# Patient Record
Sex: Female | Born: 1935 | Race: White | Hispanic: No | Smoking: Former smoker
Health system: Southern US, Community
[De-identification: ages and names within clinical notes are randomized; demographics above are authoritative.]

## PROBLEM LIST (undated history)

## (undated) DIAGNOSIS — K219 Gastro-esophageal reflux disease without esophagitis: Secondary | ICD-10-CM

## (undated) DIAGNOSIS — T8859XA Other complications of anesthesia, initial encounter: Secondary | ICD-10-CM

## (undated) DIAGNOSIS — E119 Type 2 diabetes mellitus without complications: Secondary | ICD-10-CM

## (undated) DIAGNOSIS — R911 Solitary pulmonary nodule: Secondary | ICD-10-CM

## (undated) DIAGNOSIS — I519 Heart disease, unspecified: Secondary | ICD-10-CM

## (undated) DIAGNOSIS — Z89611 Acquired absence of right leg above knee: Secondary | ICD-10-CM

## (undated) DIAGNOSIS — R413 Other amnesia: Secondary | ICD-10-CM

## (undated) DIAGNOSIS — I70229 Atherosclerosis of native arteries of extremities with rest pain, unspecified extremity: Secondary | ICD-10-CM

## (undated) DIAGNOSIS — IMO0001 Reserved for inherently not codable concepts without codable children: Secondary | ICD-10-CM

## (undated) DIAGNOSIS — S32609A Unspecified fracture of unspecified ischium, initial encounter for closed fracture: Secondary | ICD-10-CM

## (undated) DIAGNOSIS — I480 Paroxysmal atrial fibrillation: Secondary | ICD-10-CM

## (undated) DIAGNOSIS — I701 Atherosclerosis of renal artery: Secondary | ICD-10-CM

## (undated) DIAGNOSIS — J90 Pleural effusion, not elsewhere classified: Secondary | ICD-10-CM

## (undated) DIAGNOSIS — J9610 Chronic respiratory failure, unspecified whether with hypoxia or hypercapnia: Secondary | ICD-10-CM

## (undated) DIAGNOSIS — I739 Peripheral vascular disease, unspecified: Secondary | ICD-10-CM

## (undated) DIAGNOSIS — D649 Anemia, unspecified: Secondary | ICD-10-CM

## (undated) DIAGNOSIS — I714 Abdominal aortic aneurysm, without rupture, unspecified: Secondary | ICD-10-CM

## (undated) DIAGNOSIS — E782 Mixed hyperlipidemia: Secondary | ICD-10-CM

## (undated) DIAGNOSIS — T4145XA Adverse effect of unspecified anesthetic, initial encounter: Secondary | ICD-10-CM

## (undated) DIAGNOSIS — T148XXA Other injury of unspecified body region, initial encounter: Secondary | ICD-10-CM

## (undated) DIAGNOSIS — I998 Other disorder of circulatory system: Secondary | ICD-10-CM

## (undated) DIAGNOSIS — N390 Urinary tract infection, site not specified: Secondary | ICD-10-CM

## (undated) DIAGNOSIS — Z9981 Dependence on supplemental oxygen: Secondary | ICD-10-CM

## (undated) DIAGNOSIS — S22000A Wedge compression fracture of unspecified thoracic vertebra, initial encounter for closed fracture: Secondary | ICD-10-CM

## (undated) DIAGNOSIS — Z7901 Long term (current) use of anticoagulants: Secondary | ICD-10-CM

## (undated) DIAGNOSIS — I10 Essential (primary) hypertension: Secondary | ICD-10-CM

## (undated) DIAGNOSIS — M1990 Unspecified osteoarthritis, unspecified site: Secondary | ICD-10-CM

## (undated) DIAGNOSIS — J449 Chronic obstructive pulmonary disease, unspecified: Secondary | ICD-10-CM

## (undated) DIAGNOSIS — H3320 Serous retinal detachment, unspecified eye: Secondary | ICD-10-CM

## (undated) DIAGNOSIS — I5032 Chronic diastolic (congestive) heart failure: Secondary | ICD-10-CM

## (undated) DIAGNOSIS — J189 Pneumonia, unspecified organism: Secondary | ICD-10-CM

## (undated) DIAGNOSIS — I2510 Atherosclerotic heart disease of native coronary artery without angina pectoris: Secondary | ICD-10-CM

## (undated) HISTORY — PX: AORTO-FEMORAL BYPASS GRAFT: SHX885

## (undated) HISTORY — DX: Other injury of unspecified body region, initial encounter: T14.8XXA

## (undated) HISTORY — DX: Serous retinal detachment, unspecified eye: H33.20

## (undated) HISTORY — DX: Anemia, unspecified: D64.9

## (undated) HISTORY — DX: Unspecified fracture of unspecified ischium, initial encounter for closed fracture: S32.609A

## (undated) HISTORY — PX: VASCULAR SURGERY: SHX849

## (undated) HISTORY — PX: CHOLECYSTECTOMY: SHX55

## (undated) HISTORY — DX: Type 2 diabetes mellitus without complications: E11.9

## (undated) HISTORY — DX: Acquired absence of right leg above knee: Z89.611

## (undated) HISTORY — DX: Solitary pulmonary nodule: R91.1

## (undated) HISTORY — PX: FRACTURE SURGERY: SHX138

## (undated) HISTORY — PX: PATELLA FRACTURE SURGERY: SHX735

## (undated) HISTORY — DX: Heart disease, unspecified: I51.9

## (undated) HISTORY — PX: TOTAL ABDOMINAL HYSTERECTOMY: SHX209

## (undated) HISTORY — DX: Wedge compression fracture of unspecified thoracic vertebra, initial encounter for closed fracture: S22.000A

## (undated) HISTORY — DX: Abdominal aortic aneurysm, without rupture, unspecified: I71.40

## (undated) HISTORY — DX: Atherosclerotic heart disease of native coronary artery without angina pectoris: I25.10

## (undated) HISTORY — DX: Other disorder of circulatory system: I99.8

## (undated) HISTORY — PX: FEMORAL-PERONEAL BYPASS GRAFT: SHX5163

## (undated) HISTORY — DX: Atherosclerosis of native arteries of extremities with rest pain, unspecified extremity: I70.229

## (undated) HISTORY — PX: RETINAL DETACHMENT SURGERY: SHX105

## (undated) HISTORY — DX: Peripheral vascular disease, unspecified: I73.9

## (undated) HISTORY — PX: CARDIAC CATHETERIZATION: SHX172

## (undated) HISTORY — DX: Atherosclerosis of renal artery: I70.1

## (undated) HISTORY — DX: Other amnesia: R41.3

## (undated) HISTORY — DX: Abdominal aortic aneurysm, without rupture: I71.4

## (undated) HISTORY — DX: Mixed hyperlipidemia: E78.2

## (undated) HISTORY — PX: EYE SURGERY: SHX253

---

## 1996-02-18 HISTORY — PX: ABDOMINAL AORTIC ANEURYSM REPAIR: SUR1152

## 1997-06-07 ENCOUNTER — Inpatient Hospital Stay (HOSPITAL_COMMUNITY): Admission: RE | Admit: 1997-06-07 | Discharge: 1997-06-13 | Payer: Self-pay | Admitting: *Deleted

## 1997-06-17 ENCOUNTER — Inpatient Hospital Stay (HOSPITAL_COMMUNITY): Admission: EM | Admit: 1997-06-17 | Discharge: 1997-06-24 | Payer: Self-pay | Admitting: Emergency Medicine

## 1997-06-29 ENCOUNTER — Inpatient Hospital Stay (HOSPITAL_COMMUNITY): Admission: EM | Admit: 1997-06-29 | Discharge: 1997-07-05 | Payer: Self-pay | Admitting: Emergency Medicine

## 1998-02-02 ENCOUNTER — Ambulatory Visit: Admission: RE | Admit: 1998-02-02 | Discharge: 1998-02-02 | Payer: Self-pay | Admitting: Cardiovascular Disease

## 1998-04-24 ENCOUNTER — Encounter: Admission: RE | Admit: 1998-04-24 | Discharge: 1998-07-23 | Payer: Self-pay | Admitting: Emergency Medicine

## 1999-06-20 ENCOUNTER — Encounter: Admission: RE | Admit: 1999-06-20 | Discharge: 1999-06-20 | Payer: Self-pay | Admitting: Emergency Medicine

## 1999-06-20 ENCOUNTER — Encounter: Payer: Self-pay | Admitting: Emergency Medicine

## 2000-06-22 ENCOUNTER — Encounter: Payer: Self-pay | Admitting: Emergency Medicine

## 2000-06-22 ENCOUNTER — Encounter: Admission: RE | Admit: 2000-06-22 | Discharge: 2000-06-22 | Payer: Self-pay | Admitting: Emergency Medicine

## 2000-07-27 ENCOUNTER — Encounter: Payer: Self-pay | Admitting: Emergency Medicine

## 2000-07-27 ENCOUNTER — Encounter: Admission: RE | Admit: 2000-07-27 | Discharge: 2000-07-27 | Payer: Self-pay | Admitting: Emergency Medicine

## 2000-10-27 ENCOUNTER — Ambulatory Visit (HOSPITAL_COMMUNITY): Admission: RE | Admit: 2000-10-27 | Discharge: 2000-10-27 | Payer: Self-pay | Admitting: Gastroenterology

## 2001-06-23 ENCOUNTER — Encounter: Admission: RE | Admit: 2001-06-23 | Discharge: 2001-06-23 | Payer: Self-pay | Admitting: Emergency Medicine

## 2001-06-23 ENCOUNTER — Encounter: Payer: Self-pay | Admitting: Emergency Medicine

## 2002-06-28 ENCOUNTER — Encounter: Payer: Self-pay | Admitting: Emergency Medicine

## 2002-06-28 ENCOUNTER — Encounter: Admission: RE | Admit: 2002-06-28 | Discharge: 2002-06-28 | Payer: Self-pay | Admitting: Emergency Medicine

## 2002-12-16 ENCOUNTER — Ambulatory Visit (HOSPITAL_COMMUNITY): Admission: RE | Admit: 2002-12-16 | Discharge: 2002-12-16 | Payer: Self-pay | Admitting: Emergency Medicine

## 2003-07-05 ENCOUNTER — Encounter: Admission: RE | Admit: 2003-07-05 | Discharge: 2003-07-05 | Payer: Self-pay | Admitting: Emergency Medicine

## 2004-07-10 ENCOUNTER — Encounter: Admission: RE | Admit: 2004-07-10 | Discharge: 2004-07-10 | Payer: Self-pay | Admitting: Emergency Medicine

## 2004-07-18 ENCOUNTER — Encounter: Admission: RE | Admit: 2004-07-18 | Discharge: 2004-07-18 | Payer: Self-pay | Admitting: Emergency Medicine

## 2004-08-30 ENCOUNTER — Encounter (HOSPITAL_BASED_OUTPATIENT_CLINIC_OR_DEPARTMENT_OTHER): Admission: RE | Admit: 2004-08-30 | Discharge: 2004-11-28 | Payer: Self-pay | Admitting: Surgery

## 2004-12-16 ENCOUNTER — Encounter (HOSPITAL_BASED_OUTPATIENT_CLINIC_OR_DEPARTMENT_OTHER): Admission: RE | Admit: 2004-12-16 | Discharge: 2004-12-26 | Payer: Self-pay | Admitting: Surgery

## 2005-01-15 ENCOUNTER — Encounter (HOSPITAL_BASED_OUTPATIENT_CLINIC_OR_DEPARTMENT_OTHER): Admission: RE | Admit: 2005-01-15 | Discharge: 2005-01-22 | Payer: Self-pay | Admitting: Surgery

## 2005-02-17 DIAGNOSIS — H3320 Serous retinal detachment, unspecified eye: Secondary | ICD-10-CM

## 2005-02-17 HISTORY — DX: Serous retinal detachment, unspecified eye: H33.20

## 2005-06-12 ENCOUNTER — Encounter (HOSPITAL_BASED_OUTPATIENT_CLINIC_OR_DEPARTMENT_OTHER): Admission: RE | Admit: 2005-06-12 | Discharge: 2005-06-13 | Payer: Self-pay | Admitting: Internal Medicine

## 2005-07-21 ENCOUNTER — Encounter: Admission: RE | Admit: 2005-07-21 | Discharge: 2005-07-21 | Payer: Self-pay | Admitting: Emergency Medicine

## 2006-06-18 ENCOUNTER — Emergency Department (HOSPITAL_COMMUNITY): Admission: EM | Admit: 2006-06-18 | Discharge: 2006-06-19 | Payer: Self-pay | Admitting: Emergency Medicine

## 2006-06-18 HISTORY — PX: CATARACT EXTRACTION: SUR2

## 2006-06-25 ENCOUNTER — Ambulatory Visit: Payer: Self-pay | Admitting: *Deleted

## 2006-07-23 ENCOUNTER — Encounter: Admission: RE | Admit: 2006-07-23 | Discharge: 2006-07-23 | Payer: Self-pay | Admitting: Emergency Medicine

## 2006-08-05 ENCOUNTER — Ambulatory Visit: Payer: Self-pay | Admitting: Vascular Surgery

## 2006-10-16 ENCOUNTER — Ambulatory Visit (HOSPITAL_COMMUNITY): Admission: RE | Admit: 2006-10-16 | Discharge: 2006-10-16 | Payer: Self-pay | Admitting: Cardiovascular Disease

## 2006-10-30 ENCOUNTER — Ambulatory Visit (HOSPITAL_COMMUNITY): Admission: RE | Admit: 2006-10-30 | Discharge: 2006-10-30 | Payer: Self-pay | Admitting: Cardiovascular Disease

## 2006-12-31 ENCOUNTER — Ambulatory Visit: Payer: Self-pay | Admitting: *Deleted

## 2007-02-18 DIAGNOSIS — T148XXA Other injury of unspecified body region, initial encounter: Secondary | ICD-10-CM

## 2007-02-18 HISTORY — DX: Other injury of unspecified body region, initial encounter: T14.8XXA

## 2007-04-20 ENCOUNTER — Encounter: Admission: RE | Admit: 2007-04-20 | Discharge: 2007-04-20 | Payer: Self-pay | Admitting: Cardiovascular Disease

## 2007-05-07 ENCOUNTER — Encounter: Admission: RE | Admit: 2007-05-07 | Discharge: 2007-05-07 | Payer: Self-pay | Admitting: Emergency Medicine

## 2007-07-15 ENCOUNTER — Ambulatory Visit: Payer: Self-pay | Admitting: *Deleted

## 2007-07-26 ENCOUNTER — Encounter: Admission: RE | Admit: 2007-07-26 | Discharge: 2007-07-26 | Payer: Self-pay | Admitting: Emergency Medicine

## 2007-08-29 ENCOUNTER — Emergency Department (HOSPITAL_COMMUNITY): Admission: EM | Admit: 2007-08-29 | Discharge: 2007-08-29 | Payer: Self-pay | Admitting: Emergency Medicine

## 2007-12-23 ENCOUNTER — Ambulatory Visit: Payer: Self-pay | Admitting: *Deleted

## 2008-06-29 ENCOUNTER — Ambulatory Visit: Payer: Self-pay | Admitting: *Deleted

## 2008-07-28 ENCOUNTER — Encounter: Admission: RE | Admit: 2008-07-28 | Discharge: 2008-07-28 | Payer: Self-pay | Admitting: Emergency Medicine

## 2008-09-17 DIAGNOSIS — I5032 Chronic diastolic (congestive) heart failure: Secondary | ICD-10-CM

## 2008-09-17 HISTORY — DX: Chronic diastolic (congestive) heart failure: I50.32

## 2008-10-03 ENCOUNTER — Inpatient Hospital Stay (HOSPITAL_COMMUNITY): Admission: AD | Admit: 2008-10-03 | Discharge: 2008-10-13 | Payer: Self-pay | Admitting: Family Medicine

## 2008-10-03 ENCOUNTER — Ambulatory Visit: Payer: Self-pay | Admitting: Family Medicine

## 2008-10-05 ENCOUNTER — Encounter: Payer: Self-pay | Admitting: Family Medicine

## 2008-10-12 DIAGNOSIS — I2510 Atherosclerotic heart disease of native coronary artery without angina pectoris: Secondary | ICD-10-CM

## 2008-10-12 HISTORY — DX: Atherosclerotic heart disease of native coronary artery without angina pectoris: I25.10

## 2008-12-22 ENCOUNTER — Ambulatory Visit: Payer: Self-pay | Admitting: Vascular Surgery

## 2009-03-15 ENCOUNTER — Encounter: Admission: RE | Admit: 2009-03-15 | Discharge: 2009-03-15 | Payer: Self-pay | Admitting: Emergency Medicine

## 2009-06-29 ENCOUNTER — Encounter: Admission: RE | Admit: 2009-06-29 | Discharge: 2009-06-29 | Payer: Self-pay | Admitting: Gastroenterology

## 2009-07-05 ENCOUNTER — Ambulatory Visit: Payer: Self-pay | Admitting: Family Medicine

## 2009-07-05 ENCOUNTER — Inpatient Hospital Stay (HOSPITAL_COMMUNITY): Admission: EM | Admit: 2009-07-05 | Discharge: 2009-07-06 | Payer: Self-pay | Admitting: Emergency Medicine

## 2009-07-30 ENCOUNTER — Encounter: Admission: RE | Admit: 2009-07-30 | Discharge: 2009-07-30 | Payer: Self-pay | Admitting: Emergency Medicine

## 2009-07-30 LAB — HM DEXA SCAN

## 2009-08-31 ENCOUNTER — Ambulatory Visit: Payer: Self-pay | Admitting: Vascular Surgery

## 2009-12-27 ENCOUNTER — Encounter: Admission: RE | Admit: 2009-12-27 | Discharge: 2009-12-27 | Payer: Self-pay | Admitting: Emergency Medicine

## 2010-02-26 ENCOUNTER — Ambulatory Visit: Admit: 2010-02-26 | Payer: Self-pay | Admitting: Vascular Surgery

## 2010-03-10 ENCOUNTER — Encounter: Payer: Self-pay | Admitting: Emergency Medicine

## 2010-05-06 LAB — CBC
HCT: 33.5 % — ABNORMAL LOW (ref 36.0–46.0)
Hemoglobin: 11.5 g/dL — ABNORMAL LOW (ref 12.0–15.0)
Hemoglobin: 13 g/dL (ref 12.0–15.0)
MCHC: 34 g/dL (ref 30.0–36.0)
MCHC: 34.1 g/dL (ref 30.0–36.0)
MCV: 90.4 fL (ref 78.0–100.0)
Platelets: 153 10*3/uL (ref 150–400)
Platelets: 176 10*3/uL (ref 150–400)
RBC: 3.72 MIL/uL — ABNORMAL LOW (ref 3.87–5.11)
RDW: 13.9 % (ref 11.5–15.5)
RDW: 14.2 % (ref 11.5–15.5)
RDW: 14.6 % (ref 11.5–15.5)
WBC: 5.8 10*3/uL (ref 4.0–10.5)

## 2010-05-06 LAB — URINALYSIS, ROUTINE W REFLEX MICROSCOPIC
Hgb urine dipstick: NEGATIVE
Nitrite: NEGATIVE
Specific Gravity, Urine: 1.012 (ref 1.005–1.030)
pH: 6 (ref 5.0–8.0)

## 2010-05-06 LAB — GLUCOSE, CAPILLARY
Glucose-Capillary: 101 mg/dL — ABNORMAL HIGH (ref 70–99)
Glucose-Capillary: 167 mg/dL — ABNORMAL HIGH (ref 70–99)
Glucose-Capillary: 65 mg/dL — ABNORMAL LOW (ref 70–99)

## 2010-05-06 LAB — COMPREHENSIVE METABOLIC PANEL
ALT: 31 U/L (ref 0–35)
ALT: 31 U/L (ref 0–35)
Albumin: 3.2 g/dL — ABNORMAL LOW (ref 3.5–5.2)
Alkaline Phosphatase: 45 U/L (ref 39–117)
BUN: 37 mg/dL — ABNORMAL HIGH (ref 6–23)
BUN: 50 mg/dL — ABNORMAL HIGH (ref 6–23)
Calcium: 8 mg/dL — ABNORMAL LOW (ref 8.4–10.5)
Chloride: 116 mEq/L — ABNORMAL HIGH (ref 96–112)
Glucose, Bld: 82 mg/dL (ref 70–99)
Glucose, Bld: 94 mg/dL (ref 70–99)
Potassium: 4.1 mEq/L (ref 3.5–5.1)
Sodium: 136 mEq/L (ref 135–145)
Sodium: 141 mEq/L (ref 135–145)
Total Bilirubin: 0.4 mg/dL (ref 0.3–1.2)
Total Protein: 5.5 g/dL — ABNORMAL LOW (ref 6.0–8.3)
Total Protein: 6.4 g/dL (ref 6.0–8.3)

## 2010-05-06 LAB — CK TOTAL AND CKMB (NOT AT ARMC)
CK, MB: 16.4 ng/mL (ref 0.3–4.0)
CK, MB: 20.9 ng/mL (ref 0.3–4.0)
Relative Index: 8.2 — ABNORMAL HIGH (ref 0.0–2.5)
Relative Index: 8.4 — ABNORMAL HIGH (ref 0.0–2.5)
Total CK: 250 U/L — ABNORMAL HIGH (ref 7–177)

## 2010-05-06 LAB — PROTIME-INR
Prothrombin Time: 23.5 seconds — ABNORMAL HIGH (ref 11.6–15.2)
Prothrombin Time: 23.7 seconds — ABNORMAL HIGH (ref 11.6–15.2)

## 2010-05-06 LAB — DIFFERENTIAL
Basophils Absolute: 0 10*3/uL (ref 0.0–0.1)
Basophils Relative: 0 % (ref 0–1)
Eosinophils Absolute: 0 10*3/uL (ref 0.0–0.7)
Eosinophils Relative: 1 % (ref 0–5)
Lymphs Abs: 1.4 10*3/uL (ref 0.7–4.0)
Monocytes Absolute: 0.6 10*3/uL (ref 0.1–1.0)
Monocytes Relative: 9 % (ref 3–12)
Neutro Abs: 4.4 10*3/uL (ref 1.7–7.7)
Neutrophils Relative %: 68 % (ref 43–77)

## 2010-05-06 LAB — BASIC METABOLIC PANEL
BUN: 17 mg/dL (ref 6–23)
CO2: 22 mEq/L (ref 19–32)
Calcium: 8.2 mg/dL — ABNORMAL LOW (ref 8.4–10.5)
Chloride: 112 mEq/L (ref 96–112)
Creatinine, Ser: 0.92 mg/dL (ref 0.4–1.2)
Glucose, Bld: 129 mg/dL — ABNORMAL HIGH (ref 70–99)

## 2010-05-06 LAB — CARDIAC PANEL(CRET KIN+CKTOT+MB+TROPI)
CK, MB: 28.5 ng/mL (ref 0.3–4.0)
Relative Index: 7.6 — ABNORMAL HIGH (ref 0.0–2.5)
Relative Index: 8.2 — ABNORMAL HIGH (ref 0.0–2.5)
Total CK: 377 U/L — ABNORMAL HIGH (ref 7–177)

## 2010-05-06 LAB — TSH: TSH: 2.554 u[IU]/mL (ref 0.350–4.500)

## 2010-05-06 LAB — TROPONIN I: Troponin I: 0.04 ng/mL (ref 0.00–0.06)

## 2010-05-06 LAB — LIPASE, BLOOD: Lipase: 41 U/L (ref 11–59)

## 2010-05-06 LAB — CREATININE, URINE, RANDOM: Creatinine, Urine: 36.3 mg/dL

## 2010-05-06 LAB — POCT CARDIAC MARKERS: CKMB, poc: 9.5 ng/mL (ref 1.0–8.0)

## 2010-05-25 LAB — GLUCOSE, CAPILLARY
Glucose-Capillary: 100 mg/dL — ABNORMAL HIGH (ref 70–99)
Glucose-Capillary: 101 mg/dL — ABNORMAL HIGH (ref 70–99)
Glucose-Capillary: 105 mg/dL — ABNORMAL HIGH (ref 70–99)
Glucose-Capillary: 109 mg/dL — ABNORMAL HIGH (ref 70–99)
Glucose-Capillary: 113 mg/dL — ABNORMAL HIGH (ref 70–99)
Glucose-Capillary: 118 mg/dL — ABNORMAL HIGH (ref 70–99)
Glucose-Capillary: 128 mg/dL — ABNORMAL HIGH (ref 70–99)
Glucose-Capillary: 131 mg/dL — ABNORMAL HIGH (ref 70–99)
Glucose-Capillary: 135 mg/dL — ABNORMAL HIGH (ref 70–99)
Glucose-Capillary: 136 mg/dL — ABNORMAL HIGH (ref 70–99)
Glucose-Capillary: 137 mg/dL — ABNORMAL HIGH (ref 70–99)
Glucose-Capillary: 138 mg/dL — ABNORMAL HIGH (ref 70–99)
Glucose-Capillary: 148 mg/dL — ABNORMAL HIGH (ref 70–99)
Glucose-Capillary: 162 mg/dL — ABNORMAL HIGH (ref 70–99)
Glucose-Capillary: 170 mg/dL — ABNORMAL HIGH (ref 70–99)
Glucose-Capillary: 172 mg/dL — ABNORMAL HIGH (ref 70–99)
Glucose-Capillary: 175 mg/dL — ABNORMAL HIGH (ref 70–99)
Glucose-Capillary: 181 mg/dL — ABNORMAL HIGH (ref 70–99)
Glucose-Capillary: 185 mg/dL — ABNORMAL HIGH (ref 70–99)
Glucose-Capillary: 190 mg/dL — ABNORMAL HIGH (ref 70–99)
Glucose-Capillary: 207 mg/dL — ABNORMAL HIGH (ref 70–99)
Glucose-Capillary: 97 mg/dL (ref 70–99)

## 2010-05-25 LAB — BASIC METABOLIC PANEL
BUN: 25 mg/dL — ABNORMAL HIGH (ref 6–23)
BUN: 27 mg/dL — ABNORMAL HIGH (ref 6–23)
BUN: 42 mg/dL — ABNORMAL HIGH (ref 6–23)
BUN: 51 mg/dL — ABNORMAL HIGH (ref 6–23)
BUN: 75 mg/dL — ABNORMAL HIGH (ref 6–23)
CO2: 31 mEq/L (ref 19–32)
CO2: 31 mEq/L (ref 19–32)
CO2: 32 mEq/L (ref 19–32)
CO2: 32 mEq/L (ref 19–32)
CO2: 33 mEq/L — ABNORMAL HIGH (ref 19–32)
CO2: 33 mEq/L — ABNORMAL HIGH (ref 19–32)
Calcium: 9.4 mg/dL (ref 8.4–10.5)
Calcium: 9.4 mg/dL (ref 8.4–10.5)
Chloride: 100 mEq/L (ref 96–112)
Chloride: 103 mEq/L (ref 96–112)
Chloride: 106 mEq/L (ref 96–112)
Chloride: 95 mEq/L — ABNORMAL LOW (ref 96–112)
Chloride: 95 mEq/L — ABNORMAL LOW (ref 96–112)
Chloride: 97 mEq/L (ref 96–112)
Chloride: 98 mEq/L (ref 96–112)
Creatinine, Ser: 1.07 mg/dL (ref 0.4–1.2)
Creatinine, Ser: 1.14 mg/dL (ref 0.4–1.2)
Creatinine, Ser: 1.42 mg/dL — ABNORMAL HIGH (ref 0.4–1.2)
Creatinine, Ser: 2.05 mg/dL — ABNORMAL HIGH (ref 0.4–1.2)
GFR calc Af Amer: 37 mL/min — ABNORMAL LOW (ref >60)
GFR calc Af Amer: 50 mL/min — ABNORMAL LOW (ref >60)
GFR calc Af Amer: 59 mL/min — ABNORMAL LOW (ref >60)
GFR calc Af Amer: >60 mL/min (ref >60)
GFR calc non Af Amer: 42 mL/min — ABNORMAL LOW (ref >60)
GFR calc non Af Amer: 44 mL/min — ABNORMAL LOW (ref >60)
GFR calc non Af Amer: 45 mL/min — ABNORMAL LOW (ref >60)
GFR calc non Af Amer: 51 mL/min — ABNORMAL LOW (ref >60)
Glucose, Bld: 108 mg/dL — ABNORMAL HIGH (ref 70–99)
Glucose, Bld: 140 mg/dL — ABNORMAL HIGH (ref 70–99)
Glucose, Bld: 140 mg/dL — ABNORMAL HIGH (ref 70–99)
Glucose, Bld: 141 mg/dL — ABNORMAL HIGH (ref 70–99)
Potassium: 3.5 mEq/L (ref 3.5–5.1)
Potassium: 3.7 mEq/L (ref 3.5–5.1)
Potassium: 3.7 mEq/L (ref 3.5–5.1)
Potassium: 3.7 mEq/L (ref 3.5–5.1)
Potassium: 3.9 mEq/L (ref 3.5–5.1)
Potassium: 4.1 mEq/L (ref 3.5–5.1)
Potassium: 4.4 mEq/L (ref 3.5–5.1)
Sodium: 137 mEq/L (ref 135–145)
Sodium: 138 mEq/L (ref 135–145)
Sodium: 139 mEq/L (ref 135–145)
Sodium: 140 mEq/L (ref 135–145)
Sodium: 140 mEq/L (ref 135–145)
Sodium: 141 mEq/L (ref 135–145)
Sodium: 143 mEq/L (ref 135–145)

## 2010-05-25 LAB — COMPREHENSIVE METABOLIC PANEL
AST: 30 U/L (ref 0–37)
Albumin: 3 g/dL — ABNORMAL LOW (ref 3.5–5.2)
Chloride: 109 mEq/L (ref 96–112)
Creatinine, Ser: 1.34 mg/dL — ABNORMAL HIGH (ref 0.4–1.2)
GFR calc Af Amer: 46 mL/min — ABNORMAL LOW (ref >60)
Sodium: 143 mEq/L (ref 135–145)
Total Bilirubin: 0.5 mg/dL (ref 0.3–1.2)

## 2010-05-25 LAB — CBC
HCT: 32.1 % — ABNORMAL LOW (ref 36.0–46.0)
HCT: 32.3 % — ABNORMAL LOW (ref 36.0–46.0)
HCT: 36.4 % (ref 36.0–46.0)
HCT: 37.7 % (ref 36.0–46.0)
HCT: 38.9 % (ref 36.0–46.0)
Hemoglobin: 11.1 g/dL — ABNORMAL LOW (ref 12.0–15.0)
Hemoglobin: 12.2 g/dL (ref 12.0–15.0)
Hemoglobin: 12.7 g/dL (ref 12.0–15.0)
Hemoglobin: 12.8 g/dL (ref 12.0–15.0)
MCHC: 33 g/dL (ref 30.0–36.0)
MCHC: 33.3 g/dL (ref 30.0–36.0)
MCHC: 33.6 g/dL (ref 30.0–36.0)
MCHC: 33.6 g/dL (ref 30.0–36.0)
MCHC: 33.7 g/dL (ref 30.0–36.0)
MCHC: 33.8 g/dL (ref 30.0–36.0)
MCV: 94 fL (ref 78.0–100.0)
MCV: 94 fL (ref 78.0–100.0)
MCV: 94.4 fL (ref 78.0–100.0)
MCV: 94.4 fL (ref 78.0–100.0)
MCV: 94.6 fL (ref 78.0–100.0)
MCV: 95 fL (ref 78.0–100.0)
Platelets: 196 10*3/uL (ref 150–400)
Platelets: 200 10*3/uL (ref 150–400)
Platelets: 223 10*3/uL (ref 150–400)
Platelets: 230 10*3/uL (ref 150–400)
RBC: 3.4 MIL/uL — ABNORMAL LOW (ref 3.87–5.11)
RBC: 3.4 MIL/uL — ABNORMAL LOW (ref 3.87–5.11)
RDW: 13.2 % (ref 11.5–15.5)
RDW: 13.3 % (ref 11.5–15.5)
RDW: 13.7 % (ref 11.5–15.5)
WBC: 5.4 10*3/uL (ref 4.0–10.5)
WBC: 6 10*3/uL (ref 4.0–10.5)
WBC: 7.4 10*3/uL (ref 4.0–10.5)
WBC: 7.4 10*3/uL (ref 4.0–10.5)
WBC: 7.9 10*3/uL (ref 4.0–10.5)

## 2010-05-25 LAB — DIFFERENTIAL
Basophils Absolute: 0.1 10*3/uL (ref 0.0–0.1)
Eosinophils Relative: 3 % (ref 0–5)
Lymphocytes Relative: 32 % (ref 12–46)
Lymphs Abs: 1.7 10*3/uL (ref 0.7–4.0)
Monocytes Absolute: 0.5 10*3/uL (ref 0.1–1.0)

## 2010-05-25 LAB — UIFE/LIGHT CHAINS/TP QN, 24-HR UR
Alpha 1, Urine: DETECTED — AB
Free Kappa Lt Chains,Ur: 1.77 mg/dL — ABNORMAL HIGH (ref 0.04–1.51)
Free Lambda Excretion/Day: 2.04 mg/d
Free Lambda Lt Chains,Ur: 0.11 mg/dL (ref 0.08–1.01)
Gamma Globulin, Urine: DETECTED — AB
Time: 24 hours
Total Protein, Urine-Ur/day: 198 mg/d — ABNORMAL HIGH (ref 10–140)

## 2010-05-25 LAB — PROTIME-INR
INR: 1.1 (ref 0.00–1.49)
INR: 1.1 (ref 0.00–1.49)
INR: 1.1 (ref 0.00–1.49)
INR: 1.2 (ref 0.00–1.49)
Prothrombin Time: 14 seconds (ref 11.6–15.2)
Prothrombin Time: 14.5 seconds (ref 11.6–15.2)

## 2010-05-25 LAB — APTT: aPTT: 75 seconds — ABNORMAL HIGH (ref 24–37)

## 2010-05-25 LAB — BRAIN NATRIURETIC PEPTIDE
Pro B Natriuretic peptide (BNP): 419 pg/mL — ABNORMAL HIGH (ref 0.0–100.0)
Pro B Natriuretic peptide (BNP): 566 pg/mL — ABNORMAL HIGH (ref 0.0–100.0)
Pro B Natriuretic peptide (BNP): 781 pg/mL — ABNORMAL HIGH (ref 0.0–100.0)

## 2010-07-01 ENCOUNTER — Other Ambulatory Visit: Payer: Self-pay | Admitting: Emergency Medicine

## 2010-07-01 DIAGNOSIS — Z1231 Encounter for screening mammogram for malignant neoplasm of breast: Secondary | ICD-10-CM

## 2010-07-02 NOTE — Procedures (Signed)
BYPASS GRAFT EVALUATION   INDICATION:  Follow-up right femoral-to-ATA bypass graft.   HISTORY:  Diabetes:  Yes.  Cardiac:  No.  Hypertension:  Yes.  Smoking:  Quit.  Previous Surgery:  Please see above.   SINGLE LEVEL ARTERIAL EXAM                               RIGHT              LEFT  Brachial:                    157                159  Anterior tibial:             94                 114  Posterior tibial:            108                101  Peroneal:  Ankle/brachial index:        0.68               0.72   PREVIOUS ABI:  Date: 07/15/2007  RIGHT:  0.67  LEFT:  0.52   LOWER EXTREMITY BYPASS GRAFT DUPLEX EXAM:   DUPLEX:  Patent left femoral-to-ATA bypass graft with no evidence of  focal stenosis.   IMPRESSION:  1. Patent left femoral-to-anterior tibial artery  bypass with no      evidence of focal stenosis.  2. Moderately abnormal ABI with monophasic Doppler waveform noted in      bilateral legs.  3. Status post left femoral-to-anterior tibial artery bypass graft.   ___________________________________________  P. Liliane Bade, M.D.   MG/MEDQ  D:  12/23/2007  T:  12/23/2007  Job:  [id number]

## 2010-07-02 NOTE — Procedures (Signed)
BYPASS GRAFT EVALUATION   INDICATION:  Followup evaluation of right fem anterior tibial artery  bypass graft.  Patient complains of bilateral lower extremity weakness  which has occurred for years   HISTORY:  Diabetes:  Yes  Cardiac:  No  Hypertension:  Yes  Smoking:  Quit 10 years ago  Previous Surgery:  Right Fem-ATA BPG with Gore-Tex on 06/29/1997 by Dr.  Madilyn Fireman.  Many revisions have been done since.  Left iliac artery stent by  Dr. Allyson Sabal      SINGLE LEVEL ARTERIAL EXAM                               RIGHT              LEFT  Brachial:                    132                138  Anterior tibial:             80                 86  Posterior tibial:            64                 66  Peroneal:  Ankle/brachial index:        0.58               0.63   PREVIOUS ABI:  Date: 06/25/06  RIGHT:  0.59  LEFT:  0.64   LOWER EXTREMITY BYPASS GRAFT DUPLEX EXAM:   DUPLEX:  1. Right CFA patent without evidence of stenosis  2. Patent right lower extremity bypass graft without evidence of      stenosis with velocities ranging from 25 cm /s to 69 cm/s .  3. A 50% -79% stenosis in Native ATA approximately 2.31 cm past distal      anastomosis   IMPRESSION:  1. ABIs stable bilaterally.  2. Patent right lower extremity bypass graft without evidence of      stenosis.  3. A 50% -79% stenosis in Native ATA approximately 2.31 cm past distal      anastomosis.   ___________________________________________  P. Liliane Bade, M.D.   PB/MEDQ  D:  12/31/2006  T:  01/01/2007  Job:  [id number]

## 2010-07-02 NOTE — Discharge Summary (Signed)
Suzanne [name], QUESENBERRY NO.:  [id number]   MEDICAL RECORD NO.:  [id number]          PATIENT TYPE:  INP   LOCATION:  4734                         FACILITY:  [facility]   PHYSICIAN:  Nanetta Batty, M.D.   DATE OF BIRTH:  1921-08-07   DATE OF ADMISSION:  10/03/2008  DATE OF DISCHARGE:  10/13/2008                               DISCHARGE SUMMARY   DISCHARGE DIAGNOSES:  1. Congestive heart failure, acute on chronic systolic failure,      improved at discharge, discharge weight is 73.6 kg.  2. Cardiomyopathy, ejection fraction this admission 20-25% which was      down from 40-45% in February 2010.  3. Moderate coronary artery disease by catheterization with 30% left      anterior descending, 50% right coronary artery, 40% circumflex.  4. Chronic obstructive pulmonary disease.  5. Renal insufficiency, creatinine 1.14 at discharge, her creatinine      peaked this admission at 2.0 with diuresis.  6. Treated hypertension.  7. Type 2 non-insulin-dependent diabetes.  8. Known peripheral vascular disease with previous bifemoral bypass      grafting in renal artery narrowing, the patient is on chronic      Coumadin for vascular disease.   HOSPITAL COURSE:  Suzanne [name] is an 77 year old female presented with  increasing dyspnea for the last couple of weeks.  She has baseline COPD.  Last assessment of her EF was by echo in February 2010 and her EF was 40-  45%.  She had been seen by Dr. Allyson Sabal in the past.  Renal Dopplers as an  outpatient showed 60-99% bilateral renal artery narrowing.  She was  admitted to telemetry and started on IV diuretics.  Her creatinine on  admission was 1.18.  Her BNP was 679.  She was actually admitted by Dr.  Leveda Anna in Teaching Service.  She was on Coumadin, her INR was 2.1 on  admission.  Her Coumadin was held.  Echocardiogram revealed new LV  dysfunction with an EF of 20-25%.  Unfortunately, her creatinine had  risen.  Her ACE inhibitor and diuretics were  held.  Dr. Allyson Sabal felt she  needed a diagnostic catheterization for her new LV dysfunction.  This  was done on October 12, 2008.  EF was 35-40% at cath.  There was no  severe coronary blockages.  Plan is for continued medical therapy.  Dr.  Allyson Sabal will see her in the office in followup.  We resumed her ARB at  discharge, but held off on her diuretic for now.  She has been  instructed on a low-sodium diet.  Coumadin will be resumed without  heparin or Lovenox crossover.   DISCHARGE MEDICATIONS:  Please see Med Rec sheet, we did hold off on her  diuretic.   DISCHARGE LABORATORY DATA:  Sodium is 141, potassium 4.4, BUN 27, and  creatinine 1.07.  White count 7.4, hemoglobin 10.9, hematocrit 32.3, and  platelets 197.  Chest x-ray on the 19th shows a right pleural effusion.  CT of the chest without contrast on the 17th showed ascending aortic  ectasia, moderate right layering  pleural effusion.  She does have 9 x 5  mm right middle lobe pulmonary nodule that will need followup and a T12  compression fracture.   DISPOSITION:  The patient is discharged in stable condition.   Her discharge medications were as follows:  1. Tylenol 2 q.4 p.r.n.  2. Aspirin 81 mg a day.  3. Toprol-XL 25 mg 1/2 tablet a day.  4. Actos 45 mg a day.  5. Boniva 2.5 mg monthly.  6. Combivent inhaler 1 puff q.i.d. p.r.n.  7. Crestor 20 mg a day.  8. Iron 65 mg a day.  9. Losartan 50 mg a day.  10.Nexium 40 mg a day.  11.Os-Cal twice daily.  12.Fish oil b.i.d.  13.Coumadin 2.5 mg a day.  14.For now, she will hold her Lasix.   DISPOSITION:  The patient is discharged in stable condition.  She will  need followup CT without contrast in 3-4 months.  She will need a  followup BMP in the office.  She will see Dr. Allyson Sabal in about a week or  two.      Abelino Derrick, P.A.      Nanetta Batty, M.D.  Electronically Signed    LKK/MEDQ  D:  10/13/2008  T:  10/14/2008  Job:  [id number]   cc:   [facility]

## 2010-07-02 NOTE — Procedures (Signed)
BYPASS GRAFT EVALUATION   INDICATION:  Followup evaluation of right lower extremity bypass graft.  The patient complains of bilateral claudication at 100 feet, left >  right.  The patient states her left leg has worsened.   HISTORY:  Diabetes:  Yes.  Cardiac:  No.  Hypertension:  Yes.  Smoking:  Quit.  Previous Surgery:  Right fem-ATA BPG with Gore-Tex on 06/29/1997 by Dr.  Madilyn Fireman.  Many revisions done since.  Left iliac stent by Dr. Allyson Sabal.   SINGLE LEVEL ARTERIAL EXAM                               RIGHT              LEFT  Brachial:                    180                178  Anterior tibial:             100                88  Posterior tibial:            120                94  Peroneal:  Ankle/brachial index:        0.67               0.52   PREVIOUS ABI:  Date:  12/31/2006  RIGHT:  0.58  LEFT:  0.63   LOWER EXTREMITY BYPASS GRAFT DUPLEX EXAM:   DUPLEX:     1. Patent right CFA without evidence of stenosis.     2. Patent right fem-ATA BPG with velocities ranging from 23 cm per        second - 56 cm per second without evidence of stenosis.     3. Patent distal native ATA, however, unable to obtain the increased        PSV of 216 cm/sec, approximately 2.3 cm past the distal        anastomosis as noted on previous study.   IMPRESSION:  1. Stable right ABI.  2. Mild drop in left ABI.  3. Patent right lower extremity bypass graft without evidence of      stenosis.  4. Patent distal native ATA, however, unable to obtain a 50-79%      stenosis approximately 2.31 cm past the distal anastomosis as noted      on previous study due to technical difficulty.     ___________________________________________  P. Liliane Bade, M.D.   PB/MEDQ  D:  07/15/2007  T:  07/15/2007  Job:  [id number]

## 2010-07-02 NOTE — Assessment & Plan Note (Signed)
OFFICE VISIT   AIREN, DALES  DOB:  Aug 26, 1921                                       08/31/2009  FAOZH#:[id number]   CHIEF COMPLAINT:  Follow-up right leg fem-pop bypass.   HISTORY OF PRESENT ILLNESS:  Patient is an 77 year old woman with a  right femoral anterior tibial bypass on 06/29/1997 by Dr. Madilyn Fireman.  She  follows up today with ABIs.  She has had no issues with either right or  left lower extremity except for that side that she has had a little bit  of heaviness in the left leg.  She has no night or rest pain, and she is  ambulating, doing all normal activities.  ABIs showed were 0.61 on the  right and 0.73 on the left, which were stable.  However, on her previous  study in November 2010, the graft was patent without evidence of  stenosis.   On physical exam, this is a well-developed, well-nourished woman in no  acute distress.  Her heart rate is 69.  Her blood pressure is 165/81.  Her sats are 97%.  She had right DP peroneal Doppler signal and a left  AT and peroneal Doppler signal.  She and no DP or PT on the left.  Both  feet were warm and pink.  The right looked slightly more well-perfused  than the left.   ASSESSMENT:  Stable ankle brachial indices, right femoropopliteal  bypass.   PLAN:  Have her follow up per protocol with ABIs.  We reviewed signs and  symptoms of ischemia.   Della Goo, PA-C   [place]. Edilia Bo, M.D.  Electronically Signed   RR/MEDQ  D:  08/31/2009  T:  08/31/2009  Job:  [id number]

## 2010-07-02 NOTE — Cardiovascular Report (Signed)
NAME:  Suzanne [name], Suzanne [name] NO.:  [id number]   MEDICAL RECORD NO.:  [id number]          PATIENT TYPE:  INP   LOCATION:  4734                         FACILITY:  [facility]   PHYSICIAN:  Nanetta Batty, M.D.   DATE OF BIRTH:  1921/06/05   DATE OF PROCEDURE:  DATE OF DISCHARGE:                            CARDIAC CATHETERIZATION   HISTORY OF PRESENT ILLNESS:  [name] is an 77 year old Caucasian  female who I have been taking care of for almost 20 years.  She has had  a history of aortobifemoral.  Her other problems include COPD.  She was  admitted with dyspnea and pleural effusion.  She has been on Coumadin  anticoagulation.  She was diuresed and treated aggressively for  bronchitis.  Two-D echo showed an EF in the 25% range, which was  significantly reduced from her prior transthoracic echo we have done  earlier this year.  Because of this, she presents now for diagnostic  coronary arteriography while her INR is subtherapeutic to define anatomy  to rule out ischemic etiology.   DESCRIPTION OF PROCEDURE:  The patient was brought to the Second Floor  [facility] Cardiac Cath Lab in a postabsorptive state.  Her left groin  was prepped and shaved in the usual sterile fashion.  Xylocaine 1% was  used for local anesthesia.  A 5-French sheath was inserted into the left  femoral artery using standard Seldinger technique.  The 5-French right  and left Judkins diagnostic catheter, as well as 5-French pigtail  catheter were used for selective coronary angiography and left  ventriculography respectively.  Visipaque dye was used for the entirety  of the case.  Retrograde aortic, left ventricular and pullback blood  pressures were recorded.   HEMODYNAMICS:  1. Aortic systolic pressure 171, diastolic pressure 70.  2. Left ventricular systolic pressure 173, and diastolic pressure 27.   SELECTIVE CORONARY ANGIOGRAPHY:  1. Left main normal.  2. LAD; LAD had 30% segmental proximal  stenosis.  3. Circumflex; the circumflex had 30% and 40% tandem mid stenosis in      the AV groove.  4. Right coronary artery; dominant with 50% mid and 60% stenosis of      the genu of the vessel.  5. Left ventriculography; RAO left ventriculogram was performed using      20 mL of Visipaque dye 10 mL per second.  The overall LVEF appeared      to be approximately 40% with moderate global hypokinesia.   IMPRESSION:  [name] has noncritical CAD with moderate LV dysfunction.  Continued medical therapy will be recommended.  She will be gently  hydrated.  She was treated with Mucomyst.  Renal function and BMP will  be closely followed.  A total of 55 mL of contrast was used during the  case.  The patient left the lab in stable condition.      Nanetta Batty, M.D.  Electronically Signed     JB/MEDQ  D:  10/12/2008  T:  10/12/2008  Job:  [id number]   cc:   Second Floor [facility]  [facility]  Brett Canales A. Cleta Alberts, M.D.

## 2010-07-02 NOTE — Procedures (Signed)
BYPASS GRAFT EVALUATION   INDICATION:  Followup right lower extremity bypass graft.   HISTORY:  Diabetes:  Yes.  Cardiac:  No.  Hypertension:  Yes.  Smoking:  Previous.  Previous Surgery:  Right femoral to anterior tibial artery bypass graft  on 06/30/2007 by Dr. Madilyn Fireman with revascularizations per the patient.   SINGLE LEVEL ARTERIAL EXAM                               RIGHT              LEFT  Brachial:                    126                138  Anterior tibial:             80                 101  Posterior tibial:                               90  Peroneal:                    84  Ankle/brachial index:        0.61               0.73   PREVIOUS ABI:  Date:  12/22/2008  RIGHT:  0.62  LEFT:  0.74   LOWER EXTREMITY BYPASS GRAFT DUPLEX EXAM:   DUPLEX:  Monophasic Doppler waveforms noted throughout the right lower  extremity bypass graft with no increase in velocities.   IMPRESSION:  1. Patent right femoral to anterior tibial artery bypass graft with no      evidence of stenosis.  2. Stable bilateral ankle brachial indices.         ___________________________________________  Larina Earthly, M.D.   CH/MEDQ  D:  08/31/2009  T:  08/31/2009  Job:  [id number]

## 2010-07-02 NOTE — Procedures (Signed)
BYPASS GRAFT EVALUATION   INDICATION:  Followup evaluation of right lower extremity femoral to  anterior tibial bypass graft.   HISTORY:  Diabetes:  Yes.  Cardiac:  No.  Hypertension:  Yes.  Smoking:  Quit.  Previous Surgery:  Right femoral to anterior tibial bypass graft with  Gore-Tex on 06/29/1997 by Dr. Madilyn Fireman.  Nine revisions done since then.   SINGLE LEVEL ARTERIAL EXAM                               RIGHT              LEFT  Brachial:                    166                153  Anterior tibial:             103                122  Posterior tibial:            41                 123  Peroneal:  Ankle/brachial index:        0.62               0.74   PREVIOUS ABI:  Date:  06/29/2008  RIGHT:  0.59  LEFT:  0.56   LOWER EXTREMITY BYPASS GRAFT DUPLEX EXAM:   DUPLEX:  The right femoral to anterior tibial bypass graft appears  patent, however, there are increased velocities at distal anastomosis of  142 cm/s.   IMPRESSION:  1. Patent right femoral to anterior tibial bypass graft with increased      velocities of 142 cm/s at distal anastomosis.  2. Stable ankle brachial indices bilaterally as compared to previous      study.        ___________________________________________  Larina Earthly, M.D.   CB/MEDQ  D:  12/22/2008  T:  12/22/2008  Job:  [id number]

## 2010-07-02 NOTE — H&P (Signed)
Suzanne [name], Suzanne [name] NO.:  [id number]   MEDICAL RECORD NO.:  [id number]          PATIENT TYPE:  INP   LOCATION:  4733                         FACILITY:  [facility]   PHYSICIAN:  Santiago Bumpers. Hensel, M.D.DATE OF BIRTH:  1921-07-09   DATE OF ADMISSION:  10/03/2008  DATE OF DISCHARGE:                              HISTORY & PHYSICAL   PRIMARY CARE PHYSICIAN:  Brett Canales A. Cleta Alberts, MD at [facility].   CHIEF COMPLAINT:  Shortness of breath.   HISTORY OF PRESENT ILLNESS:  This is an 77 year old female who presents  with progressive shortness of breath x6 weeks.  The patient has a  history of COPD with a baseline level of shortness of breath.  Two  months ago, she changed inhalers to Combivent which originally improved  her dyspnea.  Starting approximately 6 weeks ago, her shortness of  breath began to worsen until the point that currently she is unable to  walk across a room without stopping to catch her breath.  She denies  fevers, chest pain, headaches, or changes in her vision.  She endorses a  cough productive of thick yellow sputum, which has increased over the  past 6 weeks.  Chest x-ray at her primary care physician's office showed  a right-sided pleural effusion.  The patient endorses___152__ no known  sick contacts.  An echo from 2005 showed an ejection fraction of 52%.  The patient denies any new swelling in any extremities.   REVIEW OF SYSTEMS:  A 10-point review of systems was negative except as  noted in the HPI.   PAST MEDICAL HISTORY:  1. COPD.  2. Hypertension.  3. Diabetes.  4. Hyperlipidemia.  5. Chronic renal insufficiency.  6. Peripheral vascular disease.  7. AAA.   PAST SURGICAL HISTORY:  1. Aortobifem bypass.  2. Abdominal aortic aneurysm repair with graft.  3. Cataract surgery.   MEDICATIONS:  1. Actos.  2. Cozaar.  3. Crestor.  4. Coumadin.  5. Boniva.  6. Combivent.  7. Nexium.  8. Fish oil.  9. Iron.  10.Calcium with vitamin D.  11.Vitamin B6.   SOCIAL HISTORY:  The patient is an 77 year old retired widowed female  who lives alone.  She has close family support who frequently check on  her and with whom speaks to on a daily basis.  The patient is a prior  tobacco user.  Smoking approximately 1 pack a day for greater than 30  years.  The patient endorses occasional alcohol use and denies any  recreational drug use.   FAMILY HISTORY:  The patient has a family history positive for diabetes,  hypertension, and prostate cancer in her father.  Additionally, her  siblings have history of peripheral vascular disease.   PHYSICAL EXAMINATION:  GENERAL:  No apparent distress.  CV:  Regular rate and rhythm.  No bruits.  No murmurs, rubs, or gallops.  PULMONARY:  Decreased breath sounds over the right middle and lower  lobes, dullness to percussion over the right base, decreased tactile  fremitus on the right side.  There is some use of accessory muscles.  The patient  was able to speak in complete sentences.  ABDOMEN:  Soft, nontender, and nondistended with positive bowel sounds.  EXTREMITIES:  Bilateral radial pulses 1+.  Absent pulses bilaterally in  the dorsalis pedis and posterior tibialis pulses.  1+ edema bilaterally  in the lower extremities.   ASSESSMENT AND PLAN:  This is an 77 year old female who presents with  shortness of breath with new right pleural effusion.  Plan for  thoracentesis with fluid studies to guide workup.  1. Shortness of breath, pleural effusion, and chronic obstructive      pulmonary disease.  Chest x-ray showed a new right pleural      effusion.  The patient will need a thoracentesis, however, she is      currently on Coumadin.  At this point, we will get a chest CT, hold      the Coumadin, and check an INR.  If the patient's INR is elevated,      we will consider giving vitamin K and checking INR in the morning.      We will check a BNP.  We will get an echo, and we will send urine       for Bence Jones proteins.  A.m. labs include CBC, CMET, and a      PT/INR.  2. Diabetes.  The patient's diabetes is well controlled on Actos at      home.  Last hemoglobin A1c was less than 6.  During this admission,      the patient will be on sliding scale insulin, sensitive intensity      with a.c. and h.s. coverage.  3. Peripheral vascular disease.  The patient is on Coumadin at home      for mixed arterial and venous insufficiency.  We will hold Coumadin      for the thoracentesis.  We will bridge with heparin 5000 units      t.i.d.  4. Chronic renal insufficiency.  Last creatinine was 1.6 in June 2010.      We will hydrate the patient prior to her CT and we will hold the CT      for creatinine greater than 1.5.  The patient has an appointment as      an outpatient with nephrologist.  We will consider consult during      this admission.  5. Hyperlipidemia.  The patient's last fasting lipid panel was within      goal ranges.  We will continue treatment with Crestor.  6. Hypertension.  The patient's blood pressure is stable.  We will      continue Cozaar.  7. FEN/GI:  The patient may have a carbohydrate modified diet.  She      will receive half normal saline at 75 mL/h.  8. Prophylaxis.  Heparin 5000 units subcu t.i.d. and Protonix 40 mg.      Continue all other home medications.  9. Disposition, discharged to home.  Pending workup and treatment of      pleural effusion.      William A. Leveda Anna, M.D.  Electronically Signed    WAH/MEDQ  D:  10/03/2008  T:  10/04/2008  Job:  [id number]

## 2010-07-02 NOTE — Procedures (Signed)
BYPASS GRAFT EVALUATION   INDICATION:  Follow up evaluation of lower extremity bypass graft.   HISTORY:  Diabetes:  Yes.  Cardiac:  No.  Hypertension:  Yes.  Smoking:  Quit.  Previous Surgery:  Right fem to anterior tibial bypass graft with Bethanie Dicker on 06/29/1997 by Dr. Madilyn Fireman.  Many revisions done since.   SINGLE LEVEL ARTERIAL EXAM                               RIGHT              LEFT  Brachial:                    137                154  Anterior tibial:             91                 82  Posterior tibial:            51                 86  Peroneal:  Ankle/brachial index:        0.59               0.56   PREVIOUS ABI:  Date: 12/23/2007  RIGHT:  0.68  LEFT:  0.72   LOWER EXTREMITY BYPASS GRAFT DUPLEX EXAM:   DUPLEX:  1. Biphasic duplex waveform noted within graft and native artery.  2. Increased velocity of 199 cm/s noted at the distal native artery.   IMPRESSION:  1. Patent right femoral to anterior tibial bypass graft with no      evidence of focal stenosis.  2. Bilateral lower extremity ABIs suggest moderate to severe arterial      disease.   ___________________________________________  P. Liliane Bade, M.D.   AC/MEDQ  D:  06/29/2008  T:  06/29/2008  Job:  [id number]

## 2010-07-05 NOTE — Consult Note (Signed)
NAME:  Suzanne [name], Suzanne [name] NO.:  [id number]   MEDICAL RECORD NO.:  [id number]           PATIENT TYPE:   LOCATION:                                 FACILITY:   PHYSICIAN:  Theresia Majors. Tanda Rockers, M.D.     DATE OF BIRTH:   DATE OF CONSULTATION:  09/02/2004  DATE OF DISCHARGE:                                   CONSULTATION   REASON FOR CONSULTATION:  Suzanne [name] is an 77 year old lady referred for the  evaluation of a nonhealing ulcer on her left ankle.   IMPRESSION:  Combined arterial and stasis ulcer post-traumatic.   RECOMMENDATIONS:  Proceed with debridement and mild multilayer compression  with topical debriding agent. Follow the patient at weekly intervals to  assess her response to therapy. The patient may ultimately require a  comprehensive vascular evaluation which may include arteriography and  revascularization.   SUBJECTIVE:  Suzanne [name] is an 77 year old lady who noted a bump and a  breakdown of skin approximately 4 weeks ago. She has been treated with  various over-the-counter medications and there has not been significant  improvement. On the contrary, there has been some subtle deterioration with  increased drainage, redness, and pain. She was referred [facility].   PAST MEDICAL HISTORY:  Is remarkable for diabetes. She has had a previous  femoral to tibial bypass on the right and has been followed comprehensively  by Dr. Liliane Bade of [facility]. Her current medication list include allergies to  CODEINE and to __________. She is a hypertensive and takes Avapro 300 mg a  day, Avandia 8 mg a day, Crestor 20 mg at bedtime, Coumadin 2.5 mg a day,  Prevacid 30 mg twice a day, Lasix 20 mg a day, Atrovent inhalers two puffs a  day, iron 28 mg a day.   FAMILY HISTORY:  Is positive for hypertension and vascular disease.   SOCIALLY:  She is a retired widow, lives in [place] in close association  with her children.   REVIEW OF SYSTEMS:  Discloses that she is relatively active her diabetes.  Her diabetes is of the adult onset variant and has not been problematic in  the past. She specifically denies angina pectoris. She has had an abdominal  aortic aneurysm repaired electively without sequelae. She denies GI or GU  complaints.The remainder of the review of systems is negative.   PHYSICAL EXAMINATION:  GENERAL:  She is alert, oriented, in no acute  distress.  HEENT:  Exam was clear.  NECK:  Supple. Trachea is midline. Thyroid is nonpalpable.  LUNGS:  Clear.  ABDOMEN:  Soft with a well-healed midline incision.  EXTREMITIES:  The femoral pulses are faintly palpable bilaterally. There is  a bounding graft in the subcutaneous area on the anterolateral aspect of the  right lower extremity. There are no nutritional changes apparent in the  right lower extremity. On the left there is a frank ulceration over the  lateral malleolus with a thickened eschar and a moderate drainage. This  wound was debrided. Photographs were taken. There  are no pedal pulses in the  left lower extremity. There are marked changes of venous telangiectasias and  spider angiomata. Neurologically the patient's protective sensation is  preserved.  SKIN:  There no dominant skin lesions. There is no regional adenopathy.   DISCUSSION:  This patient's wound was debrided in [facility] and mild  compressive dressing applied to decrease the negative effects of the  moderate edema. We will see her in 1 week to assess her response to this  therapy. She may in fact require the aforementioned evaluation for arterial  disease. The patient does have an appointment be seen by Dr. Madilyn Fireman within  the next month, at which time we would anticipate that he would proceed with  reevaluation of the left lower extremity vasculature.           ______________________________  Theresia Majors Tanda Rockers, M.D.     Cephus Slater  D:  09/02/2004  T:  09/02/2004   Job:  [id number]   cc:   Balinda Quails, M.D.  [address]  [place]  [place] [address]   Stan Head. Cleta Alberts, M.D.  [address]  [place]  [place] [address]  Fax: [phone]

## 2010-07-05 NOTE — Procedures (Signed)
[place]  Patient:    Suzanne [name], Suzanne [name] Visit Number: [id number] MRN: [id number]          Service Type: END Location: ENDO Attending Physician:  Orland Mustard Dictated by:   Llana Aliment. Randa Evens, M.D. Proc. Date: 10/27/00 Admit Date:  10/27/2000   CC:         Collene Gobble, MD   Procedure Report  PROCEDURE PERFORMED:  Colonoscopy.  ENDOSCOPIST:  Llana Aliment. Randa Evens, M.D.  MEDICATIONS USED:  Fentanyl 50 mcg, Versed 5 mg IV.  INSTRUMENT:  Pediatric video colonoscope.  INDICATIONS:  Colon cancer screening.  DESCRIPTION OF PROCEDURE:  The procedure had been explained to the patient and consent obtained.  With the patient in the left lateral decubitus position, the Olympus pediatric video colonoscope was inserted and advanced under direct visualization.  The prep was excellent.  Using abdominal pressure and position change, we were able to reach the cecum.  The ileocecal valve and appendiceal orifice were seen.  The scope was withdrawn.  The cecum, ascending colon, hepatic flexure, transverse colon, splenic flexure, descending and sigmoid colon were seen well upon withdrawal.  Moderate diverticulosis seen in the sigmoid colon.  No polyps.  Scope withdrawn, patient tolerated the procedure well.  ASSESSMENT: 1. Mild to moderate diverticulosis. 2. No polyp.  PLAN:  Routine follow-up. Dictated by:   Llana Aliment. Randa Evens, M.D. Attending Physician:  Orland Mustard DD:  10/27/00 TD:  10/27/00 Job: [id number]

## 2010-08-01 ENCOUNTER — Ambulatory Visit
Admission: RE | Admit: 2010-08-01 | Discharge: 2010-08-01 | Disposition: A | Discharge status: Home / Self Care | Payer: Medicare Other | Source: Ambulatory Visit | Attending: Emergency Medicine | Admitting: Emergency Medicine

## 2010-08-01 DIAGNOSIS — Z1231 Encounter for screening mammogram for malignant neoplasm of breast: Secondary | ICD-10-CM

## 2010-08-01 LAB — HM MAMMOGRAPHY

## 2010-11-14 LAB — POCT I-STAT, CHEM 8
BUN: 45 — ABNORMAL HIGH
Calcium, Ion: 1.19
Chloride: 103
Creatinine, Ser: 1.5 — ABNORMAL HIGH
Glucose, Bld: 199 — ABNORMAL HIGH
HCT: 37
Potassium: 4.2

## 2010-11-14 LAB — POCT CARDIAC MARKERS
CKMB, poc: 1.7
CKMB, poc: 2.6
Operator id: 234501
Troponin i, poc: <0.05
Troponin i, poc: <0.05

## 2010-11-14 LAB — CBC
HCT: 35.2 — ABNORMAL LOW
Hemoglobin: 11.8 — ABNORMAL LOW
MCV: 92.4
RBC: 3.81 — ABNORMAL LOW
WBC: 9.8

## 2010-11-14 LAB — URINALYSIS, ROUTINE W REFLEX MICROSCOPIC
Bilirubin Urine: NEGATIVE
Nitrite: NEGATIVE
Specific Gravity, Urine: 1.022
Urobilinogen, UA: 0.2

## 2010-11-14 LAB — DIFFERENTIAL
Eosinophils Absolute: 0.1
Eosinophils Relative: 1
Lymphocytes Relative: 12
Lymphs Abs: 1.1
Monocytes Absolute: 0.5
Monocytes Relative: 6

## 2010-11-14 LAB — URINE MICROSCOPIC-ADD ON

## 2010-11-14 LAB — PROTIME-INR: Prothrombin Time: 21 — ABNORMAL HIGH

## 2010-12-17 ENCOUNTER — Encounter: Payer: Self-pay | Admitting: Emergency Medicine

## 2010-12-17 DIAGNOSIS — R413 Other amnesia: Secondary | ICD-10-CM | POA: Insufficient documentation

## 2010-12-17 DIAGNOSIS — R911 Solitary pulmonary nodule: Secondary | ICD-10-CM | POA: Insufficient documentation

## 2010-12-17 DIAGNOSIS — D649 Anemia, unspecified: Secondary | ICD-10-CM | POA: Insufficient documentation

## 2010-12-17 DIAGNOSIS — I2510 Atherosclerotic heart disease of native coronary artery without angina pectoris: Secondary | ICD-10-CM | POA: Insufficient documentation

## 2010-12-17 DIAGNOSIS — I701 Atherosclerosis of renal artery: Secondary | ICD-10-CM | POA: Insufficient documentation

## 2010-12-17 DIAGNOSIS — I509 Heart failure, unspecified: Secondary | ICD-10-CM | POA: Insufficient documentation

## 2010-12-17 DIAGNOSIS — M810 Age-related osteoporosis without current pathological fracture: Secondary | ICD-10-CM | POA: Insufficient documentation

## 2010-12-17 DIAGNOSIS — I739 Peripheral vascular disease, unspecified: Secondary | ICD-10-CM | POA: Insufficient documentation

## 2010-12-17 DIAGNOSIS — E782 Mixed hyperlipidemia: Secondary | ICD-10-CM | POA: Insufficient documentation

## 2011-02-16 ENCOUNTER — Inpatient Hospital Stay (HOSPITAL_COMMUNITY)
Admission: AD | Admit: 2011-02-16 | Discharge: 2011-02-19 | DRG: 194 | Disposition: A | Discharge status: Home / Self Care | Payer: Medicare Other | Source: Ambulatory Visit | Attending: Family Medicine | Admitting: Family Medicine

## 2011-02-16 ENCOUNTER — Inpatient Hospital Stay (HOSPITAL_COMMUNITY): Payer: Medicare Other

## 2011-02-16 ENCOUNTER — Ambulatory Visit (INDEPENDENT_AMBULATORY_CARE_PROVIDER_SITE_OTHER): Payer: Medicare Other

## 2011-02-16 DIAGNOSIS — I509 Heart failure, unspecified: Secondary | ICD-10-CM | POA: Diagnosis present

## 2011-02-16 DIAGNOSIS — Z888 Allergy status to other drugs, medicaments and biological substances status: Secondary | ICD-10-CM

## 2011-02-16 DIAGNOSIS — R413 Other amnesia: Secondary | ICD-10-CM | POA: Diagnosis present

## 2011-02-16 DIAGNOSIS — E782 Mixed hyperlipidemia: Secondary | ICD-10-CM

## 2011-02-16 DIAGNOSIS — I2510 Atherosclerotic heart disease of native coronary artery without angina pectoris: Secondary | ICD-10-CM

## 2011-02-16 DIAGNOSIS — Z7901 Long term (current) use of anticoagulants: Secondary | ICD-10-CM

## 2011-02-16 DIAGNOSIS — Z794 Long term (current) use of insulin: Secondary | ICD-10-CM

## 2011-02-16 DIAGNOSIS — D649 Anemia, unspecified: Secondary | ICD-10-CM

## 2011-02-16 DIAGNOSIS — I5032 Chronic diastolic (congestive) heart failure: Secondary | ICD-10-CM | POA: Diagnosis present

## 2011-02-16 DIAGNOSIS — IMO0002 Reserved for concepts with insufficient information to code with codable children: Secondary | ICD-10-CM

## 2011-02-16 DIAGNOSIS — R911 Solitary pulmonary nodule: Secondary | ICD-10-CM | POA: Diagnosis present

## 2011-02-16 DIAGNOSIS — Z87891 Personal history of nicotine dependence: Secondary | ICD-10-CM

## 2011-02-16 DIAGNOSIS — I739 Peripheral vascular disease, unspecified: Secondary | ICD-10-CM | POA: Diagnosis present

## 2011-02-16 DIAGNOSIS — E785 Hyperlipidemia, unspecified: Secondary | ICD-10-CM | POA: Diagnosis present

## 2011-02-16 DIAGNOSIS — R0602 Shortness of breath: Secondary | ICD-10-CM

## 2011-02-16 DIAGNOSIS — J441 Chronic obstructive pulmonary disease with (acute) exacerbation: Secondary | ICD-10-CM | POA: Diagnosis present

## 2011-02-16 DIAGNOSIS — J189 Pneumonia, unspecified organism: Principal | ICD-10-CM | POA: Diagnosis present

## 2011-02-16 DIAGNOSIS — I10 Essential (primary) hypertension: Secondary | ICD-10-CM | POA: Diagnosis present

## 2011-02-16 DIAGNOSIS — M810 Age-related osteoporosis without current pathological fracture: Secondary | ICD-10-CM | POA: Diagnosis present

## 2011-02-16 DIAGNOSIS — I701 Atherosclerosis of renal artery: Secondary | ICD-10-CM | POA: Diagnosis present

## 2011-02-16 DIAGNOSIS — E119 Type 2 diabetes mellitus without complications: Secondary | ICD-10-CM | POA: Diagnosis present

## 2011-02-16 DIAGNOSIS — Z79899 Other long term (current) drug therapy: Secondary | ICD-10-CM

## 2011-02-16 DIAGNOSIS — R0902 Hypoxemia: Secondary | ICD-10-CM

## 2011-02-16 DIAGNOSIS — Z86718 Personal history of other venous thrombosis and embolism: Secondary | ICD-10-CM

## 2011-02-16 HISTORY — DX: Pneumonia, unspecified organism: J18.9

## 2011-02-16 HISTORY — DX: Chronic obstructive pulmonary disease, unspecified: J44.9

## 2011-02-16 LAB — COMPREHENSIVE METABOLIC PANEL
Albumin: 3.2 g/dL — ABNORMAL LOW (ref 3.5–5.2)
Alkaline Phosphatase: 54 U/L (ref 39–117)
BUN: 37 mg/dL — ABNORMAL HIGH (ref 6–23)
CO2: 25 mEq/L (ref 19–32)
Chloride: 102 mEq/L (ref 96–112)
Glucose, Bld: 383 mg/dL — ABNORMAL HIGH (ref 70–99)
Potassium: 4.7 mEq/L (ref 3.5–5.1)
Total Bilirubin: 0.2 mg/dL — ABNORMAL LOW (ref 0.3–1.2)

## 2011-02-16 LAB — CBC
HCT: 37.4 % (ref 36.0–46.0)
Hemoglobin: 12.2 g/dL (ref 12.0–15.0)
RBC: 3.95 MIL/uL (ref 3.87–5.11)
WBC: 7.4 10*3/uL (ref 4.0–10.5)

## 2011-02-16 LAB — PROTIME-INR: Prothrombin Time: 16.6 seconds — ABNORMAL HIGH (ref 11.6–15.2)

## 2011-02-16 MED ORDER — WARFARIN SODIUM 2.5 MG PO TABS
2.5000 mg | ORAL_TABLET | ORAL | Status: DC
  Administered 2011-02-16: 2.5 mg via ORAL
  Filled 2011-02-16: qty 1

## 2011-02-16 MED ORDER — ALBUTEROL SULFATE (5 MG/ML) 0.5% IN NEBU: 2.5000 mg | INHALATION_SOLUTION | RESPIRATORY_TRACT | Status: DC | PRN

## 2011-02-16 MED ORDER — SODIUM CHLORIDE 0.9 % IJ SOLN
3.0000 mL | Freq: Two times a day (BID) | INTRAMUSCULAR | Status: DC
  Administered 2011-02-16 – 2011-02-19 (×6): 3 mL via INTRAVENOUS

## 2011-02-16 MED ORDER — ROSUVASTATIN CALCIUM 20 MG PO TABS
20.0000 mg | ORAL_TABLET | Freq: Every day | ORAL | Status: DC
  Administered 2011-02-16 – 2011-02-18 (×3): 20 mg via ORAL
  Filled 2011-02-16 (×4): qty 1

## 2011-02-16 MED ORDER — PANTOPRAZOLE SODIUM 40 MG PO TBEC
40.0000 mg | DELAYED_RELEASE_TABLET | Freq: Every day | ORAL | Status: DC
  Administered 2011-02-16 – 2011-02-19 (×4): 40 mg via ORAL
  Filled 2011-02-16 (×4): qty 1

## 2011-02-16 MED ORDER — FUROSEMIDE 20 MG PO TABS
20.0000 mg | ORAL_TABLET | ORAL | Status: DC
  Administered 2011-02-17: 20 mg via ORAL
  Filled 2011-02-16: qty 1

## 2011-02-16 MED ORDER — INSULIN ASPART 100 UNIT/ML ~~LOC~~ SOLN
0.0000 [IU] | Freq: Three times a day (TID) | SUBCUTANEOUS | Status: DC
  Administered 2011-02-17 (×3): 5 [IU] via SUBCUTANEOUS
  Administered 2011-02-18: 2 [IU] via SUBCUTANEOUS
  Administered 2011-02-18: 3 [IU] via SUBCUTANEOUS
  Administered 2011-02-18 – 2011-02-19 (×3): 5 [IU] via SUBCUTANEOUS
  Filled 2011-02-16: qty 3

## 2011-02-16 MED ORDER — SODIUM CHLORIDE 0.9 % IJ SOLN: 3.0000 mL | INTRAMUSCULAR | Status: DC | PRN

## 2011-02-16 MED ORDER — LOSARTAN POTASSIUM 50 MG PO TABS
100.0000 mg | ORAL_TABLET | Freq: Every day | ORAL | Status: DC
  Administered 2011-02-17 – 2011-02-19 (×3): 100 mg via ORAL
  Filled 2011-02-16 (×3): qty 2

## 2011-02-16 MED ORDER — DOXYCYCLINE HYCLATE 100 MG PO TABS
100.0000 mg | ORAL_TABLET | Freq: Two times a day (BID) | ORAL | Status: DC
  Administered 2011-02-16: 100 mg via ORAL
  Filled 2011-02-16: qty 1

## 2011-02-16 MED ORDER — VITAMIN B-6 100 MG PO TABS
100.0000 mg | ORAL_TABLET | Freq: Every day | ORAL | Status: DC
  Administered 2011-02-17 – 2011-02-19 (×3): 100 mg via ORAL
  Filled 2011-02-16 (×3): qty 1

## 2011-02-16 MED ORDER — SODIUM CHLORIDE 0.9 % IV SOLN: 250.0000 mL | INTRAVENOUS | Status: DC | PRN

## 2011-02-16 MED ORDER — METOPROLOL SUCCINATE ER 25 MG PO TB24
25.0000 mg | ORAL_TABLET | Freq: Every day | ORAL | Status: DC
  Administered 2011-02-16 – 2011-02-18 (×3): 25 mg via ORAL
  Filled 2011-02-16 (×3): qty 1

## 2011-02-16 NOTE — H&P (Signed)
Family Medicine Teaching [facility] Admission History and Physical  Patient name: Suzanne [name] Medical record number: [id number] Date of birth: 04-May-1921 Age: 77 y.o. Gender: female  Primary Care Provider:  [facility]- Dr. Cleta Alberts  Chief Complaint: SOB  History of Present Illness: Suzanne [name] is a 77 y.o. year old female presenting with viral symptoms of cough, increased mucous production, and body aches x 1 week.  + sick contacts over christmas holiday with similar symptoms.  Has h/o copd.  SOB, cough, and mucous production seemed to increase today so pt when to urgent care for evaluation.  Pt found to have bilateral pneumonia.  Pulse ox in low 90's.  Due to increased wob and h/o lung disease, pt was direct admitted due for further inpatient workup and treatment.    Patient Active Problem List  Diagnoses  . Type II or unspecified type diabetes mellitus without mention of complication, not stated as uncontrolled  . PVD (peripheral vascular disease)  . Mixed hyperlipidemia  . Renal artery stenosis  . Memory loss, short term  . Pulmonary nodule  . CAD (coronary artery disease)  . CHF (congestive heart failure)  . Anemia  . Osteoporosis   Past Medical History: Past Medical History  Diagnosis Date  . Type II or unspecified type diabetes mellitus without mention of complication, not stated as uncontrolled   . PVD (peripheral vascular disease)   . Mixed hyperlipidemia   . Renal artery stenosis   . Compression fx, thoracic spine     T12  . Ischium fracture   . Memory loss, short term   . Pulmonary nodule   . CAD (coronary artery disease)   . CHF (congestive heart failure)   . Anemia     Past Surgical History: Past Surgical History  Procedure Date  . Aorta surgery     aortic anyuersm surgery.   . Vessel surgery     in right leg- unsure of what type of surgery    Social History: History   Social History  . Marital Status: Widowed    Spouse Name: N/A   Number of Children: N/A  . Years of Education: N/A   Social History Main Topics  . Smoking status: Former Smoker    Quit date: 02/16/1997  . Smokeless tobacco: Not on file  . Alcohol Use: No  . Drug Use: No  . Sexually Active: Not on file   Social History Narrative   Lives by self, cares for self, drives.      Family History: Family History  Problem Relation Age of Onset  . Tuberculosis Mother   . Cancer Father     Allergies: Allergies  Allergen Reactions  . Codeine Other (See Comments)    hallucinations  . Lyrica Other (See Comments)    unknown   No current facility-administered medications on file prior to encounter.   Current Outpatient Prescriptions on File Prior to Encounter  Medication Sig Dispense Refill  . albuterol-ipratropium (COMBIVENT) 18-103 MCG/ACT inhaler Inhale 2 puffs into the lungs every 4 (four) hours as needed. For wheezing      . budesonide-formoterol (SYMBICORT) 160-4.5 MCG/ACT inhaler Inhale 2 puffs into the lungs 2 (two) times daily.        . calcium-vitamin D (OSCAL WITH D) 500-200 MG-UNIT per tablet Take 1 tablet by mouth 2 (two) times daily.        [name] esomeprazole (NEXIUM) 40 MG capsule Take 40 mg by mouth daily before breakfast.        .  furosemide (LASIX) 20 MG tablet Take 20 mg by mouth every other day.       . ibandronate (BONIVA) 150 MG tablet Take 150 mg by mouth every 30 (thirty) days. Take in the morning with a full glass of water, on an empty stomach, and do not take anything else by mouth or lie down for the next 30 min.       . insulin glargine (LANTUS SOLOSTAR) 100 UNIT/ML injection Inject 16 Units into the skin at bedtime.        [name] losartan (COZAAR) 100 MG tablet Take 100 mg by mouth daily.        . metoprolol succinate (TOPROL-XL) 25 MG 24 hr tablet Take 25 mg by mouth daily.        . Omega-3 Fatty Acids (FISH OIL PO) Take 1,200 mg by mouth 2 (two) times daily.        [name] pyridOXINE (VITAMIN B-6) 100 MG tablet Take 100 mg by mouth  daily.        . rosuvastatin (CRESTOR) 20 MG tablet Take 20 mg by mouth daily.        [name] warfarin (COUMADIN) 2.5 MG tablet Take 2.5 mg by mouth See admin instructions. On Monday and Friday, take 1.25mg  (one half tablet). On Tuesday, Wednesday, Thursday, Saturday, and Sunday, take 2.5mg  (one tablet)       Review Of Systems: Per HPI with the following additions: negative except for ROS positive in HPI Otherwise 12 point review of systems was performed and was unremarkable.  Physical Exam: Pulse: 81  Blood Pressure: 137/66 RR: 20   O2: 96 on 2 L [place] Temp: 97.9  General: alert and cooperative HEENT: [place], AT Heart: S1, S2 normal, no murmur, rub or gallop, regular rate and rhythm- distant Lungs: clear in upper lobes, but wheezing with exhalation in bases bilateral.  +Coarse lung sounds. + air movement.  Abdomen: abdomen is soft without significant tenderness, masses, organomegaly or guarding Extremities: extremities normal, atraumatic, no cyanosis or edema Skin:no rashes Neurology: mental status, speech normal, alert and oriented x3 and reflexes normal and symmetric  Labs and Imaging: Lab Results  Component Value Date/Time   NA 137 02/16/2011 10:27 PM   K 4.7 02/16/2011 10:27 PM   CL 102 02/16/2011 10:27 PM   CO2 25 02/16/2011 10:27 PM   BUN 37* 02/16/2011 10:27 PM   CREATININE 1.30* 02/16/2011 10:27 PM   GLUCOSE 383* 02/16/2011 10:27 PM   Lab Results  Component Value Date   WBC 7.4 02/16/2011   HGB 12.2 02/16/2011   HCT 37.4 02/16/2011   MCV 94.7 02/16/2011   PLT 157 02/16/2011   CXR- pending   Assessment and Plan: Suzanne [name] is a 77 y.o. year old female presenting with cough, increased mucous production, body aches and SOB 1. Pneumonia- Per urgent care report-from Dr. Georgiana Shore- pt has bilateral pneumonia.-- repeat CXR pending - could not get disc to work in order to see films that were sent over from urgent care. Will treat with avelox po daily. Will give scheduled albuterol and  atovent.  O2 to keep sats greater than 88%.  May also have component of COPD exacerbation.  Will not give steroids at this time- did get solumedrol as outpatient today.  Follow pt clinical exam.  Consider start of prednisone tomorrow am.  Pt requesting no IV.  Will try to manage with po medications only.  If no improvement by tomorrow may need to place IV access.  2. CHF- has h/o chf.  Pro BNP pending.  No lower ext edema.  No crackles on lung exam.  Will f/up cxr and bnp.  Will add lasix if indicated.    3. DM-  Will cover with SSI since pt po intake has been decreased the past couple of days.  Will monitor CBG's closely and modify insulin regimen as needed  4. HTN- continue home regiemn- lasix 20mg  po daily, cozaar 100mg  po daily, toprol XL 25mg  po daily,   5. Hyperlipidemia- continue crestor- per home regimen  6. H/o dvt- continue coumadin per home regimen, pharmacy consult, INR decreased- pt missed 2 doses of home meds during past week.   7. FEN/GI: regular diet, po's only, no IV fluids.  No IV access- per pt's request.   8. Prophylaxis: coumadin  9. Disposition: pending clinical improvement.

## 2011-02-17 ENCOUNTER — Encounter (HOSPITAL_COMMUNITY): Payer: Self-pay | Admitting: Family Medicine

## 2011-02-17 DIAGNOSIS — I5023 Acute on chronic systolic (congestive) heart failure: Secondary | ICD-10-CM

## 2011-02-17 DIAGNOSIS — J441 Chronic obstructive pulmonary disease with (acute) exacerbation: Secondary | ICD-10-CM

## 2011-02-17 LAB — GLUCOSE, CAPILLARY
Glucose-Capillary: 281 mg/dL — ABNORMAL HIGH (ref 70–99)
Glucose-Capillary: 252 mg/dL — ABNORMAL HIGH (ref 70–99)

## 2011-02-17 LAB — HEMOGLOBIN A1C: Mean Plasma Glucose: 148 mg/dL — ABNORMAL HIGH (ref <117)

## 2011-02-17 LAB — PROTIME-INR
INR: 1.36 (ref 0.00–1.49)
Prothrombin Time: 17 seconds — ABNORMAL HIGH (ref 11.6–15.2)

## 2011-02-17 MED ORDER — IPRATROPIUM BROMIDE 0.02 % IN SOLN
0.5000 mg | Freq: Four times a day (QID) | RESPIRATORY_TRACT | Status: DC
  Administered 2011-02-17: 0.5 mg via RESPIRATORY_TRACT
  Filled 2011-02-17: qty 2.5

## 2011-02-17 MED ORDER — IPRATROPIUM BROMIDE 0.02 % IN SOLN: 0.5000 mg | Freq: Four times a day (QID) | RESPIRATORY_TRACT | Status: DC | PRN

## 2011-02-17 MED ORDER — ALBUTEROL SULFATE (5 MG/ML) 0.5% IN NEBU
2.5000 mg | INHALATION_SOLUTION | RESPIRATORY_TRACT | Status: DC
  Administered 2011-02-17 – 2011-02-18 (×4): 2.5 mg via RESPIRATORY_TRACT
  Filled 2011-02-17 (×4): qty 0.5

## 2011-02-17 MED ORDER — INSULIN GLARGINE 100 UNIT/ML ~~LOC~~ SOLN
5.0000 [IU] | Freq: Every day | SUBCUTANEOUS | Status: DC
  Administered 2011-02-17: 5 [IU] via SUBCUTANEOUS
  Filled 2011-02-17: qty 3

## 2011-02-17 MED ORDER — WARFARIN SODIUM 2.5 MG PO TABS
2.5000 mg | ORAL_TABLET | Freq: Once | ORAL | Status: AC
  Administered 2011-02-17: 2.5 mg via ORAL
  Filled 2011-02-17: qty 1

## 2011-02-17 MED ORDER — PREDNISONE 50 MG PO TABS
60.0000 mg | ORAL_TABLET | Freq: Every day | ORAL | Status: DC
  Administered 2011-02-18 – 2011-02-19 (×2): 60 mg via ORAL
  Filled 2011-02-17 (×3): qty 1

## 2011-02-17 MED ORDER — FUROSEMIDE 40 MG PO TABS
40.0000 mg | ORAL_TABLET | Freq: Every day | ORAL | Status: DC
  Administered 2011-02-18: 40 mg via ORAL
  Filled 2011-02-17: qty 1

## 2011-02-17 MED ORDER — MOXIFLOXACIN HCL 400 MG PO TABS
400.0000 mg | ORAL_TABLET | Freq: Every day | ORAL | Status: DC
  Administered 2011-02-17 – 2011-02-19 (×3): 400 mg via ORAL
  Filled 2011-02-17 (×3): qty 1

## 2011-02-17 MED ORDER — ALBUTEROL SULFATE (5 MG/ML) 0.5% IN NEBU: 2.5000 mg | INHALATION_SOLUTION | RESPIRATORY_TRACT | Status: DC | PRN

## 2011-02-17 MED ORDER — IPRATROPIUM BROMIDE 0.02 % IN SOLN
0.5000 mg | Freq: Three times a day (TID) | RESPIRATORY_TRACT | Status: DC
  Administered 2011-02-17 – 2011-02-19 (×7): 0.5 mg via RESPIRATORY_TRACT
  Filled 2011-02-17 (×7): qty 2.5

## 2011-02-17 MED ORDER — ALBUTEROL SULFATE (5 MG/ML) 0.5% IN NEBU
2.5000 mg | INHALATION_SOLUTION | Freq: Three times a day (TID) | RESPIRATORY_TRACT | Status: DC
  Administered 2011-02-17: 2.5 mg via RESPIRATORY_TRACT
  Filled 2011-02-17: qty 0.5

## 2011-02-17 MED ORDER — ALBUTEROL SULFATE (5 MG/ML) 0.5% IN NEBU
2.5000 mg | INHALATION_SOLUTION | RESPIRATORY_TRACT | Status: DC
  Administered 2011-02-17: 2.5 mg via RESPIRATORY_TRACT
  Filled 2011-02-17: qty 0.5

## 2011-02-17 MED ORDER — INSULIN GLARGINE 100 UNIT/ML ~~LOC~~ SOLN
10.0000 [IU] | Freq: Every day | SUBCUTANEOUS | Status: DC
  Administered 2011-02-18: 10 [IU] via SUBCUTANEOUS

## 2011-02-17 NOTE — Progress Notes (Signed)
UR Completed.  Suzanne [name] 02/17/2011 [phone]  

## 2011-02-17 NOTE — Progress Notes (Signed)
PGY-1 Daily Progress Note Family Medicine Teaching Service D. [place] Rolene Arbour, MD Service Pager: [phone]  Patient name: Suzanne [name]  Medical record [id number] Date of birth:[birth year] Age: 77 y.o. Gender: female  LOS: 1 day   Subjective: Feeling better. moderated PO intake.  Objective:  Vitals: Temp:  [97.6 F (36.4 C)-97.9 F (36.6 C)] 97.6 F (36.4 C) (12/31 0635) Pulse Rate:  [76-90] 90  (12/31 0940) Resp:  [19-22] 19  (12/31 0635) BP: (137-164)/(66-76) 164/76 mmHg (12/31 0940) SpO2:  [95 %-97 %] 97 % (12/31 1131) Weight:  [156 lb 15.5 oz (71.2 kg)] 156 lb 15.5 oz (71.2 kg) (12/30 2005)  Intake/Output Summary (Last 24 hours) at 02/17/11 1401 Last data filed at 02/17/11 1336  Gross per 24 hour  Intake    480 ml  Output    500 ml  Net    -20 ml   Physical Exam: General: alert and cooperative  HEENT: normal and moist mucosa. Heart: S1, S2 normal, no murmur, rub or gallop, regular rate and rhythm- distant  Lungs: clear in upper lobes, but wheezing with exhalation in bases bilateral. +Coarse lung sounds. + air movement.  Abdomen: abdomen is soft without significant tenderness, masses, organomegaly or guarding  Extremities: extremities normal, atraumatic, no cyanosis or edema  Skin:no rashes  Neurology: mental status, speech normal, alert and oriented x3 and reflexes normal and symmetric   Labs and imaging:  CBC  Lab 02/16/11 2227  WBC 7.4  HGB 12.2  HCT 37.4  PLT 157   BMET  Lab 02/16/11 2227  NA 137  K 4.7  CL 102  CO2 25  BUN 37*  CREATININE 1.30*  LABGLOM --  GLUCOSE 383*  CALCIUM 9.6   PROTIME-INR     Status: Abnormal   Collection Time   02/16/11 10:27 PM      Component Value Range   Prothrombin Time 16.6 (*) 11.6 - 15.2 (seconds)   INR 1.32  0.00 - 1.49   PRO B NATRIURETIC PEPTIDE     Status: Abnormal   Collection Time   02/16/11 11:36 PM      Component Value Range   Pro B Natriuretic peptide (BNP) 725.5 (*) 0 - 450 (pg/mL)    PROTIME-INR     Status: Abnormal   Collection Time   02/17/11  5:00 AM      Component Value Range   Prothrombin Time 17.0 (*) 11.6 - 15.2 (seconds)   INR 1.36  0.00 - 1.49    X-ray Chest Pa And Lateral  02/17/2011  .  IMPRESSION: Emphysematous changes and scattered fibrosis in the lungs.  No evidence of active consolidation.  No significant interval change.  Original Report Authenticated By: Marlon Pel, M.D.   Medications: Medication Dose Route Frequency  . albuterol (PROVENTIL) (5 MG/ML) 0.5% nebulizer solution 2.5 mg  2.5 mg Nebulization Q4H  . furosemide (LASIX) tablet 20 mg  20 mg Oral QODAY  . insulin aspart (novoLOG) injection 0-9 Units  0-9 Units Subcutaneous TID WC  . insulin glargine (LANTUS) injection 5 Units  5 Units Subcutaneous Daily  . ipratropium (ATROVENT) nebulizer solution 0.5 mg  0.5 mg Nebulization TID  . losartan (COZAAR) tablet 100 mg  100 mg Oral Daily  . metoprolol succinate (TOPROL-XL) 24 hr tablet 25 mg  25 mg Oral Daily  . moxifloxacin (AVELOX) tablet 400 mg  400 mg Oral q1800  . pantoprazole (PROTONIX) EC tablet 40 mg  40 mg Oral Daily  . predniSONE (  DELTASONE) tablet 60 mg  60 mg Oral Q breakfast  . pyridOXINE (VITAMIN B-6) tablet 100 mg  100 mg Oral Daily  . rosuvastatin (CRESTOR) tablet 20 mg  20 mg Oral QHS  . warfarin (COUMADIN) tablet 2.5 mg  2.5 mg Oral Once   Assessment and Plan:  Suzanne [name] is a 77 y.o. year old female presenting with cough, increased mucous production, body aches and SOB  1. Respiratory: possible COPD exacerbation after a viral illness.   Pt requesting no IV. Will try to manage with po medications only. If no improvement by tomorrow may need to place IV access.  -Continue albuterol nebs an Avelox; started Prednisone. 2. CHF- has h/o chf. Pro BNP WNL (less than 900). No lower ext edema. No crackles on lung exam. Pending  ECHO. -Will add lasix if indicated.  3. DM- Will cover with SSI since pt po intake has been  decreased the past couple of days. Glucose today 200's. At home with lantus 16 units. We will  Start slowly with 5 units and increase pending her CBG's.   4. HTN- continue home regiemn- lasix 20mg  po daily, cozaar 100mg  po daily, toprol XL 25mg  po daily,  5. Hyperlipidemia- continue crestor- per home regimen  6. H/o dvt- continue coumadin per home regimen, pharmacy consult, INR decreased- pt missed 2 doses of home meds during past week.  7. FEN/GI: regular diet, po's only, no IV fluids. No IV access- per pt's request.  8. Prophylaxis: coumadin  9. Disposition: pending clinical improvement.   D. [place] Rolene Arbour, MD PGY1, [facility] Medicine Teaching Service Pager [phone] 02/17/2011

## 2011-02-17 NOTE — Progress Notes (Signed)
ANTICOAGULATION CONSULT NOTE - Follow Up Consult  Pharmacy Consult for Coumadin Indication: hx DVT  Allergies  Allergen Reactions  . Codeine Other (See Comments)    hallucinations  . Lyrica Other (See Comments)    unknown    Patient Measurements: Weight: 156 lb 15.5 oz (71.2 kg)  Vital Signs: Temp: 97.6 F (36.4 C) (12/31 0635) Temp src: Oral (12/31 0635) BP: 164/76 mmHg (12/31 0940) Pulse Rate: 90  (12/31 0940)  Labs:  Basename 02/17/11 0500 02/16/11 2227  HGB -- 12.2  HCT -- 37.4  PLT -- 157  APTT -- --  LABPROT 17.0* 16.6*  INR 1.36 1.32  HEPARINUNFRC -- --  CREATININE -- 1.30*  CKTOTAL -- --  CKMB -- --  TROPONINI -- --   CrCl is unknown because there is no height on file for the current visit.   Medications:  Scheduled:    . albuterol  2.5 mg Nebulization Q4H  . furosemide  20 mg Oral QODAY  . insulin aspart  0-9 Units Subcutaneous TID WC  . insulin glargine  5 Units Subcutaneous Daily  . ipratropium  0.5 mg Nebulization TID  . losartan  100 mg Oral Daily  . metoprolol succinate  25 mg Oral Daily  . moxifloxacin  400 mg Oral q1800  . pantoprazole  40 mg Oral Daily  . predniSONE  60 mg Oral Q breakfast  . pyridOXINE  100 mg Oral Daily  . rosuvastatin  20 mg Oral QHS  . sodium chloride  3 mL Intravenous Q12H  . warfarin  2.5 mg Oral Once  . warfarin  2.5 mg Oral ONCE-1800  . DISCONTD: albuterol  2.5 mg Nebulization Q4H  . DISCONTD: albuterol  2.5 mg Nebulization TID  . DISCONTD: doxycycline  100 mg Oral Q12H  . DISCONTD: ipratropium  0.5 mg Nebulization Q6H  . DISCONTD: warfarin  2.5 mg Oral See admin instructions    Assessment: 77 yo F on Coumadin fo hx DVT. INR below goal on admit due to several missed doses. Patient also on Avelox which may increase INR.  Patient was given Coumadin 2.5 mg 12/30 at 23:58 and 2.5 mg this am at 01:15.  Goal of Therapy:  INR 2-3   Plan:  1. Coumadin 2.5 mg po x1 tonight 2. INR daily  Loura Back  Danielle 02/17/2011,2:22 PM

## 2011-02-17 NOTE — Progress Notes (Signed)
*  PRELIMINARY RESULTS* Echocardiogram 2D Echocardiogram has been performed.  Clide Deutscher RDCS 02/17/2011, 9:43 AM

## 2011-02-17 NOTE — H&P (Signed)
I interviewed and examined this patient and discussed the care plan with Dr. Aviva Signs and the [facility] team and agree with assessment and plan as documented in the admission note for today.    Ingra Rother A. Sheffield Slider, MD Family Medicine Teaching Service Attending  02/17/2011 11:32 AM

## 2011-02-17 NOTE — Progress Notes (Signed)
Pt lungs with crackles.  O2 sat 97% on 2L.  Dr. Sondra Come made aware.  Instructed that she will f/u.  Will  Cont. To monitor.

## 2011-02-17 NOTE — Progress Notes (Signed)
ANTICOAGULATION CONSULT NOTE - Initial Consult  Pharmacy Consult for warfarin Indication: history of DVT  Allergies  Allergen Reactions  . Codeine Other (See Comments)    hallucinations  . Lyrica Other (See Comments)    unknown    Patient Measurements: Weight: 156 lb 15.5 oz (71.2 kg) Adjusted Body Weight:   Vital Signs: Temp: 97.9 F (36.6 C) (12/30 2238) Temp src: Oral (12/30 2238) BP: 137/66 mmHg (12/30 2238) Pulse Rate: 81  (12/30 2238)  Labs:  Basename 02/16/11 2227  HGB 12.2  HCT 37.4  PLT 157  APTT --  LABPROT 16.6*  INR 1.32  HEPARINUNFRC --  CREATININE 1.30*  CKTOTAL --  CKMB --  TROPONINI --   CrCl is unknown because there is no height on file for the current visit.  Medical History: Past Medical History  Diagnosis Date  . Type II or unspecified type diabetes mellitus without mention of complication, not stated as uncontrolled   . PVD (peripheral vascular disease)   . Mixed hyperlipidemia   . Renal artery stenosis   . Compression fx, thoracic spine     T12  . Ischium fracture   . Memory loss, short term   . Pulmonary nodule   . CAD (coronary artery disease)   . CHF (congestive heart failure)   . Anemia   . COPD (chronic obstructive pulmonary disease)   . Pneumonia     Medications:  Prescriptions prior to admission  Medication Sig Dispense Refill  . albuterol-ipratropium (COMBIVENT) 18-103 MCG/ACT inhaler Inhale 2 puffs into the lungs every 4 (four) hours as needed. For wheezing      . budesonide-formoterol (SYMBICORT) 160-4.5 MCG/ACT inhaler Inhale 2 puffs into the lungs 2 (two) times daily.        . calcium-vitamin D (OSCAL WITH D) 500-200 MG-UNIT per tablet Take 1 tablet by mouth 2 (two) times daily.        [name] esomeprazole (NEXIUM) 40 MG capsule Take 40 mg by mouth daily before breakfast.        . ferrous sulfate 325 (65 FE) MG tablet Take 325 mg by mouth daily.        . furosemide (LASIX) 20 MG tablet Take 20 mg by mouth every other day.        . ibandronate (BONIVA) 150 MG tablet Take 150 mg by mouth every 30 (thirty) days. Take in the morning with a full glass of water, on an empty stomach, and do not take anything else by mouth or lie down for the next 30 min.       . insulin glargine (LANTUS SOLOSTAR) 100 UNIT/ML injection Inject 16 Units into the skin at bedtime.        [name] losartan (COZAAR) 100 MG tablet Take 100 mg by mouth daily.        . metoprolol succinate (TOPROL-XL) 25 MG 24 hr tablet Take 25 mg by mouth daily.        . Omega-3 Fatty Acids (FISH OIL PO) Take 1,200 mg by mouth 2 (two) times daily.        [name] pyridOXINE (VITAMIN B-6) 100 MG tablet Take 100 mg by mouth daily.        . rosuvastatin (CRESTOR) 20 MG tablet Take 20 mg by mouth daily.        [name] warfarin (COUMADIN) 2.5 MG tablet Take 2.5 mg by mouth See admin instructions. On Monday and Friday, take 1.25mg  (one half tablet). On Tuesday, Wednesday, Thursday, Saturday, and Sunday, take  2.5mg  (one tablet)        Assessment: 89 yof with history of DVT on coumadin. INR subtherapeutic but patient has missed at least 2 days of medication due to her illness. Will resume coumadin tonight and check INR daily Goal of Therapy:  INR 2-3   Plan:  Warfarin 2.5mg  tonight x1 INR daily   Severiano Gilbert 02/17/2011,12:53 AM

## 2011-02-18 ENCOUNTER — Inpatient Hospital Stay (HOSPITAL_COMMUNITY): Payer: Medicare Other

## 2011-02-18 LAB — BASIC METABOLIC PANEL
CO2: 29 mEq/L (ref 19–32)
Calcium: 9.6 mg/dL (ref 8.4–10.5)
Creatinine, Ser: 1.34 mg/dL — ABNORMAL HIGH (ref 0.50–1.10)
Glucose, Bld: 179 mg/dL — ABNORMAL HIGH (ref 70–99)

## 2011-02-18 LAB — CBC
Hemoglobin: 12 g/dL (ref 12.0–15.0)
MCH: 30.1 pg (ref 26.0–34.0)
MCHC: 31.8 g/dL (ref 30.0–36.0)
MCV: 94.5 fL (ref 78.0–100.0)
RBC: 3.99 MIL/uL (ref 3.87–5.11)

## 2011-02-18 LAB — GLUCOSE, CAPILLARY
Glucose-Capillary: 233 mg/dL — ABNORMAL HIGH (ref 70–99)
Glucose-Capillary: 263 mg/dL — ABNORMAL HIGH (ref 70–99)
Glucose-Capillary: 196 mg/dL — ABNORMAL HIGH (ref 70–99)

## 2011-02-18 MED ORDER — ALBUTEROL SULFATE (5 MG/ML) 0.5% IN NEBU
2.5000 mg | INHALATION_SOLUTION | Freq: Three times a day (TID) | RESPIRATORY_TRACT | Status: DC
  Administered 2011-02-18 – 2011-02-19 (×4): 2.5 mg via RESPIRATORY_TRACT
  Filled 2011-02-18 (×4): qty 0.5

## 2011-02-18 MED ORDER — METHYLPREDNISOLONE SODIUM SUCC 40 MG IJ SOLR
40.0000 mg | Freq: Once | INTRAMUSCULAR | Status: AC
  Administered 2011-02-18: 40 mg via INTRAVENOUS
  Filled 2011-02-18: qty 1

## 2011-02-18 MED ORDER — INSULIN GLARGINE 100 UNIT/ML ~~LOC~~ SOLN
14.0000 [IU] | Freq: Every day | SUBCUTANEOUS | Status: DC
  Administered 2011-02-19: 14 [IU] via SUBCUTANEOUS
  Filled 2011-02-18: qty 3

## 2011-02-18 MED ORDER — WARFARIN SODIUM 2.5 MG PO TABS
2.5000 mg | ORAL_TABLET | Freq: Once | ORAL | Status: AC
  Administered 2011-02-18: 2.5 mg via ORAL
  Filled 2011-02-18: qty 1

## 2011-02-18 MED ORDER — METOPROLOL TARTRATE 25 MG PO TABS
25.0000 mg | ORAL_TABLET | Freq: Once | ORAL | Status: AC
  Administered 2011-02-18: 25 mg via ORAL
  Filled 2011-02-18: qty 1

## 2011-02-18 MED ORDER — GUAIFENESIN ER 600 MG PO TB12
600.0000 mg | ORAL_TABLET | Freq: Two times a day (BID) | ORAL | Status: DC
  Administered 2011-02-18 – 2011-02-19 (×2): 600 mg via ORAL
  Filled 2011-02-18 (×3): qty 1

## 2011-02-18 MED ORDER — FUROSEMIDE 20 MG PO TABS
20.0000 mg | ORAL_TABLET | Freq: Every day | ORAL | Status: DC
  Administered 2011-02-19: 20 mg via ORAL
  Filled 2011-02-18: qty 1

## 2011-02-18 MED ORDER — METOPROLOL SUCCINATE ER 50 MG PO TB24
50.0000 mg | ORAL_TABLET | Freq: Every day | ORAL | Status: DC
Start: 2011-02-19 — End: 2011-02-19
  Administered 2011-02-19: 50 mg via ORAL
  Filled 2011-02-18: qty 1

## 2011-02-18 NOTE — Progress Notes (Addendum)
Subjective: Patient states she feels better today.  Sitting comfortably, no respiratory distress.  Denies any cough, chest pain, or peripheral edema.  Still on 2L [place], but O2 sats 96%.  Objective: Vital signs in last 24 hours: Temp:  [98.1 F (36.7 C)-98.7 F (37.1 C)] 98.1 F (36.7 C) (01/01 0700) Pulse Rate:  [78-93] 93  (01/01 0700) Resp:  [18-20] 20  (01/01 0700) BP: (140-159)/(68-79) 159/79 mmHg (01/01 0700) SpO2:  [92 %-98 %] 96 % (01/01 0844) Weight:  [154 lb 15.7 oz (70.3 kg)] 154 lb 15.7 oz (70.3 kg) (01/01 0700) Weight change: -1 lb 15.8 oz (-0.9 kg) Last BM Date: 02/16/11  Intake/Output from previous day: 12/31 0701 - 01/01 0700 In: 1080 [P.O.:1080] Out: 1100 [Urine:1100] Intake/Output this shift: Total I/O In: -  Out: 350 [Urine:350]  General appearance: alert, cooperative and no distress Resp: clear BS with inspiration, diffuse rhonchi with exhalation, no wheezes Cardio: distant HS, difficult to appreciate any murmur or irregular rhythm GI: soft, non-tender; bowel sounds normal; no masses,  no organomegaly Extremities: extremities normal, atraumatic, no cyanosis or edema Skin: Skin color, texture, turgor normal. No rashes or lesions  Lab Results:  [facility] 02/18/11 0550 02/16/11 2227  WBC 9.2 7.4  HGB 12.0 12.2  HCT 37.7 37.4  PLT 169 157   BMET  Basename 02/18/11 0550 02/16/11 2227  NA 142 137  K 4.1 4.7  CL 104 102  CO2 29 25  GLUCOSE 179* 383*  BUN 42* 37*  CREATININE 1.34* 1.30*  CALCIUM 9.6 9.6    Studies/Results: X-ray Chest Pa And Lateral   02/17/2011 CHEST - 2 VIEW   IMPRESSION: Emphysematous changes and scattered fibrosis in the lungs.  No evidence of active consolidation.  No significant interval change.  Original Report Authenticated By: Marlon Pel, M.D.   02/18/11 Repeat CXR pending  Medications: I have reviewed the patient's current medications.  Assessment/Plan: DEJANAE HELSER is a 78 y.o. year old female presenting with  cough, increased mucous production, body aches and SOB   1. Respiratory: possible COPD exacerbation after a viral illness vs. CHF exacerbation.  - For COPD, continue albuterol nebs and Prednisone 60  - Continue Avelox Day 2/7 - Follow up CXR for evidence of CHF - if negative, decrease Lasix to home dose 20 mg - Wean O2 as tolerated, maintain sats > 90% - Ambulate today off O2 and record sats - Will order PT consult  2. CHF:  history of EF 20-25%, Pro BNP WNL (less than 900). No lower ext edema.  New Echo pending. - Follow up Echo results and CXR - Adjust Lasix as needed - Will call Dr. Hazle Coca office to let him know patient is admitted  3. DM: CBG elevated (170-200) Home Lantus 16 units. - Continue SSI and increase Lantus to 14 units  4. HTN: BP slightly elevated - Continue lasix 20 daily, cozaar 10 daily, toprol XL 25 daily - Continue to monitor  5. Hyperlipidemia:  Continue Crestor- per home regimen   6. H/o DVT: continue coumadin per pharmacy - Monitor INR  7. FEN/GI: regular diet.  No IV access- per pt's request  8. Prophylaxis: Coumadin per pharm  9. Disposition: likely DC home tomorrow.  Follow up PT recommendations prior to DC.   LOS: 2 days   DE LA CRUZ,IVY 02/18/2011, 10:56 AM    I have seen and examined this patient. I have discussed with de la Cruz.  I agree with their findings and plans as  documented in their progress note for today.  Kinzlee Selvy D

## 2011-02-18 NOTE — Progress Notes (Signed)
Patient with increased course rhonchi audible to ears.  Physician on-call informed and will be in to assess. No shortness of breath. Skin warm and dry.  Resp 24/min.   Margaretmary Bayley, RN

## 2011-02-18 NOTE — Progress Notes (Signed)
ANTICOAGULATION CONSULT NOTE - Follow Up Consult  Pharmacy Consult for Coumadin Indication: hx DVT  Assessment: 78 yo female on Coumadin PTA for hx DVT. INR below goal on admit due to several missed doses. Patient also on Avelox which may increase INR.  INR remains subtherapeutic today at 1.81, but increasing. No bleeding issues reported. Home dose is 2.5mg  qTuWThSS & 1.25mg  qMF.  Pharmacist System-Based Medication Review: Admit Complaint: viral symptoms of cough/inc. Mucus production and body aches x 1 week-evaled at urgent care and dx with bilateral pneumonia.   Infectious Disease: D#2 Avelox for b/l PNA. Afebrile. WBC=9.2 (slight increase likely d/t steroids) Cardiovascular: Hypertensive. Hx CHF, PVD, HLD, CAD. On Lasix po, losartan, toprol, Crestor. Echo results pending. Endocrinology: Hx DM. CBGs controlled on SSI, Lantus 10units Nephrology: SCr 1.3 - unsure of baseline. Renal artery stenosis (caution with ACE/ARB) est CrCl~47mL/min Pulmonary: b/l PNA vs. COPD exacerbation; on alb/ipra nebs, prednisone. Symbicort PTA Hematology / Oncology: CBC ok PTA Medication Issues: Symbicort, ferrous sulfate, fish oil, vitamins not resumed Best Practices: Coumadin for VTE px, Protonix po  Goal of Therapy:  INR 2-3   Plan:  1. Coumadin 2.5 mg po x1 today. Check INR in AM.  Ival Bible 02/18/2011,9:48 AM   Allergies  Allergen Reactions  . Codeine Other (See Comments)    hallucinations  . Lyrica Other (See Comments)    unknown    Patient Measurements: Weight: 154 lb 15.7 oz (70.3 kg)  Vital Signs: Temp: 98.1 F (36.7 C) (01/01 0700) Temp src: Oral (01/01 0700) BP: 159/79 mmHg (01/01 0700) Pulse Rate: 93  (01/01 0700)  Labs:  Basename 02/18/11 0550 02/17/11 0500 02/16/11 2227  HGB 12.0 -- 12.2  HCT 37.7 -- 37.4  PLT 169 -- 157  APTT -- -- --  LABPROT 21.3* 17.0* 16.6*  INR 1.81* 1.36 1.32  HEPARINUNFRC -- -- --  CREATININE 1.34* -- 1.30*  CKTOTAL -- -- --  CKMB -- --  --  TROPONINI -- -- --   CrCl is unknown because there is no height on file for the current visit.   Medications:  Scheduled:     . albuterol  2.5 mg Nebulization TID  . furosemide  40 mg Oral Daily  . insulin aspart  0-9 Units Subcutaneous TID WC  . insulin glargine  10 Units Subcutaneous Daily  . ipratropium  0.5 mg Nebulization TID  . losartan  100 mg Oral Daily  . metoprolol succinate  25 mg Oral Daily  . moxifloxacin  400 mg Oral q1800  . pantoprazole  40 mg Oral Daily  . predniSONE  60 mg Oral Q breakfast  . pyridOXINE  100 mg Oral Daily  . rosuvastatin  20 mg Oral QHS  . sodium chloride  3 mL Intravenous Q12H  . warfarin  2.5 mg Oral ONCE-1800  . DISCONTD: albuterol  2.5 mg Nebulization TID  . DISCONTD: albuterol  2.5 mg Nebulization Q4H  . DISCONTD: furosemide  20 mg Oral QODAY  . DISCONTD: insulin glargine  5 Units Subcutaneous Daily

## 2011-02-19 LAB — BASIC METABOLIC PANEL
BUN: 51 mg/dL — ABNORMAL HIGH (ref 6–23)
Chloride: 99 mEq/L (ref 96–112)
GFR calc Af Amer: 40 mL/min — ABNORMAL LOW (ref >90)
GFR calc non Af Amer: 34 mL/min — ABNORMAL LOW (ref >90)
Potassium: 4.5 mEq/L (ref 3.5–5.1)
Sodium: 138 mEq/L (ref 135–145)

## 2011-02-19 LAB — GLUCOSE, CAPILLARY
Glucose-Capillary: 298 mg/dL — ABNORMAL HIGH (ref 70–99)
Glucose-Capillary: 288 mg/dL — ABNORMAL HIGH (ref 70–99)

## 2011-02-19 LAB — PROTIME-INR: INR: 1.97 — ABNORMAL HIGH (ref 0.00–1.49)

## 2011-02-19 MED ORDER — PREDNISONE 20 MG PO TABS: 40.0000 mg | ORAL_TABLET | Freq: Every day | ORAL | Status: DC

## 2011-02-19 MED ORDER — LEVOFLOXACIN 500 MG PO TABS: 500.0000 mg | ORAL_TABLET | Freq: Every day | ORAL | Status: DC

## 2011-02-19 MED ORDER — WARFARIN SODIUM 2.5 MG PO TABS
2.5000 mg | ORAL_TABLET | Freq: Once | ORAL | Status: AC
  Administered 2011-02-19: 2.5 mg via ORAL
  Filled 2011-02-19: qty 1

## 2011-02-19 MED ORDER — PREDNISONE 20 MG PO TABS: 40.0000 mg | ORAL_TABLET | Freq: Every day | ORAL | Status: AC

## 2011-02-19 MED ORDER — GUAIFENESIN ER 600 MG PO TB12: 600.0000 mg | ORAL_TABLET | Freq: Two times a day (BID) | ORAL | Status: AC

## 2011-02-19 MED ORDER — GUAIFENESIN ER 600 MG PO TB12: 600.0000 mg | ORAL_TABLET | Freq: Two times a day (BID) | ORAL | Status: DC

## 2011-02-19 MED ORDER — LEVOFLOXACIN 500 MG PO TABS: 500.0000 mg | ORAL_TABLET | Freq: Every day | ORAL | Status: AC

## 2011-02-19 NOTE — Progress Notes (Signed)
Physical Therapy Evaluation Patient Details Name: HANNAH STRADER MRN: [id number] DOB: 11-23-1921 Today's Date: 02/19/2011  Problem List:  Patient Active Problem List  Diagnoses  . Type II or unspecified type diabetes mellitus without mention of complication, not stated as uncontrolled  . PVD (peripheral vascular disease)  . Mixed hyperlipidemia  . Renal artery stenosis  . Memory loss, short term  . Pulmonary nodule  . CAD (coronary artery disease)  . CHF (congestive heart failure)  . Anemia  . Osteoporosis    Past Medical History:  Past Medical History  Diagnosis Date  . Type II or unspecified type diabetes mellitus without mention of complication, not stated as uncontrolled   . PVD (peripheral vascular disease)   . Mixed hyperlipidemia   . Renal artery stenosis   . Compression fx, thoracic spine     T12  . Ischium fracture   . Memory loss, short term   . Pulmonary nodule   . CAD (coronary artery disease)   . CHF (congestive heart failure)   . Anemia   . COPD (chronic obstructive pulmonary disease)   . Pneumonia    Past Surgical History:  Past Surgical History  Procedure Date  . Aorta surgery     aortic anyuersm surgery.   . Vessel surgery     in right leg- unsure of what type of surgery  . Cholecystectomy     PT Assessment/Plan/Recommendation PT Assessment Clinical Impression Statement: Pt is a 78 y/o female that was admitted for difficulty breathing.  Pt able to ambulate but limited due to decrease in O2 with ambulation and any mobility.  Pts O2 on EOB decrease to 86% on RA and decreased to 88% on 2L with ambulation.  Pt will benefit from acute PT services to improve overall mobility and endurance to prepare for safe d/c home with 24 hour assist. PT Recommendation/Assessment: Patient will need skilled PT in the acute care venue PT Problem List: Decreased range of motion;Decreased balance;Decreased mobility;Decreased knowledge of use of DME PT Therapy Diagnosis :  Difficulty walking;Generalized weakness PT Plan PT Frequency: Min 3X/week PT Treatment/Interventions: DME instruction;Gait training;Stair training;Functional mobility training;Therapeutic activities;Therapeutic exercise;Patient/family education PT Recommendation Follow Up Recommendations: Home health PT;24 hour supervision/assistance Equipment Recommended: Rolling walker with 5" wheels (pt reported having standard walker) PT Goals  Acute Rehab PT Goals PT Goal Formulation: With patient Time For Goal Achievement: 7 days Pt will go Supine/Side to Sit: Independently PT Goal: Supine/Side to Sit - Progress: Not met Pt will go Sit to Supine/Side: Independently PT Goal: Sit to Supine/Side - Progress: Not met Pt will go Sit to Stand: with modified independence PT Goal: Sit to Stand - Progress: Not met Pt will go Stand to Sit: with modified independence PT Goal: Stand to Sit - Progress: Not met Pt will Ambulate: >150 feet;with modified independence;with rolling walker PT Goal: Ambulate - Progress: Not met  PT Evaluation Precautions/Restrictions    Prior Functioning  Home Living Lives With: Alone Receives Help From: Family;Friend(s) Type of Home: Apartment Home Layout: One level Home Access: Stairs to enter Entrance Stairs-Rails: None Entrance Stairs-Number of Steps: 1 step Bathroom Shower/Tub: Engineer, manufacturing systems: Standard Bathroom Accessibility: No Home Adaptive Equipment: Walker - rolling;Straight cane Prior Function Level of Independence: Independent with gait;Independent with transfers Able to Take Stairs?: Yes Driving: Yes Vocation: Retired Financial risk analyst Arousal/Alertness: Awake/alert Overall Cognitive Status: Appears within functional limits for tasks assessed Sensation/Coordination   Extremity Assessment RUE Assessment RUE Assessment: Within Functional Limits LUE Assessment LUE  Assessment: Within Functional Limits RLE Assessment RLE Assessment:  Within Functional Limits LLE Assessment LLE Assessment: Within Functional Limits Mobility (including Balance) Bed Mobility Bed Mobility: Yes Supine to Sit: 5: Supervision Supine to Sit Details (indicate cue type and reason): supervision for safety Transfers Transfers: Yes Sit to Stand: 4: Min assist;From bed;From chair/3-in-1 (minguard for safety) Sit to Stand Details (indicate cue type and reason): Minguard for safety with cues for hand placement Stand to Sit: To chair/3-in-1;4: Min assist (minguard for safety) Stand to Sit Details: Minguard for safety with cues for hand placement and RW placement Ambulation/Gait Ambulation/Gait: Yes Ambulation/Gait Assistance: 4: Min assist (minguard for safety) Ambulation/Gait Assistance Details (indicate cue type and reason): Minguard for safety with cues for RW placement and manage RW around objects. Ambulation Distance (Feet): 100 Feet Assistive device: Rolling walker Gait Pattern: Shuffle Gait velocity: .96  Balance Balance Assessed: Yes Static Sitting Balance Static Sitting - Balance Support: Feet supported Static Sitting - Level of Assistance: 6: Modified independent (Device/Increase time) Exercise    End of Session PT - End of Session Equipment Utilized During Treatment: Gait belt;Other (comment) (2L O2) Activity Tolerance: Patient tolerated treatment well Patient left: in chair;with call bell in reach Nurse Communication: Mobility status for transfers;Mobility status for ambulation General Behavior During Session: [facility] for tasks performed Cognition: [facility] for tasks performed  Rual Vermeer 02/19/2011, 1:26 PM [phone]

## 2011-02-19 NOTE — Progress Notes (Signed)
Family Medicine Teaching Service Attending Note  I discussed patient Suzanne [name]  with Dr. Aviva Signs and reviewed their note for today.  I agree with their assessment and plan.

## 2011-02-19 NOTE — Progress Notes (Signed)
Order for home oxygen received from MD, this CM noted no documentation of O2 sats on room air and O2 flow rate required to bring oxygen sats to > 88%.  MD notified and will ask pt RN to obtain this information.  Johny Shock RN MPH Case Manager [phone]

## 2011-02-19 NOTE — Progress Notes (Signed)
ANTICOAGULATION CONSULT NOTE - Follow Up Consult  Pharmacy Consult for Coumadin Indication: hx DVT  Assessment: 78 yo female on Coumadin PTA for hx DVT. INR below goal on admit due to several missed doses. Patient also on Avelox which may increase INR.  INR remains subtherapeutic today at 1.97, but is increasing towards goal. No bleeding issues reported. Home dose is 2.5mg  qTuWThSS & 1.25mg  qMF.  Pharmacist System-Based Medication Review: Admit Complaint: viral symptoms of cough/increased mucus production and body aches x 1 week-evaluated at urgent care and dx with bilateral pneumonia.   Infectious Disease: Pneumonia: D#3 Avelox. Afebrile. No cultures. Cardiovascular: Hypertensive. Hx CHF, PVD, HLD, CAD. On Lasix po, losartan, toprol, Crestor. EF 55-60% Endocrinology: Hx DM. CBGs 196-298 on SSI, Lantus 14 units. Note pt on Prednisone 60mg  qday. Nephrology: SCr 1.35 today - appears baseline SCr 36yr ago was ~1. Renal artery stenosis (caution with ACE/ARB - on losartan PTA) est CrCl~72mL/min Pulmonary: b/l PNA vs. COPD exacerbation; on alb/ipra nebs, prednisone. Symbicort PTA Hematology / Oncology: CBC ok PTA Medication Issues: Symbicort, ferrous sulfate, fish oil, vitamins not resumed Best Practices: Coumadin for VTE px, Protonix po  Goal of Therapy:  INR 2-3   Plan:  1. Coumadin 2.5 mg po x1 today. Check INR in AM. 2. If discharged, recommend resuming her home Coumadin regimen and checking her INR on Friday 1/4 or Monday 1/7.  Estella Husk, Pharm.D., BCPS Clinical Pharmacist  Pager [phone] 02/19/2011, 10:41 AM    Allergies  Allergen Reactions  . Codeine Other (See Comments)    hallucinations  . Lyrica Other (See Comments)    unknown    Patient Measurements: Height: 5\' 2"  (157.5 cm) Weight: 154 lb (69.854 kg) (scale A) IBW/kg (Calculated) : 50.1   Vital Signs: Temp: 97.3 F (36.3 C) (01/02 0451) Temp src: Oral (01/02 0451) BP: 154/79 mmHg (01/02 0451) Pulse  Rate: 66  (01/02 0451)  Labs:  Basename 02/19/11 0503 02/18/11 0550 02/17/11 0500 02/16/11 2227  HGB -- 12.0 -- 12.2  HCT -- 37.7 -- 37.4  PLT -- 169 -- 157  APTT -- -- -- --  LABPROT 22.8* 21.3* 17.0* --  INR 1.97* 1.81* 1.36 --  HEPARINUNFRC -- -- -- --  CREATININE 1.35* 1.34* -- 1.30*  CKTOTAL -- -- -- --  CKMB -- -- -- --  TROPONINI -- -- -- --   Estimated Creatinine Clearance: 26.9 ml/min (by C-G formula based on Cr of 1.35).   Medications:  Scheduled:     . albuterol  2.5 mg Nebulization TID  . furosemide  20 mg Oral Daily  . guaiFENesin  600 mg Oral BID  . insulin aspart  0-9 Units Subcutaneous TID WC  . insulin glargine  14 Units Subcutaneous Daily  . ipratropium  0.5 mg Nebulization TID  . losartan  100 mg Oral Daily  . methylPREDNISolone (SOLU-MEDROL) injection  40 mg Intravenous Once  . metoprolol succinate  50 mg Oral Daily  . metoprolol tartrate  25 mg Oral Once  . moxifloxacin  400 mg Oral q1800  . pantoprazole  40 mg Oral Daily  . predniSONE  60 mg Oral Q breakfast  . pyridOXINE  100 mg Oral Daily  . rosuvastatin  20 mg Oral QHS  . sodium chloride  3 mL Intravenous Q12H  . warfarin  2.5 mg Oral ONCE-1800  . DISCONTD: furosemide  40 mg Oral Daily  . DISCONTD: insulin glargine  10 Units Subcutaneous Daily  . DISCONTD: metoprolol succinate  25 mg Oral  Daily

## 2011-02-19 NOTE — Progress Notes (Signed)
IV d/c'd. Tele d/c'd. Pt d/c'd to home. Home meds and d/c instructions discussed with pt and pt daughter. Both deny any questions or concerns at this time. Pt leaving unit via wheelchair and appears in no acute distress.  Nino Glow RN

## 2011-02-19 NOTE — Progress Notes (Signed)
   CARE MANAGEMENT NOTE HEART FAILURE  02/19/2011   Patient:  Suzanne [name], Suzanne [name]   Account Number:  [id number]    Date Initiated:  02/17/2011  Documentation initiated by:  Shannan Harper  Subjective/Objective Assessment:   Patient admitted with worsening SOB at urgent care found to have bilateral pna.  Hx of CHF.   Action/Plan:   Discharge back to home with self care.  Patient lived alone PTA.   Anticipated DC Date:  02/19/2011  Anticipated DC Plan:  HOME/SELF CARE  DC Planning Services:  CM consult    PAC Choice:  DURABLE MEDICAL EQUIPMENT   Choice offered to / List presented to:  C-1 Patient DME arranged:  OXYGEN     DME agency:  [facility]   [facility] arranged:  HH - 11 Patient Refused      Status of service:  Completed, signed off  Medicare Important Message Given:   (If response is "NO", the following Medicare IM given date fields will be blank) Date Medicare IM Given:   Date Additional Medicare IM Given:    Discharge Disposition:  HOME/SELF CARE  Per UR Regulation:  Reviewed for med. necessity/level of care/duration of stay  Comments:   Met with patient and she refuses at this time for [facility] She will be set up for [facility] oxygen and [facility] is her choice. Notified Darrian for referral.  Heart Failure Packet discussed.  Patient weighs herself daily, understands salt intake, zones.  Feels comfortable being followed by her primary care physician who has also been here during her hospitalization.  Will continue to assist as needed. 02/19/11 1453 Shannan Harper, RN, BSN  UR Initial Review Completed. 02/17/11 1458 Shannan Harper, RN, BSN   Initial CM contact:  02/19/2011 12:00 M  By:  Shannan Harper Initial CSW contact:     By:      Is this an INP Readmission < 30 days:  N (If "YES" please see readmission information at the bottom of note)  Patient living status prior to this admission:  ALONE  Patient setting prior to this admission:  HOME  Comorbid conditions being  treated that contributed to this admission:  HIGH--CAD, CHF, PVD, DM  CHF Readmission Risk:  high  Type of patient education provided  HF Patient Education Assessment / Teach Back  HF Zone Tool / Magnet  Limit salt intake  Weigh daily     Patient education provided by  Lottie Mussel    Was referral made to [facility]:  N  Is the patient's PCP the same as attending:  N PCP:    Readmission < 30 Days If pt has HH, did they contact the agency before going to the ED:   Name of [facility] agency:    Was the follow-up physician visit scheduled prior to discharge:    Did the patient follow-up with the physician prior to this readmission:    Was there HF Clinic visits prior to readmission:    Were there ED visits between admissions:    Readmit type:    If unscheduled and related indicate reason for readmit:

## 2011-02-19 NOTE — Progress Notes (Signed)
Inpatient Diabetes Program Recommendations  AACE/ADA: New Consensus Statement on Inpatient Glycemic Control (2009)  Target Ranges:  Prepandial:   less than 140 mg/dL      Peak postprandial:   less than 180 mg/dL (1-2 hours)      Critically ill patients:  140 - 180 mg/dL   CBGs 16/10: 960/ 454/ 263/ 196 mg/dl CBGs today: 098/ 119 mg/dl  Inpatient Diabetes Program Recommendations Insulin - Basal: Please increase Lantus to 16 units daily. Insulin - Meal Coverage: Please add Novolog 4 units tid with meals while pt on PO Prednisone.  Note: Pt's postprandial CBGs likely due to PO Prednisone.

## 2011-02-19 NOTE — Progress Notes (Signed)
Pt 02 checked on RA.  At rest pt 92 %. While repositioning self in chair in preparation to stand, pt 02 sats dropped as low as 80% on RA.  2L/02 via [place] applied and 02 sats returned to 92%.

## 2011-02-19 NOTE — Discharge Summary (Signed)
Physician Discharge Summary  Patient ID: Suzanne [name] MRN: [id number] DOB/AGE: May 30, 1921 78 y.o.  Admit date: 02/16/2011 Discharge date: 02/19/2011  Admission Diagnoses: Shortness of breath Hemoptysis COP systolic CHF (EF 14-78% on last ECHO), compensated  HTN DM  Discharge Diagnoses:  COPD exacerbation Diastolic CHF (EF 29-56%) HTN DM  Discharged Condition: fair  Hospital Course:  78 yo female with a known history of COPD and congestive heart failure presents with shortness of breath and small volume hemoptysis.  1. COPD exacerbation: Patient with increased work of breathing and wheezing on physical exam. Shortness of breath improved with albuterol and steroids. She was additionally treated with IV Avelox for COPD exacerbation. She had no repeat episodes of hemoptysis during her hospital stay. 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 The patient's O2 saturation fell to the mid 80s at rest on room air. It was determined that the patient would best benefit from home oxygen. She was sent home with 2 L of oxygen.  2.  Systolic CHF exacerbation: It was unclear whether a  CHF exacerbation was also contributing to the patient's decline in respiratory status. She intermittently had what sounded like rales on physical exam  but her chest x-ray was negative for pleural effusions and she had no peripheral edema. Her pro BNP was 725. The patient received one day of increased Lasix dose 40 mg by mouth and was then transitioned back to her home lasix dose of 20 mg by mouth daily. Repeat ECHO revealed normal systolic function and an ejection fraction of 55-60%. This is significantly improved from her last ECHO which revealed an ejection fraction of 20-25% . Ultimately, it appeared that her CHF was compensated and more consistent with grade 1 diastolic dysfunction.     Consults: none  Significant  Diagnostic Studies: labs: BNP 725, A1c 6.8, creatinine 1.35. Chest x-ray: Emphysematous changes and scattered fibrosis in the lungs. No evidence of active consolidation. No pleural effusions.   Echocardiogram: Moderate LVH. Normal systolic function.  Ejection fraction was 55-60%. Grade 1 diastolic dysfunction.   Treatments: antibiotics: Avelox, cardiac meds: furosemide and steroids: prednisone  Discharge Exam: Blood pressure 164/80, pulse 67, temperature 97.3 F (36.3 C), temperature source Oral, resp. rate 22, height 5\' 2"  (1.575 m), weight 154 lb (69.854 kg), SpO2 93.00%. General appearance: alert, cooperative and no distress Head: Normocephalic, without obvious abnormality, atraumatic Neck: no adenopathy, no carotid bruit, no JVD, supple, symmetrical, trachea midline and thyroid not enlarged, symmetric, no tenderness/mass/nodules Resp: mild increased WOB when changing positions. Scattered expiratory wheezing. No rales.  Chest wall: no tenderness Cardio: regular rate and rhythm, S1, S2 normal, no murmur, click, rub or gallop GI: soft, non-tender; bowel sounds normal; no masses,  no organomegaly Extremities: extremities normal, atraumatic, no cyanosis or edema Skin: Skin color, texture, turgor normal. No rashes or lesions Neurologic: Grossly normal  Disposition: to home/self care with home O2.   Discharge Orders    Future Appointments: Provider: Department: Dept Phone: Center:   03/18/2011 2:00 PM Lucilla Edin, MD [facility] [phone] [facility]     Medication List  As of 02/19/2011 12:26 PM   START taking these medications         guaiFENesin 600 MG 12 hr tablet   Commonly known as: MUCINEX   Take 1 tablet (600 mg total) by mouth 2 (two) times daily.      levofloxacin 500 MG tablet  Commonly known as: LEVAQUIN   Take 1 tablet (500 mg total) by mouth daily.      predniSONE 20 MG tablet   Commonly known as: DELTASONE   Take 2 tablets (40 mg total) by mouth daily with  breakfast.         CONTINUE taking these medications         albuterol-ipratropium 18-103 MCG/ACT inhaler   Commonly known as: COMBIVENT      budesonide-formoterol 160-4.5 MCG/ACT inhaler   Commonly known as: SYMBICORT      calcium-vitamin D 500-200 MG-UNIT per tablet   Commonly known as: OSCAL WITH D      esomeprazole 40 MG capsule   Commonly known as: NEXIUM      ferrous sulfate 325 (65 FE) MG tablet      FISH OIL PO      furosemide 20 MG tablet   Commonly known as: LASIX      ibandronate 150 MG tablet   Commonly known as: BONIVA      LANTUS SOLOSTAR 100 UNIT/ML injection   Generic drug: insulin glargine      losartan 100 MG tablet   Commonly known as: COZAAR      metoprolol succinate 25 MG 24 hr tablet   Commonly known as: TOPROL-XL      pyridOXINE 100 MG tablet   Commonly known as: VITAMIN B-6      rosuvastatin 20 MG tablet   Commonly known as: CRESTOR      warfarin 2.5 MG tablet   Commonly known as: COUMADIN          Where to get your medications    These are the prescriptions that you need to pick up. We sent them to a specific pharmacy, so you will need to go there to get them.   [facility] - [place], [place] - [address]    [address] [place] [address]    Phone: [phone]        guaiFENesin 600 MG 12 hr tablet   levofloxacin 500 MG tablet   predniSONE 20 MG tablet           Follow-up Information    Follow up with DAUB,STEVE A, MD. Call in 1 week.   Contact information:   [address] [place] [address] [phone]          Signed: Dessa Phi 03/05/2011, 9:59 AM

## 2011-02-25 ENCOUNTER — Encounter: Payer: Self-pay | Admitting: *Deleted

## 2011-02-25 ENCOUNTER — Ambulatory Visit (INDEPENDENT_AMBULATORY_CARE_PROVIDER_SITE_OTHER): Payer: Medicare Other | Admitting: Emergency Medicine

## 2011-02-25 DIAGNOSIS — J4489 Other specified chronic obstructive pulmonary disease: Secondary | ICD-10-CM

## 2011-02-25 DIAGNOSIS — J449 Chronic obstructive pulmonary disease, unspecified: Secondary | ICD-10-CM

## 2011-02-25 DIAGNOSIS — I509 Heart failure, unspecified: Secondary | ICD-10-CM

## 2011-02-27 ENCOUNTER — Other Ambulatory Visit (HOSPITAL_COMMUNITY): Payer: Self-pay | Admitting: Family Medicine

## 2011-02-27 NOTE — Telephone Encounter (Signed)
Refill request

## 2011-02-28 ENCOUNTER — Ambulatory Visit (INDEPENDENT_AMBULATORY_CARE_PROVIDER_SITE_OTHER): Payer: Medicare Other

## 2011-02-28 DIAGNOSIS — E119 Type 2 diabetes mellitus without complications: Secondary | ICD-10-CM

## 2011-02-28 DIAGNOSIS — J449 Chronic obstructive pulmonary disease, unspecified: Secondary | ICD-10-CM

## 2011-02-28 DIAGNOSIS — R29898 Other symptoms and signs involving the musculoskeletal system: Secondary | ICD-10-CM

## 2011-03-04 ENCOUNTER — Ambulatory Visit: Payer: Medicare Other | Admitting: Emergency Medicine

## 2011-03-06 NOTE — Discharge Summary (Signed)
I have reviewed this discharge summary and agree.    

## 2011-03-18 ENCOUNTER — Ambulatory Visit (INDEPENDENT_AMBULATORY_CARE_PROVIDER_SITE_OTHER): Payer: Medicare Other | Admitting: Emergency Medicine

## 2011-03-18 ENCOUNTER — Encounter: Payer: Self-pay | Admitting: Emergency Medicine

## 2011-03-18 DIAGNOSIS — E119 Type 2 diabetes mellitus without complications: Secondary | ICD-10-CM

## 2011-03-18 DIAGNOSIS — J449 Chronic obstructive pulmonary disease, unspecified: Secondary | ICD-10-CM | POA: Insufficient documentation

## 2011-03-18 DIAGNOSIS — N058 Unspecified nephritic syndrome with other morphologic changes: Secondary | ICD-10-CM

## 2011-03-18 DIAGNOSIS — E1329 Other specified diabetes mellitus with other diabetic kidney complication: Secondary | ICD-10-CM

## 2011-03-18 DIAGNOSIS — E0822 Diabetes mellitus due to underlying condition with diabetic chronic kidney disease: Secondary | ICD-10-CM

## 2011-03-18 DIAGNOSIS — I4891 Unspecified atrial fibrillation: Secondary | ICD-10-CM

## 2011-03-18 DIAGNOSIS — N289 Disorder of kidney and ureter, unspecified: Secondary | ICD-10-CM

## 2011-03-18 LAB — BASIC METABOLIC PANEL
BUN: 38 mg/dL — ABNORMAL HIGH (ref 6–23)
CO2: 26 mEq/L (ref 19–32)
Chloride: 100 mEq/L (ref 96–112)
Glucose, Bld: 129 mg/dL — ABNORMAL HIGH (ref 70–99)
Potassium: 4.4 mEq/L (ref 3.5–5.3)
Sodium: 138 mEq/L (ref 135–145)

## 2011-03-18 MED ORDER — INSULIN GLARGINE 100 UNIT/ML ~~LOC~~ SOLN: 20.0000 [IU] | Freq: Every day | SUBCUTANEOUS | Status: DC

## 2011-03-18 NOTE — Progress Notes (Signed)
  Subjective:    Patient ID: Suzanne [name], female    DOB: [birth year], 78 y.o.   MRN: [id number]  Diabetes She has type 2 diabetes mellitus. Her disease course has been fluctuating. There are no hypoglycemic associated symptoms. Associated symptoms include fatigue, foot paresthesias and weakness. Pertinent negatives for diabetes include no blurred vision, no chest pain, no foot ulcerations, no polydipsia, no polyphagia, no polyuria, no visual change and no weight loss. Symptoms are improving. Diabetic complications include heart disease, nephropathy, peripheral neuropathy and PVD. Current diabetic treatment includes insulin injections. She is compliant with treatment all of the time.      Review of Systems  Constitutional: Positive for fatigue. Negative for weight loss.  Eyes: Negative for blurred vision.  Cardiovascular: Negative for chest pain.  Genitourinary: Negative for polyuria.  Neurological: Positive for weakness.  Hematological: Negative for polydipsia and polyphagia.       Objective:   Physical Exam  Constitutional: She appears well-developed and well-nourished.  Eyes: Pupils are equal, round, and reactive to light.  Cardiovascular: Normal rate.   Pulmonary/Chest: She has wheezes. She has rales.  Abdominal: Soft.  Musculoskeletal: She exhibits edema.       both legs swollen.          Assessment & Plan:  Will try and elevate legs. Will continue to take extra lasix when weight goes up

## 2011-03-18 NOTE — Patient Instructions (Signed)
Continue lantus 20 units. Try and order hospital bed. Keep legs elevated. F/u here 3 weeks. Use neb twice a day with puffer in between.

## 2011-04-08 ENCOUNTER — Ambulatory Visit (INDEPENDENT_AMBULATORY_CARE_PROVIDER_SITE_OTHER): Payer: Medicare Other | Admitting: Emergency Medicine

## 2011-04-08 ENCOUNTER — Encounter: Payer: Self-pay | Admitting: Emergency Medicine

## 2011-04-08 DIAGNOSIS — I739 Peripheral vascular disease, unspecified: Secondary | ICD-10-CM

## 2011-04-08 DIAGNOSIS — J449 Chronic obstructive pulmonary disease, unspecified: Secondary | ICD-10-CM

## 2011-04-08 DIAGNOSIS — R0902 Hypoxemia: Secondary | ICD-10-CM

## 2011-04-08 DIAGNOSIS — E119 Type 2 diabetes mellitus without complications: Secondary | ICD-10-CM

## 2011-04-08 LAB — GLUCOSE, POCT (MANUAL RESULT ENTRY): POC Glucose: 123

## 2011-04-08 NOTE — Progress Notes (Signed)
  Subjective:    Patient ID: Suzanne [name], female    DOB: 03/02/1921, 78 y.o.   MRN: [id number]  HPI patient has been doing relatively well at home. Her sugars are much better controlled running about 110-140. She continues on oxygen 2 L continuously. She has been having some trouble with nosebleeds from the left side of her nose. She still gets very short of breath with minimal exertion.    Review of Systems swelling in her legs has improved. Her breathing has remained about the same. She is tolerating her hospital bed at home. Her daughter continues to stay with her.     Objective:   Physical Exam  Constitutional: She appears well-developed.  Eyes: Pupils are equal, round, and reactive to light.  Neck: No tracheal deviation present. No thyromegaly present.  Cardiovascular: Normal rate and regular rhythm.  Exam reveals no gallop and no friction rub.   Pulmonary/Chest:       Respiratory exam reveals decreased breath sounds in the bases. There is an increased AP diameter. There is a prolonged expiratory phase. No rales were heard  Skin:       The skin over both lower legs is atrophic. Pulses are absent in the lower extremities. There does not appear to be any necrosis of the toes.          Assessment & Plan:  Overall patient is doing pretty well. She wants to do more but is limited by her hypoxia. Caring her oxygen with her wherever she goes.  She is still upset her daughter spends the night.

## 2011-05-06 ENCOUNTER — Ambulatory Visit (INDEPENDENT_AMBULATORY_CARE_PROVIDER_SITE_OTHER): Payer: Medicare Other | Admitting: Emergency Medicine

## 2011-05-06 DIAGNOSIS — J4489 Other specified chronic obstructive pulmonary disease: Secondary | ICD-10-CM

## 2011-05-06 DIAGNOSIS — N289 Disorder of kidney and ureter, unspecified: Secondary | ICD-10-CM

## 2011-05-06 DIAGNOSIS — J449 Chronic obstructive pulmonary disease, unspecified: Secondary | ICD-10-CM

## 2011-05-06 DIAGNOSIS — N058 Unspecified nephritic syndrome with other morphologic changes: Secondary | ICD-10-CM

## 2011-05-06 DIAGNOSIS — R0902 Hypoxemia: Secondary | ICD-10-CM

## 2011-05-06 DIAGNOSIS — E119 Type 2 diabetes mellitus without complications: Secondary | ICD-10-CM

## 2011-05-06 NOTE — Progress Notes (Signed)
  Subjective:    Patient ID: Suzanne [name], female    DOB: 1921-12-26, 78 y.o.   MRN: [id number]  HPI patient in for recheck of her diabetes and COPD. She also has renal disease and is scheduled to see the renal specialist next week. Her breathing has been much better. Her diabetes has been under great control. Happy with her progress.    Review of Systems of systems essentially noncontributory. The swelling in her legs has markedly improved. She is using her oxygen at night and intermittently during the day. She's had some pain along the right costal margin which is associated with changes in position.     Objective:   Physical Exam  Constitutional: She appears well-nourished.  HENT:  Head: Normocephalic.  Eyes: Pupils are equal, round, and reactive to light.  Neck: No JVD present. No tracheal deviation present. No thyromegaly present.  Cardiovascular: Normal rate, regular rhythm and normal heart sounds.  Exam reveals no gallop and no friction rub.   No murmur heard. Pulmonary/Chest: She has wheezes. She exhibits tenderness.       Pulse ox was 96 off of O2. She had mild tenderness along the right costal margin. She is diminished breath sounds in the bases. No areas of dullness.  Musculoskeletal: She exhibits no edema.  Lymphadenopathy:    She has no cervical adenopathy.          Assessment & Plan:   Is stable from a cardiovascular standpoint. Her respiratory status is markedly improved. She brings in a sheet of her glucoses which have been between 100 and 120 which is excellent control for her .

## 2011-06-10 ENCOUNTER — Ambulatory Visit (INDEPENDENT_AMBULATORY_CARE_PROVIDER_SITE_OTHER): Payer: Medicare Other | Admitting: Emergency Medicine

## 2011-06-10 ENCOUNTER — Encounter: Payer: Self-pay | Admitting: Emergency Medicine

## 2011-06-10 DIAGNOSIS — E119 Type 2 diabetes mellitus without complications: Secondary | ICD-10-CM

## 2011-06-10 DIAGNOSIS — I739 Peripheral vascular disease, unspecified: Secondary | ICD-10-CM

## 2011-06-10 DIAGNOSIS — I7090 Unspecified atherosclerosis: Secondary | ICD-10-CM

## 2011-06-10 DIAGNOSIS — J449 Chronic obstructive pulmonary disease, unspecified: Secondary | ICD-10-CM

## 2011-06-10 NOTE — Progress Notes (Signed)
  Subjective:    Patient ID: Suzanne [name], female    DOB: February 01, 1922, 78 y.o.   MRN: [id number]  HPI patient here to followup on her diabetes and COPD she is doing remarkably better since her hospitalization earlier in the year she is only using her O2 at night to her sugars have been much better controlled recently on Lantus. She currently is at 20 units a day. Her sugars have been ranging from 80-120. She has been to the nephrologist who felt her renal disease is stable. She is due to have Doppler studies of her lower extremities to followup on her PAD. She has developed a small ulcer medial left leg but otherwise has been doing pretty well. She has been able to go out more but does have some trouble with the pollen with worsening of her COPD.    Review of Systems     Objective:   Physical Exam status looks well today the exam revealed diminished breath sounds in the bases without wheezes. She's currently in a sinus rhythm her abdomen was soft she has a 2 mm venous stasis ulcer developing medial left leg.        Assessment & Plan:  Overall Suzanne [name] is doing well. She is going to be increased her nebs to 3 times a day during the pollen season. Stay and more until palm has decreased. Recheck in 3 months diabetes has done exceptionally well on Lantus

## 2011-06-11 ENCOUNTER — Telehealth: Payer: Self-pay

## 2011-06-11 NOTE — Telephone Encounter (Signed)
I had received a call from [place] at [facility] last wk asking about a form to be signed for pts cert of med necessity for her O2, and she said she would fax over the form. Checked w/ Med Recs sev times and we did not receive fax. Called [facility] O2 department and told them we have not received fax, and that if they will fax it we will be glad to take care of it for pt. Received fax and will put in Dr Ellis Parents box to sign.

## 2011-06-16 NOTE — Telephone Encounter (Signed)
Dr Cleta Alberts wrote message on ppw for pts "medical nec" for her O2 that he thought it had already been done. Checked chart and form had been faxed into [facility] on 05/23/11. Called [facility] to see if they had received and if they still needed the current form to be sent in. Tacey Ruiz reported that she doesn't see the fax from 4/5 and asked [place] to refax current form. Completed and faxed signed form to [facility].

## 2011-06-17 ENCOUNTER — Telehealth: Payer: Self-pay

## 2011-06-17 NOTE — Telephone Encounter (Signed)
[facility] O2 department called and stated that the form that they had sent Dr Cleta Alberts to fill out and sign for Medicare did not have the same info that the attached form they had sent with it which is the numbers we were supposed to fill in on the new order. I explained to her that we had filled in the form answering the ?s according to our records from pt's visit here. She stated that Medicare will not approve it unless the records that were originally recorded from the hospital are listed on new form. The original documentation/evaluation was done by Dr Sheffield Slider a hospitalist at [facility]. She will fax [place] a new form and we can check records from hosp if needed. Dr Cleta Alberts, I am forwarding this to you in case the form goes to your box instead of RN desk.

## 2011-06-23 ENCOUNTER — Other Ambulatory Visit: Payer: Self-pay | Admitting: Emergency Medicine

## 2011-06-23 DIAGNOSIS — Z1231 Encounter for screening mammogram for malignant neoplasm of breast: Secondary | ICD-10-CM

## 2011-06-25 NOTE — Telephone Encounter (Signed)
Called [facility] back yesterday to tell them we have not received the form they were going to refax. Asked for refax of form so that we can take care of this for pt. Received form today and filled out. Dr Cleta Alberts signed and refaxed to [facility] w/confirmation.

## 2011-07-29 ENCOUNTER — Ambulatory Visit (INDEPENDENT_AMBULATORY_CARE_PROVIDER_SITE_OTHER): Payer: Medicare Other | Admitting: Emergency Medicine

## 2011-07-29 ENCOUNTER — Encounter: Payer: Self-pay | Admitting: Emergency Medicine

## 2011-07-29 VITALS — BP 121/52 | HR 78 | Temp 97.3°F | Resp 16 | Ht 63.0 in | Wt 155.4 lb

## 2011-07-29 DIAGNOSIS — J4489 Other specified chronic obstructive pulmonary disease: Secondary | ICD-10-CM

## 2011-07-29 DIAGNOSIS — E119 Type 2 diabetes mellitus without complications: Secondary | ICD-10-CM

## 2011-07-29 DIAGNOSIS — J449 Chronic obstructive pulmonary disease, unspecified: Secondary | ICD-10-CM

## 2011-07-29 NOTE — Progress Notes (Signed)
  Subjective:    Patient ID: Suzanne [name], female    DOB: 12-16-1921, 78 y.o.   MRN: [id number]  HPI patient in for followup of her COPD and diabetes she is having episodes at night where she wakes up about 2 AM short of breath. She can't remember whether she takes her Symbicort at that time or not. She has a long history of reflux and takes Nexium but was out of it for 2 or 3 days    Review of Systems     Objective:   Physical Exam Kateena looks really good today. She's alert cooperative. Her neck was supple. Chest exam revealed decreased breath sounds in the bases but no audible wheezing. Cardiac exam revealed a regular rate. Examination of the extremities reveals significant peripheral arterial disease but no skin break        Assessment & Plan:  Comparison 7 episodes at night with shortness of breath. I'm not sure if this is due to not using her Symbicort or whether she is having reflux triggered cough at night. She is going to get a calendar and Loraine Leriche whether she takes her Symbicort at bedtime she is going bad Zantac 150 mg take at bedtime. She is going to have her PT/INR drawn today so I wrote prescription for her glucose A1c and cmet to be done at the lab.

## 2011-08-01 LAB — PROTIME-INR: Protime: 29.3 seconds — AB (ref 10.0–13.8)

## 2011-08-01 LAB — HEMOGLOBIN A1C: Hgb A1c MFr Bld: 6.8 % — AB (ref 4.0–6.0)

## 2011-08-01 LAB — HEPATIC FUNCTION PANEL
ALT: 12 U/L (ref 7–35)
Alkaline Phosphatase: 51 U/L (ref 25–125)
Bilirubin, Total: 0.3 mg/dL

## 2011-08-01 LAB — BASIC METABOLIC PANEL
BUN: 37 mg/dL — AB (ref 4–21)
Creatinine: 1.5 mg/dL — AB (ref 0.5–1.1)
Glucose: 100 mg/dL

## 2011-08-04 ENCOUNTER — Ambulatory Visit
Admission: RE | Admit: 2011-08-04 | Discharge: 2011-08-04 | Disposition: A | Discharge status: Home / Self Care | Payer: Medicare Other | Source: Ambulatory Visit | Attending: Emergency Medicine | Admitting: Emergency Medicine

## 2011-08-04 DIAGNOSIS — Z1231 Encounter for screening mammogram for malignant neoplasm of breast: Secondary | ICD-10-CM

## 2011-08-07 ENCOUNTER — Telehealth: Payer: Self-pay | Admitting: Radiology

## 2011-08-07 ENCOUNTER — Other Ambulatory Visit: Payer: Self-pay | Admitting: Emergency Medicine

## 2011-08-07 DIAGNOSIS — R928 Other abnormal and inconclusive findings on diagnostic imaging of breast: Secondary | ICD-10-CM

## 2011-08-07 NOTE — Telephone Encounter (Signed)
Message copied by Luretha Murphy on Thu Aug 07, 2011  1:54 PM ------      Message from: Lesle Chris A      Created: Thu Aug 07, 2011  7:39 AM       Morrie Sheldon please call Octa and make sure she is scheduled for further images of her breast. Also call her daughter and make sure she is aware her mammogram is abnormal and needs repeat views of that

## 2011-08-07 NOTE — Telephone Encounter (Signed)
lmom to cb. 

## 2011-08-14 ENCOUNTER — Ambulatory Visit
Admission: RE | Admit: 2011-08-14 | Discharge: 2011-08-14 | Disposition: A | Discharge status: Home / Self Care | Payer: Medicare Other | Source: Ambulatory Visit | Attending: Emergency Medicine | Admitting: Emergency Medicine

## 2011-08-14 DIAGNOSIS — R928 Other abnormal and inconclusive findings on diagnostic imaging of breast: Secondary | ICD-10-CM

## 2011-09-12 ENCOUNTER — Encounter: Payer: Self-pay | Admitting: Emergency Medicine

## 2011-09-23 ENCOUNTER — Encounter: Payer: Self-pay | Admitting: Emergency Medicine

## 2011-09-23 ENCOUNTER — Ambulatory Visit (INDEPENDENT_AMBULATORY_CARE_PROVIDER_SITE_OTHER): Payer: Medicare Other | Admitting: Emergency Medicine

## 2011-09-23 VITALS — BP 154/66 | HR 71 | Temp 97.9°F | Resp 16 | Ht 62.5 in | Wt 157.2 lb

## 2011-09-23 DIAGNOSIS — M810 Age-related osteoporosis without current pathological fracture: Secondary | ICD-10-CM

## 2011-09-23 DIAGNOSIS — E119 Type 2 diabetes mellitus without complications: Secondary | ICD-10-CM

## 2011-09-23 DIAGNOSIS — D649 Anemia, unspecified: Secondary | ICD-10-CM

## 2011-09-23 DIAGNOSIS — IMO0001 Reserved for inherently not codable concepts without codable children: Secondary | ICD-10-CM

## 2011-09-23 DIAGNOSIS — O2230 Deep phlebothrombosis in pregnancy, unspecified trimester: Secondary | ICD-10-CM

## 2011-09-23 DIAGNOSIS — E785 Hyperlipidemia, unspecified: Secondary | ICD-10-CM

## 2011-09-23 DIAGNOSIS — I7090 Unspecified atherosclerosis: Secondary | ICD-10-CM

## 2011-09-23 DIAGNOSIS — G629 Polyneuropathy, unspecified: Secondary | ICD-10-CM

## 2011-09-23 DIAGNOSIS — J449 Chronic obstructive pulmonary disease, unspecified: Secondary | ICD-10-CM

## 2011-09-23 DIAGNOSIS — I10 Essential (primary) hypertension: Secondary | ICD-10-CM

## 2011-09-23 MED ORDER — RANITIDINE HCL 150 MG PO TABS: 150.0000 mg | ORAL_TABLET | Freq: Two times a day (BID) | ORAL | Status: DC

## 2011-09-23 MED ORDER — LOSARTAN POTASSIUM 100 MG PO TABS: 100.0000 mg | ORAL_TABLET | Freq: Every day | ORAL | Status: DC

## 2011-09-23 MED ORDER — ESOMEPRAZOLE MAGNESIUM 40 MG PO CPDR: 40.0000 mg | DELAYED_RELEASE_CAPSULE | Freq: Every day | ORAL | Status: DC

## 2011-09-23 MED ORDER — LIDOCAINE 5 % EX PTCH: 1.0000 | MEDICATED_PATCH | CUTANEOUS | Status: AC

## 2011-09-23 MED ORDER — METOPROLOL SUCCINATE ER 25 MG PO TB24: 25.0000 mg | ORAL_TABLET | Freq: Every day | ORAL | Status: DC

## 2011-09-23 MED ORDER — CALCIUM CARBONATE-VITAMIN D 500-200 MG-UNIT PO TABS: 1.0000 | ORAL_TABLET | Freq: Two times a day (BID) | ORAL | Status: DC

## 2011-09-23 MED ORDER — FERROUS SULFATE 325 (65 FE) MG PO TABS: 325.0000 mg | ORAL_TABLET | Freq: Every day | ORAL | Status: DC

## 2011-09-23 MED ORDER — WARFARIN SODIUM 2.5 MG PO TABS: 2.5000 mg | ORAL_TABLET | Freq: Every day | ORAL | Status: DC

## 2011-09-23 MED ORDER — IBANDRONATE SODIUM 150 MG PO TABS: 150.0000 mg | ORAL_TABLET | ORAL | Status: DC

## 2011-09-23 MED ORDER — WARFARIN SODIUM 2.5 MG PO TABS: 2.5000 mg | ORAL_TABLET | ORAL | Status: DC

## 2011-09-23 MED ORDER — VITAMIN B-6 100 MG PO TABS: 100.0000 mg | ORAL_TABLET | Freq: Every day | ORAL | Status: DC

## 2011-09-23 MED ORDER — ALBUTEROL SULFATE (2.5 MG/3ML) 0.083% IN NEBU: 2.5000 mg | INHALATION_SOLUTION | Freq: Four times a day (QID) | RESPIRATORY_TRACT | Status: DC | PRN

## 2011-09-23 MED ORDER — IPRATROPIUM-ALBUTEROL 20-100 MCG/ACT IN AERS: 1.0000 | INHALATION_SPRAY | Freq: Four times a day (QID) | RESPIRATORY_TRACT | Status: DC

## 2011-09-23 MED ORDER — INSULIN GLARGINE 100 UNIT/ML ~~LOC~~ SOLN: 20.0000 [IU] | Freq: Every day | SUBCUTANEOUS | Status: DC

## 2011-09-23 MED ORDER — FUROSEMIDE 20 MG PO TABS: 20.0000 mg | ORAL_TABLET | ORAL | Status: DC

## 2011-09-23 MED ORDER — BUDESONIDE-FORMOTEROL FUMARATE 160-4.5 MCG/ACT IN AERO: 2.0000 | INHALATION_SPRAY | Freq: Two times a day (BID) | RESPIRATORY_TRACT | Status: DC

## 2011-09-23 MED ORDER — ROSUVASTATIN CALCIUM 20 MG PO TABS: 20.0000 mg | ORAL_TABLET | Freq: Every day | ORAL | Status: DC

## 2011-09-23 NOTE — Progress Notes (Signed)
  Subjective:    Patient ID: Suzanne [name], female    DOB: [birth year], 78 y.o.   MRN: [id number]  HPI patient returns for followup with her daughter. Her sugars have been running 80-100 on Lantus 20 units. She still uses her oxygen most of the time but is able to get around some without oxygen. She has been unable to start any type of exercise program due to her oxygen requirement. She denies chest pain. She's had no recent increase in her ankle edema. She continues to see Dr. Gery Pray on a regular basis    Review of Systems     Objective:   Physical Exam Zaela looks great today. Her chest exam was clear. Cardiac exam was regular rate without murmurs. Extremity exam reveals evidence of peripheral vascular disease but no tissue breakdown.        Assessment & Plan:  Patient is stable on current medication regimen. Meds will be refilled.

## 2011-09-24 ENCOUNTER — Telehealth: Payer: Self-pay

## 2011-09-24 DIAGNOSIS — O2230 Deep phlebothrombosis in pregnancy, unspecified trimester: Secondary | ICD-10-CM

## 2011-09-24 MED ORDER — WARFARIN SODIUM 2.5 MG PO TABS: 2.5000 mg | ORAL_TABLET | Freq: Every day | ORAL | Status: DC

## 2011-09-24 MED ORDER — IBANDRONATE SODIUM 150 MG PO TABS: 150.0000 mg | ORAL_TABLET | ORAL | Status: DC

## 2011-09-24 MED ORDER — ROSUVASTATIN CALCIUM 20 MG PO TABS: 20.0000 mg | ORAL_TABLET | Freq: Every day | ORAL | Status: DC

## 2011-09-24 MED ORDER — IBANDRONATE SODIUM 150 MG PO TABS: ORAL_TABLET | ORAL | Status: DC

## 2011-09-24 NOTE — Telephone Encounter (Signed)
pts daughter came in and her Rx were reviewed by [facility] and they are all with her now to take to [facility], she is taken care of and her daughter advised if she needs anything further to let me know. FYI nothing needed with this.

## 2011-09-24 NOTE — Telephone Encounter (Signed)
I have her coumadin, she will pick it up. She does not know which ones she did not get printed out, I told her I can review what she has. She is confused about what she needs, I have had Ryan sign Rx for her Coumadin, she will pick this up and take it with the others she was given yesterday but she does not know what she has and what she does not. [facility] called [facility] and the only one they got sent in was the Coumadin, it looks like the rest of them have been given to her. She will take the coumadin to the pharmacy at [facility].   FYI., please see me if you have any questions.

## 2011-09-24 NOTE — Addendum Note (Signed)
Addended by: Fernande Bras on: 09/24/2011 04:45 PM   Modules accepted: Orders

## 2011-09-24 NOTE — Telephone Encounter (Signed)
Pt left a voicemail on referrals about a medication [facility]ill that [facility] and that she usually gets her meds thru [facility]   Please call [phone]  dfb

## 2011-09-25 ENCOUNTER — Other Ambulatory Visit: Payer: Self-pay | Admitting: Radiology

## 2011-09-25 DIAGNOSIS — E119 Type 2 diabetes mellitus without complications: Secondary | ICD-10-CM

## 2011-09-25 MED ORDER — INSULIN GLARGINE 100 UNIT/ML ~~LOC~~ SOLN: 20.0000 [IU] | Freq: Every day | SUBCUTANEOUS | Status: DC

## 2011-10-01 ENCOUNTER — Encounter: Payer: Self-pay | Admitting: Emergency Medicine

## 2011-10-09 ENCOUNTER — Telehealth: Payer: Self-pay

## 2011-10-09 NOTE — Telephone Encounter (Signed)
Patient of Dr. Cleta Alberts. Got all of her Rx's but missing one. Takes her RX's to [facility] and leaving Monday. Needs her ultra fine needles for her lantus. Usually Dr. Cleta Alberts writes it for 1 year and she picks it up every 3 months. Please call when ready.

## 2011-10-10 MED ORDER — "INSULIN SYRINGE-NEEDLE U-100 30G X 1/2"" 1 ML MISC": 20.0000 [IU] | Freq: Every day | Status: DC

## 2011-10-10 NOTE — Telephone Encounter (Signed)
Rx sent to pharmacy   

## 2011-10-11 NOTE — Telephone Encounter (Signed)
Spoke with pt and advised Rx sent in. 

## 2011-11-03 ENCOUNTER — Telehealth: Payer: Self-pay

## 2011-11-03 MED ORDER — "INSULIN SYRINGE-NEEDLE U-100 30G X 1/2"" 1 ML MISC": 20.0000 [IU] | Freq: Every day | Status: DC

## 2011-11-03 NOTE — Telephone Encounter (Signed)
Med sent in and patient notified.  

## 2011-11-03 NOTE — Telephone Encounter (Signed)
Pt is about out of needles  Says she called last week for this to be done.  Getting anxious.   Please call [phone]

## 2011-11-03 NOTE — Telephone Encounter (Signed)
Pt states that [facility] states that they still have not received a rx for pt's insulin needles, pt would like to have [facility] [address] Best# [phone] [facility] [facility]

## 2011-11-03 NOTE — Telephone Encounter (Signed)
Spoke to patient advised were sent to [facility], have re sent in case they did not get the order.

## 2011-11-05 ENCOUNTER — Telehealth: Payer: Self-pay

## 2011-11-05 ENCOUNTER — Other Ambulatory Visit: Payer: Self-pay

## 2011-11-05 NOTE — Telephone Encounter (Signed)
WE CALLED IN THE WRONG NEEDLES FOR THIS PT,  SHE NEEDS THE ONES FOR THE LANTIS SOLOSTAR.  SHE IS COMPLETELY OUT OF NEEDLES.  PLEASE CALL ASAP [phone]

## 2011-11-05 NOTE — Telephone Encounter (Signed)
There is a refill request for these and they have been authorized with 2 refills.

## 2011-11-06 ENCOUNTER — Other Ambulatory Visit: Payer: Self-pay | Admitting: Family Medicine

## 2011-11-06 MED ORDER — INSULIN PEN NEEDLE 31G X 5 MM MISC: 20.0000 [IU] | Freq: Every day | Status: DC

## 2011-11-12 ENCOUNTER — Encounter: Payer: Self-pay | Admitting: Family Medicine

## 2011-11-19 ENCOUNTER — Encounter: Payer: Self-pay | Admitting: *Deleted

## 2011-11-19 DIAGNOSIS — N189 Chronic kidney disease, unspecified: Secondary | ICD-10-CM

## 2011-12-16 ENCOUNTER — Ambulatory Visit (INDEPENDENT_AMBULATORY_CARE_PROVIDER_SITE_OTHER): Payer: Medicare Other | Admitting: Emergency Medicine

## 2011-12-16 ENCOUNTER — Encounter: Payer: Self-pay | Admitting: Emergency Medicine

## 2011-12-16 VITALS — BP 154/60 | HR 81 | Temp 97.7°F | Resp 18 | Ht 63.0 in | Wt 159.8 lb

## 2011-12-16 DIAGNOSIS — J449 Chronic obstructive pulmonary disease, unspecified: Secondary | ICD-10-CM

## 2011-12-16 DIAGNOSIS — I10 Essential (primary) hypertension: Secondary | ICD-10-CM

## 2011-12-16 DIAGNOSIS — E119 Type 2 diabetes mellitus without complications: Secondary | ICD-10-CM

## 2011-12-16 DIAGNOSIS — Z23 Encounter for immunization: Secondary | ICD-10-CM

## 2011-12-16 DIAGNOSIS — D649 Anemia, unspecified: Secondary | ICD-10-CM

## 2011-12-16 DIAGNOSIS — M810 Age-related osteoporosis without current pathological fracture: Secondary | ICD-10-CM

## 2011-12-16 DIAGNOSIS — E785 Hyperlipidemia, unspecified: Secondary | ICD-10-CM

## 2011-12-16 DIAGNOSIS — I739 Peripheral vascular disease, unspecified: Secondary | ICD-10-CM

## 2011-12-16 MED ORDER — FUROSEMIDE 20 MG PO TABS: 20.0000 mg | ORAL_TABLET | ORAL | Status: DC

## 2011-12-16 MED ORDER — INSULIN PEN NEEDLE 31G X 5 MM MISC: 20.0000 [IU] | Freq: Every day | Status: DC

## 2011-12-16 MED ORDER — FISH OIL 1000 MG PO CAPS: 1000.0000 mg | ORAL_CAPSULE | Freq: Two times a day (BID) | ORAL | Status: DC

## 2011-12-16 MED ORDER — FERROUS SULFATE 325 (65 FE) MG PO TABS: 325.0000 mg | ORAL_TABLET | Freq: Every day | ORAL | Status: DC

## 2011-12-16 MED ORDER — IBANDRONATE SODIUM 150 MG PO TABS: ORAL_TABLET | ORAL | Status: DC

## 2011-12-16 MED ORDER — "INSULIN SYRINGE-NEEDLE U-100 30G X 1/2"" 1 ML MISC": 20.0000 [IU] | Freq: Every day | Status: DC

## 2011-12-16 NOTE — Patient Instructions (Signed)
No change in medications at present. He was given a flu shot today. Meds were refilled as you requested.

## 2011-12-16 NOTE — Progress Notes (Signed)
  Subjective:    Patient ID: Suzanne [name], female    DOB: 1921-05-17, 78 y.o.   MRN: [id number]  HPI patient in to recheck her COPD , anemia, diabetes, peripheral arterial disease. She is limited by her COPD. Her sugars have been running fairly good in the 03/09/1926 range in the morning. She's currently on Lantus 20 units that night. She has a renal specialist who follows her renal disease and sees Dr. Gery Pray regarding her peripheral arterial disease    Review of Systems     Objective:   Physical Exam she has with some difficulty with her memory today. She is here with her next-door neighbor. Chest exam reveals diminished breath sounds in the bases with wheezes audible. Her pulse ox resting off oxygen is 93. Cardiac exam reveals what sounds to be a regular rhythm today there is no tissue breakdown of her feet. She is dry scaling skin involving the upper extremities  Results for orders placed in visit on 12/16/11  GLUCOSE, POCT (MANUAL RESULT ENTRY)      Component Value Range   POC Glucose 129 (*) 70 - 99 mg/dl        Assessment & Plan:  Is within range today at 129. She is to have no change in her medications at present. She is limited by her COPD in what she can do. She is to continue to use O2 at night and when necessary during the day. We discussed the use of her inhaler versus use of her nebulizer at home for her COPD symptoms.

## 2011-12-22 ENCOUNTER — Encounter: Payer: Self-pay | Admitting: Vascular Surgery

## 2012-02-24 ENCOUNTER — Ambulatory Visit (INDEPENDENT_AMBULATORY_CARE_PROVIDER_SITE_OTHER): Payer: Medicare Other | Admitting: Family Medicine

## 2012-02-24 ENCOUNTER — Ambulatory Visit: Payer: Medicare Other

## 2012-02-24 VITALS — BP 122/73 | HR 73 | Temp 97.9°F | Resp 16 | Ht 63.0 in | Wt 157.0 lb

## 2012-02-24 DIAGNOSIS — Z79899 Other long term (current) drug therapy: Secondary | ICD-10-CM

## 2012-02-24 DIAGNOSIS — I10 Essential (primary) hypertension: Secondary | ICD-10-CM

## 2012-02-24 DIAGNOSIS — I739 Peripheral vascular disease, unspecified: Secondary | ICD-10-CM

## 2012-02-24 DIAGNOSIS — I509 Heart failure, unspecified: Secondary | ICD-10-CM

## 2012-02-24 DIAGNOSIS — J441 Chronic obstructive pulmonary disease with (acute) exacerbation: Secondary | ICD-10-CM

## 2012-02-24 DIAGNOSIS — R05 Cough: Secondary | ICD-10-CM

## 2012-02-24 DIAGNOSIS — R0602 Shortness of breath: Secondary | ICD-10-CM

## 2012-02-24 LAB — COMPREHENSIVE METABOLIC PANEL
ALT: 16 U/L (ref 0–35)
AST: 21 U/L (ref 0–37)
Alkaline Phosphatase: 61 U/L (ref 39–117)
Calcium: 9.1 mg/dL (ref 8.4–10.5)
Chloride: 97 mEq/L (ref 96–112)
Creat: 2.22 mg/dL — ABNORMAL HIGH (ref 0.50–1.10)

## 2012-02-24 LAB — POCT CBC
Granulocyte percent: 64.5 %G (ref 37–80)
MCH, POC: 29.2 pg (ref 27–31.2)
MID (cbc): 0.9 (ref 0–0.9)
MPV: 7.7 fL (ref 0–99.8)
POC MID %: 8.6 %M (ref 0–12)
Platelet Count, POC: 277 10*3/uL (ref 142–424)
RBC: 3.63 M/uL — AB (ref 4.04–5.48)
RDW, POC: 13.2 %
WBC: 10.8 10*3/uL — AB (ref 4.6–10.2)

## 2012-02-24 LAB — POCT INFLUENZA A/B
Influenza A, POC: NEGATIVE
Influenza B, POC: NEGATIVE

## 2012-02-24 MED ORDER — IPRATROPIUM BROMIDE 0.02 % IN SOLN
0.5000 mg | Freq: Once | RESPIRATORY_TRACT | Status: AC
  Administered 2012-02-24: 0.5 mg via RESPIRATORY_TRACT

## 2012-02-24 MED ORDER — POTASSIUM CHLORIDE CRYS ER 20 MEQ PO TBCR: 20.0000 meq | EXTENDED_RELEASE_TABLET | Freq: Every day | ORAL | Status: DC

## 2012-02-24 MED ORDER — FUROSEMIDE 40 MG PO TABS: 40.0000 mg | ORAL_TABLET | Freq: Two times a day (BID) | ORAL | Status: DC

## 2012-02-24 MED ORDER — ALBUTEROL SULFATE (2.5 MG/3ML) 0.083% IN NEBU
2.5000 mg | INHALATION_SOLUTION | Freq: Once | RESPIRATORY_TRACT | Status: AC
  Administered 2012-02-24: 2.5 mg via RESPIRATORY_TRACT

## 2012-02-24 MED ORDER — PREDNISONE 20 MG PO TABS: 40.0000 mg | ORAL_TABLET | Freq: Every day | ORAL | Status: DC

## 2012-02-24 MED ORDER — LEVOFLOXACIN 500 MG PO TABS: 500.0000 mg | ORAL_TABLET | Freq: Every day | ORAL | Status: DC

## 2012-02-24 NOTE — Progress Notes (Signed)
Subjective:    Patient ID: Suzanne [name], female    DOB: 1921-06-06, 79 y.o.   MRN: [id number] Chief Complaint  Patient presents with  . Cough    coughing up blood  . Shortness of Breath    x 5 days    HPI  Suzanne [name] is a delightful 79 yo with a PMHx of COPD and CHF who presents c/o exacerbations of these with her daughter.  She and her daughter reports she has had prev episodes of CHF exacerbations yearly for the past sev yrs and this is similar. On Thursday she developed nasal congestion and cough prod of clear sputum. No f/c. Thought she had the flu. 2d ago her sputum turned light pink and dev nose bleeds.  Her legs started swelling 4-5d prev and SHoB is worse than baseline.  Using combivent and symbicort sched bid and using albulizer neb 2-3x/d at baseline - no increase in use even though she needs to as she has a hard time holding up the neb for 15 - 20 min so needs someone to help.  Usually sleeps in hosp bed but has had to sleep in the recliner.  Daughter reports EF is very low so she suspected CHF exac as cause of her sxs since she has increased lower ext edema from baseline and more trouble breathing when she lays flat.   Decreased urination and stools but drinking ok.  Lasix was prev increased from 20 to 40mg  qd by Dr. Lowell Guitar, her nephrologist. Is not on K replacement.  Did get flu shot this yr. On coumadin has an aneursym in heart that was encased and bi-fem pop on right.   Past Medical History  Diagnosis Date  . Type II or unspecified type diabetes mellitus without mention of complication, not stated as uncontrolled   . PVD (peripheral vascular disease)   . Mixed hyperlipidemia   . Renal artery stenosis   . Compression fx, thoracic spine     T12  . Ischium fracture   . Memory loss, short term   . Pulmonary nodule   . CAD (coronary artery disease)   . CHF (congestive heart failure) 09-2008  . Anemia   . COPD (chronic obstructive pulmonary disease)   . Pneumonia   .  Detached retina 02-2005    R EYE  . Fracture 02-2007    L5 AND PELVIS  . COPD (chronic obstructive pulmonary disease) 03/18/2011   Current Outpatient Prescriptions on File Prior to Visit  Medication Sig Dispense Refill  . albuterol (PROVENTIL) (2.5 MG/3ML) 0.083% nebulizer solution Take 3 mLs (2.5 mg total) by nebulization every 6 (six) hours as needed.  225 mL  3  . albuterol-ipratropium (COMBIVENT) 18-103 MCG/ACT inhaler Inhale 2 puffs into the lungs every 4 (four) hours as needed. For wheezing      . budesonide-formoterol (SYMBICORT) 160-4.5 MCG/ACT inhaler Inhale 2 puffs into the lungs 2 (two) times daily.  3 Inhaler  3  . CALCIUM PO Take by mouth 2 (two) times daily.      . calcium-vitamin D (OSCAL WITH D) 500-200 MG-UNIT per tablet Take 1 tablet by mouth 2 (two) times daily.  180 tablet  3  . esomeprazole (NEXIUM) 40 MG capsule Take 1 capsule (40 mg total) by mouth daily before breakfast.  90 capsule  3  . ferrous sulfate 325 (65 FE) MG tablet Take 1 tablet (325 mg total) by mouth daily.  90 tablet  3  . ibandronate (BONIVA) 150 MG tablet 1  PO QAM 8 oz of water, on an empty stomach, take nothing else by mouth or lie down for 30 min once a month  3 tablet  3  . insulin glargine (LANTUS SOLOSTAR) 100 UNIT/ML injection Inject 20 Units into the skin at bedtime.  90 mL  3  . Insulin Pen Needle 31G X 5 MM MISC 20 Units by Does not apply route daily.  30 each  2  . Insulin Syringe-Needle U-100 30G X 1/2" 1 ML MISC 20 Units by Does not apply route daily.  100 each  3  . Ipratropium-Albuterol (COMBIVENT RESPIMAT) 20-100 MCG/ACT AERS respimat Inhale 1 puff into the lungs every 6 (six) hours.  3 Inhaler  3  . losartan (COZAAR) 100 MG tablet Take 1 tablet (100 mg total) by mouth daily.  90 tablet  3  . metoprolol succinate (TOPROL-XL) 25 MG 24 hr tablet Take 1 tablet (25 mg total) by mouth daily.  90 tablet  3  . MULTIPLE VITAMINS PO Take by mouth daily.      . Omega-3 Fatty Acids (FISH OIL) 1000 MG  CAPS Take 1 capsule (1,000 mg total) by mouth 2 (two) times daily.  180 capsule  3  . pyridOXINE (VITAMIN B-6) 100 MG tablet Take 1 tablet (100 mg total) by mouth daily.  90 tablet  3  . ranitidine (ZANTAC) 150 MG tablet Take 1 tablet (150 mg total) by mouth 2 (two) times daily.  180 tablet  11  . rosuvastatin (CRESTOR) 20 MG tablet Take 1 tablet (20 mg total) by mouth daily.  90 tablet  1  . warfarin (COUMADIN) 2.5 MG tablet Take 1 tablet (2.5 mg total) by mouth daily.  100 tablet  3  . Alcohol Swabs (WEBCOL ALCOHOL PREP MEDIUM) 70 % PADS by Does not apply route.      . potassium chloride SA (K-DUR,KLOR-CON) 20 MEQ tablet Take 1 tablet (20 mEq total) by mouth daily.  30 tablet  0     Review of Systems  Constitutional: Positive for activity change, appetite change and fatigue. Negative for fever, chills and diaphoresis.  HENT: Positive for nosebleeds, congestion and rhinorrhea. Negative for ear pain, trouble swallowing, neck pain, neck stiffness, voice change and ear discharge.   Respiratory: Positive for cough, shortness of breath and wheezing.   Cardiovascular: Positive for leg swelling. Negative for chest pain.  Gastrointestinal: Negative for nausea, vomiting, abdominal pain, diarrhea and constipation.  Genitourinary: Positive for decreased urine volume. Negative for dysuria and frequency.  Musculoskeletal: Positive for myalgias, arthralgias and gait problem. Negative for joint swelling.  Neurological: Positive for weakness and light-headedness. Negative for dizziness and syncope.  Hematological: Negative for adenopathy.  Psychiatric/Behavioral: Positive for sleep disturbance.      BP 122/73  Pulse 73  Temp 97.9 F (36.6 C) (Oral)  Resp 16  Ht 5\' 3"  (1.6 m)  Wt 157 lb (71.215 kg)  BMI 27.81 kg/m2  SpO2 93% on RA Objective:   Physical Exam  Constitutional: Vital signs are normal. She appears well-developed. She is cooperative. She does not appear ill. No distress.       In  wheelchair  HENT:  Head: Normocephalic and atraumatic.  Right Ear: Tympanic membrane, external ear and ear canal normal.  Left Ear: Tympanic membrane, external ear and ear canal normal.  Nose: Rhinorrhea present. No mucosal edema.  Mouth/Throat: Uvula is midline, oropharynx is clear and moist and mucous membranes are normal. No posterior oropharyngeal edema or posterior oropharyngeal erythema.  Cardiovascular: Normal rate, regular rhythm and S1 normal.   No murmur heard.      1+ lower ext edema to mi-d-calf  Pulmonary/Chest: Effort normal. No respiratory distress. She has decreased breath sounds. She has no wheezes. She has rhonchi in the right upper field, the right lower field and the left lower field. She has rales in the right lower field and the left lower field.       insp bibasilar rales and exp bibasilar rhonchi with insp and exp rhonchi in RUL  Neurological: She is alert.  Skin: Skin is warm and dry.  Psychiatric: She has a normal mood and affect. Her behavior is normal.     [facility] reading (PRIMARY) by  Dr. Clelia Croft. Bibasilar pulmonary effusion with increased medial right lobe consolidation. Osteoporosis and kyphosis.  Results for orders placed in visit on 02/24/12  POCT CBC      Component Value Range   WBC 10.8 (*) 4.6 - 10.2 K/uL   Lymph, poc 2.9  0.6 - 3.4   POC LYMPH PERCENT 26.9  10 - 50 %L   MID (cbc) 0.9  0 - 0.9   POC MID % 8.6  0 - 12 %M   POC Granulocyte 7.0 (*) 2 - 6.9   Granulocyte percent 64.5  37 - 80 %G   RBC 3.63 (*) 4.04 - 5.48 M/uL   Hemoglobin 10.6 (*) 12.2 - 16.2 g/dL   HCT, POC 16.1 (*) 09.6 - 47.9 %   MCV 96.4  80 - 97 fL   MCH, POC 29.2  27 - 31.2 pg   MCHC 30.3 (*) 31.8 - 35.4 g/dL   RDW, POC 04.5     Platelet Count, POC 277  142 - 424 K/uL   MPV 7.7  0 - 99.8 fL  POCT INFLUENZA A/B      Component Value Range   Influenza A, POC Negative     Influenza B, POC Negative      Assessment & Plan:  1. CHF exacerbation - history, exam, and cxr  consistent w/ this. Rec increase lasix from 40 qd to bid and start potassium supp of qd.  Check bnp - prev bnps range 500-700. 2. Long term use of anticoag - Pt is normally on Coumadin 2.5mg  qd except 1/2 tab (=1.25mg ) qMF. Check INR - will anticipate INR increase while on levaquin so skip coumadin dose today.  Will need to follow INR closely over the next 2 wks. 3. COPD exacerbation - concerns for complicating pulm infection w/ elev wbc so start prednisone 40mg  qd x 5d and levaquin 500 qd x 5d.  combivent neb given in office today. Cont home O2 2L - continuous. 4. Mult Med - check cmp 5. H/o anemia w/ nosebleeds and hemoptysis on coumadin - hgb decreased from baseline - recheck at f/u. Recheck in 48 hrs.  Call or to ER if any worsening.

## 2012-02-26 ENCOUNTER — Telehealth: Payer: Self-pay

## 2012-02-26 ENCOUNTER — Ambulatory Visit (INDEPENDENT_AMBULATORY_CARE_PROVIDER_SITE_OTHER): Payer: Medicare Other | Admitting: Emergency Medicine

## 2012-02-26 VITALS — BP 145/78 | HR 86 | Temp 98.0°F | Resp 16 | Wt 152.0 lb

## 2012-02-26 DIAGNOSIS — J449 Chronic obstructive pulmonary disease, unspecified: Secondary | ICD-10-CM

## 2012-02-26 DIAGNOSIS — N200 Calculus of kidney: Secondary | ICD-10-CM

## 2012-02-26 DIAGNOSIS — R739 Hyperglycemia, unspecified: Secondary | ICD-10-CM

## 2012-02-26 DIAGNOSIS — R7309 Other abnormal glucose: Secondary | ICD-10-CM

## 2012-02-26 DIAGNOSIS — K7689 Other specified diseases of liver: Secondary | ICD-10-CM

## 2012-02-26 DIAGNOSIS — R05 Cough: Secondary | ICD-10-CM

## 2012-02-26 DIAGNOSIS — N281 Cyst of kidney, acquired: Secondary | ICD-10-CM | POA: Insufficient documentation

## 2012-02-26 MED ORDER — IPRATROPIUM-ALBUTEROL 0.5-2.5 (3) MG/3ML IN SOLN: 3.0000 mL | Freq: Three times a day (TID) | RESPIRATORY_TRACT | Status: DC

## 2012-02-26 NOTE — Telephone Encounter (Signed)
MANDY FROM [facility] WOULD LIKE TO SPEAK WITH SOMEONE REGARDING PT. PLEASE CALL [phone]

## 2012-02-26 NOTE — Progress Notes (Signed)
  Subjective:    Patient ID: Suzanne [name], female    DOB: 06/30/1921, 79 y.o.   MRN: [id number]  HPI is still having trouble with her respiratory status. She has to sit up most of the time. She has been afebrile. Breathing has been somewhat improved. Has had some nosebleeds but her PT/INR was in range but her Coumadin has been on hold. She's not had fevers not coughing up any colored phlegm.    Review of Systems     Objective:   Physical Exam patient is seated in a wheelchair in no distress. She has a woody skin exam on the right leg. There is no appreciable edema. Her neck is supple. Her chest exam revealed good breath sounds without wheezes her cardiac exam is regular rate without murmurs        Assessment & Plan:  I told her to just stay on the potassium for another 3 days. She is then to decrease her Lasix extra dose to about every third day. It would be good if we could weigh her every day. I approached the idea about having her in assisted living but she was very resistant to this. We discussed his ultrasound report which did show a cyst in the liver and a cyst in the kidney. We will repeat an ultrasound about this in 6 months. He does not want to followup with the neck specialist at the present time to

## 2012-02-26 NOTE — Patient Instructions (Signed)
Increase her Lantus to 30 units a day while you're on your prednisone and Levaquin. In 3 days stop your extra Lasix and potassium. Continued use her nebulizer as instructed. Use saline spray in her nose and keep lubricant to the inside of her nose.

## 2012-02-27 NOTE — Telephone Encounter (Signed)
Patients inhalers are expensive she is requesting a long acting beta agonist bid, Angelica Chessman states this will help with her night time saturation. Duo Neb and Combivent are costly for her. I advised you saw patient yesterday and are pleased with how she was doing and you gave her refills on duo neb please advise.

## 2012-02-27 NOTE — Telephone Encounter (Signed)
Call [place] at [facility]. I saw the patient recently and she was okay with how things are going. The daughter was also okay with how things were going. I do not want to make any changes at the present time unless both her daughter and her want to make a change.

## 2012-02-27 NOTE — Progress Notes (Signed)
Reviewed and agree.

## 2012-03-01 NOTE — Telephone Encounter (Signed)
Explained Dr Ellis Parents message to [place] at [facility]. She agreed and will CB if pt/daughter contact her again asking for a change.

## 2012-03-02 ENCOUNTER — Encounter: Payer: Self-pay | Admitting: Emergency Medicine

## 2012-03-02 ENCOUNTER — Encounter: Payer: Self-pay | Admitting: Radiology

## 2012-03-02 ENCOUNTER — Ambulatory Visit (INDEPENDENT_AMBULATORY_CARE_PROVIDER_SITE_OTHER): Payer: Medicare Other | Admitting: Emergency Medicine

## 2012-03-02 VITALS — BP 112/62 | HR 75 | Temp 98.3°F | Resp 16 | Ht 62.0 in | Wt 153.8 lb

## 2012-03-02 DIAGNOSIS — I739 Peripheral vascular disease, unspecified: Secondary | ICD-10-CM

## 2012-03-02 DIAGNOSIS — E119 Type 2 diabetes mellitus without complications: Secondary | ICD-10-CM

## 2012-03-02 DIAGNOSIS — I509 Heart failure, unspecified: Secondary | ICD-10-CM

## 2012-03-02 NOTE — Progress Notes (Signed)
  Subjective:    Patient ID: Suzanne [name], female    DOB: 1921/11/09, 79 y.o.   MRN: [id number]  HPI patient enters for followup of peripheral edema and her coronary disease congestive heart failure and renal disease. She recently finished treatment with Levaquin and steroids for an episode of pneumonia. She is feeling much better. Her leg swelling has markedly decreased. Her renal function had declined on her last check.    Review of Systems     Objective:   Physical Exam patient looks good today she is alert and cooperative. Her neck is supple. Her chest exam today reveals fine rales in the very bases but good air exchange. Cardiac exam reveals a regular rate without murmurs. Extremity exam does trace edema on the right no edema on the left   Results for orders placed in visit on 03/02/12  GLUCOSE, POCT (MANUAL RESULT ENTRY)      Component Value Range   POC Glucose 214 (*) 70 - 99 mg/dl       Assessment & Plan:  Patient doing well post treatment for pneumonia. We'll check be met today to check the status of her kidney disease. Her sugar is high but hopefully it will give back to normal since she is no longer on Levaquin or prednisone

## 2012-03-03 ENCOUNTER — Encounter: Payer: Self-pay | Admitting: Family Medicine

## 2012-03-03 LAB — BASIC METABOLIC PANEL
BUN: 80 mg/dL — ABNORMAL HIGH (ref 6–23)
CO2: 28 mEq/L (ref 19–32)
Chloride: 91 mEq/L — ABNORMAL LOW (ref 96–112)
Glucose, Bld: 192 mg/dL — ABNORMAL HIGH (ref 70–99)
Potassium: 4.5 mEq/L (ref 3.5–5.3)
Sodium: 128 mEq/L — ABNORMAL LOW (ref 135–145)

## 2012-03-04 ENCOUNTER — Other Ambulatory Visit: Payer: Self-pay | Admitting: Emergency Medicine

## 2012-03-24 ENCOUNTER — Other Ambulatory Visit: Payer: Self-pay | Admitting: Radiology

## 2012-03-24 DIAGNOSIS — E119 Type 2 diabetes mellitus without complications: Secondary | ICD-10-CM

## 2012-03-24 MED ORDER — INSULIN PEN NEEDLE 31G X 5 MM MISC: 20.0000 [IU] | Freq: Every day | Status: DC

## 2012-03-30 ENCOUNTER — Encounter: Payer: Self-pay | Admitting: Emergency Medicine

## 2012-03-30 ENCOUNTER — Ambulatory Visit (INDEPENDENT_AMBULATORY_CARE_PROVIDER_SITE_OTHER): Payer: Medicare Other | Admitting: Emergency Medicine

## 2012-03-30 VITALS — BP 136/60 | HR 79 | Temp 98.0°F | Resp 16 | Ht 62.5 in | Wt 151.3 lb

## 2012-03-30 DIAGNOSIS — E119 Type 2 diabetes mellitus without complications: Secondary | ICD-10-CM

## 2012-03-30 NOTE — Progress Notes (Signed)
  Subjective:    Patient ID: Suzanne [name], female    DOB: August 04, 1921, 79 y.o.   MRN: [id number]  HPI patient in to recheck. She has severe COPD which is improved. His renal disease and is followed by the nephrologist for this. She is due to see her cardiologist Dr. Allyson Sabal next week. She developed congestive heart failure and needed higher doses of diuretics and when this was done her renal function declined. Her biggest complaint today is of cough. She continues to use her O2 at home    Review of Systems     Objective:   Physical Exam patient is alert today she is well groomed. Her neck is supple. Her chest exam reveals dry rales in both bases. Cardiac exam is regular rate without murmur. Abdomen is soft and nontender extremities revealed markedly decrease in her edema she has no pulses in either foot.  Results for orders placed in visit on 03/30/12  GLUCOSE, POCT (MANUAL RESULT ENTRY)      Result Value Range   POC Glucose 136 (*) 70 - 99 mg/dl        Assessment & Plan:  She has a chronic cough. She continues on several cord and was instructed to use her rinse after using this. She has renal disease with worsening secondary to required extra diuresis because of her recent congestive heart failure. She is to see the cardiologist next week and saw the nephrologist last week. Her sugar was great today. Her glucometers currently broken. She will continue on Lantus 20 units a day

## 2012-04-08 ENCOUNTER — Telehealth: Payer: Self-pay | Admitting: *Deleted

## 2012-04-08 NOTE — Telephone Encounter (Signed)
PT STATES SOMEONE HAD CALLED AND LEFT A MESSAGE ON HER MACHINE. PLEASE CALL [phone]

## 2012-04-08 NOTE — Telephone Encounter (Signed)
Called patient to ask if she uses this company, she states she would like the diabetic supplies to go to this company she states her meter is not working and she needs new one soon. She could not get meter at [facility] because they only have old type. Please sign form so we can get this for her. Form in my box on very top

## 2012-04-08 NOTE — Telephone Encounter (Signed)
lmom for pt to call back to make sure that she uses [employer] [employer] [facility].  If pt calls back please ask what she needs (test strips, lancets, etc....).  Sheet is at nurses station at phone messages.

## 2012-04-09 NOTE — Telephone Encounter (Signed)
Form signed and placed in outgoing fax pile

## 2012-04-10 ENCOUNTER — Encounter: Payer: Self-pay | Admitting: *Deleted

## 2012-04-15 ENCOUNTER — Other Ambulatory Visit: Payer: Self-pay | Admitting: Emergency Medicine

## 2012-04-28 ENCOUNTER — Telehealth: Payer: Self-pay

## 2012-04-28 NOTE — Telephone Encounter (Signed)
Patient called in stating she has been trying to get a meter through [employer] [facility] [employer] and they are telling her that they have faxed [place] a form several times.  She claims she spoke with someone last week and they told her it had been faxed to this company.  Please call her at [phone].

## 2012-04-29 MED ORDER — BLOOD GLUCOSE METER KIT: PACK | Status: DC

## 2012-04-29 MED ORDER — ONETOUCH ULTRASOFT LANCETS MISC: Status: DC

## 2012-04-29 MED ORDER — GLUCOSE BLOOD VI STRP: ORAL_STRIP | Status: DC

## 2012-04-29 NOTE — Telephone Encounter (Signed)
Printed for her please sign so I can fax.

## 2012-05-04 NOTE — Progress Notes (Signed)
Received req for DM supplies Rxs from [employer] in [place] and checked w/pt who stated she did not want supplies from them at this time. Faxed form back w/note advising this.

## 2012-05-12 ENCOUNTER — Encounter: Payer: Self-pay | Admitting: Pharmacist Clinician (PhC)/ Clinical Pharmacy Specialist

## 2012-05-12 DIAGNOSIS — I82409 Acute embolism and thrombosis of unspecified deep veins of unspecified lower extremity: Secondary | ICD-10-CM

## 2012-05-12 DIAGNOSIS — Z7901 Long term (current) use of anticoagulants: Secondary | ICD-10-CM | POA: Insufficient documentation

## 2012-05-13 ENCOUNTER — Ambulatory Visit: Payer: Medicare Other

## 2012-05-13 ENCOUNTER — Ambulatory Visit (INDEPENDENT_AMBULATORY_CARE_PROVIDER_SITE_OTHER): Payer: Medicare Other | Admitting: Emergency Medicine

## 2012-05-13 VITALS — BP 104/70 | HR 71 | Temp 97.6°F | Resp 16 | Ht 62.0 in | Wt 148.0 lb

## 2012-05-13 DIAGNOSIS — M79672 Pain in left foot: Secondary | ICD-10-CM

## 2012-05-13 DIAGNOSIS — L0291 Cutaneous abscess, unspecified: Secondary | ICD-10-CM

## 2012-05-13 DIAGNOSIS — I739 Peripheral vascular disease, unspecified: Secondary | ICD-10-CM

## 2012-05-13 DIAGNOSIS — E119 Type 2 diabetes mellitus without complications: Secondary | ICD-10-CM

## 2012-05-13 DIAGNOSIS — L0390 Cellulitis, unspecified: Secondary | ICD-10-CM

## 2012-05-13 DIAGNOSIS — M79609 Pain in unspecified limb: Secondary | ICD-10-CM

## 2012-05-13 MED ORDER — DOXYCYCLINE HYCLATE 100 MG PO CAPS: 100.0000 mg | ORAL_CAPSULE | Freq: Two times a day (BID) | ORAL | Status: DC

## 2012-05-13 NOTE — Progress Notes (Signed)
  Subjective:    Patient ID: Suzanne [name], female    DOB: 1921-03-07, 79 y.o.   MRN: [id number]  HPI the patient here with an open tender draining area of her left heel. She has diabetes and PAD. It is unclear exactly what they do start it but she showed it to her daughter yesterday. She is also on chronic Coumadin therapy. This is followed with Coumadin clinic.    Review of Systems     Objective:   Physical Exam the left heel is red and tender to touch. There is a 1 cm x 3 mm area of broken skin which is exquisitely sensitive to Q-tip. Culture was taken of this area. [facility] reading (PRIMARY) by  Dr. Cleta Alberts questionable old fracture healed of the second metatarsal. No evidence of osteomyelitis of the heel seen. Results for orders placed in visit on 05/13/12  GLUCOSE, POCT (MANUAL RESULT ENTRY)      Result Value Range   POC Glucose 141 (*) 70 - 99 mg/dl          Assessment & Plan:  She is to clean the area with soap and water twice a day followed by application of Bactroban. She will be on doxycycline 100 twice a day with food. She is to contact the Coumadin clinic regarding her taking these antibiotics. Referral made to the wound center for followup

## 2012-05-13 NOTE — Patient Instructions (Signed)
Cellulitis Cellulitis is an infection of the skin and the tissue beneath it. The infected area is usually red and tender. Cellulitis occurs most often in the arms and lower legs.   CAUSES   Cellulitis is caused by bacteria that enter the skin through cracks or cuts in the skin. The most common types of bacteria that cause cellulitis are Staphylococcus and Streptococcus. SYMPTOMS    Redness and warmth.   Swelling.   Tenderness or pain.   Fever.  DIAGNOSIS  Your caregiver can usually determine what is wrong based on a physical exam. Blood tests may also be done. TREATMENT   Treatment usually involves taking an antibiotic medicine. HOME CARE INSTRUCTIONS    Take your antibiotics as directed. Finish them even if you start to feel better.   Keep the infected arm or leg elevated to reduce swelling.   Apply a warm cloth to the affected area up to 4 times per day to relieve pain.   Only take over-the-counter or prescription medicines for pain, discomfort, or fever as directed by your caregiver.   Keep all follow-up appointments as directed by your caregiver.  SEEK MEDICAL CARE IF:    You notice red streaks coming from the infected area.   Your red area gets larger or turns dark in color.   Your bone or joint underneath the infected area becomes painful after the skin has healed.   Your infection returns in the same area or another area.   You notice a swollen bump in the infected area.   You develop new symptoms.  SEEK IMMEDIATE MEDICAL CARE IF:    You have a fever.   You feel very sleepy.   You develop vomiting or diarrhea.   You have a general ill feeling (malaise) with muscle aches and pains.  MAKE SURE YOU:    Understand these instructions.   Will watch your condition.   Will get help right away if you are not doing well or get worse.  Document Released: 11/13/2004 Document Revised: 08/05/2011 Document Reviewed: 04/21/2011 [employer] Patient Information 2013  [employer]    

## 2012-05-15 LAB — WOUND CULTURE: Gram Stain: NONE SEEN

## 2012-05-19 ENCOUNTER — Encounter (HOSPITAL_BASED_OUTPATIENT_CLINIC_OR_DEPARTMENT_OTHER): Payer: Medicare Other | Attending: General Surgery

## 2012-05-19 DIAGNOSIS — L89609 Pressure ulcer of unspecified heel, unspecified stage: Secondary | ICD-10-CM | POA: Insufficient documentation

## 2012-05-19 DIAGNOSIS — L8992 Pressure ulcer of unspecified site, stage 2: Secondary | ICD-10-CM | POA: Insufficient documentation

## 2012-05-28 ENCOUNTER — Other Ambulatory Visit (HOSPITAL_COMMUNITY): Payer: Self-pay | Admitting: Cardiovascular Disease

## 2012-05-28 DIAGNOSIS — I739 Peripheral vascular disease, unspecified: Secondary | ICD-10-CM

## 2012-06-08 ENCOUNTER — Encounter: Payer: Self-pay | Admitting: Emergency Medicine

## 2012-06-08 ENCOUNTER — Ambulatory Visit: Payer: Medicare Other | Admitting: Emergency Medicine

## 2012-06-08 VITALS — BP 130/62 | HR 82 | Temp 97.5°F | Resp 18 | Ht 62.0 in | Wt 146.0 lb

## 2012-06-08 DIAGNOSIS — G629 Polyneuropathy, unspecified: Secondary | ICD-10-CM

## 2012-06-08 DIAGNOSIS — E119 Type 2 diabetes mellitus without complications: Secondary | ICD-10-CM

## 2012-06-08 MED ORDER — GABAPENTIN 100 MG PO CAPS: ORAL_CAPSULE | ORAL | Status: DC

## 2012-06-08 NOTE — Progress Notes (Signed)
  Subjective:    Patient ID: Suzanne [name], female    DOB: 05-Jan-1922, 79 y.o.   MRN: [id number]  HPI 79 yo female with an extensive medical history seen today, accompanied by daughter, for follow-up. Is undergoing treatment for a wound on her left heal. Is currently wearing a protective boot and goes to wound center once per week. Has appointment tomorrow at wound clinic. Has great pain in that foot that wakes her up at night. Still having trouble with breathing. O2 was 93% today. Taking symbicort twice daily. Using the combivent every 6 hours. Using albuterol nebulizer twice daily. On 2L O2 at night, and occasionally during the day. Does not like to wear it while she is walking around due to fear of falling. Only gets short of breath when she is walking around being active.  Reports that sugars have been generally good. Has had 2 instances in the 250's in the past 3 weeks, otherwise in the 90's and as low at 75. Daughter does regular foot exams.  Has PVD and neuropathy, so feet stay swollen and red.  Has appointment with nephrologist 5/19. Has appointment for coumadin in 3 days.    Review of Systems  Constitutional: Negative for fever and chills.  Respiratory: Positive for cough, shortness of breath and wheezing.   Cardiovascular: Positive for leg swelling. Negative for chest pain and palpitations.  Gastrointestinal: Negative for diarrhea and constipation.       Objective:   Physical Exam  Constitutional: She is oriented to person, place, and time. She appears well-developed and well-nourished.  HENT:  Head: Normocephalic and atraumatic.  Cardiovascular: Normal rate, regular rhythm and normal heart sounds.   Pulmonary/Chest: Effort normal. She has wheezes.  Diffuse wheezes bilaterally  Neurological: She is alert and oriented to person, place, and time.  R foot exam reveals pitting edema in foot.  No ulcers or cracks noted. Left foot in boot, toes exposed. Monofilament exam shows  normal sensation.   Results for orders placed in visit on 06/08/12  GLUCOSE, POCT (MANUAL RESULT ENTRY)      Result Value Range   POC Glucose 199 (*) 70 - 99 mg/dl  POCT GLYCOSYLATED HEMOGLOBIN (HGB A1C)      Result Value Range   Hemoglobin A1C 6.9          Assessment & Plan:  Continue treatment at the wound center. No changes in your diabetes treatment at present. She is due to see the nephrologist in a month.

## 2012-06-11 ENCOUNTER — Ambulatory Visit (HOSPITAL_COMMUNITY)
Admission: RE | Admit: 2012-06-11 | Discharge: 2012-06-11 | Disposition: A | Discharge status: Home / Self Care | Payer: Medicare Other | Source: Ambulatory Visit | Attending: Cardiovascular Disease | Admitting: Cardiovascular Disease

## 2012-06-11 DIAGNOSIS — I739 Peripheral vascular disease, unspecified: Secondary | ICD-10-CM | POA: Insufficient documentation

## 2012-06-11 NOTE — Progress Notes (Signed)
Arterial Duplex Lower Ext. Completed. Jatinder Mcdonagh D  

## 2012-06-17 ENCOUNTER — Telehealth: Payer: Self-pay

## 2012-06-17 NOTE — Telephone Encounter (Signed)
PT STATES SHE HAD SEEN THE DR THE OTHER DAY AND ISN'T GETTING ANY BETTER. PLEASE CALL [phone]    [facility] [address]

## 2012-06-17 NOTE — Telephone Encounter (Signed)
Called her, she states wound is okay. She is referring to the Gabapentin. She states she gets very sleepy with this medication. I advised her to take only one at night, she does not want to take them at all, wants to stop meds.

## 2012-06-17 NOTE — Telephone Encounter (Signed)
Patient states the gabapentin makes her feel bad when she takes it. We just going to stop the medication

## 2012-07-05 ENCOUNTER — Other Ambulatory Visit: Payer: Self-pay

## 2012-07-05 DIAGNOSIS — Z1231 Encounter for screening mammogram for malignant neoplasm of breast: Secondary | ICD-10-CM

## 2012-07-16 ENCOUNTER — Other Ambulatory Visit: Payer: Self-pay | Admitting: Physician Assistant

## 2012-07-22 ENCOUNTER — Other Ambulatory Visit: Payer: Self-pay | Admitting: Cardiovascular Disease

## 2012-07-22 ENCOUNTER — Encounter: Payer: Self-pay | Admitting: Cardiovascular Disease

## 2012-07-22 LAB — PROTIME-INR
INR: 1.73 — ABNORMAL HIGH (ref <1.50)
Prothrombin Time: 19.8 seconds — ABNORMAL HIGH (ref 11.6–15.2)

## 2012-07-29 ENCOUNTER — Other Ambulatory Visit: Payer: Self-pay | Admitting: Pharmacist Clinician (PhC)/ Clinical Pharmacy Specialist

## 2012-07-29 ENCOUNTER — Ambulatory Visit (INDEPENDENT_AMBULATORY_CARE_PROVIDER_SITE_OTHER): Payer: Self-pay | Admitting: Pharmacist Clinician (PhC)/ Clinical Pharmacy Specialist

## 2012-07-29 DIAGNOSIS — I82409 Acute embolism and thrombosis of unspecified deep veins of unspecified lower extremity: Secondary | ICD-10-CM

## 2012-07-29 DIAGNOSIS — Z7901 Long term (current) use of anticoagulants: Secondary | ICD-10-CM

## 2012-07-29 MED ORDER — WARFARIN SODIUM 2.5 MG PO TABS: ORAL_TABLET | ORAL | Status: DC

## 2012-08-05 ENCOUNTER — Other Ambulatory Visit: Payer: Self-pay | Admitting: Pharmacist Clinician (PhC)/ Clinical Pharmacy Specialist

## 2012-08-05 MED ORDER — WARFARIN SODIUM 2.5 MG PO TABS: ORAL_TABLET | ORAL | Status: DC

## 2012-08-09 ENCOUNTER — Other Ambulatory Visit: Payer: Self-pay | Admitting: Cardiovascular Disease

## 2012-08-09 ENCOUNTER — Ambulatory Visit
Admission: RE | Admit: 2012-08-09 | Discharge: 2012-08-09 | Disposition: A | Discharge status: Home / Self Care | Payer: Medicare Other | Source: Ambulatory Visit

## 2012-08-09 ENCOUNTER — Ambulatory Visit: Payer: Medicare Other

## 2012-08-09 DIAGNOSIS — Z1231 Encounter for screening mammogram for malignant neoplasm of breast: Secondary | ICD-10-CM

## 2012-08-09 LAB — PROTIME-INR
INR: 1.85 — ABNORMAL HIGH (ref <1.50)
Prothrombin Time: 20.8 seconds — ABNORMAL HIGH (ref 11.6–15.2)

## 2012-08-11 ENCOUNTER — Ambulatory Visit (INDEPENDENT_AMBULATORY_CARE_PROVIDER_SITE_OTHER): Payer: Self-pay | Admitting: Pharmacist Clinician (PhC)/ Clinical Pharmacy Specialist

## 2012-08-11 DIAGNOSIS — I82409 Acute embolism and thrombosis of unspecified deep veins of unspecified lower extremity: Secondary | ICD-10-CM

## 2012-08-11 DIAGNOSIS — Z7901 Long term (current) use of anticoagulants: Secondary | ICD-10-CM

## 2012-08-24 ENCOUNTER — Other Ambulatory Visit: Payer: Self-pay | Admitting: Cardiovascular Disease

## 2012-08-24 LAB — PROTIME-INR
INR: 2.29 — ABNORMAL HIGH (ref <1.50)
Prothrombin Time: 24.4 seconds — ABNORMAL HIGH (ref 11.6–15.2)

## 2012-08-27 ENCOUNTER — Encounter (HOSPITAL_BASED_OUTPATIENT_CLINIC_OR_DEPARTMENT_OTHER): Payer: Medicare Other | Attending: General Surgery

## 2012-08-27 ENCOUNTER — Telehealth: Payer: Self-pay | Admitting: Cardiovascular Disease

## 2012-08-27 DIAGNOSIS — Z794 Long term (current) use of insulin: Secondary | ICD-10-CM | POA: Insufficient documentation

## 2012-08-27 DIAGNOSIS — J449 Chronic obstructive pulmonary disease, unspecified: Secondary | ICD-10-CM | POA: Insufficient documentation

## 2012-08-27 DIAGNOSIS — E119 Type 2 diabetes mellitus without complications: Secondary | ICD-10-CM | POA: Insufficient documentation

## 2012-08-27 DIAGNOSIS — J4489 Other specified chronic obstructive pulmonary disease: Secondary | ICD-10-CM | POA: Insufficient documentation

## 2012-08-27 DIAGNOSIS — L97509 Non-pressure chronic ulcer of other part of unspecified foot with unspecified severity: Secondary | ICD-10-CM | POA: Insufficient documentation

## 2012-08-27 DIAGNOSIS — Z7901 Long term (current) use of anticoagulants: Secondary | ICD-10-CM | POA: Insufficient documentation

## 2012-08-27 DIAGNOSIS — I10 Essential (primary) hypertension: Secondary | ICD-10-CM | POA: Insufficient documentation

## 2012-08-27 DIAGNOSIS — Z79899 Other long term (current) drug therapy: Secondary | ICD-10-CM | POA: Insufficient documentation

## 2012-08-27 NOTE — Telephone Encounter (Signed)
Suzanne [name] is calling to find out the dosage amount of coumadin from pt that was taken on 08/17/12   Thanks

## 2012-08-28 NOTE — Progress Notes (Signed)
[facility]  NAME:  [name], [name] NO.:  [id number]  MEDICAL RECORD NO.:  [id number]      DATE OF BIRTH:  1922-01-22  PHYSICIAN:  Ardath Sax, M.D.           VISIT DATE:                                  OFFICE VISIT   Suzanne [name] is an 79 year old diabetic lady, who comes to [place] with recurrent probable diabetic foot ulcer, Wagner 2 on her left heel.  This lady today had a temperature of 98.6, pulse 75, respirations 16, blood pressure 150/80.  She weighs 145 pounds and is 5 feet 2 inches.  She has had extensive surgery on her lower extremity blood vessels.  She has had fem-fem, fem pops, and femoral perineal.  She is also on Coumadin and is followed by the Coumadin Clinic.  The ulcer on her left heel is very tender, but really is very small.  It is only about 3 mm in length.  It may be 1.5 in depth.  Her foot is warm.  I do not feel any pulses, but she has got good capillary filling.  I think her blood supply is adequate.  She also has COPD and she is on inhalers at home.  Other medicines she is on Lantus, insulin, Cozaar for hypertension, metoprolol for hypertension, 2.5 mg of warfarin a day, 40 mg of Lasix, and then on albuterol inhalers and she takes iron, calcium, fish oil, and ranitidine.  So, I am going to treat her with silver alginate and a heel protector, and we will see her back here in a week.     Ardath Sax, M.D.     PP/MEDQ  D:  08/27/2012  T:  08/28/2012  Job:  [id number]

## 2012-08-30 ENCOUNTER — Ambulatory Visit (INDEPENDENT_AMBULATORY_CARE_PROVIDER_SITE_OTHER): Payer: Self-pay | Admitting: Pharmacist Clinician (PhC)/ Clinical Pharmacy Specialist

## 2012-08-30 DIAGNOSIS — Z7901 Long term (current) use of anticoagulants: Secondary | ICD-10-CM

## 2012-08-30 DIAGNOSIS — I82409 Acute embolism and thrombosis of unspecified deep veins of unspecified lower extremity: Secondary | ICD-10-CM

## 2012-08-30 NOTE — Telephone Encounter (Signed)
LMOM for dtr - pt dose is 2.5mg  qd x 1.25mg  M, is due for INR check today Monday July 14.

## 2012-08-31 ENCOUNTER — Encounter: Payer: Self-pay | Admitting: Emergency Medicine

## 2012-08-31 ENCOUNTER — Ambulatory Visit (INDEPENDENT_AMBULATORY_CARE_PROVIDER_SITE_OTHER): Payer: Medicare Other | Admitting: Emergency Medicine

## 2012-08-31 VITALS — BP 162/78 | HR 84 | Temp 98.3°F | Resp 18 | Ht 62.5 in | Wt 145.0 lb

## 2012-08-31 DIAGNOSIS — M810 Age-related osteoporosis without current pathological fracture: Secondary | ICD-10-CM

## 2012-08-31 DIAGNOSIS — R5383 Other fatigue: Secondary | ICD-10-CM

## 2012-08-31 DIAGNOSIS — E119 Type 2 diabetes mellitus without complications: Secondary | ICD-10-CM

## 2012-08-31 DIAGNOSIS — D649 Anemia, unspecified: Secondary | ICD-10-CM

## 2012-08-31 DIAGNOSIS — R0602 Shortness of breath: Secondary | ICD-10-CM

## 2012-08-31 DIAGNOSIS — N186 End stage renal disease: Secondary | ICD-10-CM

## 2012-08-31 DIAGNOSIS — I10 Essential (primary) hypertension: Secondary | ICD-10-CM

## 2012-08-31 DIAGNOSIS — I5042 Chronic combined systolic (congestive) and diastolic (congestive) heart failure: Secondary | ICD-10-CM

## 2012-08-31 DIAGNOSIS — R5381 Other malaise: Secondary | ICD-10-CM

## 2012-08-31 DIAGNOSIS — J449 Chronic obstructive pulmonary disease, unspecified: Secondary | ICD-10-CM

## 2012-08-31 LAB — GLUCOSE, POCT (MANUAL RESULT ENTRY): POC Glucose: 96 mg/dl (ref 70–99)

## 2012-08-31 MED ORDER — METOPROLOL SUCCINATE ER 25 MG PO TB24: 25.0000 mg | ORAL_TABLET | Freq: Every day | ORAL | Status: DC

## 2012-08-31 MED ORDER — LIDOCAINE 5 % EX PTCH: 1.0000 | MEDICATED_PATCH | CUTANEOUS | Status: DC

## 2012-08-31 MED ORDER — BUDESONIDE-FORMOTEROL FUMARATE 160-4.5 MCG/ACT IN AERO: 2.0000 | INHALATION_SPRAY | Freq: Two times a day (BID) | RESPIRATORY_TRACT | Status: DC

## 2012-08-31 MED ORDER — FERROUS SULFATE 325 (65 FE) MG PO TABS: 325.0000 mg | ORAL_TABLET | Freq: Every day | ORAL | Status: DC

## 2012-08-31 MED ORDER — WARFARIN SODIUM 2.5 MG PO TABS: ORAL_TABLET | ORAL | Status: DC

## 2012-08-31 MED ORDER — INSULIN PEN NEEDLE 31G X 5 MM MISC: 20.0000 [IU] | Freq: Every day | Status: DC

## 2012-08-31 MED ORDER — BLOOD GLUCOSE METER KIT: PACK | Status: DC

## 2012-08-31 MED ORDER — IBANDRONATE SODIUM 150 MG PO TABS: ORAL_TABLET | ORAL | Status: DC

## 2012-08-31 MED ORDER — ESOMEPRAZOLE MAGNESIUM 40 MG PO CPDR: 40.0000 mg | DELAYED_RELEASE_CAPSULE | Freq: Every day | ORAL | Status: DC

## 2012-08-31 MED ORDER — RANITIDINE HCL 150 MG PO TABS: 150.0000 mg | ORAL_TABLET | Freq: Two times a day (BID) | ORAL | Status: DC

## 2012-08-31 MED ORDER — FUROSEMIDE 40 MG PO TABS: 40.0000 mg | ORAL_TABLET | Freq: Every day | ORAL | Status: DC

## 2012-08-31 MED ORDER — WEBCOL ALCOHOL PREP MEDIUM 70 % PADS: 1.0000 "application " | MEDICATED_PAD | Status: DC

## 2012-08-31 MED ORDER — LOSARTAN POTASSIUM 100 MG PO TABS: 100.0000 mg | ORAL_TABLET | Freq: Every day | ORAL | Status: DC

## 2012-08-31 NOTE — Progress Notes (Signed)
  Subjective:    Patient ID: Suzanne [name], female    DOB: 16-May-1921, 79 y.o.   MRN: [id number]  HPI patient with multiple issues today. Regarding her diabetes she has had a few low spells. He currently is on 20 units of Lantus a day problem #2 COPD. She has had worsening recently. We reviewed her treatment protocol. She is currently doing 2 nebs a day and using her Combivent in between treatments. She wears her oxygen continuously during the night does not like to use it when she does things during the day. Problem #3 is a nonhealing wound on her left ankle. She is going to the wound center for this problem problem #4 coronary disease. She saw Dr. Allyson Sabal about 2 months ago things were stable at that time problem #5 renal disease this was stable at our last visit to see Dr. Lowell Guitar. He plans to see her in 6 months    Review of Systems     Objective:   Physical Exam patient is alert and cooperative. Her chest exam revealed bilateral wheezes with some rales in the bases. Her cardiac exam was a regular rate and rhythm. Her abdomen was soft nontender extremity exam reveals the wound present on the back of her left heel she has significantly decreased sensation to her feet with very poor circulation        Assessment & Plan:  Her meds were refilled today in that she gets them 4 prior. Her pressure was up today . She currently has been taking Lasix 40 a day and I am interested in what her renal function is today apparently her Coumadin has been not at goal and her dosage has been adjusted on this she continues at the wound Center for treatment of her nonhealing ulcer of the left heel. I tried to do a chest x-ray today but the machine was broken so this was not done. Her last chest x-ray earlier in the year showed evidence of congestive failure with bilateral small pleural effusions. I did add a BNP to her blood work to see what the status of her heart failure is. I encouraged her to use her O2  continuously during the I decreased her Lantus to 16 units a day because I am concerned living alone she would have a low spell.

## 2012-09-01 LAB — CBC WITH DIFFERENTIAL/PLATELET
Basophils Absolute: 0.1 10*3/uL (ref 0.0–0.1)
Basophils Relative: 1 % (ref 0–1)
Eosinophils Absolute: 0.2 10*3/uL (ref 0.0–0.7)
Hemoglobin: 10.4 g/dL — ABNORMAL LOW (ref 12.0–15.0)
MCH: 29.5 pg (ref 26.0–34.0)
MCHC: 33 g/dL (ref 30.0–36.0)
Monocytes Relative: 10 % (ref 3–12)
Neutro Abs: 4 10*3/uL (ref 1.7–7.7)
Neutrophils Relative %: 58 % (ref 43–77)
RDW: 13.6 % (ref 11.5–15.5)

## 2012-09-01 LAB — BASIC METABOLIC PANEL
BUN: 47 mg/dL — ABNORMAL HIGH (ref 6–23)
Calcium: 9.9 mg/dL (ref 8.4–10.5)
Glucose, Bld: 91 mg/dL (ref 70–99)
Potassium: 5.1 mEq/L (ref 3.5–5.3)
Sodium: 145 mEq/L (ref 135–145)

## 2012-09-02 LAB — BRAIN NATRIURETIC PEPTIDE: Brain Natriuretic Peptide: 146.9 pg/mL — ABNORMAL HIGH (ref 0.0–100.0)

## 2012-09-14 ENCOUNTER — Ambulatory Visit: Payer: Medicare Other | Admitting: Emergency Medicine

## 2012-09-26 ENCOUNTER — Other Ambulatory Visit: Payer: Self-pay | Admitting: Emergency Medicine

## 2012-09-29 ENCOUNTER — Other Ambulatory Visit: Payer: Self-pay | Admitting: Cardiovascular Disease

## 2012-09-30 LAB — PROTIME-INR: INR: 2.02 — ABNORMAL HIGH (ref <1.50)

## 2012-10-04 ENCOUNTER — Ambulatory Visit (INDEPENDENT_AMBULATORY_CARE_PROVIDER_SITE_OTHER): Payer: Self-pay | Admitting: Pharmacist Clinician (PhC)/ Clinical Pharmacy Specialist

## 2012-10-04 DIAGNOSIS — I82409 Acute embolism and thrombosis of unspecified deep veins of unspecified lower extremity: Secondary | ICD-10-CM

## 2012-10-04 DIAGNOSIS — Z7901 Long term (current) use of anticoagulants: Secondary | ICD-10-CM

## 2012-10-22 ENCOUNTER — Other Ambulatory Visit: Payer: Self-pay | Admitting: Physician Assistant

## 2012-10-26 ENCOUNTER — Other Ambulatory Visit: Payer: Self-pay | Admitting: Cardiovascular Disease

## 2012-10-28 ENCOUNTER — Ambulatory Visit (INDEPENDENT_AMBULATORY_CARE_PROVIDER_SITE_OTHER): Payer: TRICARE For Life (TFL) | Admitting: Pharmacist Clinician (PhC)/ Clinical Pharmacy Specialist

## 2012-10-28 DIAGNOSIS — I82409 Acute embolism and thrombosis of unspecified deep veins of unspecified lower extremity: Secondary | ICD-10-CM

## 2012-10-28 DIAGNOSIS — Z7901 Long term (current) use of anticoagulants: Secondary | ICD-10-CM

## 2012-11-11 ENCOUNTER — Other Ambulatory Visit: Payer: Self-pay | Admitting: Cardiovascular Disease

## 2012-11-11 LAB — PROTIME-INR: INR: 2.51 — ABNORMAL HIGH (ref <1.50)

## 2012-11-12 ENCOUNTER — Encounter: Payer: Self-pay | Admitting: Cardiovascular Disease

## 2012-11-12 ENCOUNTER — Ambulatory Visit (INDEPENDENT_AMBULATORY_CARE_PROVIDER_SITE_OTHER): Payer: Medicare Other | Admitting: Pharmacist Clinician (PhC)/ Clinical Pharmacy Specialist

## 2012-11-12 ENCOUNTER — Ambulatory Visit (INDEPENDENT_AMBULATORY_CARE_PROVIDER_SITE_OTHER): Payer: Medicare Other | Admitting: Cardiovascular Disease

## 2012-11-12 VITALS — BP 140/64 | Ht 62.5 in | Wt 146.0 lb

## 2012-11-12 DIAGNOSIS — I2510 Atherosclerotic heart disease of native coronary artery without angina pectoris: Secondary | ICD-10-CM

## 2012-11-12 DIAGNOSIS — Z7901 Long term (current) use of anticoagulants: Secondary | ICD-10-CM

## 2012-11-12 DIAGNOSIS — E782 Mixed hyperlipidemia: Secondary | ICD-10-CM

## 2012-11-12 DIAGNOSIS — I82409 Acute embolism and thrombosis of unspecified deep veins of unspecified lower extremity: Secondary | ICD-10-CM

## 2012-11-12 DIAGNOSIS — J449 Chronic obstructive pulmonary disease, unspecified: Secondary | ICD-10-CM

## 2012-11-12 DIAGNOSIS — I739 Peripheral vascular disease, unspecified: Secondary | ICD-10-CM

## 2012-11-12 NOTE — Progress Notes (Signed)
11/12/2012 Suzanne [name]   1921/12/13  [id number]  Primary Physician DAUB, Stan Head, MD Primary Cardiologist: Runell Gess MD [name]   HPI:  The patient is an 79 year old, thin and frail-appearing, widowed Caucasian female, mother of 1 daughter Suzanne [name]) who accompanies her today. I last saw her approximately 8 months ago. She has a history of mild CAD by cath which I performed October 12, 2008. Her EF at that time was 35% to 40% and by echo her EF is normal done February 17, 2011, with diastolic dysfunction. She has had episodes of CHF exacerbation. Her other problems include COPD on home oxygen, hypertension and hyperlipidemia and peripheral vascular occlusive disease. She has had aortobifemoral bypass grafting remotely as well as fem-peroneal bypass grafting. I last angiogram'd her February 02, 1998, revealing patient aortobifemoral and a patent right fem-peroneal bypass graft with a high-grade lesion beyond the graft insertion. She had 1-vessel runoff on the right, 2 on the left with high-grade distal left SFA disease on the left and bilateral renal artery stenosis. She denies chest pain but is chronically short of breath. She has developed an open wound on her left heel and is going to the wound care center. Her last Doppler of her lower extremities performed a year ago revealed a right ABI of 0.75 with a patent fem-peroneal bypass graft, an occluded right SFA with 1-vessel runoff and high-grade left common femoral and SFA disease. She also has moderate renal insufficiency followed by Dr. Casimiro Needle. Her major complaints are shortness of breath. She is on home O2. She is scheduled to see Dr. Carlena Sax in the office next week for continued evaluation. She denies chest pain.      Current Outpatient Prescriptions  Medication Sig Dispense Refill  . albuterol (PROVENTIL) (2.5 MG/3ML) 0.083% nebulizer solution Take 3 mLs (2.5 mg total) by nebulization every 6 (six) hours as needed.   225 mL  3  . albuterol-ipratropium (COMBIVENT) 18-103 MCG/ACT inhaler Inhale 2 puffs into the lungs every 4 (four) hours as needed. For wheezing      . Alcohol Swabs (WEBCOL ALCOHOL PREP MEDIUM) 70 % PADS 1 application by Does not apply route 1 day or 1 dose.  100 each  3  . Blood Glucose Monitoring Suppl (BLOOD GLUCOSE METER) kit Use as instructed  1 each  0  . budesonide-formoterol (SYMBICORT) 160-4.5 MCG/ACT inhaler Inhale 2 puffs into the lungs 2 (two) times daily.  3 Inhaler  3  . calcium-vitamin D (OSCAL WITH D) 500-200 MG-UNIT per tablet Take 1 tablet by mouth 2 (two) times daily.  180 tablet  3  . CRESTOR 20 MG tablet TAKE 1 TABLET DAILY  90 tablet  1  . esomeprazole (NEXIUM) 40 MG capsule Take 1 capsule (40 mg total) by mouth daily before breakfast.  90 capsule  3  . ferrous sulfate 325 (65 FE) MG tablet Take 1 tablet (325 mg total) by mouth daily.  90 tablet  3  . furosemide (LASIX) 40 MG tablet Take 1 tablet (40 mg total) by mouth daily.  90 tablet  0  . glucose blood test strip Use as instructed  100 each  12  . ibandronate (BONIVA) 150 MG tablet 1 PO QAM 8 oz of water, on an empty stomach, take nothing else by mouth or lie down for 30 min once a month  3 tablet  3  . Insulin Glargine (LANTUS SOLOSTAR) 100 UNIT/ML SOPN Inject 16 units daily. Dx code 250.00.  15  mL  1  . Insulin Pen Needle 31G X 5 MM MISC 20 Units by Does not apply route daily.  100 each  3  . Insulin Syringe-Needle U-100 30G X 1/2" 1 ML MISC 20 Units by Does not apply route daily.  100 each  3  . Ipratropium-Albuterol (COMBIVENT RESPIMAT) 20-100 MCG/ACT AERS respimat Inhale 1 puff into the lungs every 6 (six) hours.  3 Inhaler  3  . ipratropium-albuterol (DUONEB) 0.5-2.5 (3) MG/3ML SOLN Take 3 mLs by nebulization 3 (three) times daily.  360 mL  12  . Lancets (ONETOUCH ULTRASOFT) lancets Insulin dependant diabetes patient checks blood sugars tid  300 each  4  . lidocaine (LIDODERM) 5 % Place 1 patch onto the skin daily.  Remove & Discard patch within 12 hours or as directed by MD  90 patch  3  . losartan (COZAAR) 100 MG tablet Take 1 tablet (100 mg total) by mouth daily.  90 tablet  3  . metoprolol succinate (TOPROL-XL) 25 MG 24 hr tablet Take 1 tablet (25 mg total) by mouth daily.  90 tablet  3  . Omega-3 Fatty Acids (FISH OIL) 1000 MG CAPS Take 1 capsule (1,000 mg total) by mouth 2 (two) times daily.  180 capsule  3  . ranitidine (ZANTAC) 150 MG tablet Take 1 tablet (150 mg total) by mouth 2 (two) times daily.  180 tablet  11  . warfarin (COUMADIN) 2.5 MG tablet Take 1 tablet by mouth daily or as directed by coumadin clinic  90 tablet  3   No current facility-administered medications for this visit.    Allergies  Allergen Reactions  . Codeine Other (See Comments)    hallucinations  . Hydrochlorothiazide Other (See Comments)    hypotension  . Other     Adhesives and bandages gives pt a rash.  . Pregabalin Other (See Comments)    unknown    History   Social History  . Marital Status: Widowed    Spouse Name: N/A    Number of Children: N/A  . Years of Education: N/A   Occupational History  . Not on file.   Social History Main Topics  . Smoking status: Former Smoker    Types: Cigarettes    Quit date: 02/16/1997  . Smokeless tobacco: Never Used  . Alcohol Use: No  . Drug Use: No  . Sexual Activity: No   Other Topics Concern  . Not on file   Social History Narrative   Lives by self, cares for self, drives.       Review of Systems: General: negative for chills, fever, night sweats or weight changes.  Cardiovascular: negative for chest pain, dyspnea on exertion, edema, orthopnea, palpitations, paroxysmal nocturnal dyspnea or shortness of breath Dermatological: negative for rash Respiratory: negative for cough or wheezing Urologic: negative for hematuria Abdominal: negative for nausea, vomiting, diarrhea, bright red blood per rectum, melena, or hematemesis Neurologic: negative for  visual changes, syncope, or dizziness All other systems reviewed and are otherwise negative except as noted above.    Blood pressure 140/64, height 5' 2.5" (1.588 m), weight 146 lb (66.225 kg).  General appearance: alert and no distress Neck: no adenopathy, no JVD, supple, symmetrical, trachea midline, thyroid not enlarged, symmetric, no tenderness/mass/nodules and loud right carotid bruit Lungs: clear to auscultation bilaterally Heart: regular rate and rhythm, S1, S2 normal, no murmur, click, rub or gallop Extremities: extremities normal, atraumatic, no cyanosis or edema  EKG normal sinus rhythm at 88 with  nonspecific ST and T-wave changes  ASSESSMENT AND PLAN:   CAD (coronary artery disease) Mild to noncritical CAD by cardiac catheterization 10/12/08 with scattered 30-50% blockages and an ejection fraction of 35-40%. Subsequent 2-D echo 02/17/11 revealed a normal EF.  COPD (chronic obstructive pulmonary disease) The patient is on home O2. She complains of chronic dyspnea.  PAD (peripheral artery disease) Status post remote aortobifemoral bypass grafting. She's had a right thumb. The bypass graft as well remotely. I left him to bend her in 1999 revealing bilateral renal artery stenosis, patent aortobifemoral bypass graft, patent right femoral peroneal bypass graft with severe tibial vessel disease. Her most recent Dopplers performed 06/11/12 wheeled patent aortobifemoral bypass grafting, right ABI 0.71 the left of 0.56. She did have a half ago she signal in her left SFA.  Mixed hyperlipidemia On statin therapy with lipid profile echo for secondary prevention      Runell Gess MD [facility], [facility] 11/12/2012 1:05 PM

## 2012-11-12 NOTE — Assessment & Plan Note (Signed)
On statin therapy with lipid profile echo for secondary prevention

## 2012-11-12 NOTE — Patient Instructions (Addendum)
Dr Berry wants you to follow-up in 6 months. You will receive a reminder letter in the mail two months in advance. If you don't receive a letter, please call our office to schedule the follow-up appointment. 

## 2012-11-12 NOTE — Assessment & Plan Note (Signed)
The patient is on home O2. She complains of chronic dyspnea.

## 2012-11-12 NOTE — Assessment & Plan Note (Signed)
Mild to noncritical CAD by cardiac catheterization 10/12/08 with scattered 30-50% blockages and an ejection fraction of 35-40%. Subsequent 2-D echo 02/17/11 revealed a normal EF.

## 2012-11-12 NOTE — Assessment & Plan Note (Signed)
Status post remote aortobifemoral bypass grafting. She's had a right thumb. The bypass graft as well remotely. I left him to bend her in 1999 revealing bilateral renal artery stenosis, patent aortobifemoral bypass graft, patent right femoral peroneal bypass graft with severe tibial vessel disease. Her most recent Dopplers performed 06/11/12 wheeled patent aortobifemoral bypass grafting, right ABI 0.71 the left of 0.56. She did have a half ago she signal in her left SFA.

## 2012-11-16 ENCOUNTER — Ambulatory Visit (INDEPENDENT_AMBULATORY_CARE_PROVIDER_SITE_OTHER): Payer: Medicare Other | Admitting: Emergency Medicine

## 2012-11-16 ENCOUNTER — Ambulatory Visit: Payer: Medicare Other

## 2012-11-16 VITALS — BP 125/63 | HR 78 | Temp 97.6°F | Resp 16 | Ht 62.5 in | Wt 145.5 lb

## 2012-11-16 DIAGNOSIS — J9600 Acute respiratory failure, unspecified whether with hypoxia or hypercapnia: Secondary | ICD-10-CM

## 2012-11-16 DIAGNOSIS — I7090 Unspecified atherosclerosis: Secondary | ICD-10-CM

## 2012-11-16 DIAGNOSIS — I509 Heart failure, unspecified: Secondary | ICD-10-CM

## 2012-11-16 DIAGNOSIS — E119 Type 2 diabetes mellitus without complications: Secondary | ICD-10-CM

## 2012-11-16 DIAGNOSIS — Z23 Encounter for immunization: Secondary | ICD-10-CM

## 2012-11-16 DIAGNOSIS — J9601 Acute respiratory failure with hypoxia: Secondary | ICD-10-CM

## 2012-11-16 LAB — CBC WITH DIFFERENTIAL/PLATELET
Eosinophils Relative: 3 % (ref 0–5)
HCT: 30.5 % — ABNORMAL LOW (ref 36.0–46.0)
Hemoglobin: 10.4 g/dL — ABNORMAL LOW (ref 12.0–15.0)
Lymphocytes Relative: 24 % (ref 12–46)
MCV: 93.3 fL (ref 78.0–100.0)
Monocytes Absolute: 0.7 10*3/uL (ref 0.1–1.0)
Monocytes Relative: 9 % (ref 3–12)
Neutro Abs: 5.4 10*3/uL (ref 1.7–7.7)
WBC: 8.6 10*3/uL (ref 4.0–10.5)

## 2012-11-16 NOTE — Progress Notes (Signed)
  Subjective:    Patient ID: Suzanne [name], female    DOB: Aug 03, 1921, 79 y.o.   MRN: [id number]  HPI patient in for recheck. She overall is doing well. She continues to get dyspnea with minimal exertion. She has had no chest pain. She wears her oxygen continuously when she is sitting in a chair and when she is sleeping at night. The ulcer on the back of her right heel is not healing. She has not had any swelling in her legs. She has had no GI complaints    Review of Systems     Objective:   Physical Exam patient is alert and cooperative and in no distress. Her neck is supple. Her chest is clear to auscultation and percussion. Cardiac exam shows a 2/6 systolic murmur at the base of the heart.       [facility] reading (PRIMARY) by  Dr.Javoris Star patient has cardiomegaly. The previously described pleural effusion has resolved.    Assessment & Plan:  Patient's diabetes is stable on current dose of insulin. Her wound of her right heel has still not healed and she is referred back to the wound Center. She has recently seen Dr. Allyson Sabal with a good checkup. There may be a problem with her Symbicort and she may have to switch over to Advair but I believe she had difficulty using this in the past . Labs were done today check on the status of her chronic kidney disease and renal artery stenosis. Overall she is doing well. Flu shot was given.

## 2012-11-17 ENCOUNTER — Telehealth: Payer: Self-pay | Admitting: Emergency Medicine

## 2012-11-17 LAB — COMPREHENSIVE METABOLIC PANEL
Albumin: 3.9 g/dL (ref 3.5–5.2)
BUN: 41 mg/dL — ABNORMAL HIGH (ref 6–23)
CO2: 24 mEq/L (ref 19–32)
Calcium: 8.7 mg/dL (ref 8.4–10.5)
Chloride: 108 mEq/L (ref 96–112)
Glucose, Bld: 122 mg/dL — ABNORMAL HIGH (ref 70–99)
Potassium: 4.5 mEq/L (ref 3.5–5.3)

## 2012-11-17 NOTE — Telephone Encounter (Signed)
Called her daughter and let her know her mother's hemoglobin is still low and  [name] needs to stay on her vitamins and iron. Her kidney function is stable

## 2012-11-25 ENCOUNTER — Telehealth: Payer: Self-pay | Admitting: *Deleted

## 2012-11-25 NOTE — Telephone Encounter (Signed)
Called and gave stable lab results.

## 2012-11-26 ENCOUNTER — Encounter: Payer: Self-pay | Admitting: *Deleted

## 2012-12-03 ENCOUNTER — Encounter (HOSPITAL_BASED_OUTPATIENT_CLINIC_OR_DEPARTMENT_OTHER): Payer: Medicare Other | Attending: General Surgery

## 2012-12-03 DIAGNOSIS — L97409 Non-pressure chronic ulcer of unspecified heel and midfoot with unspecified severity: Secondary | ICD-10-CM | POA: Insufficient documentation

## 2012-12-03 DIAGNOSIS — E1169 Type 2 diabetes mellitus with other specified complication: Secondary | ICD-10-CM | POA: Insufficient documentation

## 2012-12-10 ENCOUNTER — Other Ambulatory Visit: Payer: Self-pay | Admitting: Cardiovascular Disease

## 2012-12-11 LAB — PROTIME-INR
INR: 3.63 — ABNORMAL HIGH (ref <1.50)
Prothrombin Time: 34.3 seconds — ABNORMAL HIGH (ref 11.6–15.2)

## 2012-12-13 ENCOUNTER — Ambulatory Visit (INDEPENDENT_AMBULATORY_CARE_PROVIDER_SITE_OTHER): Payer: Medicare Other | Admitting: Pharmacist Clinician (PhC)/ Clinical Pharmacy Specialist

## 2012-12-13 DIAGNOSIS — I82409 Acute embolism and thrombosis of unspecified deep veins of unspecified lower extremity: Secondary | ICD-10-CM

## 2012-12-13 DIAGNOSIS — Z7901 Long term (current) use of anticoagulants: Secondary | ICD-10-CM

## 2012-12-14 ENCOUNTER — Other Ambulatory Visit (HOSPITAL_COMMUNITY): Payer: Self-pay | Admitting: Cardiovascular Disease

## 2012-12-14 DIAGNOSIS — I701 Atherosclerosis of renal artery: Secondary | ICD-10-CM

## 2012-12-24 ENCOUNTER — Other Ambulatory Visit: Payer: Self-pay | Admitting: Cardiovascular Disease

## 2012-12-24 ENCOUNTER — Ambulatory Visit (HOSPITAL_COMMUNITY)
Admission: RE | Admit: 2012-12-24 | Discharge: 2012-12-24 | Disposition: A | Discharge status: Home / Self Care | Payer: Medicare Other | Source: Ambulatory Visit | Attending: Internal Medicine | Admitting: Internal Medicine

## 2012-12-24 DIAGNOSIS — N281 Cyst of kidney, acquired: Secondary | ICD-10-CM | POA: Insufficient documentation

## 2012-12-24 DIAGNOSIS — I701 Atherosclerosis of renal artery: Secondary | ICD-10-CM

## 2012-12-24 DIAGNOSIS — I10 Essential (primary) hypertension: Secondary | ICD-10-CM

## 2012-12-24 LAB — PROTIME-INR
INR: 3.18 — ABNORMAL HIGH (ref <1.50)
Prothrombin Time: 31.1 seconds — ABNORMAL HIGH (ref 11.6–15.2)

## 2012-12-24 NOTE — Progress Notes (Signed)
Renal Duplex Completed. Suzanne [name], BS, RDMS, RVT  

## 2012-12-27 ENCOUNTER — Ambulatory Visit (INDEPENDENT_AMBULATORY_CARE_PROVIDER_SITE_OTHER): Payer: Medicare Other | Admitting: Pharmacist Clinician (PhC)/ Clinical Pharmacy Specialist

## 2012-12-27 DIAGNOSIS — Z7901 Long term (current) use of anticoagulants: Secondary | ICD-10-CM

## 2012-12-27 DIAGNOSIS — I82409 Acute embolism and thrombosis of unspecified deep veins of unspecified lower extremity: Secondary | ICD-10-CM

## 2013-01-04 ENCOUNTER — Encounter: Payer: Self-pay | Admitting: *Deleted

## 2013-01-21 ENCOUNTER — Encounter (HOSPITAL_BASED_OUTPATIENT_CLINIC_OR_DEPARTMENT_OTHER): Payer: Medicare Other | Attending: General Surgery

## 2013-01-21 DIAGNOSIS — L97409 Non-pressure chronic ulcer of unspecified heel and midfoot with unspecified severity: Secondary | ICD-10-CM | POA: Insufficient documentation

## 2013-01-21 DIAGNOSIS — E1169 Type 2 diabetes mellitus with other specified complication: Secondary | ICD-10-CM | POA: Insufficient documentation

## 2013-01-25 ENCOUNTER — Telehealth: Payer: Self-pay | Admitting: Sports Medicine

## 2013-01-25 ENCOUNTER — Encounter (HOSPITAL_COMMUNITY): Payer: Self-pay | Admitting: Emergency Medicine

## 2013-01-25 ENCOUNTER — Ambulatory Visit: Payer: Medicare Other

## 2013-01-25 ENCOUNTER — Inpatient Hospital Stay (HOSPITAL_COMMUNITY)
Admission: EM | Admit: 2013-01-25 | Discharge: 2013-01-27 | DRG: 871 | Disposition: A | Discharge status: Home / Self Care | Payer: Medicare Other | Attending: Family Medicine | Admitting: Family Medicine

## 2013-01-25 ENCOUNTER — Ambulatory Visit (INDEPENDENT_AMBULATORY_CARE_PROVIDER_SITE_OTHER): Payer: Medicare Other | Admitting: Emergency Medicine

## 2013-01-25 ENCOUNTER — Encounter: Payer: Self-pay | Admitting: Emergency Medicine

## 2013-01-25 VITALS — BP 90/50 | HR 83 | Temp 97.9°F | Resp 20 | Ht 62.5 in | Wt 142.5 lb

## 2013-01-25 DIAGNOSIS — A419 Sepsis, unspecified organism: Principal | ICD-10-CM | POA: Diagnosis present

## 2013-01-25 DIAGNOSIS — Z9981 Dependence on supplemental oxygen: Secondary | ICD-10-CM

## 2013-01-25 DIAGNOSIS — Z794 Long term (current) use of insulin: Secondary | ICD-10-CM

## 2013-01-25 DIAGNOSIS — I739 Peripheral vascular disease, unspecified: Secondary | ICD-10-CM | POA: Diagnosis present

## 2013-01-25 DIAGNOSIS — R5381 Other malaise: Secondary | ICD-10-CM

## 2013-01-25 DIAGNOSIS — J449 Chronic obstructive pulmonary disease, unspecified: Secondary | ICD-10-CM | POA: Diagnosis present

## 2013-01-25 DIAGNOSIS — E782 Mixed hyperlipidemia: Secondary | ICD-10-CM | POA: Diagnosis present

## 2013-01-25 DIAGNOSIS — L97409 Non-pressure chronic ulcer of unspecified heel and midfoot with unspecified severity: Secondary | ICD-10-CM | POA: Diagnosis present

## 2013-01-25 DIAGNOSIS — E119 Type 2 diabetes mellitus without complications: Secondary | ICD-10-CM

## 2013-01-25 DIAGNOSIS — D649 Anemia, unspecified: Secondary | ICD-10-CM | POA: Diagnosis present

## 2013-01-25 DIAGNOSIS — I959 Hypotension, unspecified: Secondary | ICD-10-CM

## 2013-01-25 DIAGNOSIS — Z7901 Long term (current) use of anticoagulants: Secondary | ICD-10-CM

## 2013-01-25 DIAGNOSIS — R062 Wheezing: Secondary | ICD-10-CM

## 2013-01-25 DIAGNOSIS — I701 Atherosclerosis of renal artery: Secondary | ICD-10-CM | POA: Diagnosis present

## 2013-01-25 DIAGNOSIS — I5030 Unspecified diastolic (congestive) heart failure: Secondary | ICD-10-CM | POA: Diagnosis present

## 2013-01-25 DIAGNOSIS — I4891 Unspecified atrial fibrillation: Secondary | ICD-10-CM

## 2013-01-25 DIAGNOSIS — J4489 Other specified chronic obstructive pulmonary disease: Secondary | ICD-10-CM | POA: Diagnosis present

## 2013-01-25 DIAGNOSIS — I509 Heart failure, unspecified: Secondary | ICD-10-CM | POA: Diagnosis present

## 2013-01-25 DIAGNOSIS — I2510 Atherosclerotic heart disease of native coronary artery without angina pectoris: Secondary | ICD-10-CM | POA: Diagnosis present

## 2013-01-25 DIAGNOSIS — N189 Chronic kidney disease, unspecified: Secondary | ICD-10-CM | POA: Diagnosis present

## 2013-01-25 DIAGNOSIS — Z86718 Personal history of other venous thrombosis and embolism: Secondary | ICD-10-CM

## 2013-01-25 DIAGNOSIS — M810 Age-related osteoporosis without current pathological fracture: Secondary | ICD-10-CM | POA: Diagnosis present

## 2013-01-25 DIAGNOSIS — F0390 Unspecified dementia without behavioral disturbance: Secondary | ICD-10-CM | POA: Diagnosis present

## 2013-01-25 DIAGNOSIS — Z66 Do not resuscitate: Secondary | ICD-10-CM | POA: Diagnosis present

## 2013-01-25 DIAGNOSIS — Z87891 Personal history of nicotine dependence: Secondary | ICD-10-CM

## 2013-01-25 DIAGNOSIS — Z79899 Other long term (current) drug therapy: Secondary | ICD-10-CM

## 2013-01-25 DIAGNOSIS — R300 Dysuria: Secondary | ICD-10-CM

## 2013-01-25 DIAGNOSIS — J189 Pneumonia, unspecified organism: Secondary | ICD-10-CM | POA: Diagnosis present

## 2013-01-25 DIAGNOSIS — N390 Urinary tract infection, site not specified: Secondary | ICD-10-CM | POA: Diagnosis present

## 2013-01-25 DIAGNOSIS — I129 Hypertensive chronic kidney disease with stage 1 through stage 4 chronic kidney disease, or unspecified chronic kidney disease: Secondary | ICD-10-CM | POA: Diagnosis present

## 2013-01-25 DIAGNOSIS — K5900 Constipation, unspecified: Secondary | ICD-10-CM | POA: Diagnosis present

## 2013-01-25 LAB — URINALYSIS, ROUTINE W REFLEX MICROSCOPIC
Ketones, ur: NEGATIVE mg/dL
Protein, ur: NEGATIVE mg/dL
Specific Gravity, Urine: 1.008 (ref 1.005–1.030)
Urobilinogen, UA: 0.2 mg/dL (ref 0.0–1.0)
pH: 5.5 (ref 5.0–8.0)

## 2013-01-25 LAB — CBC WITH DIFFERENTIAL/PLATELET
Basophils Absolute: 0 10*3/uL (ref 0.0–0.1)
Eosinophils Absolute: 0.1 10*3/uL (ref 0.0–0.7)
Eosinophils Relative: 1 % (ref 0–5)
HCT: 31.6 % — ABNORMAL LOW (ref 36.0–46.0)
Lymphocytes Relative: 19 % (ref 12–46)
Lymphs Abs: 2.1 10*3/uL (ref 0.7–4.0)
MCH: 31.7 pg (ref 26.0–34.0)
MCV: 98.1 fL (ref 78.0–100.0)
Monocytes Absolute: 1.3 10*3/uL — ABNORMAL HIGH (ref 0.1–1.0)
Platelets: 151 10*3/uL (ref 150–400)
RBC: 3.22 MIL/uL — ABNORMAL LOW (ref 3.87–5.11)
RDW: 13.3 % (ref 11.5–15.5)

## 2013-01-25 LAB — POCT URINALYSIS DIPSTICK
Glucose, UA: NEGATIVE
Nitrite, UA: NEGATIVE
Protein, UA: 100
Spec Grav, UA: 1.02
Urobilinogen, UA: 0.2
pH, UA: 5.5

## 2013-01-25 LAB — POCT UA - MICROSCOPIC ONLY
Casts, Ur, LPF, POC: NEGATIVE
Crystals, Ur, HPF, POC: NEGATIVE
Mucus, UA: POSITIVE
Yeast, UA: NEGATIVE

## 2013-01-25 LAB — POCT CBC
Granulocyte percent: 71.8 %G (ref 37–80)
Lymph, poc: 2.4 (ref 0.6–3.4)
MCHC: 29.9 g/dL — AB (ref 31.8–35.4)
MCV: 101.3 fL — AB (ref 80–97)
MID (cbc): 0.8 (ref 0–0.9)
POC Granulocyte: 8.2 — AB (ref 2–6.9)
POC LYMPH PERCENT: 21.3 %L (ref 10–50)
Platelet Count, POC: 167 10*3/uL (ref 142–424)
RDW, POC: 14.3 %
WBC: 11.4 10*3/uL — AB (ref 4.6–10.2)

## 2013-01-25 LAB — COMPREHENSIVE METABOLIC PANEL
ALT: 20 U/L (ref 0–35)
AST: 26 U/L (ref 0–37)
Alkaline Phosphatase: 48 U/L (ref 39–117)
BUN: 50 mg/dL — ABNORMAL HIGH (ref 6–23)
CO2: 22 mEq/L (ref 19–32)
Calcium: 8.7 mg/dL (ref 8.4–10.5)
Chloride: 100 mEq/L (ref 96–112)
Creatinine, Ser: 2.22 mg/dL — ABNORMAL HIGH (ref 0.50–1.10)
GFR calc Af Amer: 21 mL/min — ABNORMAL LOW (ref >90)
GFR calc non Af Amer: 18 mL/min — ABNORMAL LOW (ref >90)
Glucose, Bld: 207 mg/dL — ABNORMAL HIGH (ref 70–99)
Sodium: 136 mEq/L (ref 135–145)
Total Bilirubin: 0.4 mg/dL (ref 0.3–1.2)
Total Protein: 6.4 g/dL (ref 6.0–8.3)

## 2013-01-25 LAB — LIPASE, BLOOD: Lipase: 45 U/L (ref 11–59)

## 2013-01-25 LAB — PROTIME-INR: INR: 2.59 — ABNORMAL HIGH (ref 0.00–1.49)

## 2013-01-25 LAB — URINE MICROSCOPIC-ADD ON

## 2013-01-25 LAB — PRO B NATRIURETIC PEPTIDE: Pro B Natriuretic peptide (BNP): 1207 pg/mL — ABNORMAL HIGH (ref 0–450)

## 2013-01-25 LAB — POCT GLYCOSYLATED HEMOGLOBIN (HGB A1C): Hemoglobin A1C: 5.9

## 2013-01-25 MED ORDER — DEXTROSE 5 % IV SOLN
1.0000 g | Freq: Once | INTRAVENOUS | Status: AC
  Administered 2013-01-25: 1 g via INTRAVENOUS
  Filled 2013-01-25: qty 10

## 2013-01-25 MED ORDER — SODIUM CHLORIDE 0.9 % IV SOLN
1000.0000 mL | INTRAVENOUS | Status: DC
  Administered 2013-01-25: 1000 mL via INTRAVENOUS

## 2013-01-25 MED ORDER — SODIUM CHLORIDE 0.9 % IV SOLN
1000.0000 mL | Freq: Once | INTRAVENOUS | Status: AC
  Administered 2013-01-25: 1000 mL via INTRAVENOUS

## 2013-01-25 MED ORDER — HYDROCORTISONE SOD SUCCINATE 100 MG IJ SOLR
50.0000 mg | Freq: Four times a day (QID) | INTRAMUSCULAR | Status: DC
  Administered 2013-01-26 (×2): 50 mg via INTRAVENOUS
  Filled 2013-01-25: qty 2
  Filled 2013-01-25: qty 1

## 2013-01-25 MED ORDER — MORPHINE SULFATE 4 MG/ML IJ SOLN
2.0000 mg | Freq: Once | INTRAMUSCULAR | Status: AC
  Administered 2013-01-25: 2 mg via INTRAVENOUS
  Filled 2013-01-25: qty 1

## 2013-01-25 NOTE — H&P (Signed)
Family Medicine [facility] Admission History and Physical Service Pager: [phone]  Patient name: Suzanne [name] Medical record number: [id number] Date of birth: 01-Jun-1921 Age: 79 y.o. Gender: female  Primary Care Provider: Lucilla Edin, MD Consultants: none Code Status: DNR/DNI  Chief Complaint: dysuria  Assessment and Plan: [name] is a 78 y.o. female presenting with a week of worsening fever, chills, and dysuria. PMH is significant for CHF, COPD, DM, RAS, CKD, CAD, DVT, dementia.  # UTI, possible urosepsis: Hypotensive and feeling unwell, UA with moderate LE (nitrite negative), moderate hgb, 11-20 WBC, 7-10 RBC, few bacteria, rigors - admit to step-down, attending Dr. McDiarmid, admitting Dr. Leveda Anna - s/p CTX in ED, vanc/zosyn ordered for broad coverage pending normotension and cultures - UCx and BCx pending  # Hypotension; Hx of difficult to control HTN: In setting of known UTI and rigors, likely sepsis, also with productive cough, initial CXR negative - s/p 2L bolus in ED - NS@75 , volume overload on exam and diastolic CHF history - repeat CXR in am after fluids - antibiotics as above - monitor on tele - Lactate and procalcitonin reassured - Stress dose steroids, cortisol level drawn prior no recent high-dose steroid use but does have remote history.;   # Diastolic CHF with Hx of mild CAD by cath (10/12/12): Grade 1 diastolic with EF of 55-60% (ECHO Dec,31 2012).  Heart sounds difficult to auscultate on exam - continue to monitor; okay for more aggressive fluid resuscitation persistently hypotensive.  # COPD: History of 2.5 L oxygen dependence at home at rest.  No recent steroid use.  Worsening productive cough.  No evidence of pneumonia on chest x-ray initially  # HTN: holding home antihypertensives while hypotensive, will restart as needed  # DM: on 16u lantus at home, last A1C 5.9 - 8u lantus while inpatient + sensitive SSI  # DVT: Currently  anticoagulated, therapeutic on warfarin - continue warfarin, dosing per pharm  # Heel ulcer: chronic and uninfected - consult to wound care - Poor capillary refill   #PVD (vasculopath): Multiple prior procedures including femoropopliteal bypass,  AAA repair,  with known renal artery stenosis bilaterally.   - Given multiple bypass grafts obviously if any objective evidence of bacteremia needs to be more closely evaluated.    FEN/GI: carb mod diet, NS@75  Prophylaxis: SQ heparin  Disposition: Admit to step down  History of Present Illness: Suzanne [name] is a 79 y.o. female presenting with about a week of fever, chills, dysuria. No dizziness, lightheadedness, CP, SOB. Has baseline DOE which has not changed. Cough becoming more productive since Sunday. No abdominal pain. Feels bloated. + Rigors; reports having shaking throughout her whole body today only, this has happened twice and only lasted a few minutes. Back pain is at baseline.  Review Of Systems: Per HPI  Otherwise 12 point review of systems was performed and was unremarkable.  Patient Active Problem List   Diagnosis Date Noted  . UTI (lower urinary tract infection) 01/25/2013  . Long term (current) use of anticoagulants 05/12/2012  . DVT (deep venous thrombosis), unspecified laterality 05/12/2012  . PAD (peripheral artery disease) 03/03/2012  . Liver cyst 02/26/2012  . Cyst, kidney, acquired 02/26/2012  . Kidney stone 02/26/2012  . Chronic kidney disease 11/19/2011  . COPD (chronic obstructive pulmonary disease) 03/18/2011  . Osteoporosis 12/17/2010  . Type II or unspecified type diabetes mellitus without mention of complication, not stated as uncontrolled   . PVD (peripheral vascular disease)   .  Mixed hyperlipidemia   . Renal artery stenosis   . Memory loss, short term   . Pulmonary nodule   . CAD (coronary artery disease)   . CHF (congestive heart failure)   . Anemia    Past Medical History: Past Medical History   Diagnosis Date  . Type II or unspecified type diabetes mellitus without mention of complication, not stated as uncontrolled   . PVD (peripheral vascular disease)     PTA & stent right superficial artery PTA & left common iliac artery stenting. Right fem-pop bypass graft  . Mixed hyperlipidemia   . Renal artery stenosis   . Compression fx, thoracic spine     T12  . Ischium fracture   . Memory loss, short term   . Pulmonary nodule   . CAD (coronary artery disease) 10/12/2008    :30% LAD,30% CX & 40% tandem mid stenosis in the AV groove, 50-60% RCA  . CHF (congestive heart failure) 09-2008  . Anemia   . COPD (chronic obstructive pulmonary disease)   . Pneumonia   . Detached retina 02-2005    R EYE  . Fracture 02-2007    L5 AND PELVIS  . COPD (chronic obstructive pulmonary disease) 03/18/2011  . Left ventricular dysfunction     Moderate by cath,EF approx. 40% with mod. global hypokinesis  . AAA (abdominal aortic aneurysm)     Repair unknown date   Past Surgical History: Past Surgical History  Procedure Laterality Date  . Aorta surgery      aortic anyuersm surgery.   . Vessel surgery      S/P aortobifemoral bypass grafting and right fem-peroneal bypass grafting remotely  . Cholecystectomy    . Cataract surgery  06-2006    R EYE  . Vascular surgery    . Cardiac catheterization     Social History: History  Substance Use Topics  . Smoking status: Former Smoker    Types: Cigarettes    Quit date: 02/16/1997  . Smokeless tobacco: Never Used  . Alcohol Use: No   Additional social history: Lives alone with daughter visiting daily for dressing changes  Please also refer to relevant sections of EMR.  Family History: Family History  Problem Relation Age of Onset  . Tuberculosis Mother   . Cancer Father    Allergies and Medications: Allergies  Allergen Reactions  . Codeine Other (See Comments)    hallucinations  . Hydrochlorothiazide Other (See Comments)    hypotension   . Other     Adhesives and bandages gives pt a rash.  . Pregabalin Other (See Comments)    unknown   No current facility-administered medications on file prior to encounter.   Current Outpatient Prescriptions on File Prior to Encounter  Medication Sig Dispense Refill  . budesonide-formoterol (SYMBICORT) 160-4.5 MCG/ACT inhaler Inhale 2 puffs into the lungs 2 (two) times daily.  3 Inhaler  3  . esomeprazole (NEXIUM) 40 MG capsule Take 1 capsule (40 mg total) by mouth daily before breakfast.  90 capsule  3  . furosemide (LASIX) 40 MG tablet Take 1 tablet (40 mg total) by mouth daily.  90 tablet  0  . lidocaine (LIDODERM) 5 % Place 1 patch onto the skin daily. Remove & Discard patch within 12 hours or as directed by MD  90 patch  3  . losartan (COZAAR) 100 MG tablet Take 1 tablet (100 mg total) by mouth daily.  90 tablet  3  . ranitidine (ZANTAC) 150 MG tablet Take 1  tablet (150 mg total) by mouth 2 (two) times daily.  180 tablet  11  . Alcohol Swabs (WEBCOL ALCOHOL PREP MEDIUM) 70 % PADS 1 application by Does not apply route 1 day or 1 dose.  100 each  3  . Blood Glucose Monitoring Suppl (BLOOD GLUCOSE METER) kit Use as instructed  1 each  0  . glucose blood test strip Use as instructed  100 each  12  . Insulin Pen Needle 31G X 5 MM MISC 16 Units by Does not apply route daily.      . Insulin Syringe-Needle U-100 30G X 1/2" 1 ML MISC 20 Units by Does not apply route daily.  100 each  3  . Lancets (ONETOUCH ULTRASOFT) lancets Insulin dependant diabetes patient checks blood sugars tid  300 each  4    Objective: BP 118/50  Pulse 82  Temp(Src) 98 F (36.7 C)  Resp 18  SpO2 97% Exam: General: elderly woman, lying in bed, NAD HEENT: MMM,  Cardiovascular: distant heart sounds, dp pulses acid.  Capillary refill greater than 6 seconds.  Femoral pulses 1+ out of 4 bilaterally ,  Respiratory: bibasilar crackles, normal WOB, baseline oxygen supplementation Abdomen: No CVA tenderness, suprapubic  tenderness Extremities: levido reticularis, poor perfusion bilaterally L>R Skin: 1cm ulceration on inferior left heel, no erythema, collagen patch in place, well healed 2cm ulceration on inferior surface of left great toe Neuro: alert, grossly nonfocal, baseline short-term memory loss  Labs and Imaging: CBC BMET   Recent Labs Lab 01/25/13 1915  WBC 10.9*  HGB 10.2*  HCT 31.6*  PLT 151    Recent Labs Lab 01/25/13 1915  NA 136  K 3.6  CL 100  CO2 22  BUN 50*  CREATININE 2.22*  GLUCOSE 207*  CALCIUM 8.7      01/25/2013 20:54  Color, Urine YELLOW  APPearance CLOUDY (A)  Specific Gravity 1.008  pH 5.5  Glucose NEGATIVE  Bilirubin  NEGATIVE  Ketones NEGATIVE  Protein NEGATIVE  Urobilinogen 0.2  Nitrite NEGATIVE  Leukocytes MODERATE (A)  Hgb MODERATE (A)  WBC 11-20  RBC / HPF 7-10  Squamous Epithelial / LPF RARE  Bacteria, UA FEW (A)   Lactic acid 0.92 INR 2.59 ProBNP 1207 Procalcitonin 0.11  CXR: The lungs are emphysematous but clear. There is cardiomegaly. No pneumothorax or pleural fluid. Remote compression fracture thoracolumbar junction noted  Beverely Low, MD 01/25/2013, 10:48 PM PGY-1, [facility] FPT[facility] Intern pager: [phone], text pages welcome    R2 Addendum:  I have seen the above patient and have discussed the patient's presentation, history, objective data, physical exam and assessment and plan with Dr. Richarda Blade.  Briefly, this patient is a 79 y.o. year old female who presented to urgent family medical care for a routine visit and was found to be hypotensive with markedly abnormal urine.  She was transferred to the emergency department for further evaluation and was found to have persistent hypotension with moderate fluid resuscitation.  Broad-spectrum antibiotics will be started  given unknown organism (nitrite negative, suggesting not E.coli) and symptoms concerning for sepsis.   > Plan to de-escalate antibiotics as soon as possible  pending cultures. - Close monitoring for any worsening vital sign instability.   - follow up cortisol level and then plan to discontinue Solu-Cortef if greater than 20  - Monitor fluid status closely, and persistently hypotensive consider additional bolus vs CCM consult - Patient is a Limited code with no chest compressions & no intubation; her  and her family would like vasopressor support if indicated.  Andrena Mews, DO [facility] Resident - PGY-3 01/26/2013 1:15 AM

## 2013-01-25 NOTE — ED Notes (Signed)
Blood cultures being drawn at present.  Will start rocephin iv when labs drawn

## 2013-01-25 NOTE — ED Notes (Signed)
Pt's daughter st's pt has been napping off and on.  Pt st's she feels better.

## 2013-01-25 NOTE — ED Notes (Signed)
The pt is alert the only pain she has is from her bp being taken.  Daughter at the bedside

## 2013-01-25 NOTE — Telephone Encounter (Signed)
[facility] 24 Hour After-hours Emergency Line  Patient name: Suzanne [name]  MRN: [id number]  AGE: 79 y.o.  Gender: female DOB: 03/14/1921     Primary Care Provider:   Lucilla Edin, MD     Received call from Dr. Cleta Alberts regarding direct admission. After discussing case with Dr. Cleta Alberts felt best to have patient be admitted through the ED given vital sign instability for Sepsis Evaluation.   --- DISPOSITION: To ED; after evaluation, if stable and not needing critical care services will plan to admit to [facility].     Andrena Mews, DO [facility] Resident - PGY-3 01/25/2013 7:41 PM

## 2013-01-25 NOTE — ED Notes (Signed)
Pt to ED via [facility] from Dr. Ellis Parents office for hospital admission due to possible urosepsis.  Pt went to PCP office for evaluation of weakness and "not feeling right" over the past few days.  PCP found pt to have UTI- IV in place.  Pt alert and oriented X 4.

## 2013-01-25 NOTE — Progress Notes (Addendum)
Subjective:    Patient ID: MEHREEN AZIZI, female    DOB: [birth year], 79 y.o.   MRN: [id number] This chart was scribed for Dr Cleta Alberts by Danella Maiers, ED Scribe.  Chief Complaint  Patient presents with  . Diabetes    HPI   Pt is an 79 y.o. Diabetic female who presents with fever, cough, and urgency to urinate. She has felt extremely fatigued today. She has had persistent cough but no change from previous. She has had no increasing confusion or focal upper or lower extremity weakness. She has had no recent changes in her medications.    Review of Systems     Objective:   Physical Exam  Constitutional: She appears well-developed.  HENT:  Head: Normocephalic.  Right Ear: External ear normal.  Left Ear: External ear normal.  Eyes: Pupils are equal, round, and reactive to light.  Nasal congestion  Cardiovascular: Normal rate, regular rhythm and normal heart sounds.   Pulmonary/Chest: She has wheezes. She has rales.  Abdominal: Soft. There is no tenderness. There is no rebound.  Skin: Skin is warm and dry.  Tender perianal area  [facility] reading (PRIMARY) by  Dr. Cleta Alberts COPD no acute disease. The urethral area was prepped with Betadine x2. 14 French catheter was used in her urine was obtained under sterile technique patient tolerated the procedure well.  Results for orders placed in visit on 01/25/13  POCT CBC      Result Value Range   WBC 11.4 (*) 4.6 - 10.2 K/uL   Lymph, poc 2.4  0.6 - 3.4   POC LYMPH PERCENT 21.3  10 - 50 %L   MID (cbc) 0.8  0 - 0.9   POC MID % 6.9  0 - 12 %M   POC Granulocyte 8.2 (*) 2 - 6.9   Granulocyte percent 71.8  37 - 80 %G   RBC 3.44 (*) 4.04 - 5.48 M/uL   Hemoglobin 10.4 (*) 12.2 - 16.2 g/dL   HCT, POC 11.9 (*) 14.7 - 47.9 %   MCV 101.3 (*) 80 - 97 fL   MCH, POC 30.2  27 - 31.2 pg   MCHC 29.9 (*) 31.8 - 35.4 g/dL   RDW, POC 82.9     Platelet Count, POC 167  142 - 424 K/uL   MPV 8.6  0 - 99.8 fL  GLUCOSE, POCT (MANUAL RESULT ENTRY)      Result  Value Range   POC Glucose 148 (*) 70 - 99 mg/dl  POCT GLYCOSYLATED HEMOGLOBIN (HGB A1C)      Result Value Range   Hemoglobin A1C 5.9    POCT UA - MICROSCOPIC ONLY      Result Value Range   WBC, Ur, HPF, POC tntc     RBC, urine, microscopic tntc     Bacteria, U Microscopic 4+     Mucus, UA pos     Epithelial cells, urine per micros neg     Crystals, Ur, HPF, POC neg     Casts, Ur, LPF, POC neg     Yeast, UA neg    POCT URINALYSIS DIPSTICK      Result Value Range   Color, UA yellow     Clarity, UA turbid     Glucose, UA neg     Bilirubin, UA neg     Ketones, UA neg     Spec Grav, UA 1.020     Blood, UA mod     pH, UA 5.5  Protein, UA 100     Urobilinogen, UA 0.2     Nitrite, UA neg     Leukocytes, UA small (1+)           Assessment & Plan:  Culture sent. To ER with UTI and hypotension. Dr. Berline Chough aware of situation.  I personally performed the services described in this documentation, which was scribed in my presence. The recorded information has been reviewed and is accurate.

## 2013-01-25 NOTE — ED Provider Notes (Signed)
CSN: [id number]     Arrival date & time 01/25/13  1857 History   First MD Initiated Contact with Patient 01/25/13 1915     Chief Complaint  Patient presents with  . Weakness   (Consider location/radiation/quality/duration/timing/severity/associated sxs/prior Treatment) HPI Patient presents with concern of weakness, nausea, possible confusion. History of present illness is per the patient and her daughter. It seems as though the patient began feeling unwell yesterday without clear precipitant.  Over the interval day she has had persistent symptoms. Patient denies overt urinary changes, abdominal pain, vomiting, diarrhea, fever. She does complain of chills. Patient saw her physician today and was referred here after she was found to be hypotensive, with chills.  Past Medical History  Diagnosis Date  . Type II or unspecified type diabetes mellitus without mention of complication, not stated as uncontrolled   . PVD (peripheral vascular disease)     PTA & stent right superficial artery PTA & left common iliac artery stenting. Right fem-pop bypass graft  . Mixed hyperlipidemia   . Renal artery stenosis   . Compression fx, thoracic spine     T12  . Ischium fracture   . Memory loss, short term   . Pulmonary nodule   . CAD (coronary artery disease) 10/12/2008    :30% LAD,30% CX & 40% tandem mid stenosis in the AV groove, 50-60% RCA  . CHF (congestive heart failure) 09-2008  . Anemia   . COPD (chronic obstructive pulmonary disease)   . Pneumonia   . Detached retina 02-2005    R EYE  . Fracture 02-2007    L5 AND PELVIS  . COPD (chronic obstructive pulmonary disease) 03/18/2011  . Left ventricular dysfunction     Moderate by cath,EF approx. 40% with mod. global hypokinesis  . AAA (abdominal aortic aneurysm)     Repair unknown date   Past Surgical History  Procedure Laterality Date  . Aorta surgery      aortic anyuersm surgery.   . Vessel surgery      S/P aortobifemoral bypass  grafting and right fem-peroneal bypass grafting remotely  . Cholecystectomy    . Cataract surgery  06-2006    R EYE  . Vascular surgery    . Cardiac catheterization     Family History  Problem Relation Age of Onset  . Tuberculosis Mother   . Cancer Father    History  Substance Use Topics  . Smoking status: Former Smoker    Types: Cigarettes    Quit date: 02/16/1997  . Smokeless tobacco: Never Used  . Alcohol Use: No   OB History   Grav Para Term Preterm Abortions TAB SAB Ect Mult Living                 Review of Systems  Constitutional:       Per HPI, otherwise negative  HENT:       Per HPI, otherwise negative  Respiratory:       Per HPI, otherwise negative  Cardiovascular:       Per HPI, otherwise negative  Gastrointestinal: Negative for vomiting.  Endocrine:       Negative aside from HPI  Genitourinary:       Neg aside from HPI   Musculoskeletal:       Per HPI, otherwise negative  Skin: Negative.   Neurological: Negative for syncope.    Allergies  Codeine; Hydrochlorothiazide; Other; and Pregabalin  Home Medications   Current Outpatient Rx  Name  Route  Sig  Dispense  Refill  . Insulin Glargine (LANTUS SOLOSTAR) 100 UNIT/ML SOPN   Subcutaneous   Inject 16 Units into the skin at bedtime.         [name] losartan (COZAAR) 100 MG tablet   Oral   Take 1 tablet (100 mg total) by mouth daily.   90 tablet   3   . metoprolol succinate (TOPROL-XL) 25 MG 24 hr tablet   Oral   Take 25 mg by mouth at bedtime.         . rosuvastatin (CRESTOR) 20 MG tablet   Oral   Take 20 mg by mouth daily.         [name] warfarin (COUMADIN) 2.5 MG tablet   Oral   Take 1.25-2.5 mg by mouth daily at 6 PM. Take 1/2 tablet (1.25 mg) on Monday, Wednesday and Friday), take 1 tablet (2.5 mg) on Tuesday, Thursday, Saturday, Sunday         . albuterol (PROVENTIL) (2.5 MG/3ML) 0.083% nebulizer solution   Nebulization   Take 3 mLs (2.5 mg total) by nebulization every 6 (six) hours  as needed.   225 mL   3   . albuterol-ipratropium (COMBIVENT) 18-103 MCG/ACT inhaler   Inhalation   Inhale 2 puffs into the lungs every 4 (four) hours as needed. For wheezing         . Alcohol Swabs (WEBCOL ALCOHOL PREP MEDIUM) 70 % PADS   Does not apply   1 application by Does not apply route 1 day or 1 dose.   100 each   3   . Blood Glucose Monitoring Suppl (BLOOD GLUCOSE METER) kit      Use as instructed   1 each   0     Insulin dependant diabetes patient checks glucose  ...   . budesonide-formoterol (SYMBICORT) 160-4.5 MCG/ACT inhaler   Inhalation   Inhale 2 puffs into the lungs 2 (two) times daily.   3 Inhaler   3   . calcium-vitamin D (OSCAL WITH D) 500-200 MG-UNIT per tablet   Oral   Take 1 tablet by mouth 2 (two) times daily.   180 tablet   3   . esomeprazole (NEXIUM) 40 MG capsule   Oral   Take 1 capsule (40 mg total) by mouth daily before breakfast.   90 capsule   3   . ferrous sulfate 325 (65 FE) MG tablet   Oral   Take 1 tablet (325 mg total) by mouth daily.   90 tablet   3   . furosemide (LASIX) 40 MG tablet   Oral   Take 1 tablet (40 mg total) by mouth daily.   90 tablet   0   . glucose blood test strip      Use as instructed   100 each   12     To check blood sugars tid pt with insulin dependan ...   . ibandronate (BONIVA) 150 MG tablet      1 PO QAM 8 oz of water, on an empty stomach, take nothing else by mouth or lie down for 30 min once a month   3 tablet   3   . Insulin Glargine (LANTUS SOLOSTAR) 100 UNIT/ML SOPN      Inject 16 units daily. Dx code 250.00.   15 mL   1   . Insulin Pen Needle 31G X 5 MM MISC   Does not apply   16 Units by Does not apply route daily.         [name]  Insulin Syringe-Needle U-100 30G X 1/2" 1 ML MISC   Does not apply   20 Units by Does not apply route daily.   100 each   3   . Ipratropium-Albuterol (COMBIVENT RESPIMAT) 20-100 MCG/ACT AERS respimat   Inhalation   Inhale 1 puff into the  lungs every 6 (six) hours.   3 Inhaler   3   . ipratropium-albuterol (DUONEB) 0.5-2.5 (3) MG/3ML SOLN   Nebulization   Take 3 mLs by nebulization 3 (three) times daily.   360 mL   12   . Lancets (ONETOUCH ULTRASOFT) lancets      Insulin dependant diabetes patient checks blood sugars tid   300 each   4   . lidocaine (LIDODERM) 5 %   Transdermal   Place 1 patch onto the skin daily. Remove & Discard patch within 12 hours or as directed by MD   90 patch   3   . metoprolol succinate (TOPROL-XL) 25 MG 24 hr tablet   Oral   Take 1 tablet (25 mg total) by mouth daily.   90 tablet   3   . Omega-3 Fatty Acids (FISH OIL) 1000 MG CAPS   Oral   Take 1 capsule (1,000 mg total) by mouth 2 (two) times daily.   180 capsule   3   . ranitidine (ZANTAC) 150 MG tablet   Oral   Take 1 tablet (150 mg total) by mouth 2 (two) times daily.   180 tablet   11   . traMADol (ULTRAM) 50 MG tablet   Oral   Take by mouth every 6 (six) hours as needed.          BP 118/50  Pulse 82  Temp(Src) 98 F (36.7 C)  Resp 18  SpO2 97% Physical Exam  Nursing note and vitals reviewed. Constitutional: She is oriented to person, place, and time. She appears well-developed and well-nourished. No distress.  HENT:  Head: Normocephalic and atraumatic.  Eyes: Conjunctivae and EOM are normal.  Cardiovascular: Normal rate and regular rhythm.   Pulmonary/Chest: Effort normal and breath sounds normal. No stridor. No respiratory distress. She has no wheezes.  Abdominal: She exhibits no distension. There is no tenderness.  Musculoskeletal: She exhibits no edema.  Neurological: She is alert and oriented to person, place, and time. No cranial nerve deficit.  Skin: Skin is warm and dry.  Nonhealing ulcer on the base of the right heel, no surrounding erythema, drainage.   Psychiatric: She has a normal mood and affect.    ED Course  Procedures (including critical care time) Labs Review Labs Reviewed  URINE  CULTURE  CBC WITH DIFFERENTIAL  COMPREHENSIVE METABOLIC PANEL  URINALYSIS, ROUTINE W REFLEX MICROSCOPIC  LIPASE, BLOOD  PROCALCITONIN  PRO B NATRIURETIC PEPTIDE  PROTIME-INR   Imaging Review Dg Chest 2 View  01/25/2013   CLINICAL DATA:  Cough and congestion.  EXAM: CHEST  2 VIEW  COMPARISON:  PA and lateral chest 11/16/2012.  CT chest 12/27/2009.  FINDINGS: The lungs are emphysematous but clear. There is cardiomegaly. No pneumothorax or pleural fluid. Remote compression fracture thoracolumbar junction noted.  IMPRESSION: No acute disease.  Emphysema.  Cardiomegaly.   Electronically Signed   By: Drusilla Kanner M.D.   On: 01/25/2013 19:40    EKG Interpretation   None       After the initial evaluation I reviewed the patient's chart, and transfer paperwork.   MDM  No diagnosis found. This patient presents with concern of urinary  tract infection.  On exam she is awake alert.  However the patient's leukocytosis, evidence of urinary tract infection, and with elevated BNP, fluid resuscitation is limited.  Patient was admitted for further evaluation and management.   Gerhard Munch, MD 01/26/13 [phone]

## 2013-01-26 ENCOUNTER — Inpatient Hospital Stay (HOSPITAL_COMMUNITY): Payer: Medicare Other

## 2013-01-26 DIAGNOSIS — J189 Pneumonia, unspecified organism: Secondary | ICD-10-CM

## 2013-01-26 DIAGNOSIS — E119 Type 2 diabetes mellitus without complications: Secondary | ICD-10-CM

## 2013-01-26 LAB — BASIC METABOLIC PANEL
BUN: 46 mg/dL — ABNORMAL HIGH (ref 6–23)
CO2: 23 mEq/L (ref 19–32)
Calcium: 8 mg/dL — ABNORMAL LOW (ref 8.4–10.5)
Creatinine, Ser: 2.1 mg/dL — ABNORMAL HIGH (ref 0.50–1.10)
GFR calc Af Amer: 23 mL/min — ABNORMAL LOW (ref >90)
GFR calc non Af Amer: 20 mL/min — ABNORMAL LOW (ref >90)
Glucose, Bld: 225 mg/dL — ABNORMAL HIGH (ref 70–99)
Sodium: 136 mEq/L (ref 135–145)

## 2013-01-26 LAB — GLUCOSE, CAPILLARY
Glucose-Capillary: 211 mg/dL — ABNORMAL HIGH (ref 70–99)
Glucose-Capillary: 185 mg/dL — ABNORMAL HIGH (ref 70–99)
Glucose-Capillary: 177 mg/dL — ABNORMAL HIGH (ref 70–99)

## 2013-01-26 LAB — CBC
HCT: 28.9 % — ABNORMAL LOW (ref 36.0–46.0)
Hemoglobin: 9.3 g/dL — ABNORMAL LOW (ref 12.0–15.0)
MCH: 31.5 pg (ref 26.0–34.0)
MCHC: 32.2 g/dL (ref 30.0–36.0)
MCV: 98 fL (ref 78.0–100.0)
RBC: 2.95 MIL/uL — ABNORMAL LOW (ref 3.87–5.11)
RDW: 13.3 % (ref 11.5–15.5)

## 2013-01-26 LAB — EXPECTORATED SPUTUM ASSESSMENT W GRAM STAIN, RFLX TO RESP C

## 2013-01-26 LAB — EXPECTORATED SPUTUM ASSESSMENT W REFEX TO RESP CULTURE

## 2013-01-26 LAB — STREP PNEUMONIAE URINARY ANTIGEN: Strep Pneumo Urinary Antigen: NEGATIVE

## 2013-01-26 MED ORDER — CALCIUM CARBONATE-VITAMIN D 500-200 MG-UNIT PO TABS
1.0000 | ORAL_TABLET | Freq: Every day | ORAL | Status: DC
  Administered 2013-01-26 – 2013-01-27 (×2): 1 via ORAL
  Filled 2013-01-26 (×4): qty 1

## 2013-01-26 MED ORDER — PANTOPRAZOLE SODIUM 40 MG PO TBEC
80.0000 mg | DELAYED_RELEASE_TABLET | Freq: Every day | ORAL | Status: DC
  Administered 2013-01-26 – 2013-01-27 (×2): 80 mg via ORAL
  Filled 2013-01-26 (×2): qty 2

## 2013-01-26 MED ORDER — CALCIUM CARBONATE-VITAMIN D 600-400 MG-UNIT PO TABS: 1.0000 | ORAL_TABLET | Freq: Every day | ORAL | Status: DC

## 2013-01-26 MED ORDER — WARFARIN 1.25 MG HALF TABLET
1.2500 mg | ORAL_TABLET | ORAL | Status: DC
  Administered 2013-01-26: 1.25 mg via ORAL
  Filled 2013-01-26 (×2): qty 1

## 2013-01-26 MED ORDER — ACETAMINOPHEN 500 MG PO TABS: 500.0000 mg | ORAL_TABLET | ORAL | Status: DC | PRN

## 2013-01-26 MED ORDER — LIDOCAINE 5 % EX PTCH
1.0000 | MEDICATED_PATCH | CUTANEOUS | Status: DC
  Administered 2013-01-26 – 2013-01-27 (×2): 1 via TRANSDERMAL
  Filled 2013-01-26 (×3): qty 1

## 2013-01-26 MED ORDER — FERROUS SULFATE 325 (65 FE) MG PO TABS
325.0000 mg | ORAL_TABLET | Freq: Every day | ORAL | Status: DC
  Administered 2013-01-26: 325 mg via ORAL
  Filled 2013-01-26 (×2): qty 1

## 2013-01-26 MED ORDER — IPRATROPIUM-ALBUTEROL 20-100 MCG/ACT IN AERS
1.0000 | INHALATION_SPRAY | Freq: Four times a day (QID) | RESPIRATORY_TRACT | Status: DC
  Administered 2013-01-26 (×2): 1 via RESPIRATORY_TRACT
  Filled 2013-01-26 (×2): qty 4

## 2013-01-26 MED ORDER — VANCOMYCIN HCL IN DEXTROSE 1-5 GM/200ML-% IV SOLN
1000.0000 mg | INTRAVENOUS | Status: DC
  Filled 2013-01-26: qty 200

## 2013-01-26 MED ORDER — SODIUM CHLORIDE 0.9 % IJ SOLN
3.0000 mL | Freq: Two times a day (BID) | INTRAMUSCULAR | Status: DC
  Administered 2013-01-26 – 2013-01-27 (×2): 3 mL via INTRAVENOUS

## 2013-01-26 MED ORDER — IPRATROPIUM-ALBUTEROL 20-100 MCG/ACT IN AERS
1.0000 | INHALATION_SPRAY | Freq: Three times a day (TID) | RESPIRATORY_TRACT | Status: DC
  Administered 2013-01-27 (×2): 1 via RESPIRATORY_TRACT
  Filled 2013-01-26: qty 4

## 2013-01-26 MED ORDER — SODIUM CHLORIDE 0.9 % IV SOLN
INTRAVENOUS | Status: AC
  Administered 2013-01-26: 06:00:00 via INTRAVENOUS

## 2013-01-26 MED ORDER — ATORVASTATIN CALCIUM 40 MG PO TABS
40.0000 mg | ORAL_TABLET | Freq: Every day | ORAL | Status: DC
  Administered 2013-01-26: 40 mg via ORAL
  Filled 2013-01-26 (×2): qty 1

## 2013-01-26 MED ORDER — OMEGA-3-ACID ETHYL ESTERS 1 G PO CAPS
1.0000 g | ORAL_CAPSULE | Freq: Two times a day (BID) | ORAL | Status: DC
  Administered 2013-01-26 – 2013-01-27 (×3): 1 g via ORAL
  Filled 2013-01-26 (×4): qty 1

## 2013-01-26 MED ORDER — INSULIN GLARGINE 100 UNIT/ML ~~LOC~~ SOLN
8.0000 [IU] | Freq: Every day | SUBCUTANEOUS | Status: DC
  Administered 2013-01-26: 8 [IU] via SUBCUTANEOUS
  Filled 2013-01-26 (×3): qty 0.08

## 2013-01-26 MED ORDER — WARFARIN - PHARMACIST DOSING INPATIENT: Freq: Every day | Status: DC

## 2013-01-26 MED ORDER — PIPERACILLIN-TAZOBACTAM IN DEX 2-0.25 GM/50ML IV SOLN
2.2500 g | Freq: Three times a day (TID) | INTRAVENOUS | Status: DC
  Administered 2013-01-26 – 2013-01-27 (×5): 2.25 g via INTRAVENOUS
  Filled 2013-01-26 (×8): qty 50

## 2013-01-26 MED ORDER — WARFARIN SODIUM 2.5 MG PO TABS
2.5000 mg | ORAL_TABLET | ORAL | Status: DC
  Filled 2013-01-26: qty 1

## 2013-01-26 MED ORDER — FISH OIL 1200 MG PO CAPS: 1200.0000 mg | ORAL_CAPSULE | Freq: Two times a day (BID) | ORAL | Status: DC

## 2013-01-26 MED ORDER — BUDESONIDE-FORMOTEROL FUMARATE 160-4.5 MCG/ACT IN AERO
2.0000 | INHALATION_SPRAY | Freq: Two times a day (BID) | RESPIRATORY_TRACT | Status: DC
  Administered 2013-01-26 – 2013-01-27 (×3): 2 via RESPIRATORY_TRACT
  Filled 2013-01-26 (×2): qty 6

## 2013-01-26 MED ORDER — VANCOMYCIN HCL IN DEXTROSE 1-5 GM/200ML-% IV SOLN
1000.0000 mg | Freq: Once | INTRAVENOUS | Status: AC
  Administered 2013-01-26: 1000 mg via INTRAVENOUS
  Filled 2013-01-26: qty 200

## 2013-01-26 MED ORDER — ACETAMINOPHEN 650 MG RE SUPP: 650.0000 mg | Freq: Four times a day (QID) | RECTAL | Status: DC | PRN

## 2013-01-26 MED ORDER — FAMOTIDINE 20 MG PO TABS
20.0000 mg | ORAL_TABLET | Freq: Every day | ORAL | Status: DC
  Administered 2013-01-26: 20 mg via ORAL
  Filled 2013-01-26 (×2): qty 1

## 2013-01-26 MED ORDER — HEPARIN SODIUM (PORCINE) 5000 UNIT/ML IJ SOLN: 5000.0000 [IU] | Freq: Three times a day (TID) | INTRAMUSCULAR | Status: DC

## 2013-01-26 MED ORDER — INSULIN ASPART 100 UNIT/ML ~~LOC~~ SOLN: 0.0000 [IU] | Freq: Every day | SUBCUTANEOUS | Status: DC

## 2013-01-26 MED ORDER — INSULIN ASPART 100 UNIT/ML ~~LOC~~ SOLN
0.0000 [IU] | Freq: Three times a day (TID) | SUBCUTANEOUS | Status: DC
  Administered 2013-01-26: 2 [IU] via SUBCUTANEOUS
  Administered 2013-01-26: 5 [IU] via SUBCUTANEOUS
  Administered 2013-01-26: 2 [IU] via SUBCUTANEOUS
  Administered 2013-01-27 (×2): 1 [IU] via SUBCUTANEOUS

## 2013-01-26 MED ORDER — ADULT MULTIVITAMIN W/MINERALS CH
1.0000 | ORAL_TABLET | Freq: Every day | ORAL | Status: DC
  Administered 2013-01-26: 1 via ORAL
  Filled 2013-01-26 (×3): qty 1

## 2013-01-26 MED ORDER — SODIUM CHLORIDE 0.45 % IV SOLN
INTRAVENOUS | Status: DC
  Administered 2013-01-26: 11:00:00 via INTRAVENOUS

## 2013-01-26 MED ORDER — ACETAMINOPHEN 325 MG PO TABS: 650.0000 mg | ORAL_TABLET | Freq: Four times a day (QID) | ORAL | Status: DC | PRN

## 2013-01-26 MED ORDER — ALBUTEROL SULFATE (5 MG/ML) 0.5% IN NEBU: 2.5000 mg | INHALATION_SOLUTION | RESPIRATORY_TRACT | Status: DC | PRN

## 2013-01-26 NOTE — Progress Notes (Signed)
I discussed with  Dr Marsh.  I agree with their plans documented in their progress note for today.     

## 2013-01-26 NOTE — ED Notes (Signed)
unablt to give so;u-cortef at present none in the dept. Pharmacy will send

## 2013-01-26 NOTE — Progress Notes (Signed)
UR completed.  Shakima Nisley, RN BSN MHA CCM Trauma/Neuro ICU Case Manager [phone]  

## 2013-01-26 NOTE — H&P (Signed)
Seen and examined.  Discussed with Dr. Berline Chough.  Agree with his documentation and management.  Briefly, 79 yo female admitted with hypotension and urinary track infection.  Today "I feel 100% better.  Can I go home?" Issues: 1. Urosepsis.  Following Ockham's razor, urosepsis explains her symptoms.  She clearly has a urinary track infection.  She did not appear to be significantly dehydrated.  She seems to be responding to antibiotic and fluid support.  We have not completely nailed down this clinical scenario, but urosepsis is best fit. 2. Chronic cough, developing infiltrate on CXR.  She has a chronic cough from her years of smoking.  She is not certain if there has been any recent change.  She does have developing infiltrates on CXR.  I do not feel we need to add any atypical coverage at this point.  Revisit that decision tomorrow and add azithro or resp quinolone if CAP dx seems more solid. 3. Elderly and frail.  Will get PT eval for safety.  Current plan is DC to home.  May need some PT to help her cope at home.  Begin dispo planning now.  If improvement continues, she could be DCed as early as 12/11.

## 2013-01-26 NOTE — ED Notes (Signed)
The pt is in for a stepdown bed but there are none tonight.  Holding in the ed

## 2013-01-26 NOTE — Progress Notes (Signed)
Family Medicine Teaching Service Daily Progress Note Intern Pager: [phone]  Patient name: Suzanne [name] Medical record number: [id number] Date of birth: 1921-06-02 Age: 79 y.o. Gender: female  Primary Care Provider: Lucilla Edin, MD Consultants: none Code Status: DNR/DNI  Pt Overview and Major Events to Date:   Assessment and Plan: Suzanne [name] is a 79 y.o. female presenting with a week of worsening fever, chills, and dysuria. PMH is significant for CHF, COPD, DM, RAS, CKD, CAD, DVT, dementia.   # SIRS with unknown etiology: Hypotensive with general malaise on presentation, UA with moderate LE (nitrite negative), moderate hgb, 11-20 WBC, 7-10 RBC, few bacteria, rigors on initial presentation. Also with findings of LLL infiltrated on repeat CXR. Also with tenderness and erythema over non healing heel fissure (present for approx 3 months?) Overall appears clinically improved this morning. Denies dysuria, SOB, chest pain palps. However at increased risk of systemic infection given hx of multiple grafts and DM - s/p CTX in ED, vanc/zosyn ordered for broad coverage pending normotension and cultures  - UCx and BCx pending, de-escalate pending cultures - close vs monitoring for instability -consider further imaging of heel for possible osteo?  # Hypotension; Hx of difficult to control HTN: In setting of known UTI and rigors, likely sepsis. Lactate and procalcitonin reassuring. Systolics 120s-140s this am - s/p 2L bolus in ED  - antibiotics as above  - monitor on tele  - Stress dose steroids, cortisol level drawn prior no recent high-dose steroid use but does have remote history, pending  # Diastolic CHF with Hx of mild CAD by cath (10/12/12): Grade 1 diastolic with EF of 55-60% (ECHO Dec,31 2012). Heart sounds difficult to auscultate on exam  - continue to monitor -gentle hydration, back off to 1/2NS@50ml /hr  # COPD: History of 2.5 L oxygen dependence at home at rest. No recent steroid  use. Worsening productive cough.  -cont home medications   # DM: on 16u lantus at home, last A1C 5.9  - 8u lantus while inpatient + sensitive SSI   # DVT: Currently anticoagulated, therapeutic on warfarin  - continue warfarin, dosing per pharm   # Heel ulcer: chronic (X3 months) and pt with evidence of extensive vasc disease - wound care following - Poor capillary refill, consider additional imaging  #PVD (vasculopath): Multiple prior procedures including femoropopliteal bypass, AAA repair, with known renal artery stenosis bilaterally.  - Given multiple bypass grafts obviously if any objective evidence of bacteremia needs to be more closely evaluated.   #Anemia: hgb noted to be 9.3 this morning, may be dilutional since all cell lines dropped and pt was aggressively fluid resus yesterday -will trend with repeat CBCs  FEN/GI: carb mod diet, 1/2NS @50  Prophylaxis: SQ heparin  Disposition: narrowing of abx coverage, improvement of BP  Subjective: Feels much improved this morning, no dysuria/hematuria, no difficulty breathing  Objective: Temp:  [97.7 F (36.5 C)-98.2 F (36.8 C)] 97.7 F (36.5 C) (12/10 0600) Pulse Rate:  [70-83] 73 (12/10 0600) Resp:  [14-20] 19 (12/10 0600) BP: (90-138)/(43-64) 133/48 mmHg (12/10 0600) SpO2:  [95 %-99 %] 98 % (12/10 0600) Weight:  [142 lb 8 oz (64.638 kg)-145 lb 8.1 oz (66 kg)] 145 lb 8.1 oz (66 kg) (12/10 0500) Physical Exam: General: lying in bed, NAD  HEENT: MMM,  Cardiovascular: distant heart sounds, dp difficult to palpate Respiratory: bibasilar crackles, coarse breath sounds L>R, normal WOB, baseline oxygen supplementation  Abdomen: No CVA tenderness or suprapubic tenderness, nd Extremities:poor perfusion bilaterally  L>R, extremely sensitive to touch Skin: 1cm ulceration on inferior left heel, no erythema, collagen patch in place, well healed 2cm ulceration on inferior surface of left great toe  Neuro: alert, grossly nonfocal, baseline  short-term memory loss  Laboratory:  Recent Labs Lab 01/25/13 1714 01/25/13 1915 01/26/13 0430  WBC 11.4* 10.9* 8.8  HGB 10.4* 10.2* 9.3*  HCT 34.8* 31.6* 28.9*  PLT  --  151 143*    Recent Labs Lab 01/25/13 1915 01/26/13 0430  NA 136 136  K 3.6 3.9  CL 100 104  CO2 22 23  BUN 50* 46*  CREATININE 2.22* 2.10*  CALCIUM 8.7 8.0*  PROT 6.4  --   BILITOT 0.4  --   ALKPHOS 48  --   ALT 20  --   AST 26  --   GLUCOSE 207* 225*     01/25/2013 20:54   Color, Urine  YELLOW   APPearance  CLOUDY (A)   Specific Gravity  1.008   pH  5.5   Glucose  NEGATIVE   Bilirubin  NEGATIVE   Ketones  NEGATIVE   Protein  NEGATIVE   Urobilinogen  0.2   Nitrite  NEGATIVE   Leukocytes  MODERATE (A)   Hgb  MODERATE (A)   WBC  11-20   RBC / HPF  7-10   Squamous Epithelial / LPF  RARE   Bacteria, UA  FEW (A)   Lactic acid 0.92  INR 2.59  ProBNP 1207  Procalcitonin 0.11   Imaging/Diagnostic Tests: CXR 01/25/13 1. Question of mild focal medial right basilar airspace opacity,  possibly reflecting pneumonia.  2. Findings of COPD; mild cardiomegaly.   Suzanne Lis, MD 01/26/2013, 7:28 AM PGY-1, [facility] Family Medicine FPTS Intern pager: [phone], text pages welcome

## 2013-01-26 NOTE — ED Notes (Signed)
The pt has been up to the bedside commode

## 2013-01-26 NOTE — ED Notes (Signed)
Report called to [phone] 

## 2013-01-26 NOTE — ED Notes (Signed)
Pt to xray

## 2013-01-26 NOTE — ED Notes (Signed)
Family notified that the pt would not get as bed until sometime  today

## 2013-01-26 NOTE — ED Notes (Signed)
The pt returned from xray 

## 2013-01-26 NOTE — Progress Notes (Signed)
Inpatient Diabetes Program Recommendations  AACE/ADA: New Consensus Statement on Inpatient Glycemic Control (2013)  Target Ranges:  Prepandial:   less than 140 mg/dL      Peak postprandial:   less than 180 mg/dL (1-2 hours)      Critically ill patients:  140 - 180 mg/dL  Results for NYREE, APPLEGATE (MRN [id number]) as of 01/26/2013 12:14  Ref. Range 01/26/2013 05:07 01/26/2013 07:56 01/26/2013 11:37  Glucose-Capillary Latest Range: 70-99 mg/dL 956 (H) 213 (H) 086 (H)   Add carbohydrate modified to current heart healthy diet.   Thank you  Piedad Climes BSN, RN,CDE Inpatient Diabetes Coordinator [phone] (team pager)

## 2013-01-26 NOTE — Consult Note (Signed)
WOC wound consult note Reason for Consult: evaluation of LLE heel ulcer. Reported treatment per Dr. Jimmey Ralph at the [facility] x 3 months.  The patient reported her heel "cracked open" and it would not heal.  She reports her daughter packs this wound and she is not able to recall the type of dressing used at this time. Wound type: non healing heel fissure, per patient report.  Does not appear to be pressure related. Pressure Ulcer POA: No Measurement: 0.2 cm x 0.2cm x 0.2 (or less), WOC had a difficult time even seeing the site.  Wound WUJ:WJXB opening in the medial left heel, very TTP and the foot and heel are considerably more erythematous than the other heel.  Drainage (amount, consistency, odor)   none Periwound: heel is extremely painful.  She reports restless leg syndrome and she does appear to rub her heel on the bed  Dressing procedure/placement/frequency: will order non adherent, however she does indicate her daughter will "repack it" I am not sure once the daughter is in she may choose to do some other type of dressing. At this time I do not see the benefit in packing this wound as it is so shallow.   Offered to order Prevalon boot for offloading while in bed, to ease the discomfort she has refused this.    I am a bit concerned in the amount of tenderness and erthyma in the heel. I can not locate any recent imaging on the heel in the EMR, may be beneficial to rule out any further issues in the heel.   Discussed POC with patient and bedside nurse.  Re consult if needed, will not follow at this time. Thanks  Chance Munter [employer], CWOCN [phone])

## 2013-01-26 NOTE — Progress Notes (Addendum)
ANTIBIOTIC CONSULT NOTE - INITIAL  Pharmacy Consult for Vancomycin/Zosyn Indication: rule out pneumonia, possible urosepsis  Pharmacy Consult for Warfarin Indication: hx DVT  Allergies  Allergen Reactions  . Codeine Other (See Comments)    hallucinations  . Hydrochlorothiazide Other (See Comments)    hypotension  . Other     Adhesives and bandages gives pt a rash.  . Pregabalin Other (See Comments)    unknown    Patient Measurements: 64.6 kg  Vital Signs: Temp: 98.2 F (36.8 C) (12/10 0006) Temp src: Oral (12/09 1631) BP: 114/50 mmHg (12/10 0024) Pulse Rate: 72 (12/10 0024) Intake/Output from previous day: 12/09 0701 - 12/10 0700 In: 1000 [I.V.:1000] Out: -  Intake/Output from this shift: Total I/O In: 1000 [I.V.:1000] Out: -   Labs:  Recent Labs  01/25/13 1714 01/25/13 1915  WBC 11.4* 10.9*  HGB 10.4* 10.2*  PLT  --  151  CREATININE  --  2.22*   Medical History: Past Medical History  Diagnosis Date  . Type II or unspecified type diabetes mellitus without mention of complication, not stated as uncontrolled   . PVD (peripheral vascular disease)     PTA & stent right superficial artery PTA & left common iliac artery stenting. Right fem-pop bypass graft  . Mixed hyperlipidemia   . Renal artery stenosis   . Compression fx, thoracic spine     T12  . Ischium fracture   . Memory loss, short term   . Pulmonary nodule   . CAD (coronary artery disease) 10/12/2008    :30% LAD,30% CX & 40% tandem mid stenosis in the AV groove, 50-60% RCA  . CHF (congestive heart failure) 09-2008  . Anemia   . COPD (chronic obstructive pulmonary disease)   . Pneumonia   . Detached retina 02-2005    R EYE  . Fracture 02-2007    L5 AND PELVIS  . COPD (chronic obstructive pulmonary disease) 03/18/2011  . Left ventricular dysfunction     Moderate by cath,EF approx. 40% with mod. global hypokinesis  . AAA (abdominal aortic aneurysm)     Repair unknown date    Assessment: 79  y/o F here with fever, chills, dysuria. WBC 10.8, Scr 2.22, other labs as above.   Also on warfarin PTA for DVT.  PTA Dose: 1.25mg  MWF, 2.5mg  TTSS INR 2.59 on admit  Goal of Therapy:  Vancomycin trough level 15-20 mcg/ml  Plan:  -Vancomycin 1000 mg IV q48h -Zosyn 2.25g IV q8h -Trend WBC, temp, renal function  -F/U any cultures, imaging   -Warfarin per home regimen as above -Daily PT/INR -Adjust dose as needed -Monitor for bleeding  Thank you for allowing me to take part in this patient's care,  Abran Duke, PharmD Clinical Pharmacist Phone: [phone] Pager: [phone] 01/26/2013 12:38 AM

## 2013-01-26 NOTE — ED Notes (Signed)
No bed assigned yet.   Vancomycin hung.  Iv fluid slowed to kvo.  The pt has noisy breathing that the daughter at bedside reports is normal.  Much lung disease

## 2013-01-27 LAB — URINE CULTURE

## 2013-01-27 LAB — GLUCOSE, CAPILLARY: Glucose-Capillary: 128 mg/dL — ABNORMAL HIGH (ref 70–99)

## 2013-01-27 LAB — BASIC METABOLIC PANEL
BUN: 35 mg/dL — ABNORMAL HIGH (ref 6–23)
CO2: 23 mEq/L (ref 19–32)
Calcium: 8.7 mg/dL (ref 8.4–10.5)
Chloride: 107 mEq/L (ref 96–112)
Creatinine, Ser: 1.84 mg/dL — ABNORMAL HIGH (ref 0.50–1.10)
Glucose, Bld: 242 mg/dL — ABNORMAL HIGH (ref 70–99)
Potassium: 3.7 mEq/L (ref 3.5–5.1)

## 2013-01-27 LAB — CBC WITH DIFFERENTIAL/PLATELET
Basophils Absolute: 0 10*3/uL (ref 0.0–0.1)
Basophils Relative: 1 % (ref 0–1)
Eosinophils Absolute: 0.2 10*3/uL (ref 0.0–0.7)
Eosinophils Relative: 4 % (ref 0–5)
HCT: 29.8 % — ABNORMAL LOW (ref 36.0–46.0)
Hemoglobin: 9.6 g/dL — ABNORMAL LOW (ref 12.0–15.0)
MCH: 31.4 pg (ref 26.0–34.0)
MCHC: 32.2 g/dL (ref 30.0–36.0)
MCV: 97.4 fL (ref 78.0–100.0)
Monocytes Relative: 10 % (ref 3–12)
Neutro Abs: 3.5 10*3/uL (ref 1.7–7.7)
RDW: 13.1 % (ref 11.5–15.5)

## 2013-01-27 LAB — PROTIME-INR
INR: 2.14 — ABNORMAL HIGH (ref 0.00–1.49)
Prothrombin Time: 23.2 seconds — ABNORMAL HIGH (ref 11.6–15.2)

## 2013-01-27 MED ORDER — LEVOFLOXACIN 750 MG PO TABS: 750.0000 mg | ORAL_TABLET | Freq: Every day | ORAL | Status: DC

## 2013-01-27 MED ORDER — SENNA 8.6 MG PO TABS
1.0000 | ORAL_TABLET | Freq: Every day | ORAL | Status: DC
  Administered 2013-01-27: 8.6 mg via ORAL
  Filled 2013-01-27: qty 1

## 2013-01-27 MED ORDER — FUROSEMIDE 20 MG PO TABS
20.0000 mg | ORAL_TABLET | Freq: Every day | ORAL | Status: DC
  Administered 2013-01-27: 20 mg via ORAL
  Filled 2013-01-27: qty 1

## 2013-01-27 NOTE — Progress Notes (Signed)
Discharged to home with family office visits in place teaching done  

## 2013-01-27 NOTE — Progress Notes (Signed)
ANTICOAGULATION CONSULT NOTE - Follow Up Consult  Pharmacy Consult for Warfarin Indication: DVT  Allergies  Allergen Reactions  . Codeine Other (See Comments)    hallucinations  . Hydrochlorothiazide Other (See Comments)    hypotension  . Other     Adhesives and bandages gives pt a rash.  . Pregabalin Other (See Comments)    unknown    Patient Measurements: Height: 5\' 2"  (157.5 cm) Weight: 147 lb 14.4 oz (67.087 kg) IBW/kg (Calculated) : 50.1  Vital Signs: Temp: 98.3 F (36.8 C) (12/11 0427) Temp src: Oral (12/11 0427) BP: 118/61 mmHg (12/11 0526) Pulse Rate: 77 (12/11 0427)  Labs:  Recent Labs  01/25/13 1717  01/25/13 1915 01/25/13 2153 01/26/13 0430 01/27/13 0340 01/27/13 0835  HGB  --   < > 10.2*  --  9.3*  --  9.6*  HCT  --   --  31.6*  --  28.9*  --  29.8*  PLT  --   --  151  --  143*  --  147*  LABPROT 26.8*  --   --  26.9*  --  23.2*  --   INR 2.60*  --   --  2.59*  --  2.14*  --   CREATININE  --   --  2.22*  --  2.10*  --  1.84*  < > = values in this interval not displayed.  Estimated Creatinine Clearance: 18.6 ml/min (by C-G formula based on Cr of 1.84).   Medications:  PTA Warfarin: 1.25mg  on Mon/Wed/Fri, 2.5mg  Tues/Thurs/Sat/Sun  Assessment: 79 year old female admitted with possible urosepsis.  She is on chronic anticoagulation with Coumadin for history of DVT.  Her INR remains therapeutic, but with a decrease today.  It appears she likely missed a dose of Coumadin on 12/9 when she was being admitted which is likely why her INR decreased today.  Close monitoring continues due to acute illness and potential interactions with antibiotic therapy.  Goal of Therapy:  INR 2-3   Plan:  Coumadin 2.5mg  today - her usual Thursday dose. Daily PT/INR monitoring.  Estella Husk, Pharm.D., BCPS, AAHIVP Clinical Pharmacist Phone: [phone] or [phone] 01/27/2013, 10:09 AM

## 2013-01-27 NOTE — Progress Notes (Signed)
I have seen and examined this patient. I have discussed with Dr Marsh.  I agree with their findings and plans as documented in their progress note.    

## 2013-01-27 NOTE — Discharge Summary (Signed)
Family Medicine Teaching [facility] Discharge Summary  Patient name: Suzanne [name] Medical record number: [id number] Date of birth: 05/02/1921 Age: 79 y.o. Gender: female Date of Admission: 01/25/2013  Date of Discharge: 01/27/13 Admitting Physician: Leighton Roach McDiarmid, MD  Primary Care Provider: Lucilla Edin, MD Consultants: None  Indication for Hospitalization: SIRS, dysuria, SOB  Discharge Diagnoses/Problem List:  UTI HTN Diastolic CHF/hx of mild CAD by cath COPD DMII DVT Heel Ulcer PVD Anemia Constipation  Disposition: to home  Discharge Condition: improved  Discharge Exam:  BP 118/61  Pulse 92  Temp(Src) 97.4 F (36.3 C) (Oral)  Resp 20  Ht 5\' 2"  (1.575 m)  Wt 147 lb 14.4 oz (67.087 kg)  BMI 27.04 kg/m2  SpO2 90% General: lying in bed, NAD  HEENT: MMM, EOMI Cardiovascular: distant heart sounds, dp difficult to palpate  Respiratory: bibasilar crackles, coarse breath sounds, diffuse end expiratory wheeze  Abdomen: No CVA tenderness or suprapubic tenderness, nd  Extremities:poor perfusion bilaterally L>R, extremely sensitive to touch  Skin: 1cm ulceration on inferior left heel, no erythema, wrapped in gauze, well healed 2cm ulceration on inferior surface of left great toe  Neuro: alert, grossly nonfocal, baseline short-term memory loss  Brief Hospital Course: Suzanne [name] is a 79 y.o. female presenting with a week of worsening fever, chills, and dysuria. PMH is significant for CHF, COPD, DM, RAS, CKD, CAD, DVT, dementia.  # SIRS with unknown etiology: Hypotensive with general malaise on presentation, UA with moderate LE (nitrite negative), moderate hgb, 11-20 WBC, 7-10 RBC, few bacteria, rigors on initial presentation. Also with findings of LLL infiltrated on repeat CXR. Also with tenderness and erythema over non healing heel fissure (present for approx 3 months?). Bcx, UCx Sputum Cx, urine legionella, strep pneumo urine antigen obtained. S/p CTX in ED.  Vanc/zosyn ordered to cover MRSA and pseudomonas. Pt with great improvement after 24 hrs. No dysuria, breathing on baseline o2 requirement. Resp culture mod gram post cocci in clusters. Bcx neg X24hrs. Ucx with Ecoli. Legionella/pneumo neg. Pt was transitioned to PO levaquin for coverage of both PNA and UTI for an additional 5 more days. Patient up and ambulating with PT. Rollator ordered for home use. Pt to f/up with INR check on Monday 12/15 given new abx use.  # Hypotension; Hx of difficult to control HTN: In setting of known UTI and rigors, likely sepsis. Metoprolol held. Lactate and procalcitonin reassuring. Pt initially given stress dose steroids and 2L bolus in the ED with good response. Cortisol 10.8. Steroids were d/c'd. Was started on antibiotics as above. Systolics 110s-120s. Was restarted on metoprolol on d/c. # Diastolic CHF with Hx of mild CAD by cath (10/12/12): Grade 1 diastolic with EF of 55-60% (ECHO Dec,31 2012). Heart sounds difficult to auscultate on exam. I/os were trended. Lasix held initially given hypotension however was added back as pt BP tolerated. Stable on d/c.  # COPD: History of 2.5 L oxygen dependence at home at rest. No recent steroid use.Home medications were continued. Pt on home o2 requirement on d/c. PNA was treated as above.   # DM: On 16u lantus at home, last A1C 5.9, CBGs 100s-200, in house on only 8u, would recommend less tight control in elderly woman with increased risk for adverse events with hypoglycemia.  # DVT: Currently anticoagulated, therapeutic on warfarin. No evidence of thrombotic event. # Heel ulcer: Chronic (X3 months) and pt with evidence of extensive vasc disease. Very tender throughout bilat lower extremities (baseline). Wound care was  consulted for management. Wound shallow, no drainage no purulence. Collagen patch replaced. Imaging not obtained as not felt to be acutely infected. Pt encouraged to f/up with outpt office. #PVD (vasculopath): Multiple  prior procedures including femoropopliteal bypass, AAA repair, with known renal artery stenosis bilaterally. Given multiple bypass grafts pt was closely monitored for evidence of bacteremia. #Anemia: Hgb noted to be 10.4 on admission.Appears baseline closer to 11-12. May have component of dilutional drop as pt was given aggressive fluid resuscitation on admission. CBCs trended, was stable at 9.6 on d/c. Would f/up with PCP on d/c for iron studies.  #constipation: Pt was attesting to recent hx of constipation and straining with BM. Senna was started with BM and relief. Pt encouraged to increase fiber and water in diet.  Issues for Follow Up:  1. INR check Monday 2. Completion of Abx tx and symptomatic improvement 3. Decrease lantus dose  Significant Procedures: None  Significant Labs and Imaging:   Recent Labs Lab 01/25/13 1915 01/26/13 0430 01/27/13 0835  WBC 10.9* 8.8 6.3  HGB 10.2* 9.3* 9.6*  HCT 31.6* 28.9* 29.8*  PLT 151 143* 147*    Recent Labs Lab 01/25/13 1915 01/26/13 0430 01/27/13 0835  NA 136 136 143  K 3.6 3.9 3.7  CL 100 104 107  CO2 22 23 23   GLUCOSE 207* 225* 242*  BUN 50* 46* 35*  CREATININE 2.22* 2.10* 1.84*  CALCIUM 8.7 8.0* 8.7  ALKPHOS 48  --   --   AST 26  --   --   ALT 20  --   --   ALBUMIN 3.0*  --   --      01/25/2013 20:54   Color, Urine  YELLOW   APPearance  CLOUDY (A)   Specific Gravity  1.008   pH  5.5   Glucose  NEGATIVE   Bilirubin  NEGATIVE   Ketones  NEGATIVE   Protein  NEGATIVE   Urobilinogen  0.2   Nitrite  NEGATIVE   Leukocytes  MODERATE (A)   Hgb  MODERATE (A)   WBC  11-20   RBC / HPF  7-10   Squamous Epithelial / LPF  RARE   Bacteria, UA  FEW (A)   Lactic acid 0.92  INR 2.59  ProBNP 1207  Procalcitonin 0.11  UCx- Ecoli >100,000  Imaging/Diagnostic Tests:  CXR 01/25/13  1. Question of mild focal medial right basilar airspace opacity,  possibly reflecting pneumonia.  2. Findings of COPD; mild  cardiomegaly.   Results/Tests Pending at Time of Discharge: None  Discharge Medications:    Medication List         acetaminophen 500 MG tablet  Commonly known as:  TYLENOL  Take 500 mg by mouth every 4 (four) hours as needed (pain).     albuterol (2.5 MG/3ML) 0.083% nebulizer solution  Commonly known as:  PROVENTIL  Take 2.5 mg by nebulization See admin instructions. Use twice daily and a third time as needed for shortness of breath or wheezing     Blood Glucose Meter kit  Use as instructed     budesonide-formoterol 160-4.5 MCG/ACT inhaler  Commonly known as:  SYMBICORT  Inhale 2 puffs into the lungs 2 (two) times daily.     CALCIUM 600 + D PO  Take 600 mg by mouth at bedtime.     COMBIVENT RESPIMAT 20-100 MCG/ACT Aers respimat  Generic drug:  Ipratropium-Albuterol  Inhale 2 puffs into the lungs every 6 (six) hours.     esomeprazole  40 MG capsule  Commonly known as:  NEXIUM  Take 1 capsule (40 mg total) by mouth daily before breakfast.     ferrous sulfate 325 (65 FE) MG tablet  Take 325 mg by mouth at bedtime.     Fish Oil 1200 MG Caps  Take 1,200 mg by mouth 2 (two) times daily.     furosemide 40 MG tablet  Commonly known as:  LASIX  Take 1 tablet (40 mg total) by mouth daily.     glucose blood test strip  Use as instructed     ibandronate 150 MG tablet  Commonly known as:  BONIVA  Take 150 mg by mouth every 30 (thirty) days. Take in the morning with a full glass of water, on an empty stomach, and do not take anything else by mouth or lie down for the next 30 min. (take on the 25th of each month)     Insulin Pen Needle 31G X 5 MM Misc  16 Units by Does not apply route daily.     Insulin Syringe-Needle U-100 30G X 1/2" 1 ML Misc  20 Units by Does not apply route daily.     LANTUS SOLOSTAR 100 UNIT/ML Sopn  Generic drug:  Insulin Glargine  Inject 16 Units into the skin at bedtime.     levofloxacin 750 MG tablet  Commonly known as:  LEVAQUIN  Take 1  tablet (750 mg total) by mouth daily.     lidocaine 5 %  Commonly known as:  LIDODERM  Place 1 patch onto the skin daily. Remove & Discard patch within 12 hours or as directed by MD     losartan 100 MG tablet  Commonly known as:  COZAAR  Take 1 tablet (100 mg total) by mouth daily.     metoprolol succinate 25 MG 24 hr tablet  Commonly known as:  TOPROL-XL  Take 25 mg by mouth at bedtime.     multivitamin with minerals Tabs tablet  Take 1 tablet by mouth at bedtime.     onetouch ultrasoft lancets  Insulin dependant diabetes patient checks blood sugars tid     PRESCRIPTION MEDICATION  Apply 1 application topically every other day. Collagen mesh patch for wound care on heel of left foot     ranitidine 150 MG tablet  Commonly known as:  ZANTAC  Take 1 tablet (150 mg total) by mouth 2 (two) times daily.     rosuvastatin 20 MG tablet  Commonly known as:  CRESTOR  Take 20 mg by mouth daily.     warfarin 2.5 MG tablet  Commonly known as:  COUMADIN  Take 1.25-2.5 mg by mouth daily at 6 PM. Take 1/2 tablet (1.25 mg) on Monday, Wednesday and Friday), take 1 tablet (2.5 mg) on Tuesday, Thursday, Saturday, Sunday     WEBCOL ALCOHOL PREP MEDIUM 70 % Pads  1 application by Does not apply route 1 day or 1 dose.        Discharge Instructions: Please refer to Patient Instructions section of EMR for full details.  Patient was counseled important signs and symptoms that should prompt return to medical care, changes in medications, dietary instructions, activity restrictions, and follow up appointments.   Follow-Up Appointments:     Follow-up Information   Schedule an appointment as soon as possible for a visit with DAUB, STEVE A, MD. (in next 1-2 weeks for f/up)    Specialty:  Family Medicine   Contact information:   [address] [place]  [place] [address] [phone]       Anselm Lis, MD 01/27/2013, 4:05 PM PGY-1, [facility] Family Medicine

## 2013-01-27 NOTE — Evaluation (Signed)
Physical Therapy Evaluation Patient Details Name: Suzanne [name] MRN: [id number] DOB: [birth year] Today's Date: 01/27/2013 Time: 0865-7846 PT Time Calculation (min): 32 min  PT Assessment / Plan / Recommendation History of Present Illness  EARLY ORD is a 79 y.o. female presenting with a week of worsening fever, chills, and dysuria. PMH is significant for CHF, COPD, DM, RAS, CKD, CAD, DVT, dementia. Pt with SIRS.  Clinical Impression  Pt at baseline for mobility.  Can benefit from rollator at home to allow pt to incr activity. Ready for dc from PT standpoint.    PT Assessment  Patent does not need any further PT services    Follow Up Recommendations  No PT follow up    Does the patient have the potential to tolerate intense rehabilitation      Barriers to Discharge        Equipment Recommendations  Other (comment) (rollator)    Recommendations for Other Services     Frequency      Precautions / Restrictions Precautions Precautions: Fall Restrictions Weight Bearing Restrictions: No   Pertinent Vitals/Pain Dyspnea 3/4 with amb without O2(pt reports amb household without it at home)      Mobility  Transfers Transfers: Sit to Stand;Stand to Sit Sit to Stand: 6: Modified independent (Device/Increase time);With upper extremity assist;From bed Stand to Sit: 6: Modified independent (Device/Increase time);With upper extremity assist;To bed Ambulation/Gait Ambulation/Gait Assistance: 6: Modified independent (Device/Increase time) Ambulation Distance (Feet): 100 Feet Assistive device: 4-wheeled walker;Straight cane Ambulation/Gait Assistance Details: With cane pt required standing rest break propped on the wall. With rollator pt able to sit and take rest break. Verbal cues to stand more upright. Gait Pattern: Step-through pattern;Decreased stride length;Trunk flexed Gait velocity: decr    Exercises     PT Diagnosis:    PT Problem List:   PT Treatment Interventions:        PT Goals(Current goals can be found in the care plan section) Acute Rehab PT Goals PT Goal Formulation: No goals set, d/c therapy  Visit Information  Last PT Received On: 01/27/13 Assistance Needed: +1 History of Present Illness: Suzanne [name] is a 79 y.o. female presenting with a week of worsening fever, chills, and dysuria. PMH is significant for CHF, COPD, DM, RAS, CKD, CAD, DVT, dementia. Pt with SIRS.       Prior Functioning  Home Living Family/patient expects to be discharged to:: Private residence Living Arrangements: Alone Available Help at Discharge: Family;Available PRN/intermittently Type of Home: Apartment Home Access: Stairs to enter Entrance Stairs-Number of Steps: 1 Entrance Stairs-Rails: None Home Layout: One level Home Equipment: Walker - standard;Cane - single point;Wheelchair - manual Prior Function Level of Independence: Independent with assistive device(s) Comments: Pt uses cane when out.  If going very long distances daughter takes her in w/c.    Cognition  Cognition Arousal/Alertness: Awake/alert Behavior During Therapy: WFL for tasks assessed/performed Overall Cognitive Status: History of cognitive impairments - at baseline    Extremity/Trunk Assessment Upper Extremity Assessment Upper Extremity Assessment: Overall WFL for tasks assessed Lower Extremity Assessment Lower Extremity Assessment: Overall WFL for tasks assessed   Balance Balance Balance Assessed: Yes Static Standing Balance Static Standing - Balance Support: Left upper extremity supported Static Standing - Level of Assistance: 6: Modified independent (Device/Increase time)  End of Session PT - End of Session Activity Tolerance: Patient tolerated treatment well Patient left: in bed;with call bell/phone within reach;with nursing/sitter in room Nurse Communication: Mobility status  GP  Harry Bark 01/27/2013, 10:46 AM  Skip Mayer PT [phone]

## 2013-01-27 NOTE — Progress Notes (Signed)
Pt had 5 beat run of Vtach. Pt asymptomatic. VSS.  Will continue to monitor closely.  Clovis Fredrickson A

## 2013-01-27 NOTE — Progress Notes (Signed)
Family Medicine Teaching Service Daily Progress Note Intern Pager: [phone]  Patient name: ARLONE LENHARDT Medical record number: [id number] Date of birth: September 20, 1921 Age: 79 y.o. Gender: female  Primary Care Provider: Lucilla Edin, MD Consultants: none Code Status: DNR/DNI  Pt Overview and Major Events to Date:   Assessment and Plan: LEONTINE RADMAN is a 79 y.o. female presenting with a week of worsening fever, chills, and dysuria. PMH is significant for CHF, COPD, DM, RAS, CKD, CAD, DVT, dementia.   # SIRS with unknown etiology: Hypotensive with general malaise on presentation, UA with moderate LE (nitrite negative), moderate hgb, 11-20 WBC, 7-10 RBC, few bacteria, rigors on initial presentation. Also with findings of LLL infiltrated on repeat CXR. Also with tenderness and erythema over non healing heel fissure (present for approx 3 months?). Resp mod gram post cocci - s/p CTX in ED, vanc/zosyn ordered for broad coverage, Bcx pending (neg X24hrs) - UCx with Ecoli - close vs monitoring for instability -consider further imaging of heel for possible infectious etiology should bcx show gram+  # Hypotension; Hx of difficult to control HTN: In setting of known UTI and rigors, likely sepsis. Lactate and procalcitonin reassuring. Systolics 110s-150s this am. Cortisol 10.8 - s/p 2L bolus in ED  - antibiotics as above  - monitor on tele   # Diastolic CHF with Hx of mild CAD by cath (10/12/12): Grade 1 diastolic with EF of 55-60% (ECHO Dec,31 2012). Heart sounds difficult to auscultate on exam Net post 755cc yesterday - continue to monitor - trend i/o -add 20 PO lasix today  # COPD: History of 2.5 L oxygen dependence at home at rest. No recent steroid use. Worsening productive cough.  -cont home medications   # DM: on 16u lantus at home, last A1C 5.9, CBGs 100s-200, with isolated 270 - 8u lantus while inpatient + sensitive SSI   # DVT: Currently anticoagulated, therapeutic on warfarin  -  continue warfarin, dosing per pharm   # Heel ulcer: chronic (X3 months) and pt with evidence of extensive vasc disease - wound care following, ideally would replace collagen patch - Poor capillary refill, consider additional imaging  #PVD (vasculopath): Multiple prior procedures including femoropopliteal bypass, AAA repair, with known renal artery stenosis bilaterally.  - Given multiple bypass grafts obviously if any objective evidence of bacteremia needs to be more closely evaluated.   #Anemia: hgb noted to be 9.3 yesterday, may be dilutional since all cell lines dropped and pt was aggressively fluid resus'd -will trend with repeat CBCs  #constipation: Has not had a BM since Monday, which was very hard -starting senna  FEN/GI: carb mod diet Prophylaxis: SQ heparin  Disposition: narrowing of abx coverage, improvement of BP  Subjective: Feels ok this morning, concerned that she has not been getting all her breathing treatments as she does at home  Objective: Temp:  [97 F (36.1 C)-98.3 F (36.8 C)] 98.3 F (36.8 C) (12/11 0427) Pulse Rate:  [74-92] 77 (12/11 0427) Resp:  [14-20] 18 (12/11 0427) BP: (99-169)/(37-69) 118/61 mmHg (12/11 0526) SpO2:  [93 %-100 %] 95 % (12/11 0427) Weight:  [147 lb 14.4 oz (67.087 kg)] 147 lb 14.4 oz (67.087 kg) (12/11 9629) Physical Exam: General: lying in bed, NAD  HEENT: MMM,  Cardiovascular: distant heart sounds, dp difficult to palpate Respiratory: bibasilar crackles, coarse breath sounds, diffuse end expiratory wheeze Abdomen: No CVA tenderness or suprapubic tenderness, nd Extremities:poor perfusion bilaterally L>R, extremely sensitive to touch Skin: 1cm ulceration on inferior left  heel, no erythema, wrapped in gauze, well healed 2cm ulceration on inferior surface of left great toe  Neuro: alert, grossly nonfocal, baseline short-term memory loss  Laboratory:  Recent Labs Lab 01/25/13 1714 01/25/13 1915 01/26/13 0430  WBC 11.4* 10.9*  8.8  HGB 10.4* 10.2* 9.3*  HCT 34.8* 31.6* 28.9*  PLT  --  151 143*    Recent Labs Lab 01/25/13 1915 01/26/13 0430  NA 136 136  K 3.6 3.9  CL 100 104  CO2 22 23  BUN 50* 46*  CREATININE 2.22* 2.10*  CALCIUM 8.7 8.0*  PROT 6.4  --   BILITOT 0.4  --   ALKPHOS 48  --   ALT 20  --   AST 26  --   GLUCOSE 207* 225*     01/25/2013 20:54   Color, Urine  YELLOW   APPearance  CLOUDY (A)   Specific Gravity  1.008   pH  5.5   Glucose  NEGATIVE   Bilirubin  NEGATIVE   Ketones  NEGATIVE   Protein  NEGATIVE   Urobilinogen  0.2   Nitrite  NEGATIVE   Leukocytes  MODERATE (A)   Hgb  MODERATE (A)   WBC  11-20   RBC / HPF  7-10   Squamous Epithelial / LPF  RARE   Bacteria, UA  FEW (A)   Lactic acid 0.92  INR 2.59  ProBNP 1207  Procalcitonin 0.11  UCx- Ecoli >100,000  Imaging/Diagnostic Tests: CXR 01/25/13 1. Question of mild focal medial right basilar airspace opacity,  possibly reflecting pneumonia.  2. Findings of COPD; mild cardiomegaly.   Anselm Lis, MD 01/27/2013, 7:16 AM PGY-1, [facility] Family Medicine FPTS Intern pager: [phone], text pages welcome

## 2013-01-27 NOTE — Care Management Note (Signed)
    Page 1 of 1   01/27/2013     4:12:50 PM   CARE MANAGEMENT NOTE 01/27/2013  Patient:  Suzanne [name], Suzanne [name]   Account Number:  [id number]  Date Initiated:  01/27/2013  Documentation initiated by:  Kamesha Herne  Subjective/Objective Assessment:   PT ADM ON 01/25/13 WITH UROSEPSIS, COPD EXACERBATION, HYPOTENSION.  PTA, PT INDEPENDENT, LIVES ALONE; DAUGHTER LIVES NEARBY.     Action/Plan:   P.T. EVAL TODAY; RECOMMENDING ROLLATOR FOR HOME.  NO OTHER FOLLOW UP NEEDED.  ORDER FOR DME OBTAINED AND REFERRAL TO [facility] FOR DME NEEDS.   Anticipated DC Date:  01/27/2013   Anticipated DC Plan:  HOME/SELF CARE      DC Planning Services  CM consult      Choice offered to / List presented to:     DME arranged  Levan Hurst      DME agency  [facility]        Status of service:  Completed, signed off Medicare Important Message given?   (If response is "NO", the following Medicare IM given date fields will be blank) Date Medicare IM given:   Date Additional Medicare IM given:    Discharge Disposition:  HOME/SELF CARE  Per UR Regulation:  Reviewed for med. necessity/level of care/duration of stay  If discussed at Long Length of Stay Meetings, dates discussed:    Comments:

## 2013-01-28 ENCOUNTER — Telehealth: Payer: Self-pay | Admitting: Pharmacist Clinician (PhC)/ Clinical Pharmacy Specialist

## 2013-01-28 DIAGNOSIS — I82409 Acute embolism and thrombosis of unspecified deep veins of unspecified lower extremity: Secondary | ICD-10-CM

## 2013-01-28 DIAGNOSIS — Z7901 Long term (current) use of anticoagulants: Secondary | ICD-10-CM

## 2013-01-28 LAB — LEGIONELLA ANTIGEN, URINE: Legionella Antigen, Urine: NEGATIVE

## 2013-01-28 LAB — URINE CULTURE: Colony Count: >=100000

## 2013-01-28 LAB — CULTURE, RESPIRATORY W GRAM STAIN

## 2013-01-28 NOTE — Telephone Encounter (Signed)
Pt was as Dr. Cleta Alberts on Tuesday, had INR drawn, but then went to hospital. INR was 2.5.  Was d/c yesterday (Thursday) with INR of 2.1 and will start today on po levaquin.  I asked pt to go to lab on Monday for repeat INR,  Pt voiced understanding.

## 2013-01-28 NOTE — Telephone Encounter (Signed)
Standing orders to [facility]

## 2013-01-29 NOTE — Discharge Summary (Signed)
I have seen and examined this patient. I have discussed with Dr Marsh.  I agree with their findings and plans as documented in their progress note.    

## 2013-02-01 LAB — CULTURE, BLOOD (ROUTINE X 2)
Culture: NO GROWTH
Culture: NO GROWTH

## 2013-02-02 ENCOUNTER — Ambulatory Visit (INDEPENDENT_AMBULATORY_CARE_PROVIDER_SITE_OTHER): Payer: Medicare Other | Admitting: Pharmacist Clinician (PhC)/ Clinical Pharmacy Specialist

## 2013-02-02 DIAGNOSIS — Z7901 Long term (current) use of anticoagulants: Secondary | ICD-10-CM

## 2013-02-02 DIAGNOSIS — I82409 Acute embolism and thrombosis of unspecified deep veins of unspecified lower extremity: Secondary | ICD-10-CM

## 2013-02-05 ENCOUNTER — Ambulatory Visit (INDEPENDENT_AMBULATORY_CARE_PROVIDER_SITE_OTHER): Payer: Medicare Other | Admitting: Emergency Medicine

## 2013-02-05 VITALS — BP 148/74 | HR 81 | Temp 97.9°F | Resp 16 | Wt 139.0 lb

## 2013-02-05 DIAGNOSIS — G629 Polyneuropathy, unspecified: Secondary | ICD-10-CM

## 2013-02-05 DIAGNOSIS — J189 Pneumonia, unspecified organism: Secondary | ICD-10-CM

## 2013-02-05 DIAGNOSIS — A419 Sepsis, unspecified organism: Secondary | ICD-10-CM

## 2013-02-05 DIAGNOSIS — M79609 Pain in unspecified limb: Secondary | ICD-10-CM

## 2013-02-05 DIAGNOSIS — N390 Urinary tract infection, site not specified: Secondary | ICD-10-CM

## 2013-02-05 DIAGNOSIS — G609 Hereditary and idiopathic neuropathy, unspecified: Secondary | ICD-10-CM

## 2013-02-05 DIAGNOSIS — M79673 Pain in unspecified foot: Secondary | ICD-10-CM

## 2013-02-05 DIAGNOSIS — E119 Type 2 diabetes mellitus without complications: Secondary | ICD-10-CM

## 2013-02-05 NOTE — Progress Notes (Signed)
   Subjective:    Patient ID: Suzanne [name], female    DOB: 1921/08/27, 79 y.o.   MRN: [id number] This chart was scribed for Lesle Chris, MD by Landis Gandy, ED Scribe. This patient was seen in room 14 and the patient's care was started at 11:02 AM.  Chief Complaint  Patient presents with  . Follow-up    released from hospital/pneumonia   Patient hospitalized with urosepsis. She responded to fluids IV antibiotics. She had a respiratory infection while in the hospital. This was felt to be probably viral in origin. She has just completed a course of Levaquin and is feeling better. She struggles with severe pain and discomfort in both of her feet. She is known to have neuropathy peripheral vascular disease and venous insufficiency. She has a history of a nonhealing wound on the back of her left heel. HPI  HPI Comments: Suzanne [name] is a 79 y.o. female who was recently hospitalized for urosepsis.  Pt states that she got a cold while she was hospitalized. She denies any cough. She states she feels stronger than she did before she was hospitalized.   Pt also presents her left foot. Pt states that both of her feet are swollen, and they both seep fluid. She also states that her heel is sore.     Review of Systems     Objective:   Physical Exam   CONSTITUTIONAL: Well developed/well nourished HEAD: Normocephalic/atraumatic EYES: EOMI/PERRL ENMT: Mucous membranes moist NECK: supple no meningeal signs SPINE:entire spine nontender CV: S1/S2 noted, no murmurs/rubs/gallops noted LUNGS THERE are wet rhonchi present right mid lung lower lobe  ABDOMEN: soft, nontender, no rebound or guarding GU:no cva tenderness NEURO: Pt is awake/alert, moves all extremitiesx4 EXTREMITIES: Pulses are not present in either foot. There is dependent cyanosis. There is redness and peeling of the skin of both feet.  SKIN: warm, color normal PSYCH: no abnormalities of mood noted  Triage Vitals: BP 148/74   Pulse 81  Temp(Src) 97.9 F (36.6 C)  Resp 16  Wt 139 lb (63.05 kg)  SpO2 96%       Assessment & Plan:  Patient stable at present. She was treated for urosepsis in the hospital. She has finished her antibiotics. She is recovering from a respiratory infection but it is improving. Her vital signs look great today. She is having increasing trouble with her feet due to her peripheral vascular disease neuropathy and venous insufficiency. I personally performed the services described in this documentation, which was scribed in my presence. The recorded information has been reviewed and is accurate.

## 2013-02-06 NOTE — Addendum Note (Signed)
Addended by: Johnnette Litter on: 02/06/2013 03:26 PM   Modules accepted: Orders

## 2013-02-09 ENCOUNTER — Other Ambulatory Visit: Payer: Self-pay | Admitting: Cardiovascular Disease

## 2013-02-09 LAB — PROTIME-INR
INR: 2.33 — ABNORMAL HIGH (ref <1.50)
Prothrombin Time: 25 seconds — ABNORMAL HIGH (ref 11.6–15.2)

## 2013-02-09 LAB — URINE CULTURE: Colony Count: 30000

## 2013-02-14 ENCOUNTER — Ambulatory Visit (INDEPENDENT_AMBULATORY_CARE_PROVIDER_SITE_OTHER): Payer: Medicare Other | Admitting: Pharmacist Clinician (PhC)/ Clinical Pharmacy Specialist

## 2013-02-14 ENCOUNTER — Telehealth: Payer: Self-pay

## 2013-02-14 DIAGNOSIS — I82409 Acute embolism and thrombosis of unspecified deep veins of unspecified lower extremity: Secondary | ICD-10-CM

## 2013-02-14 DIAGNOSIS — Z7901 Long term (current) use of anticoagulants: Secondary | ICD-10-CM

## 2013-02-14 DIAGNOSIS — I10 Essential (primary) hypertension: Secondary | ICD-10-CM

## 2013-02-14 NOTE — Telephone Encounter (Signed)
What medication is she requesting? She states she needs Lasix and Fish Oil pended, these will print

## 2013-02-14 NOTE — Telephone Encounter (Signed)
Patient is requesting paper script to take to [facility] to have filled   Please call when ready for pick up   [phone]

## 2013-02-14 NOTE — Telephone Encounter (Signed)
Patient called to check the status of her medications

## 2013-02-14 NOTE — Telephone Encounter (Signed)
We need to call her daughter Almira Coaster and have her bring in a copy of all the medications that she needs and I can print out prescriptions then

## 2013-02-15 NOTE — Telephone Encounter (Signed)
Called daughter Almira Coaster these are the only 2 she needs, they did not have refills available. These have expired all other meds have refills and are available. Patient will see you in Feb. Unless something changes.

## 2013-02-16 MED ORDER — FISH OIL 1200 MG PO CAPS: 1200.0000 mg | ORAL_CAPSULE | Freq: Two times a day (BID) | ORAL | Status: DC

## 2013-02-16 MED ORDER — FUROSEMIDE 40 MG PO TABS: 40.0000 mg | ORAL_TABLET | Freq: Every day | ORAL | Status: DC

## 2013-03-04 ENCOUNTER — Other Ambulatory Visit: Payer: Self-pay | Admitting: Cardiovascular Disease

## 2013-03-05 LAB — PROTIME-INR
INR: 3.04 — AB (ref <1.50)
Prothrombin Time: 30.6 seconds — ABNORMAL HIGH (ref 11.6–15.2)

## 2013-03-07 ENCOUNTER — Ambulatory Visit (INDEPENDENT_AMBULATORY_CARE_PROVIDER_SITE_OTHER): Payer: Medicare Other | Admitting: Pharmacist Clinician (PhC)/ Clinical Pharmacy Specialist

## 2013-03-07 DIAGNOSIS — Z7901 Long term (current) use of anticoagulants: Secondary | ICD-10-CM

## 2013-03-10 ENCOUNTER — Other Ambulatory Visit: Payer: Self-pay | Admitting: Emergency Medicine

## 2013-03-15 ENCOUNTER — Other Ambulatory Visit: Payer: Self-pay | Admitting: Emergency Medicine

## 2013-03-18 ENCOUNTER — Encounter (HOSPITAL_BASED_OUTPATIENT_CLINIC_OR_DEPARTMENT_OTHER): Payer: Medicare Other | Attending: General Surgery

## 2013-03-18 DIAGNOSIS — E1169 Type 2 diabetes mellitus with other specified complication: Secondary | ICD-10-CM | POA: Insufficient documentation

## 2013-03-18 DIAGNOSIS — L97409 Non-pressure chronic ulcer of unspecified heel and midfoot with unspecified severity: Secondary | ICD-10-CM | POA: Insufficient documentation

## 2013-03-22 ENCOUNTER — Telehealth: Payer: Self-pay | Admitting: *Deleted

## 2013-03-22 NOTE — Telephone Encounter (Signed)
Pt stated that Dr. Jerline Pain wanted Ms. Thorley to come in ASAP for Dr. Gwenlyn Found to look at her foot and he was supposed to talk to Dr. Gwenlyn Found. Dr. Gwenlyn Found does not have any appointments until March.  JB

## 2013-03-22 NOTE — Telephone Encounter (Signed)
Returned call and pt verified x 2.  Pt stated she has been going to the wound center about her foot w/ Dr. Jerline Pain.  Stated he suggested she see Dr. Gwenlyn Found.  Stated her foot is in pretty bad shape.  Pt informed next available appt w/ Dr. Gwenlyn Found is 3.6.15.  Pt stated she is in so much pain and cannot wait that long.  Wanted to know if she could be worked in.  Pt informed Curt Bears, RN will be notified to find out if any information received from dr. Marigene Ehlers office and this RN will call her back.  Pt verbalized understanding and agreed w/ plan.  Curt Bears, RN notified and advised no information received.  Give pt's information to [place], scheduler, and she will try to work pt in sooner than 3.6.15.   Call to pt and informed.  Pt also advised to have Dr. Jerline Pain give her pain meds to last until she can be seen since she stated he has prescribed some already and pt concerned about pain level w/ wound.  Pt verbalized understanding and agreed w/ plan.

## 2013-03-24 ENCOUNTER — Ambulatory Visit (INDEPENDENT_AMBULATORY_CARE_PROVIDER_SITE_OTHER): Payer: Medicare Other | Admitting: Cardiovascular Disease

## 2013-03-24 ENCOUNTER — Ambulatory Visit (INDEPENDENT_AMBULATORY_CARE_PROVIDER_SITE_OTHER): Payer: Medicare Other | Admitting: Pharmacist Clinician (PhC)/ Clinical Pharmacy Specialist

## 2013-03-24 ENCOUNTER — Encounter: Payer: Self-pay | Admitting: Cardiovascular Disease

## 2013-03-24 VITALS — BP 122/66 | HR 78 | Ht 62.5 in | Wt 141.5 lb

## 2013-03-24 DIAGNOSIS — I701 Atherosclerosis of renal artery: Secondary | ICD-10-CM

## 2013-03-24 DIAGNOSIS — I999 Unspecified disorder of circulatory system: Secondary | ICD-10-CM

## 2013-03-24 DIAGNOSIS — J4489 Other specified chronic obstructive pulmonary disease: Secondary | ICD-10-CM

## 2013-03-24 DIAGNOSIS — I70229 Atherosclerosis of native arteries of extremities with rest pain, unspecified extremity: Secondary | ICD-10-CM

## 2013-03-24 DIAGNOSIS — Z5181 Encounter for therapeutic drug level monitoring: Secondary | ICD-10-CM

## 2013-03-24 DIAGNOSIS — Z7901 Long term (current) use of anticoagulants: Secondary | ICD-10-CM

## 2013-03-24 DIAGNOSIS — I998 Other disorder of circulatory system: Secondary | ICD-10-CM

## 2013-03-24 DIAGNOSIS — I2510 Atherosclerotic heart disease of native coronary artery without angina pectoris: Secondary | ICD-10-CM

## 2013-03-24 DIAGNOSIS — J449 Chronic obstructive pulmonary disease, unspecified: Secondary | ICD-10-CM

## 2013-03-24 DIAGNOSIS — I739 Peripheral vascular disease, unspecified: Secondary | ICD-10-CM

## 2013-03-24 DIAGNOSIS — I82409 Acute embolism and thrombosis of unspecified deep veins of unspecified lower extremity: Secondary | ICD-10-CM

## 2013-03-24 LAB — POCT INR: INR: 2.3

## 2013-03-24 NOTE — Patient Instructions (Addendum)
Dr Gwenlyn Found has ordered an echocardiogram and a lower extremity doppler.  Dr Gwenlyn Found will review the doppler results prior to the angiogram.   Dr. Gwenlyn Found has ordered a peripheral angiogram to be done at [facility].  This procedure is going to look at the bloodflow in your lower extremities.  If Dr. Gwenlyn Found is able to open up the arteries, you will have to spend one night in the hospital.  If he is not able to open the arteries, you will be able to go home that same day.    After the procedure, you will not be allowed to drive for 3 days or push, pull, or lift anything greater than 10 lbs for one week.    You will receive a phone call from [facility] the day before the procedure.  When they call you, please report to the hospital so that you can begin receiving IV fluids to prepare your kidneys for the upcoming catheterization.  If you have not received a phone call from the hospital by 2pm, please call our office and let us know.     Fyi: it will be a left groin antegrade stick   Prior to the procedure:  Hold the lasix, and cozaar (losartan)  3 days prior to the procedure and coumadin 5 days prior to the procedure.

## 2013-03-24 NOTE — Progress Notes (Signed)
03/24/2013 Suzanne [name]   02/19/1921  [id number]  Primary Physician DAUB, Lina Sayre, MD Primary Cardiologist: Lorretta Harp MD Renae Gloss   HPI:  The patient is an 80 year old, thin and frail-appearing, widowed Caucasian female, mother of 1 daughter Suzanne [name]) who accompanies her today. I last saw her approximately 8 months ago. She has a history of mild CAD by cath which I performed October 12, 2008. Her EF at that time was 35% to 40% and by echo her EF is normal done February 16, 3266, with diastolic dysfunction. She has had episodes of CHF exacerbation. Her other problems include COPD on home oxygen, hypertension and hyperlipidemia and peripheral vascular occlusive disease. She has had aortobifemoral bypass grafting remotely as well as fem-peroneal bypass grafting. I last angiogram'd her February 02, 1998, revealing patient aortobifemoral and a patent right fem-peroneal bypass graft with a high-grade lesion beyond the graft insertion. She had 1-vessel runoff on the right, 2 on the left with high-grade distal left SFA disease on the left and bilateral renal artery stenosis. She denies chest pain but is chronically short of breath. She has developed an open wound on her left heel and is going to the wound care center. Her last Doppler of her lower extremities performed a year ago revealed a right ABI of 0.75 with a patent fem-peroneal bypass graft, an occluded right SFA with 1-vessel runoff and high-grade left common femoral and SFA disease. She also has moderate renal insufficiency followed by Dr. Erling Cruz. Her major complaints are shortness of breath. She is on home O2. She is scheduled to see Dr. Everlene Farrier in the office next week for continued evaluation. She denies chest pain.  I last saw her in the office 11/12/12. Since that time she has been admitted for urosepsis. She has progressive critical limb ischemia in her right foot with chronic pain requiring narcotics. After long discussion  with the patient and her daughter Suzanne [name] we have decided to proceed with angiography potential intervention. I'm going to get a 2-D echo and lower extremity arterial Doppler studies. We will stop her Lasix and Cozaar prior to the procedure as well as her Coumadin her Coumadin the night before for hydration.    Current Outpatient Prescriptions  Medication Sig Dispense Refill  . acetaminophen (TYLENOL) 500 MG tablet Take 500 mg by mouth every 4 (four) hours as needed (pain).      [name] albuterol (PROVENTIL) (2.5 MG/3ML) 0.083% nebulizer solution Take 2.5 mg by nebulization See admin instructions. Use twice daily and a third time as needed for shortness of breath or wheezing      . Alcohol Swabs (WEBCOL ALCOHOL PREP MEDIUM) 70 % PADS 1 application by Does not apply route 1 day or 1 dose.  100 each  3  . Blood Glucose Monitoring Suppl (BLOOD GLUCOSE METER) kit Use as instructed  1 each  0  . budesonide-formoterol (SYMBICORT) 160-4.5 MCG/ACT inhaler Inhale 2 puffs into the lungs 2 (two) times daily.  3 Inhaler  3  . Calcium Carb-Cholecalciferol (CALCIUM 600 + D PO) Take 600 mg by mouth at bedtime.      [name] esomeprazole (NEXIUM) 40 MG capsule Take 1 capsule (40 mg total) by mouth daily before breakfast.  90 capsule  3  . ferrous sulfate 325 (65 FE) MG tablet Take 325 mg by mouth at bedtime.      . furosemide (LASIX) 40 MG tablet Take 1 tablet (40 mg total) by mouth daily.  90 tablet  0  . glucose blood test strip Use as instructed  100 each  12  . HYDROcodone-acetaminophen (NORCO/VICODIN) 5-325 MG per tablet Take 1 tablet by mouth every 6 (six) hours as needed for moderate pain.      [name] ibandronate (BONIVA) 150 MG tablet Take 150 mg by mouth every 30 (thirty) days. Take in the morning with a full glass of water, on an empty stomach, and do not take anything else by mouth or lie down for the next 30 min. (take on the 25th of each month)      . Insulin Pen Needle 31G X 5 MM MISC 16 Units by Does not apply route  daily.      . Insulin Syringe-Needle U-100 30G X 1/2" 1 ML MISC 20 Units by Does not apply route daily.  100 each  3  . Ipratropium-Albuterol (COMBIVENT RESPIMAT) 20-100 MCG/ACT AERS respimat Inhale 2 puffs into the lungs every 6 (six) hours.      . Lancets (ONETOUCH ULTRASOFT) lancets Insulin dependant diabetes patient checks blood sugars tid  300 each  4  . LANTUS SOLOSTAR 100 UNIT/ML Solostar Pen INJECT 16 UNITS DAILY  15 mL  1  . lidocaine (LIDODERM) 5 % Place 1 patch onto the skin daily. Remove & Discard patch within 12 hours or as directed by MD  90 patch  3  . losartan (COZAAR) 100 MG tablet Take 1 tablet (100 mg total) by mouth daily.  90 tablet  3  . metoprolol succinate (TOPROL-XL) 25 MG 24 hr tablet Take 25 mg by mouth at bedtime.      . Multiple Vitamin (MULTIVITAMIN WITH MINERALS) TABS tablet Take 1 tablet by mouth at bedtime.      . Omega-3 Fatty Acids (FISH OIL) 1200 MG CAPS Take 1 capsule (1,200 mg total) by mouth 2 (two) times daily.  180 capsule  0  . PRESCRIPTION MEDICATION Apply 1 application topically every other day. Collagen mesh patch for wound care on heel of left foot      . ranitidine (ZANTAC) 150 MG tablet Take 1 tablet (150 mg total) by mouth 2 (two) times daily.  180 tablet  11  . rosuvastatin (CRESTOR) 20 MG tablet Take 1 tablet (20 mg total) by mouth daily. PATIENT NEEDS OFFICE VISIT/LABS FOR ADDITIONAL REFILLS  90 tablet  0  . traMADol (ULTRAM) 50 MG tablet Take 50 mg by mouth every 6 (six) hours as needed.      . warfarin (COUMADIN) 2.5 MG tablet Take 1.25-2.5 mg by mouth daily at 6 PM. Take 1/2 tablet (1.25 mg) on Monday, Wednesday and Friday), take 1 tablet (2.5 mg) on Tuesday, Thursday, Saturday, Sunday       No current facility-administered medications for this visit.    Allergies  Allergen Reactions  . Codeine Other (See Comments)    hallucinations  . Hydrochlorothiazide Other (See Comments)    hypotension  . Other     Adhesives and bandages gives pt a  rash.  . Pregabalin Other (See Comments)    unknown    History   Social History  . Marital Status: Widowed    Spouse Name: N/A    Number of Children: N/A  . Years of Education: N/A   Occupational History  . Not on file.   Social History Main Topics  . Smoking status: Former Smoker    Types: Cigarettes    Quit date: 02/16/1997  . Smokeless tobacco: Never Used  . Alcohol Use: No  .  Drug Use: No  . Sexual Activity: No   Other Topics Concern  . Not on file   Social History Narrative   Lives by self, cares for self, drives.       Review of Systems: General: negative for chills, fever, night sweats or weight changes.  Cardiovascular: negative for chest pain, dyspnea on exertion, edema, orthopnea, palpitations, paroxysmal nocturnal dyspnea or shortness of breath Dermatological: negative for rash Respiratory: negative for cough or wheezing Urologic: negative for hematuria Abdominal: negative for nausea, vomiting, diarrhea, bright red blood per rectum, melena, or hematemesis Neurologic: negative for visual changes, syncope, or dizziness All other systems reviewed and are otherwise negative except as noted above.    Blood pressure 122/66, pulse 78, height 5' 2.5" (1.588 m), weight 141 lb 8 oz (64.184 kg).  General appearance: alert and no distress Neck: no adenopathy, no JVD, supple, symmetrical, trachea midline, thyroid not enlarged, symmetric, no tenderness/mass/nodules and bbilateral carotid bruits left louder than right Lungs: clear to auscultation bilaterally Heart: regular rate and rhythm, S1, S2 normal, no murmur, click, rub or gallop Extremities: chronically erythematous and ischemic appearing right but  EKG normal sinus rhythm at 78 with left anterior fascicular block and nonspecific IVCD  ASSESSMENT AND PLAN:   PVD (peripheral vascular disease) Status post remote left common iliac artery PTA and stenting by myself back in 19 97 and right SFA PTA. Following this  she had aortobifemoral bypass grafting performed by Dr. Erskin Burnet as well as right femoroperoneal bypass grafting. Her last intervention which I performed 02/02/98 revealed a patent aortobifemoral bypass graft, patent right fem-tib bypass graft with a 95% circumflex stenosis in the peroneal artery this showed occluded right SFA and anterior posterior tibials. She also had a 50 present distal left SFA/proximal popliteal artery stenosis with 2 vessel runoff. She's had high velocities in the distal left SFA for many years now and now has developed ongoing ischemia on that side with chronic pain. She also has moderate renal insufficiency. I believe it without intervention she will ultimately lose her leg and required BKA. I was in there many confounding issues including her renal function. After long discussion it was decided to proceed with angiography of intraoperative intervention. I will get a 2-D echo lower extremity arterial Doppler studies. Will stop her Coumadin several days before as well as Lasix and Cozaar, and the the night prior to hydration and perform an antegrade stick on her aortobifemoral bypass graft, angiography and potential intervention.      Lorretta Harp MD FACP,FACC,FAHA, [facility] 03/24/2013 3:29 PM

## 2013-03-24 NOTE — Assessment & Plan Note (Signed)
Status post remote left common iliac artery PTA and stenting by myself back in 19 97 and right SFA PTA. Following this she had aortobifemoral bypass grafting performed by Dr. Erskin Burnet as well as right femoroperoneal bypass grafting. Her last intervention which I performed 02/02/98 revealed a patent aortobifemoral bypass graft, patent right fem-tib bypass graft with a 95% circumflex stenosis in the peroneal artery this showed occluded right SFA and anterior posterior tibials. She also had a 50 present distal left SFA/proximal popliteal artery stenosis with 2 vessel runoff. She's had high velocities in the distal left SFA for many years now and now has developed ongoing ischemia on that side with chronic pain. She also has moderate renal insufficiency. I believe it without intervention she will ultimately lose her leg and required BKA. I was in there many confounding issues including her renal function. After long discussion it was decided to proceed with angiography of intraoperative intervention. I will get a 2-D echo lower extremity arterial Doppler studies. Will stop her Coumadin several days before as well as Lasix and Cozaar, and the the night prior to hydration and perform an antegrade stick on her aortobifemoral bypass graft, angiography and potential intervention.

## 2013-03-25 ENCOUNTER — Encounter: Payer: Self-pay | Admitting: Cardiovascular Disease

## 2013-03-26 ENCOUNTER — Telehealth: Payer: Self-pay

## 2013-03-26 ENCOUNTER — Telehealth: Payer: Self-pay | Admitting: *Deleted

## 2013-03-26 NOTE — Telephone Encounter (Signed)
LM for rtn call- Pt will need to RTC for a prescription to be given.

## 2013-03-26 NOTE — Telephone Encounter (Signed)
Patients daughter is calling to ask if there is any way possible that her mom can get a refill on her hydrocodone she is in a ton of pain and cannot get the rx until Monday from the other doctor she is about to have her leg amputated please call daughter at [phone]

## 2013-03-26 NOTE — Telephone Encounter (Signed)
Suzanne [name], daughter is calling to see if we would be able to send 2 days worth of Tramadol over to the pharmacy. Pt is scheduled to have her leg amputated on Feb 16. Surgeon has Rx (Hydrocodone)  for her at his office and she is unable to pick up until Monday. Can we call in Tramadol to help alleviate some pain until they can get the other script?

## 2013-03-28 NOTE — Telephone Encounter (Signed)
Spoke with Suzanne [name], she was able to get some mediation today from another doctor. She accepted our apology in late response to her message.

## 2013-03-28 NOTE — Telephone Encounter (Signed)
Please call the patient's daughter and apologize for [place] not getting back with her over the weekend if she still needs the pain medications I am happy to send her some to the pharmacy.

## 2013-03-29 ENCOUNTER — Encounter (HOSPITAL_COMMUNITY): Payer: Self-pay | Admitting: Pharmacy Technician

## 2013-03-31 ENCOUNTER — Ambulatory Visit (HOSPITAL_COMMUNITY)
Admission: RE | Admit: 2013-03-31 | Discharge: 2013-03-31 | Disposition: A | Discharge status: Home / Self Care | Payer: Medicare Other | Source: Ambulatory Visit | Attending: Internal Medicine | Admitting: Internal Medicine

## 2013-03-31 DIAGNOSIS — I701 Atherosclerosis of renal artery: Secondary | ICD-10-CM | POA: Insufficient documentation

## 2013-03-31 DIAGNOSIS — J4489 Other specified chronic obstructive pulmonary disease: Secondary | ICD-10-CM | POA: Insufficient documentation

## 2013-03-31 DIAGNOSIS — I2510 Atherosclerotic heart disease of native coronary artery without angina pectoris: Secondary | ICD-10-CM

## 2013-03-31 DIAGNOSIS — I739 Peripheral vascular disease, unspecified: Secondary | ICD-10-CM | POA: Insufficient documentation

## 2013-03-31 DIAGNOSIS — I079 Rheumatic tricuspid valve disease, unspecified: Secondary | ICD-10-CM | POA: Insufficient documentation

## 2013-03-31 DIAGNOSIS — J449 Chronic obstructive pulmonary disease, unspecified: Secondary | ICD-10-CM | POA: Insufficient documentation

## 2013-03-31 DIAGNOSIS — I059 Rheumatic mitral valve disease, unspecified: Secondary | ICD-10-CM | POA: Insufficient documentation

## 2013-03-31 NOTE — Progress Notes (Signed)
2D Echo Performed 03/31/2013    Suzanne [name], RCS

## 2013-04-01 ENCOUNTER — Ambulatory Visit (HOSPITAL_COMMUNITY)
Admission: RE | Admit: 2013-04-01 | Discharge: 2013-04-01 | Disposition: A | Discharge status: Home / Self Care | Payer: Medicare Other | Source: Ambulatory Visit | Attending: Internal Medicine | Admitting: Internal Medicine

## 2013-04-01 DIAGNOSIS — I70269 Atherosclerosis of native arteries of extremities with gangrene, unspecified extremity: Secondary | ICD-10-CM | POA: Insufficient documentation

## 2013-04-01 DIAGNOSIS — I739 Peripheral vascular disease, unspecified: Secondary | ICD-10-CM

## 2013-04-01 DIAGNOSIS — I998 Other disorder of circulatory system: Secondary | ICD-10-CM

## 2013-04-01 DIAGNOSIS — I70229 Atherosclerosis of native arteries of extremities with rest pain, unspecified extremity: Secondary | ICD-10-CM

## 2013-04-01 DIAGNOSIS — I70219 Atherosclerosis of native arteries of extremities with intermittent claudication, unspecified extremity: Secondary | ICD-10-CM

## 2013-04-01 NOTE — Progress Notes (Signed)
Arterial Duplex Lower Ext. Completed. Myrikal Messmer, BS, RDMS, RVT  

## 2013-04-03 ENCOUNTER — Encounter (HOSPITAL_COMMUNITY): Payer: Self-pay | Admitting: Cardiology

## 2013-04-03 ENCOUNTER — Inpatient Hospital Stay (HOSPITAL_COMMUNITY)
Admission: RE | Admit: 2013-04-03 | Discharge: 2013-04-05 | DRG: 300 | Disposition: A | Discharge status: Home / Self Care | Payer: Medicare Other | Source: Ambulatory Visit | Attending: Cardiovascular Disease | Admitting: Cardiovascular Disease

## 2013-04-03 DIAGNOSIS — I2510 Atherosclerotic heart disease of native coronary artery without angina pectoris: Secondary | ICD-10-CM | POA: Diagnosis present

## 2013-04-03 DIAGNOSIS — D649 Anemia, unspecified: Secondary | ICD-10-CM | POA: Diagnosis present

## 2013-04-03 DIAGNOSIS — J449 Chronic obstructive pulmonary disease, unspecified: Secondary | ICD-10-CM | POA: Diagnosis present

## 2013-04-03 DIAGNOSIS — L8992 Pressure ulcer of unspecified site, stage 2: Secondary | ICD-10-CM | POA: Diagnosis present

## 2013-04-03 DIAGNOSIS — G8929 Other chronic pain: Secondary | ICD-10-CM | POA: Diagnosis present

## 2013-04-03 DIAGNOSIS — Z79899 Other long term (current) drug therapy: Secondary | ICD-10-CM

## 2013-04-03 DIAGNOSIS — I129 Hypertensive chronic kidney disease with stage 1 through stage 4 chronic kidney disease, or unspecified chronic kidney disease: Secondary | ICD-10-CM | POA: Diagnosis present

## 2013-04-03 DIAGNOSIS — N184 Chronic kidney disease, stage 4 (severe): Secondary | ICD-10-CM | POA: Diagnosis present

## 2013-04-03 DIAGNOSIS — Z794 Long term (current) use of insulin: Secondary | ICD-10-CM

## 2013-04-03 DIAGNOSIS — Z9981 Dependence on supplemental oxygen: Secondary | ICD-10-CM

## 2013-04-03 DIAGNOSIS — N189 Chronic kidney disease, unspecified: Secondary | ICD-10-CM

## 2013-04-03 DIAGNOSIS — L89609 Pressure ulcer of unspecified heel, unspecified stage: Secondary | ICD-10-CM | POA: Diagnosis present

## 2013-04-03 DIAGNOSIS — L98499 Non-pressure chronic ulcer of skin of other sites with unspecified severity: Principal | ICD-10-CM | POA: Diagnosis present

## 2013-04-03 DIAGNOSIS — I5032 Chronic diastolic (congestive) heart failure: Secondary | ICD-10-CM | POA: Diagnosis present

## 2013-04-03 DIAGNOSIS — I998 Other disorder of circulatory system: Secondary | ICD-10-CM

## 2013-04-03 DIAGNOSIS — Z87891 Personal history of nicotine dependence: Secondary | ICD-10-CM

## 2013-04-03 DIAGNOSIS — I70229 Atherosclerosis of native arteries of extremities with rest pain, unspecified extremity: Secondary | ICD-10-CM

## 2013-04-03 DIAGNOSIS — J4489 Other specified chronic obstructive pulmonary disease: Secondary | ICD-10-CM | POA: Diagnosis present

## 2013-04-03 DIAGNOSIS — I739 Peripheral vascular disease, unspecified: Principal | ICD-10-CM | POA: Diagnosis present

## 2013-04-03 DIAGNOSIS — Z7901 Long term (current) use of anticoagulants: Secondary | ICD-10-CM

## 2013-04-03 DIAGNOSIS — J9610 Chronic respiratory failure, unspecified whether with hypoxia or hypercapnia: Secondary | ICD-10-CM | POA: Diagnosis present

## 2013-04-03 DIAGNOSIS — I701 Atherosclerosis of renal artery: Secondary | ICD-10-CM | POA: Diagnosis present

## 2013-04-03 DIAGNOSIS — I509 Heart failure, unspecified: Secondary | ICD-10-CM | POA: Diagnosis present

## 2013-04-03 DIAGNOSIS — I4891 Unspecified atrial fibrillation: Secondary | ICD-10-CM | POA: Diagnosis present

## 2013-04-03 DIAGNOSIS — E785 Hyperlipidemia, unspecified: Secondary | ICD-10-CM | POA: Diagnosis present

## 2013-04-03 DIAGNOSIS — E119 Type 2 diabetes mellitus without complications: Secondary | ICD-10-CM | POA: Diagnosis present

## 2013-04-03 HISTORY — DX: Chronic diastolic (congestive) heart failure: I50.32

## 2013-04-03 HISTORY — DX: Chronic respiratory failure, unspecified whether with hypoxia or hypercapnia: J96.10

## 2013-04-03 HISTORY — DX: Long term (current) use of anticoagulants: Z79.01

## 2013-04-03 HISTORY — DX: Essential (primary) hypertension: I10

## 2013-04-03 LAB — GLUCOSE, CAPILLARY
GLUCOSE-CAPILLARY: 106 mg/dL — AB (ref 70–99)
Glucose-Capillary: 156 mg/dL — ABNORMAL HIGH (ref 70–99)

## 2013-04-03 LAB — BASIC METABOLIC PANEL
BUN: 43 mg/dL — AB (ref 6–23)
CALCIUM: 9 mg/dL (ref 8.4–10.5)
CO2: 23 meq/L (ref 19–32)
CREATININE: 1.9 mg/dL — AB (ref 0.50–1.10)
Chloride: 102 mEq/L (ref 96–112)
GFR calc Af Amer: 26 mL/min — ABNORMAL LOW (ref >90)
GFR calc non Af Amer: 22 mL/min — ABNORMAL LOW (ref >90)
Glucose, Bld: 167 mg/dL — ABNORMAL HIGH (ref 70–99)
Potassium: 4.5 mEq/L (ref 3.7–5.3)
Sodium: 137 mEq/L (ref 137–147)

## 2013-04-03 LAB — CBC
HEMATOCRIT: 29.5 % — AB (ref 36.0–46.0)
Hemoglobin: 9.7 g/dL — ABNORMAL LOW (ref 12.0–15.0)
MCH: 31.8 pg (ref 26.0–34.0)
MCHC: 32.9 g/dL (ref 30.0–36.0)
MCV: 96.7 fL (ref 78.0–100.0)
PLATELETS: 165 10*3/uL (ref 150–400)
RBC: 3.05 MIL/uL — ABNORMAL LOW (ref 3.87–5.11)
RDW: 12.6 % (ref 11.5–15.5)
WBC: 7 10*3/uL (ref 4.0–10.5)

## 2013-04-03 MED ORDER — PANTOPRAZOLE SODIUM 40 MG PO TBEC
80.0000 mg | DELAYED_RELEASE_TABLET | Freq: Every day | ORAL | Status: DC
  Administered 2013-04-04: 80 mg via ORAL
  Filled 2013-04-03: qty 2

## 2013-04-03 MED ORDER — SODIUM CHLORIDE 0.9 % IJ SOLN
3.0000 mL | INTRAMUSCULAR | Status: DC | PRN
Start: 2013-04-03 — End: 2013-04-04

## 2013-04-03 MED ORDER — IPRATROPIUM-ALBUTEROL 0.5-2.5 (3) MG/3ML IN SOLN: 3.0000 mL | Freq: Four times a day (QID) | RESPIRATORY_TRACT | Status: DC | PRN

## 2013-04-03 MED ORDER — FUROSEMIDE 40 MG PO TABS: 40.0000 mg | ORAL_TABLET | Freq: Every day | ORAL | Status: DC

## 2013-04-03 MED ORDER — SODIUM CHLORIDE 0.9 % IV SOLN
250.0000 mL | INTRAVENOUS | Status: DC | PRN
Start: 2013-04-03 — End: 2013-04-04

## 2013-04-03 MED ORDER — SODIUM CHLORIDE 0.9 % IJ SOLN
3.0000 mL | Freq: Two times a day (BID) | INTRAMUSCULAR | Status: DC
  Administered 2013-04-03: 3 mL via INTRAVENOUS

## 2013-04-03 MED ORDER — LOSARTAN POTASSIUM 50 MG PO TABS
100.0000 mg | ORAL_TABLET | Freq: Every day | ORAL | Status: DC
  Filled 2013-04-03: qty 2

## 2013-04-03 MED ORDER — LOSARTAN POTASSIUM 50 MG PO TABS: 100.0000 mg | ORAL_TABLET | Freq: Every day | ORAL | Status: DC

## 2013-04-03 MED ORDER — ASPIRIN 81 MG PO CHEW
81.0000 mg | CHEWABLE_TABLET | ORAL | Status: AC
  Administered 2013-04-04: 81 mg via ORAL
  Filled 2013-04-03: qty 1

## 2013-04-03 MED ORDER — SODIUM CHLORIDE 0.9 % IV SOLN
INTRAVENOUS | Status: DC
  Administered 2013-04-03: 20:00:00 via INTRAVENOUS

## 2013-04-03 MED ORDER — HYDROCODONE-ACETAMINOPHEN 5-325 MG PO TABS
1.0000 | ORAL_TABLET | Freq: Four times a day (QID) | ORAL | Status: DC | PRN
  Administered 2013-04-03 – 2013-04-05 (×6): 1 via ORAL
  Filled 2013-04-03 (×6): qty 1

## 2013-04-03 MED ORDER — IPRATROPIUM-ALBUTEROL 20-100 MCG/ACT IN AERS: 2.0000 | INHALATION_SPRAY | Freq: Four times a day (QID) | RESPIRATORY_TRACT | Status: DC | PRN

## 2013-04-03 MED ORDER — INSULIN GLARGINE 100 UNIT/ML SOLOSTAR PEN: 16.0000 [IU] | PEN_INJECTOR | Freq: Every day | SUBCUTANEOUS | Status: DC

## 2013-04-03 MED ORDER — ACETAMINOPHEN 500 MG PO TABS
1000.0000 mg | ORAL_TABLET | ORAL | Status: DC | PRN
  Administered 2013-04-03 – 2013-04-04 (×2): 1000 mg via ORAL
  Filled 2013-04-03 (×2): qty 2

## 2013-04-03 MED ORDER — METOPROLOL SUCCINATE ER 25 MG PO TB24
25.0000 mg | ORAL_TABLET | Freq: Every day | ORAL | Status: DC
  Administered 2013-04-05: 25 mg via ORAL
  Filled 2013-04-03 (×2): qty 1

## 2013-04-03 MED ORDER — INSULIN GLARGINE 100 UNIT/ML ~~LOC~~ SOLN
16.0000 [IU] | Freq: Every day | SUBCUTANEOUS | Status: DC
  Administered 2013-04-03 – 2013-04-04 (×2): 16 [IU] via SUBCUTANEOUS
  Filled 2013-04-03 (×4): qty 0.16

## 2013-04-03 MED ORDER — ATORVASTATIN CALCIUM 40 MG PO TABS
40.0000 mg | ORAL_TABLET | Freq: Every day | ORAL | Status: DC
  Administered 2013-04-03 – 2013-04-04 (×2): 40 mg via ORAL
  Filled 2013-04-03 (×4): qty 1

## 2013-04-03 MED ORDER — ALBUTEROL SULFATE (2.5 MG/3ML) 0.083% IN NEBU: 2.5000 mg | INHALATION_SOLUTION | Freq: Three times a day (TID) | RESPIRATORY_TRACT | Status: DC | PRN

## 2013-04-03 MED ORDER — FERROUS SULFATE 325 (65 FE) MG PO TABS
325.0000 mg | ORAL_TABLET | Freq: Every day | ORAL | Status: DC
  Administered 2013-04-03 – 2013-04-04 (×2): 325 mg via ORAL
  Filled 2013-04-03 (×4): qty 1

## 2013-04-03 MED ORDER — BUDESONIDE-FORMOTEROL FUMARATE 160-4.5 MCG/ACT IN AERO
2.0000 | INHALATION_SPRAY | Freq: Two times a day (BID) | RESPIRATORY_TRACT | Status: DC
  Administered 2013-04-03 – 2013-04-05 (×4): 2 via RESPIRATORY_TRACT
  Filled 2013-04-03 (×2): qty 6

## 2013-04-03 NOTE — Progress Notes (Signed)
PA paged to make aware of pts arrival and need for admission orders; will cont. To monitor.

## 2013-04-03 NOTE — Progress Notes (Signed)
O2 2.5 L [place] placed on pt per pt and daughter request; pt states she uses O2 2.5 L all times at home; will cont. To monitor.

## 2013-04-03 NOTE — H&P (Signed)
Primary Physician Jenny Reichmann, MD  Primary Cardiologist: Lorretta Harp MD  HPI: Suzanne [name] is a 80 y.o. female  admitted for planned hydration prior to angiography tomorrow.   She has a history of mild CAD by cath. Her EF is preserved by recent echo.  She previously has undergone angiography by Dr Gwenlyn Found February 02, 1998, revealing patient aortobifemoral and a patent right fem-peroneal bypass graft with a high-grade lesion beyond the graft insertion. She had 1-vessel runoff on the right, 2 on the left with high-grade distal left SFA disease on the left and bilateral renal artery stenosis. She denies chest pain but is chronically short of breath. She has developed an open wound on her left heel and is going to the wound care center. Her last Doppler of her lower extremities performed a year ago revealed a right ABI of 0.75 with a patent fem-peroneal bypass graft, an occluded right SFA with 1-vessel runoff and high-grade left common femoral and SFA disease. She also has moderate renal insufficiency followed by Dr. Erling Cruz. Her major complaints are shortness of breath. She is on home O2.  She has progressive critical limb ischemia in her right foot with chronic pain requiring narcotics.   She is now admitted for angiography with potential intervention.  PMH as above PSH as above  Current Outpatient Prescriptions   Medication  Sig  Dispense  Refill   .  acetaminophen (TYLENOL) 500 MG tablet  Take 500 mg by mouth every 4 (four) hours as needed (pain).     [name]  albuterol (PROVENTIL) (2.5 MG/3ML) 0.083% nebulizer solution  Take 2.5 mg by nebulization See admin instructions. Use twice daily and a third time as needed for shortness of breath or wheezing     .  Alcohol Swabs (WEBCOL ALCOHOL PREP MEDIUM) 70 % PADS  1 application by Does not apply route 1 day or 1 dose.  100 each  3   .  Blood Glucose Monitoring Suppl (BLOOD GLUCOSE METER) kit  Use as instructed  1 each  0   .  budesonide-formoterol  (SYMBICORT) 160-4.5 MCG/ACT inhaler  Inhale 2 puffs into the lungs 2 (two) times daily.  3 Inhaler  3   .  Calcium Carb-Cholecalciferol (CALCIUM 600 + D PO)  Take 600 mg by mouth at bedtime.     [name]  esomeprazole (NEXIUM) 40 MG capsule  Take 1 capsule (40 mg total) by mouth daily before breakfast.  90 capsule  3   .  ferrous sulfate 325 (65 FE) MG tablet  Take 325 mg by mouth at bedtime.     .  furosemide (LASIX) 40 MG tablet  Take 1 tablet (40 mg total) by mouth daily.  90 tablet  0   .  glucose blood test strip  Use as instructed  100 each  12   .  HYDROcodone-acetaminophen (NORCO/VICODIN) 5-325 MG per tablet  Take 1 tablet by mouth every 6 (six) hours as needed for moderate pain.     [name]  ibandronate (BONIVA) 150 MG tablet  Take 150 mg by mouth every 30 (thirty) days. Take in the morning with a full glass of water, on an empty stomach, and do not take anything else by mouth or lie down for the next 30 min. (take on the 25th of each month)     .  Insulin Pen Needle 31G X 5 MM MISC  16 Units by Does not apply route daily.     [name]  Insulin Syringe-Needle U-100 30G X 1/2" 1 ML MISC  20 Units by Does not apply route daily.  100 each  3   .  Ipratropium-Albuterol (COMBIVENT RESPIMAT) 20-100 MCG/ACT AERS respimat  Inhale 2 puffs into the lungs every 6 (six) hours.     .  Lancets (ONETOUCH ULTRASOFT) lancets  Insulin dependant diabetes patient checks blood sugars tid  300 each  4   .  LANTUS SOLOSTAR 100 UNIT/ML Solostar Pen  INJECT 16 UNITS DAILY  15 mL  1   .  lidocaine (LIDODERM) 5 %  Place 1 patch onto the skin daily. Remove & Discard patch within 12 hours or as directed by MD  90 patch  3   .  losartan (COZAAR) 100 MG tablet  Take 1 tablet (100 mg total) by mouth daily.  90 tablet  3   .  metoprolol succinate (TOPROL-XL) 25 MG 24 hr tablet  Take 25 mg by mouth at bedtime.     .  Multiple Vitamin (MULTIVITAMIN WITH MINERALS) TABS tablet  Take 1 tablet by mouth at bedtime.     .  Omega-3 Fatty Acids (FISH  OIL) 1200 MG CAPS  Take 1 capsule (1,200 mg total) by mouth 2 (two) times daily.  180 capsule  0   .  PRESCRIPTION MEDICATION  Apply 1 application topically every other day. Collagen mesh patch for wound care on heel of left foot     .  ranitidine (ZANTAC) 150 MG tablet  Take 1 tablet (150 mg total) by mouth 2 (two) times daily.  180 tablet  11   .  rosuvastatin (CRESTOR) 20 MG tablet  Take 1 tablet (20 mg total) by mouth daily. PATIENT NEEDS OFFICE VISIT/LABS FOR ADDITIONAL REFILLS  90 tablet  0   .  traMADol (ULTRAM) 50 MG tablet  Take 50 mg by mouth every 6 (six) hours as needed.     .  warfarin (COUMADIN) 2.5 MG tablet  Take 1.25-2.5 mg by mouth daily at 6 PM. Take 1/2 tablet (1.25 mg) on Monday, Wednesday and Friday), take 1 tablet (2.5 mg) on Tuesday, Thursday, Saturday, Sunday      No current facility-administered medications for this visit.    Allergies   Allergen  Reactions   .  Codeine  Other (See Comments)     hallucinations   .  Hydrochlorothiazide  Other (See Comments)     hypotension   .  Other      Adhesives and bandages gives pt a rash.   .  Pregabalin  Other (See Comments)     unknown    History    Social History   .  Marital Status:  Widowed     Spouse Name:  N/A     Number of Children:  N/A   .  Years of Education:  N/A    Occupational History   .  Not on file.    Social History Main Topics   .  Smoking status:  Former Smoker     Types:  Cigarettes     Quit date:  02/16/1997   .  Smokeless tobacco:  Never Used   .  Alcohol Use:  No   .  Drug Use:  No   .  Sexual Activity:  No    Other Topics  Concern   .  Not on file    Social History Narrative    Lives by self, cares for self, drives.   Review of  Systems:  All systems reviewed and negative except as above  Exam Filed Vitals:   04/03/13 1500  BP: 152/67  Pulse: 75  Temp: 97.5 F (36.4 C)  Resp: 16   General appearance: thin and frail, alert HEENT: OP clear Neck: no adenopathy, no JVD,  supple  Lungs: clear to auscultation bilaterally  Heart: regular rate and rhythm, S1, S2 normal, no murmur, click, rub or gallop  Extremities: chronically erythematous and ischemic appearing right and left feet Neuro: no focal neuro findings  EKG normal sinus rhythm at 78 with left anterior fascicular block and nonspecific IVCD    ASSESSMENT AND PLAN:  PVD (peripheral vascular disease)  Status post remote left common iliac artery PTA and stenting by myself back in 19 97 and right SFA PTA. Following this she had aortobifemoral bypass grafting performed by Dr. Erskin Burnet as well as right femoroperoneal bypass grafting.   She has now developed ongoing ischemia on that side with chronic pain. She also has moderate renal insufficiency. Per Dr Gwenlyn Found, without intervention she will ultimately lose her leg and required BKA. She will receive hydration overnight.  Her coumadin is on hold  2. AF Coumadin is on hold  3. CRI As above

## 2013-04-03 NOTE — Progress Notes (Signed)
Pt has been admitted for pre-cath hydration. She is scheduled to undergo a PV angiogram with Dr. Gwenlyn Found tomorrow for lower extremity claudication. See his office note from 03/24/13 for complete details. Pt is roomed and stable. Home meds and diet have been ordered. Pt will be NPO at midnight. Labs pending including CBC, BMP and INR. Will hydrate overnight with IVFs. Dr. Gwenlyn Found to see in the am.   Lyda Jester, PA-C

## 2013-04-04 ENCOUNTER — Encounter: Payer: Self-pay | Admitting: *Deleted

## 2013-04-04 ENCOUNTER — Other Ambulatory Visit: Payer: Self-pay

## 2013-04-04 ENCOUNTER — Telehealth: Payer: Self-pay | Admitting: Vascular Surgery

## 2013-04-04 ENCOUNTER — Encounter (HOSPITAL_COMMUNITY)
Admission: RE | Disposition: A | Discharge status: Home / Self Care | Payer: Self-pay | Source: Ambulatory Visit | Attending: Cardiovascular Disease

## 2013-04-04 DIAGNOSIS — I739 Peripheral vascular disease, unspecified: Secondary | ICD-10-CM

## 2013-04-04 DIAGNOSIS — I70229 Atherosclerosis of native arteries of extremities with rest pain, unspecified extremity: Secondary | ICD-10-CM

## 2013-04-04 DIAGNOSIS — I998 Other disorder of circulatory system: Secondary | ICD-10-CM

## 2013-04-04 HISTORY — PX: LOWER EXTREMITY ANGIOGRAM: SHX5508

## 2013-04-04 LAB — BASIC METABOLIC PANEL
BUN: 36 mg/dL — AB (ref 6–23)
CHLORIDE: 108 meq/L (ref 96–112)
CO2: 23 mEq/L (ref 19–32)
CREATININE: 1.93 mg/dL — AB (ref 0.50–1.10)
Calcium: 9.3 mg/dL (ref 8.4–10.5)
GFR, EST AFRICAN AMERICAN: 25 mL/min — AB (ref >90)
GFR, EST NON AFRICAN AMERICAN: 22 mL/min — AB (ref >90)
Glucose, Bld: 139 mg/dL — ABNORMAL HIGH (ref 70–99)
Potassium: 4.7 mEq/L (ref 3.7–5.3)
Sodium: 142 mEq/L (ref 137–147)

## 2013-04-04 LAB — GLUCOSE, CAPILLARY
GLUCOSE-CAPILLARY: 80 mg/dL (ref 70–99)
GLUCOSE-CAPILLARY: 149 mg/dL — AB (ref 70–99)
Glucose-Capillary: 96 mg/dL (ref 70–99)

## 2013-04-04 LAB — CBC
HEMATOCRIT: 30.2 % — AB (ref 36.0–46.0)
Hemoglobin: 9.8 g/dL — ABNORMAL LOW (ref 12.0–15.0)
MCH: 31.4 pg (ref 26.0–34.0)
MCHC: 32.5 g/dL (ref 30.0–36.0)
MCV: 96.8 fL (ref 78.0–100.0)
Platelets: 170 10*3/uL (ref 150–400)
RBC: 3.12 MIL/uL — ABNORMAL LOW (ref 3.87–5.11)
RDW: 12.7 % (ref 11.5–15.5)
WBC: 6.2 10*3/uL (ref 4.0–10.5)

## 2013-04-04 LAB — PROTIME-INR
INR: 1.15 (ref 0.00–1.49)
Prothrombin Time: 14.5 seconds (ref 11.6–15.2)

## 2013-04-04 SURGERY — ANGIOGRAM, LOWER EXTREMITY
Anesthesia: LOCAL | Laterality: Left

## 2013-04-04 MED ORDER — NITROGLYCERIN 0.2 MG/ML ON CALL CATH LAB
INTRAVENOUS | Status: AC
  Filled 2013-04-04: qty 1

## 2013-04-04 MED ORDER — ASPIRIN EC 325 MG PO TBEC
325.0000 mg | DELAYED_RELEASE_TABLET | Freq: Every day | ORAL | Status: DC
  Administered 2013-04-04 – 2013-04-05 (×2): 325 mg via ORAL
  Filled 2013-04-04 (×2): qty 1

## 2013-04-04 MED ORDER — VERAPAMIL HCL 2.5 MG/ML IV SOLN
INTRAVENOUS | Status: AC
  Filled 2013-04-04: qty 2

## 2013-04-04 MED ORDER — FAMOTIDINE IN NACL 20-0.9 MG/50ML-% IV SOLN
INTRAVENOUS | Status: AC
  Filled 2013-04-04: qty 50

## 2013-04-04 MED ORDER — SODIUM CHLORIDE 0.9 % IV SOLN: INTRAVENOUS | Status: AC

## 2013-04-04 MED ORDER — MORPHINE SULFATE 2 MG/ML IJ SOLN
INTRAMUSCULAR | Status: AC
  Filled 2013-04-04: qty 1

## 2013-04-04 MED ORDER — FENTANYL CITRATE 0.05 MG/ML IJ SOLN
INTRAMUSCULAR | Status: AC
  Filled 2013-04-04: qty 2

## 2013-04-04 MED ORDER — CLOPIDOGREL BISULFATE 300 MG PO TABS
ORAL_TABLET | ORAL | Status: AC
  Filled 2013-04-04: qty 1

## 2013-04-04 MED ORDER — HEPARIN SODIUM (PORCINE) 1000 UNIT/ML IJ SOLN
INTRAMUSCULAR | Status: AC
  Filled 2013-04-04: qty 1

## 2013-04-04 MED ORDER — HEPARIN (PORCINE) IN NACL 2-0.9 UNIT/ML-% IJ SOLN
INTRAMUSCULAR | Status: AC
  Filled 2013-04-04: qty 1000

## 2013-04-04 MED ORDER — MORPHINE SULFATE 2 MG/ML IJ SOLN
1.0000 mg | INTRAMUSCULAR | Status: DC | PRN
  Administered 2013-04-04 (×3): 1 mg via INTRAVENOUS
  Filled 2013-04-04 (×2): qty 1

## 2013-04-04 MED ORDER — LIDOCAINE HCL (PF) 1 % IJ SOLN
INTRAMUSCULAR | Status: AC
  Filled 2013-04-04: qty 30

## 2013-04-04 NOTE — Progress Notes (Signed)
UR completed. Patient changed to inpatient- requiring IVF @ 75cc/hr

## 2013-04-04 NOTE — Telephone Encounter (Addendum)
Message copied by Gena Fray on Mon Apr 04, 2013  2:20 PM ------      Message from: Denman George      Created: Mon Apr 04, 2013 10:48 AM      Regarding: FW: schedule       Sched. OV with CSD w/in 1-2 weeks; order placed for vein mapping.            ----- Message -----         From: Angelia Mould, MD         Sent: 04/04/2013  10:27 AM           To: [facility] Charge Pool      Subject: schedule                                                 Dr. Gwenlyn Found would like me to see this patient in the office in the next 1-2 weeks. If possible I will need her op note from AAA repair by Dr. Amedeo Plenty. She will also need a vein map of the Left LE.      Thanks      CD       ------  04/04/13: lm for pt re appt, dpm

## 2013-04-04 NOTE — Consult Note (Signed)
WOC wound consult note Reason for Consult: evaluation of left heel. She has small left heel, left lateral heel fissure.  Left medial first metatarsal head fissures x 3.  She has been treated in the wound care center for some time. Daughter reports use of enzymatic debridement ointment, alginates, offloading boots.  Wound type: arterial and component of pressure to the left heel Pressure Ulcer POA: Yes Measurement: heel: 0.5cm x 0.5cm other areas are all fissures Wound bed: dark, necrotic  Drainage (amount, consistency, odor) none Periwound: great toe with erythema and streaking  Dressing procedure/placement/frequency: Paint areas with betadine, cover with dry dressing, wrap with kerlix. Change daily.   Discussed POC with patient and bedside nurse.  Re consult if needed, will not follow at this time. Thanks  Alexie Samson Kellogg, [place] [phone])

## 2013-04-04 NOTE — CV Procedure (Signed)
Suzanne [name] is a 80 y.o. female    [id number] LOCATION:  FACILITY: [facility]  PHYSICIAN: Quay Burow, M.D. [birth year]   DATE OF PROCEDURE:  04/04/2013  DATE OF DISCHARGE:     PV Angiogram/Intervention    History obtained from chart review.The patient is an 80 year old, thin and frail-appearing, widowed Caucasian female, mother of 1 daughter Suzanne [name]) who accompanies her today. I last saw her approximately 8 months ago. She has a history of mild CAD by cath which I performed October 12, 2008. Her EF at that time was 35% to 40% and by echo her EF is normal done February 16, 997, with diastolic dysfunction. She has had episodes of CHF exacerbation. Her other problems include COPD on home oxygen, hypertension and hyperlipidemia and peripheral vascular occlusive disease. She has had aortobifemoral bypass grafting remotely as well as fem-peroneal bypass grafting. I last angiogram'd her February 02, 1998, revealing patient aortobifemoral and a patent right fem-peroneal bypass graft with a high-grade lesion beyond the graft insertion. She had 1-vessel runoff on the right, 2 on the left with high-grade distal left SFA disease on the left and bilateral renal artery stenosis. She denies chest pain but is chronically short of breath. She has developed an open wound on her left heel and is going to the wound care center. Her last Doppler of her lower extremities performed a year ago revealed a right ABI of 0.75 with a patent fem-peroneal bypass graft, an occluded right SFA with 1-vessel runoff and high-grade left common femoral and SFA disease. She also has moderate renal insufficiency followed by Dr. Erling Cruz. Her major complaints are shortness of breath. She is on home O2. She is scheduled to see Dr. Everlene Farrier in the office next week for continued evaluation. She denies chest pain.  I last saw her in the office 11/12/12. Since that time she has been admitted for urosepsis. She has progressive critical limb  ischemia in her right foot with chronic pain requiring narcotics. After long discussion with the patient and her daughter Suzanne [name] we have decided to proceed with angiography potential intervention. I'm going to get a 2-D echo and lower extremity arterial Doppler studies. We will stop her Lasix and Cozaar prior to the procedure as well as her Coumadin her Coumadin the night before for hydration.    PROCEDURE DESCRIPTION:   The patient was brought to the second floor [facility] cath lab in the postabsorptive state. She was premedicated with Valium 5 mg by mouth. Her right groinwas prepped and shaved in usual sterile fashion. Xylocaine 1% was used  for local anesthesia. A 5 French sheath was inserted into the right common femoral artery using standard Seldinger technique/ using a micropuncture needle. A 5 French reverse curve Mortajime  catheter was used to perform left lower extremity angiography using bolus chase digital subtraction settable technique. A total of 63 cc of contrast was used for the procedure.   HEMODYNAMICS:    AO SYSTOLIC/AO DIASTOLIC: 338/25   Angiographic Data:   1: Left lower extremity-there was a 95% calcific plaque in the left common femoral artery to just distal to the distal anastomosis of the left limb of the aortobifemoral bypass graft. There was a 50% segmental mid left SFA, tandem 95% stenoses in the distal left SFA and above-the-knee popliteal artery. P2 and P3 segments appeared free of significant disease. There is 1 vessel runoff via the anterior tibial.  IMPRESSION:Suzanne [name] has a high-grade calcific lesion in her left common femoral artery  was treated with endarterectomy and patch angioplasty. I have reviewed her antibiotics with Dr. Scot Dock who agrees with this approach. In addition, he will see her back in the office, vein mapping her and determine whether or not she is a candidate for simultaneous left femoropopliteal bypass grafting for wound healing in the  setting of critical limb ischemia. The sheath will be removed and pressure will be held on the groin to achieve hemostasis. The patient left the lab in stable condition. We will keep her overnight, hydrate her and discharge her him come in the morning.     Lorretta Harp MD, [facility] 04/04/2013 10:24 AM

## 2013-04-04 NOTE — Progress Notes (Signed)
INITIAL NUTRITION ASSESSMENT  DOCUMENTATION CODES Per approved criteria  -Not Applicable   INTERVENTION: No nutrition intervention at this time --- patient declined RD to follow for nutrition care plan  NUTRITION DIAGNOSIS: Increased nutrient needs related to wound healing as evidenced by estimated nutrition needs  Goal: Pt to meet >/= 90% of their estimated nutrition needs   Monitor:  PO & supplemental intake, weight, labs, I/O's  Reason for Assessment: Malnutrition Screening Tool Report  80 y.o. female  Admitting Dx: peripheral vascular disease  ASSESSMENT: Patient with PMH of CHF and DM admitted for planned hydration prior to angiography 2/16; developed open wound on left heel; patient is on home O2 and has progressive critical ischemia and chronic pain.  Patient s/p procedure 2/16: PV ANGIOGRAM  Patient reports a good appetite at this time and PTA; daughter at bedside; noted patient with Stage II pressure ulcer to heel; patient with increased protein needs -- RD offered nutrition supplements, however, patient declined.  Height: Ht Readings from Last 1 Encounters:  04/03/13 5\' 2"  (1.575 m)    Weight: Wt Readings from Last 1 Encounters:  04/03/13 147 lb 12.8 oz (67.042 kg)    Ideal Body Weight: 110 lb  % Ideal Body Weight: 133%  Wt Readings from Last 10 Encounters:  04/03/13 147 lb 12.8 oz (67.042 kg)  04/03/13 147 lb 12.8 oz (67.042 kg)  03/24/13 141 lb 8 oz (64.184 kg)  02/05/13 139 lb (63.05 kg)  01/27/13 147 lb 14.4 oz (67.087 kg)  01/25/13 142 lb 8 oz (64.638 kg)  11/16/12 145 lb 8 oz (65.998 kg)  11/12/12 146 lb (66.225 kg)  08/31/12 145 lb (65.772 kg)  06/08/12 146 lb (66.225 kg)    Usual Body Weight: 145 lb  % Usual Body Weight: 101%  BMI:  Body mass index is 27.03 kg/(m^2).  Estimated Nutritional Needs: Kcal: 1700-1900 Protein: 80-90 gm Fluid: 1.7-1.9 L  Skin: Stage II pressure ulcer to L heel  Diet Order: Carb Control  EDUCATION  NEEDS: -No education needs identified at this time   Intake/Output Summary (Last 24 hours) at 04/04/13 1208 Last data filed at 04/04/13 0855  Gross per 24 hour  Intake      0 ml  Output    800 ml  Net   -800 ml    Labs:   Recent Labs Lab 04/03/13 1708 04/04/13 0450  NA 137 142  K 4.5 4.7  CL 102 108  CO2 23 23  BUN 43* 36*  CREATININE 1.90* 1.93*  CALCIUM 9.0 9.3  GLUCOSE 167* 139*    CBG (last 3)   Recent Labs  04/03/13 2138 04/04/13 0614 04/04/13 1021  GLUCAP 106* 96 80    Scheduled Meds: . aspirin EC  325 mg Oral Daily  . atorvastatin  40 mg Oral q1800  . budesonide-formoterol  2 puff Inhalation BID  . ferrous sulfate  325 mg Oral QHS  . insulin glargine  16 Units Subcutaneous QHS  . metoprolol succinate  25 mg Oral Daily  . pantoprazole  80 mg Oral Q1200    Continuous Infusions: . sodium chloride 450 mL (04/04/13 1108)    Past Medical History  Diagnosis Date  . Type II or unspecified type diabetes mellitus without mention of complication, not stated as uncontrolled   . PVD (peripheral vascular disease)     PTA & stent right superficial artery PTA & left common iliac artery stenting. Right fem-pop bypass graft  . Mixed hyperlipidemia   . Renal  artery stenosis   . Compression fx, thoracic spine     T12  . Ischium fracture   . Memory loss, short term   . Pulmonary nodule   . CAD (coronary artery disease) 10/12/2008    :30% LAD,30% CX & 40% tandem mid stenosis in the AV groove, 50-60% RCA  . CHF (congestive heart failure) 09-2008  . Anemia   . COPD (chronic obstructive pulmonary disease)   . Pneumonia   . Detached retina 02-2005    R EYE  . Fracture 02-2007    L5 AND PELVIS  . COPD (chronic obstructive pulmonary disease) 03/18/2011  . Left ventricular dysfunction     Moderate by cath,EF approx. 40% with mod. global hypokinesis  . AAA (abdominal aortic aneurysm)     Repair unknown date  . Critical lower limb ischemia     Past Surgical  History  Procedure Laterality Date  . Aorta surgery      aortic anyuersm surgery.   . Vessel surgery      S/P aortobifemoral bypass grafting and right fem-peroneal bypass grafting remotely  . Cholecystectomy    . Cataract surgery  06-2006    R EYE  . Vascular surgery    . Cardiac catheterization      Arthur Holms, RD, LDN Pager #: [phone] After-Hours Pager #: [phone]

## 2013-04-05 ENCOUNTER — Encounter (HOSPITAL_COMMUNITY): Payer: Self-pay | Admitting: Physician Assistant

## 2013-04-05 ENCOUNTER — Other Ambulatory Visit: Payer: Self-pay | Admitting: Physician Assistant

## 2013-04-05 DIAGNOSIS — I999 Unspecified disorder of circulatory system: Secondary | ICD-10-CM

## 2013-04-05 DIAGNOSIS — N184 Chronic kidney disease, stage 4 (severe): Secondary | ICD-10-CM

## 2013-04-05 LAB — CBC
HCT: 30.3 % — ABNORMAL LOW (ref 36.0–46.0)
HEMOGLOBIN: 10 g/dL — AB (ref 12.0–15.0)
MCH: 31.9 pg (ref 26.0–34.0)
MCHC: 33 g/dL (ref 30.0–36.0)
MCV: 96.8 fL (ref 78.0–100.0)
PLATELETS: 159 10*3/uL (ref 150–400)
RBC: 3.13 MIL/uL — AB (ref 3.87–5.11)
RDW: 12.7 % (ref 11.5–15.5)
WBC: 6.8 10*3/uL (ref 4.0–10.5)

## 2013-04-05 LAB — BASIC METABOLIC PANEL
BUN: 31 mg/dL — ABNORMAL HIGH (ref 6–23)
CO2: 23 meq/L (ref 19–32)
CREATININE: 1.68 mg/dL — AB (ref 0.50–1.10)
Calcium: 9.4 mg/dL (ref 8.4–10.5)
Chloride: 108 mEq/L (ref 96–112)
GFR calc Af Amer: 30 mL/min — ABNORMAL LOW (ref >90)
GFR calc non Af Amer: 26 mL/min — ABNORMAL LOW (ref >90)
GLUCOSE: 123 mg/dL — AB (ref 70–99)
Potassium: 4.6 mEq/L (ref 3.7–5.3)
Sodium: 143 mEq/L (ref 137–147)

## 2013-04-05 LAB — GLUCOSE, CAPILLARY: Glucose-Capillary: 92 mg/dL (ref 70–99)

## 2013-04-05 MED ORDER — LOSARTAN POTASSIUM 50 MG PO TABS
100.0000 mg | ORAL_TABLET | Freq: Every day | ORAL | Status: DC
  Administered 2013-04-05: 10:00:00 100 mg via ORAL
  Filled 2013-04-05: qty 2

## 2013-04-05 MED ORDER — ASPIRIN 325 MG PO TBEC: 325.0000 mg | DELAYED_RELEASE_TABLET | Freq: Every day | ORAL | Status: DC

## 2013-04-05 NOTE — Progress Notes (Signed)
  Patient: Suzanne [name] / Admit Date: 04/03/2013 / Date of Encounter: 04/05/2013, 7:23 AM   Subjective  No complaints.  Objective   Telemetry: NSR  Physical Exam: Blood pressure 163/53, pulse 94, temperature 97.7 F (36.5 C), temperature source Oral, resp. rate 18, height 5\' 2"  (1.575 m), weight 148 lb 13 oz (67.5 kg), SpO2 93.00%. General: Well developed, well nourished, in no acute distress. Head: Normocephalic, atraumatic, sclera non-icteric, no xanthomas, nares are without discharge. Neck: JVP not elevated. Lungs: Clear bilaterally to auscultation without wheezes, rales, or rhonchi. Breathing is unlabored. Heart: RRR S1 S2 without murmurs, rubs, or gallops.  Abdomen: Soft, non-tender, non-distended with normoactive bowel sounds. No rebound/guarding. Extremities: No clubbing or cyanosis. LLE wrapped. R groin site without bruit, ecchymosis or hematoma. Neuro: Alert and oriented X 3. Moves all extremities spontaneously. Psych:  Responds to questions appropriately with a normal affect.   Intake/Output Summary (Last 24 hours) at 04/05/13 0723 Last data filed at 04/05/13 0430  Gross per 24 hour  Intake    355 ml  Output   2250 ml  Net  -1895 ml    Inpatient Medications:  . aspirin EC  325 mg Oral Daily  . atorvastatin  40 mg Oral q1800  . budesonide-formoterol  2 puff Inhalation BID  . ferrous sulfate  325 mg Oral QHS  . insulin glargine  16 Units Subcutaneous QHS  . metoprolol succinate  25 mg Oral Daily  . pantoprazole  80 mg Oral Q1200   Infusions:    Labs:  Recent Labs  04/04/13 0450 04/05/13 0525  NA 142 143  K 4.7 4.6  CL 108 108  CO2 23 23  GLUCOSE 139* 123*  BUN 36* 31*  CREATININE 1.93* 1.68*  CALCIUM 9.3 9.4   No results found for this basename: AST, ALT, ALKPHOS, BILITOT, PROT, ALBUMIN,  in the last 72 hours  Recent Labs  04/04/13 0450 04/05/13 0525  WBC 6.2 6.8  HGB 9.8* 10.0*  HCT 30.2* 30.3*  MCV 96.8 96.8  PLT 170 159     Radiology/Studies:  No results found.   Assessment and Plan  1. PAD - h/o prior bypass grafting. MD to clarify from PV note if intervention was performed. She has f/u with Dr. Scot Dock 2/25. 2. CKD - appears stage IV - Cr stable post-cath. 3. COPD/chronic respiratory failure on home O2 - f/u PCP as planned. 4. Chronic diastolic CHF - see below. 5. HTN - consider resumption of home ARB and Lasix. 6. Anemia - stable. 7. Chronic Coumadin therapy - DC summary from 2010 reports she is "on chronic  Coumadin for vascular disease." This is listed on her med list going back to 2006 by records available. Teaching service H&P remotely says she has h/o DVT but I cannot find any evidence of this. This admission's H&P suggests history of atrial fib but all ECGs appear sinus and there is no mention of this previously. Will defer to MD regarding decision of Coumadin resumption, noting that full dose aspirin has also been added this admission.  Also - request that MD clarify need for antibiotics per PV angio note as I do not see these ordered.  Signed, Melina Copa PA-C   Patient seen and examined. Agree with assessment and plan. Doing well, s/p PV angiogram.  Will resume coumadin home dose. DC today and f/u Dr. Gwenlyn Found and subsequent vein mapping.   Troy Sine, MD, [facility] 04/05/2013 8:13 AM

## 2013-04-05 NOTE — Discharge Summary (Signed)
Discharge Summary   Patient ID: Suzanne [name] MRN: [id number], DOB/AGE: December 24, 1921 80 y.o. Admit date: 04/03/2013 D/C date:     04/05/2013  Primary Care Provider: Jenny Reichmann, MD Primary Cardiologist: Gwenlyn Found  Primary Discharge Diagnoses:  1. PAD  - high-grade calcific lesion in her left common femoral artery, planned vascular followup to discuss further intervention   - h/o prior PTCA, aortobifemoral bypass grafting remotely as well as fem-peroneal bypass grafting 2. CKD, stage IV 3. COPD/chronic respiratory failure on home O2  4. Chronic diastolic CHF 5. HTN 6. Anemia 7. Prior Coumadin therapy for vascular disease - discontinued this admission  Secondary Discharge Diagnoses:  1. CAD, nonobstructive by cath 2010 2. Hyperlipidemia 3. Diabetes mellitus 4. Renal artery stenosis (1-59% by doppler in 2014) 5. Compression fx T spine 6. Ischium fracture 7. Memory loss, short-term 8. Pulmonary nodule 9. Detached retina R eye 2007 10. Fracture L5 and pelvis 2009 11. AAA s/p repair  Hospital Course: Suzanne [name] is an 80 y/o F with history of DM, HTN, CKD stage IV, COPD with chronic resp failure on home O2, chronic anemia, renal artery stenosis, nonobstructive CAD, and PAD who presented to [facility] on [facility]/03/2013 for hydration in prep for PV angio to evaluate critical limb ischemia. She has a history of mild CAD by cath August 2010. Her EF at that time was 35% to 40% and followup echoes have demonstrated normalizaiton of EF. Per chart she has history of PTA & stent right superficial artery, PTA & left common iliac artery stenting, and h/o aortobifemoral bypass grafting remotely as well as fem-peroneal bypass grafting. She denies chest pain but is chronically short of breath. She has developed an open wound on her left heel and is going to the wound care center. Her last Doppler of her lower extremities performed a year ago revealed a right ABI of 0.75 with a patent fem-peroneal bypass graft, an  occluded right SFA with 1-vessel runoff and high-grade left common femoral and SFA disease. She has progressive critical limb ischemia in her right foot with chronic pain requiring narcotics, warranting angiography. Coumadin, Lasix, and losartan were held in prep for this procedure.  She underwent PV angio 04/04/13 showing: 1: Left lower extremity-there was a 95% calcific plaque in the left common femoral artery to just distal to the distal anastomosis of the left limb of the aortobifemoral bypass graft. There was a 50% segmental mid left SFA, tandem 95% stenoses in the distal left SFA and above-the-knee popliteal artery. P2 and P3 segments appeared free of significant disease. There is 1 vessel runoff via the anterior tibial. Dr. Gwenlyn Found felt this would best be treated with endarterectomy and patch angioplasty. He reviewed with Dr. Scot Dock who agrees with this approach. Dr. Scot Dock will see her back in the office, vein mapping and to determine whether or not she is a candidate for simultaneous left femoropopliteal bypass grafting for wound healing. She was hydrated overnight. Hemoglobin remained stable and Cr remained stable - pre-PV value was 1.93, today it is 1.68. Lasix and ARB will be restarted. Dr. Claiborne Billings has seen and examined the patient today and feels he is stable for discharge.  I discussed anticoagulation with Dr. Gwenlyn Found. Review of chart indicated that she had previously been on chronic Coumadin for vascular disease per discharge summary in 2010. This is listed on her med list going back to at least 2006. Teaching service H&P remotely says she has h/o DVT but there is no evidence of this ever  being diagnosed. The patient denies history of such. This admission's H&P erroneously suggested history of atrial fib but all ECGs appear sinus and she has never had documented atrial fibrillation. Dr. Gwenlyn Found feels she no longer requires Coumadin and recommends to continue full dose aspirin only.  I left a message  on the [facility] scheduling voicemail for the following - to f/u with Dr. Gwenlyn Found 2 weeks and to cancel future INR appointment. The patient was also given a lab slip to take to the lab on Thursday 2/17 to ensure stability of Cr given significant CKD stage IV.   Discharge Vitals: Blood pressure 172/61, pulse 84, temperature 98.3 F (36.8 C), temperature source Oral, resp. rate 18, height 5\' 2"  (1.575 m), weight 148 lb 13 oz (67.5 kg), SpO2 96.00%. will get AM BP meds including resumption of ARB before dc.   Labs: Lab Results  Component Value Date   WBC 6.8 04/05/2013   HGB 10.0* 04/05/2013   HCT 30.3* 04/05/2013   MCV 96.8 04/05/2013   PLT 159 04/05/2013     Recent Labs Lab 04/05/13 0525  NA 143  K 4.6  CL 108  CO2 23  BUN 31*  CREATININE 1.68*  CALCIUM 9.4  GLUCOSE 123*    Lab Results  Component Value Date   HDL 47 08/01/2011   LDLCALC 48 08/01/2011   TRIG 121 08/01/2011    Diagnostic Studies/Procedures   PV angio 04/04/13 PROCEDURE DESCRIPTION:  The patient was brought to the second floor [place] [facility] cath lab in the postabsorptive state. She was premedicated with Valium 5 mg by mouth. Her right groinwas prepped and shaved in usual sterile fashion. Xylocaine 1% was used  for local anesthesia. A 5 French sheath was inserted into the right common femoral artery using standard Seldinger technique/ using a micropuncture needle. A 5 French reverse curve Mortajime catheter was used to perform left lower extremity angiography using bolus chase digital subtraction settable technique. A total of 63 cc of contrast was used for the procedure.  HEMODYNAMICS:  AO SYSTOLIC/AO DIASTOLIC: 762/83  Angiographic Data:  1: Left lower extremity-there was a 95% calcific plaque in the left common femoral artery to just distal to the distal anastomosis of the left limb of the aortobifemoral bypass graft. There was a 50% segmental mid left SFA, tandem 95% stenoses in the distal left SFA and  above-the-knee popliteal artery. P2 and P3 segments appeared free of significant disease. There is 1 vessel runoff via the anterior tibial.  IMPRESSION:Suzanne [name] has a high-grade calcific lesion in her left common femoral artery was treated with endarterectomy and patch angioplasty. I have reviewed her antibiotics with Dr. Scot Dock who agrees with this approach. In addition, he will see her back in the office, vein mapping her and determine whether or not she is a candidate for simultaneous left femoropopliteal bypass grafting for wound healing in the setting of critical limb ischemia. The sheath will be removed and pressure will be held on the groin to achieve hemostasis. The patient left the lab in stable condition. We will keep her overnight, hydrate her and discharge her him come in the morning.  Lorretta Harp MD, [facility]  04/04/2013  10:24 AM    Discharge Medications     Medication List    STOP taking these medications       warfarin 2.5 MG tablet  Commonly known as:  COUMADIN      TAKE these medications       acetaminophen 500 MG  tablet  Commonly known as:  TYLENOL  Take 1,000 mg by mouth every 4 (four) hours as needed (pain).     albuterol (2.5 MG/3ML) 0.083% nebulizer solution  Commonly known as:  PROVENTIL  Take 2.5 mg by nebulization See admin instructions. Use twice daily and a third time as needed for shortness of breath or wheezing     aspirin 325 MG EC tablet  Take 1 tablet (325 mg total) by mouth daily.     budesonide-formoterol 160-4.5 MCG/ACT inhaler  Commonly known as:  SYMBICORT  Inhale 2 puffs into the lungs 2 (two) times daily.     CALCIUM 600 + D PO  Take 600 mg by mouth daily.     COMBIVENT RESPIMAT 20-100 MCG/ACT Aers respimat  Generic drug:  Ipratropium-Albuterol  Inhale 2 puffs into the lungs every 6 (six) hours as needed for wheezing or shortness of breath.     esomeprazole 40 MG capsule  Commonly known as:  NEXIUM  Take 40 mg by mouth daily  before breakfast.     ferrous sulfate 325 (65 FE) MG tablet  Take 325 mg by mouth at bedtime.     Fish Oil 1200 MG Caps  Take 1,200 mg by mouth 2 (two) times daily.     furosemide 40 MG tablet  Commonly known as:  LASIX  Take 40 mg by mouth daily.     HYDROcodone-acetaminophen 5-325 MG per tablet  Commonly known as:  NORCO/VICODIN  Take 1 tablet by mouth every 6 (six) hours as needed for moderate pain.     ibandronate 150 MG tablet  Commonly known as:  BONIVA  Take 150 mg by mouth every 30 (thirty) days. Take in the morning with a full glass of water, on an empty stomach, and do not take anything else by mouth or lie down for the next 30 min. (take on the 25th of each month)     Insulin Glargine 100 UNIT/ML Solostar Pen  Commonly known as:  LANTUS  Inject 16 Units into the skin daily at 10 pm.     lidocaine 5 %  Commonly known as:  LIDODERM  Place 1 patch onto the skin daily. Remove & Discard patch within 12 hours or as directed by MD     losartan 100 MG tablet  Commonly known as:  COZAAR  Take 100 mg by mouth daily.     metoprolol succinate 25 MG 24 hr tablet  Commonly known as:  TOPROL-XL  Take 25 mg by mouth daily.     multivitamin with minerals Tabs tablet  Take 1 tablet by mouth at bedtime.     ranitidine 150 MG tablet  Commonly known as:  ZANTAC  Take 150 mg by mouth 2 (two) times daily.     rosuvastatin 20 MG tablet  Commonly known as:  CRESTOR  Take 20 mg by mouth daily. PATIENT NEEDS OFFICE VISIT/LABS FOR ADDITIONAL REFILLS        Disposition   The patient will be discharged in stable condition to home. Discharge Orders   Future Appointments Provider Department Dept Phone   04/12/2013 3:15 PM Darlyne Russian, MD [facility] [phone]   04/13/2013 3:00 PM [facility] [place] [facility] [address] [phone]   04/13/2013 3:30 PM Angelia Mould, MD [facility] [phone]   Future Orders  Complete By Expires   Diet - low sodium heart healthy  As directed    Increase activity slowly  As directed    Scheduling Instructions:     No driving for 2 days unless otherwise directed. No lifting over 5 lbs for 1 week. No sexual activity for 1 week. Keep procedure site clean & dry. If you notice increased pain, swelling, bleeding or pus, call/return!  You may shower, but no soaking baths/hot tubs/pools for 1 week.     Follow-up Information   Follow up with Angelia Mould, MD On 04/13/2013. (His office should be contacting you for further instructions and your appointment times. If you have not heard from them by Friday please call their office.)    Specialty:  Vascular Surgery   Contact information:   [address] [place] [address] [phone]       Follow up with [facility]. (Please take lab slip to [facility] lab on Thursday 2/19 to make sure your kidney function stayed stable. (Bottom floor of the same building your heart doctor is in). They are open from 8am-5pm.)    Contact information:   [address] [place],  [address] [phone] (Phone) 8:00am-5:00pm      Follow up with Lorretta Harp, MD. (Our office will call you for a follow-up appointment. Please call the office if you have not heard from [place] within 3 days.)    Specialty:  Cardiology   Contact information:   [address] [place] [address] [phone]         Duration of Discharge Encounter: Greater than 30 minutes including physician and PA time.  Signed, Cloie Wooden PA-C 04/05/2013, 10:09 AM    .

## 2013-04-07 ENCOUNTER — Other Ambulatory Visit: Payer: Self-pay | Admitting: Cardiovascular Disease

## 2013-04-07 LAB — BASIC METABOLIC PANEL
BUN: 31 mg/dL — ABNORMAL HIGH (ref 6–23)
CHLORIDE: 104 meq/L (ref 96–112)
CO2: 26 mEq/L (ref 19–32)
Calcium: 9 mg/dL (ref 8.4–10.5)
Creat: 1.59 mg/dL — ABNORMAL HIGH (ref 0.50–1.10)
Glucose, Bld: 118 mg/dL — ABNORMAL HIGH (ref 70–99)
Potassium: 4.6 mEq/L (ref 3.5–5.3)
SODIUM: 140 meq/L (ref 135–145)

## 2013-04-12 ENCOUNTER — Encounter: Payer: Self-pay | Admitting: Vascular Surgery

## 2013-04-12 ENCOUNTER — Ambulatory Visit (INDEPENDENT_AMBULATORY_CARE_PROVIDER_SITE_OTHER): Payer: Medicare Other | Admitting: Emergency Medicine

## 2013-04-12 ENCOUNTER — Ambulatory Visit: Payer: Medicare Other | Admitting: Emergency Medicine

## 2013-04-12 ENCOUNTER — Encounter: Payer: Self-pay | Admitting: Emergency Medicine

## 2013-04-12 VITALS — BP 148/78 | HR 73 | Temp 97.7°F | Resp 16 | Wt 142.5 lb

## 2013-04-12 DIAGNOSIS — E119 Type 2 diabetes mellitus without complications: Secondary | ICD-10-CM

## 2013-04-12 DIAGNOSIS — R29898 Other symptoms and signs involving the musculoskeletal system: Secondary | ICD-10-CM

## 2013-04-12 LAB — GLUCOSE, POCT (MANUAL RESULT ENTRY): POC GLUCOSE: 120 mg/dL — AB (ref 70–99)

## 2013-04-12 LAB — POCT GLYCOSYLATED HEMOGLOBIN (HGB A1C): HEMOGLOBIN A1C: 6.6

## 2013-04-12 MED ORDER — HYDROCODONE-ACETAMINOPHEN 7.5-325 MG PO TABS: 1.0000 | ORAL_TABLET | ORAL | Status: DC | PRN

## 2013-04-12 NOTE — Progress Notes (Addendum)
Subjective:    Patient ID: Suzanne [name], female    DOB: 28-Feb-1921, 80 y.o.   MRN: [id number] This chart was scribed for Darlyne Russian, MD by Anastasia Pall, ED Scribe. This patient was seen in room 21 and the patient's care was started at 3:30 PM.  Chief Complaint  Patient presents with  . 2 month check up  . Medication Refill    hydrocodone   HPI Suzanne [name] is a 80 y.o. female Pt presents for 2 month check up and Hydrocodone refill. She has h/o DM type 2 and vascular disease.   Daughter reports pt has 95% blockage in left femoral artery. Pt reports having vascular surgery on opposite side 1997-98. Daughter states that when blockage moved, pt started getting new skin splits, ulcers over her heels. Daughter is unsure what ointment she should use for the wounds.   She states Tramadol did not work for pt. States pt started taking Hydrocodone, but they had a hard time getting doctor to rewrite prescription. Pt takes 6 Hydrocodone every 24 hours, also takes Aspirin. Pt takes Murelax.   States does not have surgery scheduled yet. Needs to see Dr. Ferdie Ping for vein mapping and other test and has another appointment with different MD 03/04.   Daughter stays with pt at her apartment. Daughter states they have friends that own a health care center, will consult them for help regarding pt's care. Daughter is wondering about prescription shower chair.   PCP - Jenny Reichmann, MD  Patient Active Problem List   Diagnosis Date Noted  . Critical lower limb ischemia 04/04/2013  . Claudication 04/03/2013  . UTI (lower urinary tract infection) 01/25/2013  . Urosepsis 01/25/2013  . Long term (current) use of anticoagulants 05/12/2012  . PAD (peripheral artery disease) 03/03/2012  . Liver cyst 02/26/2012  . Cyst, kidney, acquired 02/26/2012  . Kidney stone 02/26/2012  . Chronic kidney disease 11/19/2011  . COPD (chronic obstructive pulmonary disease) 03/18/2011  . Osteoporosis 12/17/2010  .  Type II or unspecified type diabetes mellitus without mention of complication, not stated as uncontrolled   . PVD (peripheral vascular disease)   . Mixed hyperlipidemia   . Renal artery stenosis   . Memory loss, short term   . Pulmonary nodule   . CAD (coronary artery disease)   . CHF (congestive heart failure)   . Anemia    Past Medical History  Diagnosis Date  . Type II or unspecified type diabetes mellitus without mention of complication, not stated as uncontrolled   . PVD (peripheral vascular disease)     a. PTA & stent right superficial artery PTA & left common iliac artery stenting. Right fem-pop bypass graft. b. Per Dr. Kennon Holter note: h/o aortobifemoral bypass grafting remotely as well as fem-peroneal bypass grafting.  . Mixed hyperlipidemia   . Renal artery stenosis     a. 1-59% by doppler in 2014.  [name] Compression fx, thoracic spine     T12  . Ischium fracture   . Memory loss, short term   . Pulmonary nodule   . CAD (coronary artery disease) 10/12/2008    a. 30% LAD,30% CX & 40% tandem mid stenosis in the AV groove, 50-60% RCA  . Chronic diastolic CHF (congestive heart failure) 09-2008    a. EF 40% by cath 2010. b. Improved - last echo 2015 with normal EF.  [name] Anemia   . COPD (chronic obstructive pulmonary disease)   . Detached retina 02-2005  R EYE  . Fracture 02-2007    L5 AND PELVIS  . COPD (chronic obstructive pulmonary disease)   . Left ventricular dysfunction     a. EF 40% by cath 2010. b. Improved - last echo 2015 with normal EF.   [name] AAA (abdominal aortic aneurysm)     Repair unknown date  . Critical lower limb ischemia   . Chronic respiratory failure     a. On home O2.  [name] Hypertension   . Warfarin anticoagulation     DC summary from 2010 reports she is "on chronic  Coumadin for vascular disease." This is listed on her med list going back to 2006 by records available. Teaching service H&P remotely says she has h/o DVT but I cannot find any evidence of this.  Admission H/P 03/2013 suggests history of atrial fib but all ECGs appear sinus and there is no mention of this previously.   Past Surgical History  Procedure Laterality Date  . Aorta surgery      aortic anyuersm surgery.   . Vessel surgery      S/P aortobifemoral bypass grafting and right fem-peroneal bypass grafting remotely  . Cholecystectomy    . Cataract surgery  06-2006    R EYE  . Vascular surgery    . Cardiac catheterization     Allergies  Allergen Reactions  . Codeine Other (See Comments)    hallucinations  . Hydrochlorothiazide Other (See Comments)    hypotension  . Other     Adhesives and bandages gives pt a rash.  . Pregabalin Other (See Comments)    unknown   Prior to Admission medications   Medication Sig Start Date End Date Taking? Authorizing Provider  albuterol (PROVENTIL) (2.5 MG/3ML) 0.083% nebulizer solution Take 2.5 mg by nebulization See admin instructions. Use twice daily and a third time as needed for shortness of breath or wheezing   Yes Historical Provider, MD  aspirin EC 325 MG EC tablet Take 1 tablet (325 mg total) by mouth daily. 04/05/13  Yes Dayna N Dunn, PA-C  budesonide-formoterol (SYMBICORT) 160-4.5 MCG/ACT inhaler Inhale 2 puffs into the lungs 2 (two) times daily. 08/31/12  Yes Darlyne Russian, MD  Calcium Carb-Cholecalciferol (CALCIUM 600 + D PO) Take 600 mg by mouth daily.    Yes Historical Provider, MD  esomeprazole (NEXIUM) 40 MG capsule Take 40 mg by mouth daily before breakfast. 08/31/12  Yes Darlyne Russian, MD  ferrous sulfate 325 (65 FE) MG tablet Take 325 mg by mouth at bedtime.   Yes Historical Provider, MD  furosemide (LASIX) 40 MG tablet Take 40 mg by mouth daily. 02/14/13  Yes Darlyne Russian, MD  HYDROcodone-acetaminophen (NORCO/VICODIN) 5-325 MG per tablet Take 1 tablet by mouth every 6 (six) hours as needed for moderate pain.   Yes Historical Provider, MD  ibandronate (BONIVA) 150 MG tablet Take 150 mg by mouth every 30 (thirty) days. Take  in the morning with a full glass of water, on an empty stomach, and do not take anything else by mouth or lie down for the next 30 min. (take on the 25th of each month)   Yes Historical Provider, MD  Insulin Glargine (LANTUS) 100 UNIT/ML Solostar Pen Inject 16 Units into the skin daily at 10 pm.   Yes Historical Provider, MD  Ipratropium-Albuterol (COMBIVENT RESPIMAT) 20-100 MCG/ACT AERS respimat Inhale 2 puffs into the lungs every 6 (six) hours as needed for wheezing or shortness of breath.    Yes Historical Provider,  MD  lidocaine (LIDODERM) 5 % Place 1 patch onto the skin daily. Remove & Discard patch within 12 hours or as directed by MD 08/31/12  Yes Darlyne Russian, MD  losartan (COZAAR) 100 MG tablet Take 100 mg by mouth daily. 08/31/12  Yes Darlyne Russian, MD  metoprolol succinate (TOPROL-XL) 25 MG 24 hr tablet Take 25 mg by mouth daily.    Yes Historical Provider, MD  Multiple Vitamin (MULTIVITAMIN WITH MINERALS) TABS tablet Take 1 tablet by mouth at bedtime.   Yes Historical Provider, MD  Omega-3 Fatty Acids (FISH OIL) 1200 MG CAPS Take 1,200 mg by mouth 2 (two) times daily. 02/14/13  Yes Darlyne Russian, MD  ranitidine (ZANTAC) 150 MG tablet Take 150 mg by mouth 2 (two) times daily. 08/31/12 08/31/13 Yes Darlyne Russian, MD  rosuvastatin (CRESTOR) 20 MG tablet Take 20 mg by mouth daily. PATIENT NEEDS OFFICE VISIT/LABS FOR ADDITIONAL REFILLS 03/10/13  Yes Darlyne Russian, MD  acetaminophen (TYLENOL) 500 MG tablet Take 1,000 mg by mouth every 4 (four) hours as needed (pain).     Historical Provider, MD   Review of Systems  Skin: Positive for wound.      Objective:   Physical Exam Nursing note and vitals reviewed. Constitutional: Pt is oriented to person, place, and time. Pt appears well-developed and well-nourished. No distress.  HENT: Right and left external ear normal. Head: Normocephalic and atraumatic.  Eyes: EOM are normal. Pupils are equal, round, and reactive to light.  Neck: Neck supple.    Cardiovascular: Normal rate, regular rhythm and normal heart sounds.  Exam reveals no gallop and no friction rub. No murmur heard. Pulmonary/Chest: Effort normal and breath sounds normal. No respiratory distress. She has decreased breath sounds in the bases but good air exchange to .  Abdominal: Soft without tenderness  Musculoskeletal: Normal range of motion.  Neurological: Pt is alert and oriented to person, place, and time.  Skin: Skin is warm and dry. Ulcerations over bottom of foot and heel with dependent rubor. Nail deformities. No palpable pulses  Psychiatric: Pt has a normal mood and affect. Pt's behavior is normal.   BP 148/78  Pulse 73  Temp(Src) 97.7 F (36.5 C) (Oral)  Resp 16  Wt 142 lb 8 oz (64.638 kg)  SpO2 96% Results for orders placed in visit on 04/12/13  POCT GLYCOSYLATED HEMOGLOBIN (HGB A1C)      Result Value Ref Range   Hemoglobin A1C 6.6    GLUCOSE, POCT (MANUAL RESULT ENTRY)      Result Value Ref Range   POC Glucose 120 (*) 70 - 99 mg/dl      Assessment & Plan:   The patient's diabetes under great control. She has end-stage peripheral arterial disease involving the left leg. Upcoming surgery is planned with Dr. Scot Dock. She sees Dr. Florene Glen as her nephrologist. Dr. Gwenlyn Found is her cardiologist. I did increase her hydrocodone to 7.5 mg because she is having excruciating pain. Her daughter will be in charge of the medication .  **Disclaimer: This note was dictated with voice recognition software. Similar sounding words can inadvertently be transcribed and this note may contain transcription errors which may not have been corrected upon publication of note.**    I personally performed the services described in this documentation, which was scribed in my presence. The recorded information has been reviewed and is accurate.

## 2013-04-12 NOTE — Progress Notes (Signed)
Subjective:    Patient ID: Suzanne [name], female    DOB: Oct 24, 1921, 80 y.o.   MRN: [id number] This chart was scribed for Darlyne Russian, MD by Anastasia Pall, ED Scribe. This patient was seen in room Room/bed info not found and the patient's care was started at 3:00 PM.  No chief complaint on file.  HPI Suzanne [name] is a 80 y.o. female    PCP - Sadiel Mota, Lina Sayre, MD  Patient Active Problem List   Diagnosis Date Noted  . Critical lower limb ischemia 04/04/2013  . Claudication 04/03/2013  . UTI (lower urinary tract infection) 01/25/2013  . Urosepsis 01/25/2013  . Long term (current) use of anticoagulants 05/12/2012  . PAD (peripheral artery disease) 03/03/2012  . Liver cyst 02/26/2012  . Cyst, kidney, acquired 02/26/2012  . Kidney stone 02/26/2012  . Chronic kidney disease 11/19/2011  . COPD (chronic obstructive pulmonary disease) 03/18/2011  . Osteoporosis 12/17/2010  . Type II or unspecified type diabetes mellitus without mention of complication, not stated as uncontrolled   . PVD (peripheral vascular disease)   . Mixed hyperlipidemia   . Renal artery stenosis   . Memory loss, short term   . Pulmonary nodule   . CAD (coronary artery disease)   . CHF (congestive heart failure)   . Anemia    Past Medical History  Diagnosis Date  . Type II or unspecified type diabetes mellitus without mention of complication, not stated as uncontrolled   . PVD (peripheral vascular disease)     a. PTA & stent right superficial artery PTA & left common iliac artery stenting. Right fem-pop bypass graft. b. Per Dr. Kennon Holter note: h/o aortobifemoral bypass grafting remotely as well as fem-peroneal bypass grafting.  . Mixed hyperlipidemia   . Renal artery stenosis     a. 1-59% by doppler in 2014.  [name] Compression fx, thoracic spine     T12  . Ischium fracture   . Memory loss, short term   . Pulmonary nodule   . CAD (coronary artery disease) 10/12/2008    a. 30% LAD,30% CX & 40% tandem mid  stenosis in the AV groove, 50-60% RCA  . Chronic diastolic CHF (congestive heart failure) 09-2008    a. EF 40% by cath 2010. b. Improved - last echo 2015 with normal EF.  [name] Anemia   . COPD (chronic obstructive pulmonary disease)   . Detached retina 02-2005    R EYE  . Fracture 02-2007    L5 AND PELVIS  . COPD (chronic obstructive pulmonary disease)   . Left ventricular dysfunction     a. EF 40% by cath 2010. b. Improved - last echo 2015 with normal EF.   [name] AAA (abdominal aortic aneurysm)     Repair unknown date  . Critical lower limb ischemia   . Chronic respiratory failure     a. On home O2.  [name] Hypertension   . Warfarin anticoagulation     DC summary from 2010 reports she is "on chronic  Coumadin for vascular disease." This is listed on her med list going back to 2006 by records available. Teaching service H&P remotely says she has h/o DVT but I cannot find any evidence of this. Admission H/P 03/2013 suggests history of atrial fib but all ECGs appear sinus and there is no mention of this previously.   Past Surgical History  Procedure Laterality Date  . Aorta surgery      aortic anyuersm surgery.   [name]  Vessel surgery      S/P aortobifemoral bypass grafting and right fem-peroneal bypass grafting remotely  . Cholecystectomy    . Cataract surgery  06-2006    R EYE  . Vascular surgery    . Cardiac catheterization     Allergies  Allergen Reactions  . Codeine Other (See Comments)    hallucinations  . Hydrochlorothiazide Other (See Comments)    hypotension  . Other     Adhesives and bandages gives pt a rash.  . Pregabalin Other (See Comments)    unknown   Prior to Admission medications   Medication Sig Start Date End Date Taking? Authorizing Provider  acetaminophen (TYLENOL) 500 MG tablet Take 1,000 mg by mouth every 4 (four) hours as needed (pain).     Historical Provider, MD  albuterol (PROVENTIL) (2.5 MG/3ML) 0.083% nebulizer solution Take 2.5 mg by nebulization See admin  instructions. Use twice daily and a third time as needed for shortness of breath or wheezing    Historical Provider, MD  aspirin EC 325 MG EC tablet Take 1 tablet (325 mg total) by mouth daily. 04/05/13   Dayna N Dunn, PA-C  budesonide-formoterol (SYMBICORT) 160-4.5 MCG/ACT inhaler Inhale 2 puffs into the lungs 2 (two) times daily. 08/31/12   Darlyne Russian, MD  Calcium Carb-Cholecalciferol (CALCIUM 600 + D PO) Take 600 mg by mouth daily.     Historical Provider, MD  esomeprazole (NEXIUM) 40 MG capsule Take 40 mg by mouth daily before breakfast. 08/31/12   Darlyne Russian, MD  ferrous sulfate 325 (65 FE) MG tablet Take 325 mg by mouth at bedtime.    Historical Provider, MD  furosemide (LASIX) 40 MG tablet Take 40 mg by mouth daily. 02/14/13   Darlyne Russian, MD  HYDROcodone-acetaminophen (NORCO/VICODIN) 5-325 MG per tablet Take 1 tablet by mouth every 6 (six) hours as needed for moderate pain.    Historical Provider, MD  ibandronate (BONIVA) 150 MG tablet Take 150 mg by mouth every 30 (thirty) days. Take in the morning with a full glass of water, on an empty stomach, and do not take anything else by mouth or lie down for the next 30 min. (take on the 25th of each month)    Historical Provider, MD  Insulin Glargine (LANTUS) 100 UNIT/ML Solostar Pen Inject 16 Units into the skin daily at 10 pm.    Historical Provider, MD  Ipratropium-Albuterol (COMBIVENT RESPIMAT) 20-100 MCG/ACT AERS respimat Inhale 2 puffs into the lungs every 6 (six) hours as needed for wheezing or shortness of breath.     Historical Provider, MD  lidocaine (LIDODERM) 5 % Place 1 patch onto the skin daily. Remove & Discard patch within 12 hours or as directed by MD 08/31/12   Darlyne Russian, MD  losartan (COZAAR) 100 MG tablet Take 100 mg by mouth daily. 08/31/12   Darlyne Russian, MD  metoprolol succinate (TOPROL-XL) 25 MG 24 hr tablet Take 25 mg by mouth daily.     Historical Provider, MD  Multiple Vitamin (MULTIVITAMIN WITH MINERALS) TABS  tablet Take 1 tablet by mouth at bedtime.    Historical Provider, MD  Omega-3 Fatty Acids (FISH OIL) 1200 MG CAPS Take 1,200 mg by mouth 2 (two) times daily. 02/14/13   Darlyne Russian, MD  ranitidine (ZANTAC) 150 MG tablet Take 150 mg by mouth 2 (two) times daily. 08/31/12 08/31/13  Darlyne Russian, MD  rosuvastatin (CRESTOR) 20 MG tablet Take 20 mg by mouth daily. PATIENT NEEDS  OFFICE VISIT/LABS FOR ADDITIONAL REFILLS 03/10/13   Darlyne Russian, MD   Review of Systems     Objective:   Physical Exam Nursing note and vitals reviewed. Constitutional: Pt is oriented to person, place, and time. Pt appears well-developed and well-nourished. No distress.  HENT: Right TM nl. Left TM nl. Oropharynx clear and moist, no exudate. Nose nl.  Head: Normocephalic and atraumatic.  Eyes: EOM are normal. Pupils are equal, round, and reactive to light.  Neck: Neck supple. No thyromegaly. No cervical adenopathy.  Cardiovascular: Normal rate, regular rhythm and normal heart sounds.  Exam reveals no gallop and no friction rub. No murmur heard. Pulmonary/Chest: Effort normal and breath sounds normal. No respiratory distress. Pt has no wheezes. Pt has no rales.  Abdominal: Soft. Bowel sounds are normal. There is no tenderness. There is no rebound and no guarding. No hepatosplenomegaly. No CVA tenderness.  Musculoskeletal: Normal range of motion. No tenderness. No edema.   Neurological: Pt is alert and oriented to person, place, and time.  Skin: Skin is warm and dry.  Psychiatric: Pt has a normal mood and affect. Pt's behavior is normal.   There were no vitals taken for this visit.     Assessment & Plan:   Overall patient is doing extremely well. She is scheduled for surgery to revascularize the left lower leg. Her diabetes is under good control. The biggest concern again will be her kidney function. She has a followup visit with Dr. Florene Glen    I personally performed the services described in this documentation, which  was scribed in my presence. The recorded information has been reviewed and is accurate.  **Disclaimer: This note was dictated with voice recognition software. Similar sounding words can inadvertently be transcribed and this note may contain transcription errors which may not have been corrected upon publication of note.**

## 2013-04-13 ENCOUNTER — Ambulatory Visit: Payer: Medicare Other | Admitting: Vascular Surgery

## 2013-04-13 ENCOUNTER — Ambulatory Visit (INDEPENDENT_AMBULATORY_CARE_PROVIDER_SITE_OTHER): Payer: Medicare Other | Admitting: Vascular Surgery

## 2013-04-13 ENCOUNTER — Ambulatory Visit (HOSPITAL_COMMUNITY)
Admit: 2013-04-13 | Discharge: 2013-04-13 | Disposition: A | Discharge status: Home / Self Care | Payer: Medicare Other | Attending: Vascular Surgery | Admitting: Vascular Surgery

## 2013-04-13 ENCOUNTER — Encounter: Payer: Self-pay | Admitting: Vascular Surgery

## 2013-04-13 ENCOUNTER — Encounter: Payer: Self-pay | Admitting: *Deleted

## 2013-04-13 ENCOUNTER — Other Ambulatory Visit: Payer: Self-pay | Admitting: *Deleted

## 2013-04-13 VITALS — BP 156/54 | HR 86 | Ht 62.0 in | Wt 142.0 lb

## 2013-04-13 DIAGNOSIS — L98499 Non-pressure chronic ulcer of skin of other sites with unspecified severity: Secondary | ICD-10-CM

## 2013-04-13 DIAGNOSIS — I739 Peripheral vascular disease, unspecified: Secondary | ICD-10-CM

## 2013-04-13 DIAGNOSIS — I70229 Atherosclerosis of native arteries of extremities with rest pain, unspecified extremity: Secondary | ICD-10-CM

## 2013-04-13 DIAGNOSIS — I999 Unspecified disorder of circulatory system: Secondary | ICD-10-CM

## 2013-04-13 DIAGNOSIS — I998 Other disorder of circulatory system: Secondary | ICD-10-CM

## 2013-04-13 DIAGNOSIS — I70209 Unspecified atherosclerosis of native arteries of extremities, unspecified extremity: Secondary | ICD-10-CM | POA: Insufficient documentation

## 2013-04-13 DIAGNOSIS — Z01818 Encounter for other preprocedural examination: Secondary | ICD-10-CM | POA: Insufficient documentation

## 2013-04-13 NOTE — Assessment & Plan Note (Signed)
Patient has nonhealing wounds of the left foot with multilevel arterial occlusive disease. She has a tight stenosis of the distal left common femoral artery and the distal aspect of the left limb of her aortobifemoral bypass graft. In addition she has significant disease of the distal superficial femoral artery on the left. Given the nonhealing wounds on her left foot this is clearly a limb threatening situation. I think without revascularization the wounds would not heal and she would require a primary amputation. By her age and multiple medical conditions she is very with it and mentally very sharp. She lives at home. I have recommended that we addressed the common femoral artery stenosis with endarterectomy and vein patch and also potentially proceed with left femoral to below knee popliteal artery bypass with a vein graft as her best chance of limb salvage. Certainly this will be associated with some slightly increased risk given her age, cardiac history, and pulmonary status. She understands these risk however and wishes to proceed. I have reviewed the indications for lower extremity bypass. I have also reviewed the potential complications of surgery including but not limited to: wound healing problems, infection, graft thrombosis, limb loss, or other unpredictable medical problems. All the patient's questions were answered and they are agreeable to proceed. Her surgery is scheduled for 04/19/2013.

## 2013-04-13 NOTE — Progress Notes (Signed)
[facility]  Patient name: Suzanne [name] MRN: [id number] DOB: 01/03/1922 Sex: female  REASON FOR CONSULT: Peripheral vascular disease with nonhealing wounds of the left foot. Referred by Dr. Quay Burow.  HPI: Suzanne [name] is a 80 y.o. female who developed a crack on her left heel back in May of 2014. She has subsequently developed several other small wounds on the left foot. She underwent an arteriogram by Dr. Gwenlyn Found which showed a tight stenosis in the common femoral artery at the distal aspect of the left limb of her aortobifemoral bypass graft. She was sent for vascular consultation.  She underwent repair of an infrarenal abdominal aortic aneurysm with an aortobifemoral bypass graft by Dr. Amedeo Plenty in 1998. She's had multiple revascularization attempts in the right leg. She currently has some mild claudication bilaterally but however her activity is fairly limited by her pulmonary status.  Her risk factors for peripheral vascular disease include diabetes, hypertension, hypercholesterolemia, and a remote history of tobacco use.  Past Medical History  Diagnosis Date  . Type II or unspecified type diabetes mellitus without mention of complication, not stated as uncontrolled   . PVD (peripheral vascular disease)     a. PTA & stent right superficial artery PTA & left common iliac artery stenting. Right fem-pop bypass graft. b. Per Dr. Kennon Holter note: h/o aortobifemoral bypass grafting remotely as well as fem-peroneal bypass grafting.  . Mixed hyperlipidemia   . Renal artery stenosis     a. 1-59% by doppler in 2014.  [name] Compression fx, thoracic spine     T12  . Ischium fracture   . Memory loss, short term   . Pulmonary nodule   . CAD (coronary artery disease) 10/12/2008    a. 30% LAD,30% CX & 40% tandem mid stenosis in the AV groove, 50-60% RCA  . Chronic diastolic CHF (congestive heart failure) 09-2008    a. EF 40% by cath 2010. b. Improved - last echo 2015  with normal EF.  [name] Anemia   . COPD (chronic obstructive pulmonary disease)   . Detached retina 02-2005    R EYE  . Fracture 02-2007    L5 AND PELVIS  . COPD (chronic obstructive pulmonary disease)   . Left ventricular dysfunction     a. EF 40% by cath 2010. b. Improved - last echo 2015 with normal EF.   [name] AAA (abdominal aortic aneurysm)     Repair unknown date  . Critical lower limb ischemia   . Chronic respiratory failure     a. On home O2.  [name] Hypertension   . Warfarin anticoagulation     DC summary from 2010 reports she is "on chronic  Coumadin for vascular disease." This is listed on her med list going back to 2006 by records available. Teaching service H&P remotely says she has h/o DVT but I cannot find any evidence of this. Admission H/P 03/2013 suggests history of atrial fib but all ECGs appear sinus and there is no mention of this previously.   Family History  Problem Relation Age of Onset  . Tuberculosis Mother   . Cancer Father    SOCIAL HISTORY: History  Substance Use Topics  . Smoking status: Former Smoker    Types: Cigarettes    Quit date: 02/16/1997  . Smokeless tobacco: Never Used  . Alcohol Use: No   Allergies  Allergen Reactions  . Codeine Other (See Comments)    hallucinations  . Hydrochlorothiazide Other (See Comments)  hypotension  . Other     Adhesives and bandages gives pt a rash.  . Pregabalin Other (See Comments)    unknown   Current Outpatient Prescriptions  Medication Sig Dispense Refill  . acetaminophen (TYLENOL) 500 MG tablet Take 1,000 mg by mouth every 4 (four) hours as needed (pain).       [name] albuterol (PROVENTIL) (2.5 MG/3ML) 0.083% nebulizer solution Take 2.5 mg by nebulization See admin instructions. Use twice daily and a third time as needed for shortness of breath or wheezing      . aspirin EC 325 MG EC tablet Take 1 tablet (325 mg total) by mouth daily.      . budesonide-formoterol (SYMBICORT) 160-4.5 MCG/ACT inhaler Inhale 2 puffs  into the lungs 2 (two) times daily.      . Calcium Carb-Cholecalciferol (CALCIUM 600 + D PO) Take 600 mg by mouth daily.       [name] esomeprazole (NEXIUM) 40 MG capsule Take 40 mg by mouth daily before breakfast.      . ferrous sulfate 325 (65 FE) MG tablet Take 325 mg by mouth at bedtime.      . furosemide (LASIX) 40 MG tablet Take 40 mg by mouth daily.      [name] HYDROcodone-acetaminophen (NORCO) 7.5-325 MG per tablet Take 1 tablet by mouth every 4 (four) hours as needed.  40 tablet  0  . ibandronate (BONIVA) 150 MG tablet Take 150 mg by mouth every 30 (thirty) days. Take in the morning with a full glass of water, on an empty stomach, and do not take anything else by mouth or lie down for the next 30 min. (take on the 25th of each month)      . Insulin Glargine (LANTUS) 100 UNIT/ML Solostar Pen Inject 16 Units into the skin daily at 10 pm.      . Ipratropium-Albuterol (COMBIVENT RESPIMAT) 20-100 MCG/ACT AERS respimat Inhale 2 puffs into the lungs every 6 (six) hours as needed for wheezing or shortness of breath.       . lidocaine (LIDODERM) 5 % Place 1 patch onto the skin daily. Remove & Discard patch within 12 hours or as directed by MD      . losartan (COZAAR) 100 MG tablet Take 100 mg by mouth daily.      . metoprolol succinate (TOPROL-XL) 25 MG 24 hr tablet Take 25 mg by mouth daily.       . Multiple Vitamin (MULTIVITAMIN WITH MINERALS) TABS tablet Take 1 tablet by mouth at bedtime.      . Omega-3 Fatty Acids (FISH OIL) 1200 MG CAPS Take 1,200 mg by mouth 2 (two) times daily.      . ranitidine (ZANTAC) 150 MG tablet Take 150 mg by mouth 2 (two) times daily.      . rosuvastatin (CRESTOR) 20 MG tablet Take 20 mg by mouth daily. PATIENT NEEDS OFFICE VISIT/LABS FOR ADDITIONAL REFILLS       No current facility-administered medications for this visit.   REVIEW OF SYSTEMS: Valu.Nieves ] denotes positive finding; [  ] denotes negative finding  CARDIOVASCULAR:  [ ]  chest pain   [ ]  chest pressure   [ ]  palpitations    [ ]  orthopnea   Valu.Nieves ] dyspnea on exertion   Valu.Nieves ] claudication   Valu.Nieves ] rest pain   Valu.Nieves ] DVT   [ ]  phlebitis PULMONARY:   [ ]  productive cough   Valu.Nieves ] asthma   Valu.Nieves ] wheezing She is  on home oxygen at 2.5 L. NEUROLOGIC:   [ ]  weakness  [ ]  paresthesias  [ ]  aphasia  [ ]  amaurosis  [ ]  dizziness HEMATOLOGIC:   [ ]  bleeding problems   [ ]  clotting disorders MUSCULOSKELETAL:  [ ]  joint pain   [ ]  joint swelling Valu.Nieves ] leg swelling GASTROINTESTINAL: [ ]   blood in stool  [ ]   hematemesis GENITOURINARY:  [ ]   dysuria  [ ]   hematuria PSYCHIATRIC:  [ ]  history of major depression INTEGUMENTARY:  [ ]  rashes  [ ]  ulcers CONSTITUTIONAL:  [ ]  fever   [ ]  chills  PHYSICAL EXAM: Filed Vitals:   04/13/13 1558  BP: 156/54  Pulse: 86  Height: 5\' 2"  (1.575 m)  Weight: 142 lb (64.411 kg)  SpO2: 95%   Body mass index is 25.97 kg/(m^2). GENERAL: The patient is a well-nourished female, in no acute distress. The vital signs are documented above. CARDIOVASCULAR: There is a regular rate and rhythm. She has a left carotid bruit. She has a palpable right femoral pulse. She has a diminished left femoral pulse. I cannot palpate popliteal or pedal pulses on either side. She has no significant lower extremity swelling. PULMONARY: There is good air exchange bilaterally without wheezing or rales. ABDOMEN: Soft and non-tender with normal pitched bowel sounds.  MUSCULOSKELETAL: There are no major deformities or cyanosis. NEUROLOGIC: No focal weakness or paresthesias are detected. SKIN: there is an ulcer on her left heel that measures approximately a centimeter in diameter. There were several small cracks in her left foot and some mild erythema. PSYCHIATRIC: The patient has a normal affect.  DATA:  Have reviewed her arteriogram that was performed by Dr. Gwenlyn Found. She has a tight stenosis in her distal common femoral artery on the left at the distal left limb of her aortobifemoral bypass graft. She has moderate to severe disease of  the distal superficial femoral artery. The below-knee popliteal artery is patent with a dominant runoff being the anterior tibial artery.  I have independently interpreted her vein mapping which shows that she has a reasonable greater saphenous vein in the left lower extremity.  MEDICAL ISSUES:  Atherosclerotic PVD with ulceration Patient has nonhealing wounds of the left foot with multilevel arterial occlusive disease. She has a tight stenosis of the distal left common femoral artery and the distal aspect of the left limb of her aortobifemoral bypass graft. In addition she has significant disease of the distal superficial femoral artery on the left. Given the nonhealing wounds on her left foot this is clearly a limb threatening situation. I think without revascularization the wounds would not heal and she would require a primary amputation. By her age and multiple medical conditions she is very with it and mentally very sharp. She lives at home. I have recommended that we addressed the common femoral artery stenosis with endarterectomy and vein patch and also potentially proceed with left femoral to below knee popliteal artery bypass with a vein graft as her best chance of limb salvage. Certainly this will be associated with some slightly increased risk given her age, cardiac history, and pulmonary status. She understands these risk however and wishes to proceed. I have reviewed the indications for lower extremity bypass. I have also reviewed the potential complications of surgery including but not limited to: wound healing problems, infection, graft thrombosis, limb loss, or other unpredictable medical problems. All the patient's questions were answered and they are agreeable to proceed. Her surgery is scheduled for  04/19/2013.      [facility] [place] Beeper: [phone]

## 2013-04-15 ENCOUNTER — Encounter (HOSPITAL_COMMUNITY): Payer: Self-pay | Admitting: Pharmacy Technician

## 2013-04-17 ENCOUNTER — Encounter: Payer: Self-pay | Admitting: *Deleted

## 2013-04-18 ENCOUNTER — Encounter (HOSPITAL_COMMUNITY)
Admission: RE | Admit: 2013-04-18 | Discharge: 2013-04-18 | Disposition: A | Discharge status: Home / Self Care | Payer: Medicare Other | Source: Ambulatory Visit | Attending: Vascular Surgery | Admitting: Vascular Surgery

## 2013-04-18 ENCOUNTER — Other Ambulatory Visit (HOSPITAL_COMMUNITY): Payer: Self-pay | Admitting: *Deleted

## 2013-04-18 ENCOUNTER — Encounter (HOSPITAL_COMMUNITY): Payer: Self-pay

## 2013-04-18 LAB — CBC
HCT: 34.1 % — ABNORMAL LOW (ref 36.0–46.0)
Hemoglobin: 10.9 g/dL — ABNORMAL LOW (ref 12.0–15.0)
MCH: 30.9 pg (ref 26.0–34.0)
MCHC: 32 g/dL (ref 30.0–36.0)
MCV: 96.6 fL (ref 78.0–100.0)
PLATELETS: 215 10*3/uL (ref 150–400)
RBC: 3.53 MIL/uL — ABNORMAL LOW (ref 3.87–5.11)
RDW: 12.5 % (ref 11.5–15.5)
WBC: 7.8 10*3/uL (ref 4.0–10.5)

## 2013-04-18 LAB — COMPREHENSIVE METABOLIC PANEL
ALBUMIN: 3.5 g/dL (ref 3.5–5.2)
ALK PHOS: 55 U/L (ref 39–117)
ALT: 15 U/L (ref 0–35)
AST: 23 U/L (ref 0–37)
BILIRUBIN TOTAL: 0.3 mg/dL (ref 0.3–1.2)
BUN: 44 mg/dL — ABNORMAL HIGH (ref 6–23)
CHLORIDE: 97 meq/L (ref 96–112)
CO2: 27 mEq/L (ref 19–32)
Calcium: 10.3 mg/dL (ref 8.4–10.5)
Creatinine, Ser: 2.01 mg/dL — ABNORMAL HIGH (ref 0.50–1.10)
GFR calc Af Amer: 24 mL/min — ABNORMAL LOW (ref >90)
GFR calc non Af Amer: 21 mL/min — ABNORMAL LOW (ref >90)
Glucose, Bld: 150 mg/dL — ABNORMAL HIGH (ref 70–99)
POTASSIUM: 4.2 meq/L (ref 3.7–5.3)
SODIUM: 139 meq/L (ref 137–147)
Total Protein: 7.4 g/dL (ref 6.0–8.3)

## 2013-04-18 LAB — URINALYSIS, ROUTINE W REFLEX MICROSCOPIC
Bilirubin Urine: NEGATIVE
GLUCOSE, UA: NEGATIVE mg/dL
HGB URINE DIPSTICK: NEGATIVE
KETONES UR: NEGATIVE mg/dL
Leukocytes, UA: NEGATIVE
Nitrite: NEGATIVE
Protein, ur: NEGATIVE mg/dL
Specific Gravity, Urine: 1.013 (ref 1.005–1.030)
Urobilinogen, UA: 0.2 mg/dL (ref 0.0–1.0)
pH: 6 (ref 5.0–8.0)

## 2013-04-18 LAB — SURGICAL PCR SCREEN
MRSA, PCR: NEGATIVE
Staphylococcus aureus: NEGATIVE

## 2013-04-18 LAB — PROTIME-INR
INR: 1.05 (ref 0.00–1.49)
Prothrombin Time: 13.5 seconds (ref 11.6–15.2)

## 2013-04-18 LAB — APTT: APTT: 32 s (ref 24–37)

## 2013-04-18 LAB — ABO/RH: ABO/RH(D): B POS

## 2013-04-18 MED ORDER — DEXTROSE 5 % IV SOLN
1.5000 g | INTRAVENOUS | Status: AC
  Administered 2013-04-19: 1.5 g via INTRAVENOUS
  Filled 2013-04-18: qty 1.5

## 2013-04-18 NOTE — Progress Notes (Signed)
Wears oxygen  2 liters--mainly wears 20 hrs a day.

## 2013-04-18 NOTE — Pre-Procedure Instructions (Addendum)
Suzanne [name]  04/18/2013   Your procedure is scheduled on:  Tuesday, April 19, 2013 at 7:45 AM.   Report to [facility] Entrance "A" Admitting Office at 5:30 AM.   Call this number if you have problems the morning of surgery: [phone]   Remember:   Do not eat food or drink liquids after midnight tonight.   Take these medicines the morning of surgery with A SIP OF WATER: aspirin EC, esomeprazole (NEXIUM), ranitidine (ZANTAC), metoprolol succinate (TOPROL-XL), budesonide-formoterol (SYMBICORT).    Do not wear jewelry, make-up or nail polish.  Do not wear lotions, powders, or perfumes. You may NOT wear deodorant.  Do not shave underarms & legs 48 hours prior to surgery.   Do not bring valuables to the hospital.  [facility] is not responsible for any belongings or valuables.               Contacts, dentures or bridgework may not be worn into surgery.  Leave suitcase in the car. After surgery it may be brought to your room.  For patients admitted to the hospital, discharge time is determined by your treatment team.                                                                                                                                                                                              Special Instructions: [place] - Preparing for Surgery

## 2013-04-19 ENCOUNTER — Encounter (HOSPITAL_COMMUNITY): Payer: Medicare Other | Admitting: Anesthesiology

## 2013-04-19 ENCOUNTER — Inpatient Hospital Stay (HOSPITAL_COMMUNITY): Payer: Medicare Other

## 2013-04-19 ENCOUNTER — Encounter (HOSPITAL_COMMUNITY)
Admission: RE | Disposition: A | Discharge status: Skilled Nursing Facility | Payer: Self-pay | Source: Ambulatory Visit | Attending: Vascular Surgery

## 2013-04-19 ENCOUNTER — Inpatient Hospital Stay (HOSPITAL_COMMUNITY): Payer: Medicare Other | Admitting: Anesthesiology

## 2013-04-19 ENCOUNTER — Inpatient Hospital Stay (HOSPITAL_COMMUNITY)
Admission: RE | Admit: 2013-04-19 | Discharge: 2013-04-23 | DRG: 253 | Disposition: A | Discharge status: Skilled Nursing Facility | Payer: Medicare Other | Source: Ambulatory Visit | Attending: Vascular Surgery | Admitting: Vascular Surgery

## 2013-04-19 ENCOUNTER — Encounter (HOSPITAL_COMMUNITY): Payer: Self-pay | Admitting: *Deleted

## 2013-04-19 DIAGNOSIS — I509 Heart failure, unspecified: Secondary | ICD-10-CM | POA: Diagnosis present

## 2013-04-19 DIAGNOSIS — I129 Hypertensive chronic kidney disease with stage 1 through stage 4 chronic kidney disease, or unspecified chronic kidney disease: Secondary | ICD-10-CM | POA: Diagnosis present

## 2013-04-19 DIAGNOSIS — I739 Peripheral vascular disease, unspecified: Principal | ICD-10-CM | POA: Diagnosis present

## 2013-04-19 DIAGNOSIS — L98499 Non-pressure chronic ulcer of skin of other sites with unspecified severity: Principal | ICD-10-CM | POA: Diagnosis present

## 2013-04-19 DIAGNOSIS — N184 Chronic kidney disease, stage 4 (severe): Secondary | ICD-10-CM | POA: Diagnosis present

## 2013-04-19 DIAGNOSIS — L97409 Non-pressure chronic ulcer of unspecified heel and midfoot with unspecified severity: Secondary | ICD-10-CM | POA: Diagnosis present

## 2013-04-19 DIAGNOSIS — Z794 Long term (current) use of insulin: Secondary | ICD-10-CM

## 2013-04-19 DIAGNOSIS — I70209 Unspecified atherosclerosis of native arteries of extremities, unspecified extremity: Secondary | ICD-10-CM

## 2013-04-19 DIAGNOSIS — Z87891 Personal history of nicotine dependence: Secondary | ICD-10-CM

## 2013-04-19 DIAGNOSIS — Z79899 Other long term (current) drug therapy: Secondary | ICD-10-CM

## 2013-04-19 DIAGNOSIS — J4489 Other specified chronic obstructive pulmonary disease: Secondary | ICD-10-CM | POA: Diagnosis present

## 2013-04-19 DIAGNOSIS — I2510 Atherosclerotic heart disease of native coronary artery without angina pectoris: Secondary | ICD-10-CM | POA: Diagnosis present

## 2013-04-19 DIAGNOSIS — Z7982 Long term (current) use of aspirin: Secondary | ICD-10-CM

## 2013-04-19 DIAGNOSIS — J449 Chronic obstructive pulmonary disease, unspecified: Secondary | ICD-10-CM | POA: Diagnosis present

## 2013-04-19 DIAGNOSIS — E782 Mixed hyperlipidemia: Secondary | ICD-10-CM | POA: Diagnosis present

## 2013-04-19 DIAGNOSIS — M810 Age-related osteoporosis without current pathological fracture: Secondary | ICD-10-CM | POA: Diagnosis present

## 2013-04-19 DIAGNOSIS — Z01818 Encounter for other preprocedural examination: Secondary | ICD-10-CM

## 2013-04-19 DIAGNOSIS — Z01812 Encounter for preprocedural laboratory examination: Secondary | ICD-10-CM

## 2013-04-19 DIAGNOSIS — A0472 Enterocolitis due to Clostridium difficile, not specified as recurrent: Secondary | ICD-10-CM | POA: Diagnosis present

## 2013-04-19 DIAGNOSIS — E119 Type 2 diabetes mellitus without complications: Secondary | ICD-10-CM | POA: Diagnosis present

## 2013-04-19 DIAGNOSIS — D649 Anemia, unspecified: Secondary | ICD-10-CM | POA: Diagnosis present

## 2013-04-19 HISTORY — PX: FEMORAL-POPLITEAL BYPASS GRAFT: SHX937

## 2013-04-19 HISTORY — PX: PATCH ANGIOPLASTY: SHX6230

## 2013-04-19 LAB — CBC
HEMATOCRIT: 32.7 % — AB (ref 36.0–46.0)
HEMOGLOBIN: 10.8 g/dL — AB (ref 12.0–15.0)
MCH: 29 pg (ref 26.0–34.0)
MCHC: 33 g/dL (ref 30.0–36.0)
MCV: 87.7 fL (ref 78.0–100.0)
Platelets: 151 10*3/uL (ref 150–400)
RBC: 3.73 MIL/uL — ABNORMAL LOW (ref 3.87–5.11)
RDW: 18.2 % — ABNORMAL HIGH (ref 11.5–15.5)
WBC: 15.3 10*3/uL — ABNORMAL HIGH (ref 4.0–10.5)

## 2013-04-19 LAB — PREPARE RBC (CROSSMATCH)

## 2013-04-19 LAB — GLUCOSE, CAPILLARY
GLUCOSE-CAPILLARY: 146 mg/dL — AB (ref 70–99)
Glucose-Capillary: 154 mg/dL — ABNORMAL HIGH (ref 70–99)
Glucose-Capillary: 135 mg/dL — ABNORMAL HIGH (ref 70–99)

## 2013-04-19 LAB — POCT I-STAT 4, (NA,K, GLUC, HGB,HCT)
GLUCOSE: 163 mg/dL — AB (ref 70–99)
HCT: 23 % — ABNORMAL LOW (ref 36.0–46.0)
HEMOGLOBIN: 7.8 g/dL — AB (ref 12.0–15.0)
Potassium: 4 mEq/L (ref 3.7–5.3)
Sodium: 141 mEq/L (ref 137–147)

## 2013-04-19 LAB — CREATININE, SERUM
CREATININE: 1.85 mg/dL — AB (ref 0.50–1.10)
GFR calc Af Amer: 27 mL/min — ABNORMAL LOW (ref >90)
GFR calc non Af Amer: 23 mL/min — ABNORMAL LOW (ref >90)

## 2013-04-19 SURGERY — ANGIOPLASTY, USING PATCH GRAFT
Anesthesia: General | Site: Leg Upper | Laterality: Left

## 2013-04-19 MED ORDER — CHLORHEXIDINE GLUCONATE 4 % EX LIQD
60.0000 mL | Freq: Once | CUTANEOUS | Status: DC
  Filled 2013-04-19: qty 60

## 2013-04-19 MED ORDER — SODIUM CHLORIDE 0.9 % IV SOLN
500.0000 mL | Freq: Once | INTRAVENOUS | Status: DC | PRN
Start: 2013-04-19 — End: 2013-04-19

## 2013-04-19 MED ORDER — OMEGA-3-ACID ETHYL ESTERS 1 G PO CAPS
1.0000 g | ORAL_CAPSULE | Freq: Two times a day (BID) | ORAL | Status: DC
  Administered 2013-04-19 – 2013-04-23 (×8): 1 g via ORAL
  Filled 2013-04-19 (×10): qty 1

## 2013-04-19 MED ORDER — ACETAMINOPHEN 325 MG PO TABS: 325.0000 mg | ORAL_TABLET | ORAL | Status: DC | PRN

## 2013-04-19 MED ORDER — PHENYLEPHRINE 40 MCG/ML (10ML) SYRINGE FOR IV PUSH (FOR BLOOD PRESSURE SUPPORT)
PREFILLED_SYRINGE | INTRAVENOUS | Status: AC
  Filled 2013-04-19: qty 10

## 2013-04-19 MED ORDER — ADULT MULTIVITAMIN W/MINERALS CH
1.0000 | ORAL_TABLET | Freq: Every day | ORAL | Status: DC
  Administered 2013-04-19 – 2013-04-22 (×4): 1 via ORAL
  Filled 2013-04-19 (×6): qty 1

## 2013-04-19 MED ORDER — FENTANYL CITRATE 0.05 MG/ML IJ SOLN
INTRAMUSCULAR | Status: DC | PRN
  Administered 2013-04-19: 100 ug via INTRAVENOUS
  Administered 2013-04-19 (×4): 50 ug via INTRAVENOUS

## 2013-04-19 MED ORDER — FUROSEMIDE 40 MG PO TABS
40.0000 mg | ORAL_TABLET | Freq: Every day | ORAL | Status: DC
  Administered 2013-04-20 – 2013-04-23 (×4): 40 mg via ORAL
  Filled 2013-04-19 (×5): qty 1

## 2013-04-19 MED ORDER — MORPHINE SULFATE 2 MG/ML IJ SOLN: 2.0000 mg | INTRAMUSCULAR | Status: DC | PRN

## 2013-04-19 MED ORDER — FENTANYL CITRATE 0.05 MG/ML IJ SOLN
INTRAMUSCULAR | Status: AC
  Filled 2013-04-19: qty 5

## 2013-04-19 MED ORDER — NEOSTIGMINE METHYLSULFATE 1 MG/ML IJ SOLN
INTRAMUSCULAR | Status: AC
  Filled 2013-04-19: qty 10

## 2013-04-19 MED ORDER — HYDROCODONE-ACETAMINOPHEN 7.5-325 MG PO TABS
1.0000 | ORAL_TABLET | ORAL | Status: DC | PRN
  Administered 2013-04-20 – 2013-04-22 (×3): 1 via ORAL
  Filled 2013-04-19 (×3): qty 1

## 2013-04-19 MED ORDER — PHENYLEPHRINE HCL 10 MG/ML IJ SOLN
INTRAMUSCULAR | Status: AC
  Filled 2013-04-19: qty 1

## 2013-04-19 MED ORDER — LIDOCAINE HCL (CARDIAC) 20 MG/ML IV SOLN
INTRAVENOUS | Status: AC
  Filled 2013-04-19: qty 5

## 2013-04-19 MED ORDER — HYDRALAZINE HCL 20 MG/ML IJ SOLN: 10.0000 mg | INTRAMUSCULAR | Status: DC | PRN

## 2013-04-19 MED ORDER — PROTAMINE SULFATE 10 MG/ML IV SOLN
INTRAVENOUS | Status: DC | PRN
  Administered 2013-04-19 (×4): 10 mg via INTRAVENOUS

## 2013-04-19 MED ORDER — THROMBIN 20000 UNITS EX SOLR
CUTANEOUS | Status: AC
  Filled 2013-04-19: qty 20000

## 2013-04-19 MED ORDER — DEXTROSE 5 % IV SOLN: 1.5000 g | Freq: Two times a day (BID) | INTRAVENOUS | Status: DC

## 2013-04-19 MED ORDER — ROCURONIUM BROMIDE 100 MG/10ML IV SOLN
INTRAVENOUS | Status: DC | PRN
  Administered 2013-04-19: 50 mg via INTRAVENOUS

## 2013-04-19 MED ORDER — POTASSIUM CHLORIDE CRYS ER 20 MEQ PO TBCR: 20.0000 meq | EXTENDED_RELEASE_TABLET | Freq: Every day | ORAL | Status: DC | PRN

## 2013-04-19 MED ORDER — METOPROLOL TARTRATE 1 MG/ML IV SOLN: 2.0000 mg | INTRAVENOUS | Status: DC | PRN

## 2013-04-19 MED ORDER — ACETAMINOPHEN 500 MG PO TABS: 1000.0000 mg | ORAL_TABLET | ORAL | Status: DC | PRN

## 2013-04-19 MED ORDER — PANTOPRAZOLE SODIUM 40 MG PO TBEC: 80.0000 mg | DELAYED_RELEASE_TABLET | Freq: Every day | ORAL | Status: DC

## 2013-04-19 MED ORDER — SODIUM CHLORIDE 0.9 % IV SOLN
INTRAVENOUS | Status: DC
  Administered 2013-04-19 – 2013-04-21 (×2): via INTRAVENOUS
  Administered 2013-04-21: 100 mL/h via INTRAVENOUS
  Administered 2013-04-22 – 2013-04-23 (×2): via INTRAVENOUS

## 2013-04-19 MED ORDER — PANTOPRAZOLE SODIUM 40 MG PO TBEC: 40.0000 mg | DELAYED_RELEASE_TABLET | Freq: Every day | ORAL | Status: DC

## 2013-04-19 MED ORDER — HEPARIN SODIUM (PORCINE) 1000 UNIT/ML IJ SOLN
INTRAMUSCULAR | Status: DC | PRN
  Administered 2013-04-19: 5000 [IU] via INTRAVENOUS
  Administered 2013-04-19: 3000 [IU] via INTRAVENOUS

## 2013-04-19 MED ORDER — PROPOFOL 10 MG/ML IV BOLUS
INTRAVENOUS | Status: AC
  Filled 2013-04-19: qty 20

## 2013-04-19 MED ORDER — ENOXAPARIN SODIUM 40 MG/0.4ML ~~LOC~~ SOLN
40.0000 mg | SUBCUTANEOUS | Status: DC
  Filled 2013-04-19: qty 0.4

## 2013-04-19 MED ORDER — ATORVASTATIN CALCIUM 40 MG PO TABS
40.0000 mg | ORAL_TABLET | Freq: Every day | ORAL | Status: DC
  Administered 2013-04-21 – 2013-04-22 (×2): 40 mg via ORAL
  Filled 2013-04-19 (×6): qty 1

## 2013-04-19 MED ORDER — MAGNESIUM SULFATE 40 MG/ML IJ SOLN
2.0000 g | Freq: Every day | INTRAMUSCULAR | Status: DC | PRN
  Filled 2013-04-19: qty 50

## 2013-04-19 MED ORDER — PHENOL 1.4 % MT LIQD: 1.0000 | OROMUCOSAL | Status: DC | PRN

## 2013-04-19 MED ORDER — OXYCODONE-ACETAMINOPHEN 5-325 MG PO TABS
1.0000 | ORAL_TABLET | ORAL | Status: DC | PRN
  Administered 2013-04-21 – 2013-04-23 (×4): 2 via ORAL
  Filled 2013-04-19 (×4): qty 2

## 2013-04-19 MED ORDER — BUDESONIDE-FORMOTEROL FUMARATE 160-4.5 MCG/ACT IN AERO
2.0000 | INHALATION_SPRAY | Freq: Two times a day (BID) | RESPIRATORY_TRACT | Status: DC
  Administered 2013-04-19 – 2013-04-23 (×8): 2 via RESPIRATORY_TRACT
  Filled 2013-04-19 (×2): qty 6

## 2013-04-19 MED ORDER — DOCUSATE SODIUM 100 MG PO CAPS
100.0000 mg | ORAL_CAPSULE | Freq: Every day | ORAL | Status: DC
  Administered 2013-04-20 – 2013-04-21 (×2): 100 mg via ORAL
  Filled 2013-04-19 (×3): qty 1

## 2013-04-19 MED ORDER — BISACODYL 10 MG RE SUPP: 10.0000 mg | Freq: Every day | RECTAL | Status: DC | PRN

## 2013-04-19 MED ORDER — PHENYLEPHRINE HCL 10 MG/ML IJ SOLN
INTRAMUSCULAR | Status: DC | PRN
  Administered 2013-04-19: 80 ug via INTRAVENOUS

## 2013-04-19 MED ORDER — FERROUS SULFATE 325 (65 FE) MG PO TABS
325.0000 mg | ORAL_TABLET | Freq: Every day | ORAL | Status: DC
  Administered 2013-04-19 – 2013-04-22 (×4): 325 mg via ORAL
  Filled 2013-04-19 (×6): qty 1

## 2013-04-19 MED ORDER — SODIUM CHLORIDE 0.9 % IR SOLN
Status: DC | PRN
  Administered 2013-04-19: 07:00:00

## 2013-04-19 MED ORDER — LOSARTAN POTASSIUM 50 MG PO TABS
100.0000 mg | ORAL_TABLET | Freq: Every day | ORAL | Status: DC
  Administered 2013-04-20 – 2013-04-23 (×4): 100 mg via ORAL
  Filled 2013-04-19 (×5): qty 2

## 2013-04-19 MED ORDER — SODIUM CHLORIDE 0.9 % IV SOLN: INTRAVENOUS | Status: DC

## 2013-04-19 MED ORDER — LIDOCAINE HCL (CARDIAC) 20 MG/ML IV SOLN
INTRAVENOUS | Status: DC | PRN
  Administered 2013-04-19: 50 mg via INTRAVENOUS

## 2013-04-19 MED ORDER — LABETALOL HCL 5 MG/ML IV SOLN
10.0000 mg | INTRAVENOUS | Status: DC | PRN
  Filled 2013-04-19: qty 4

## 2013-04-19 MED ORDER — HEPARIN SODIUM (PORCINE) 1000 UNIT/ML IJ SOLN
INTRAMUSCULAR | Status: AC
  Filled 2013-04-19: qty 1

## 2013-04-19 MED ORDER — PAPAVERINE HCL 30 MG/ML IJ SOLN
INTRAMUSCULAR | Status: AC
  Filled 2013-04-19: qty 2

## 2013-04-19 MED ORDER — ONDANSETRON HCL 4 MG/2ML IJ SOLN
INTRAMUSCULAR | Status: DC | PRN
  Administered 2013-04-19: 4 mg via INTRAVENOUS

## 2013-04-19 MED ORDER — ONDANSETRON HCL 4 MG/2ML IJ SOLN
4.0000 mg | Freq: Four times a day (QID) | INTRAMUSCULAR | Status: DC | PRN
  Administered 2013-04-20 – 2013-04-21 (×3): 4 mg via INTRAVENOUS
  Filled 2013-04-19 (×4): qty 2

## 2013-04-19 MED ORDER — PROTAMINE SULFATE 10 MG/ML IV SOLN
INTRAVENOUS | Status: AC
  Filled 2013-04-19: qty 5

## 2013-04-19 MED ORDER — ALUM & MAG HYDROXIDE-SIMETH 200-200-20 MG/5ML PO SUSP: 15.0000 mL | ORAL | Status: DC | PRN

## 2013-04-19 MED ORDER — DOPAMINE-DEXTROSE 3.2-5 MG/ML-% IV SOLN: 3.0000 ug/kg/min | INTRAVENOUS | Status: DC

## 2013-04-19 MED ORDER — LACTATED RINGERS IV SOLN
INTRAVENOUS | Status: DC | PRN
  Administered 2013-04-19 (×2): via INTRAVENOUS

## 2013-04-19 MED ORDER — ASPIRIN EC 325 MG PO TBEC
325.0000 mg | DELAYED_RELEASE_TABLET | Freq: Every day | ORAL | Status: DC
  Administered 2013-04-20 – 2013-04-23 (×4): 325 mg via ORAL
  Filled 2013-04-19 (×4): qty 1

## 2013-04-19 MED ORDER — ACETAMINOPHEN 650 MG RE SUPP: 325.0000 mg | RECTAL | Status: DC | PRN

## 2013-04-19 MED ORDER — GUAIFENESIN-DM 100-10 MG/5ML PO SYRP: 15.0000 mL | ORAL_SOLUTION | ORAL | Status: DC | PRN

## 2013-04-19 MED ORDER — PHENYLEPHRINE HCL 10 MG/ML IJ SOLN
10.0000 mg | INTRAMUSCULAR | Status: DC | PRN
  Administered 2013-04-19: 10 ug/min via INTRAVENOUS

## 2013-04-19 MED ORDER — LABETALOL HCL 5 MG/ML IV SOLN: 10.0000 mg | INTRAVENOUS | Status: DC | PRN

## 2013-04-19 MED ORDER — THROMBIN 20000 UNITS EX SOLR
CUTANEOUS | Status: DC | PRN
  Administered 2013-04-19: 12:00:00 via TOPICAL

## 2013-04-19 MED ORDER — PROPOFOL 10 MG/ML IV BOLUS
INTRAVENOUS | Status: DC | PRN
  Administered 2013-04-19: 150 mg via INTRAVENOUS
  Administered 2013-04-19: 30 mg via INTRAVENOUS

## 2013-04-19 MED ORDER — HYDROMORPHONE HCL PF 1 MG/ML IJ SOLN
0.5000 mg | INTRAMUSCULAR | Status: DC | PRN
  Administered 2013-04-19: 0.5 mg via INTRAVENOUS
  Administered 2013-04-19: 1 mg via INTRAVENOUS
  Administered 2013-04-20: 0.5 mg via INTRAVENOUS
  Administered 2013-04-20: 1 mg via INTRAVENOUS
  Administered 2013-04-20 (×2): 0.5 mg via INTRAVENOUS
  Administered 2013-04-20 – 2013-04-21 (×3): 1 mg via INTRAVENOUS
  Filled 2013-04-19 (×9): qty 1

## 2013-04-19 MED ORDER — GLYCOPYRROLATE 0.2 MG/ML IJ SOLN
INTRAMUSCULAR | Status: DC | PRN
  Administered 2013-04-19: 0.4 mg via INTRAVENOUS
  Administered 2013-04-19: 0.1 mg via INTRAVENOUS

## 2013-04-19 MED ORDER — GLYCOPYRROLATE 0.2 MG/ML IJ SOLN
INTRAMUSCULAR | Status: AC
  Filled 2013-04-19: qty 3

## 2013-04-19 MED ORDER — METOPROLOL SUCCINATE ER 25 MG PO TB24
25.0000 mg | ORAL_TABLET | Freq: Every day | ORAL | Status: DC
  Administered 2013-04-20 – 2013-04-23 (×4): 25 mg via ORAL
  Filled 2013-04-19 (×4): qty 1

## 2013-04-19 MED ORDER — PANTOPRAZOLE SODIUM 40 MG PO TBEC
80.0000 mg | DELAYED_RELEASE_TABLET | Freq: Every day | ORAL | Status: DC
  Administered 2013-04-20 – 2013-04-23 (×4): 80 mg via ORAL
  Filled 2013-04-19 (×4): qty 2

## 2013-04-19 MED ORDER — ROCURONIUM BROMIDE 50 MG/5ML IV SOLN
INTRAVENOUS | Status: AC
  Filled 2013-04-19: qty 1

## 2013-04-19 MED ORDER — INSULIN GLARGINE 100 UNIT/ML ~~LOC~~ SOLN
16.0000 [IU] | Freq: Every day | SUBCUTANEOUS | Status: DC
  Administered 2013-04-19 – 2013-04-22 (×4): 16 [IU] via SUBCUTANEOUS
  Filled 2013-04-19 (×6): qty 0.16

## 2013-04-19 MED ORDER — MAGNESIUM SULFATE 40 MG/ML IJ SOLN: 2.0000 g | Freq: Every day | INTRAMUSCULAR | Status: DC | PRN

## 2013-04-19 MED ORDER — IPRATROPIUM-ALBUTEROL 20-100 MCG/ACT IN AERS: 2.0000 | INHALATION_SPRAY | Freq: Four times a day (QID) | RESPIRATORY_TRACT | Status: DC | PRN

## 2013-04-19 MED ORDER — GLYCOPYRROLATE 0.2 MG/ML IJ SOLN
INTRAMUSCULAR | Status: AC
  Filled 2013-04-19: qty 1

## 2013-04-19 MED ORDER — IPRATROPIUM-ALBUTEROL 0.5-2.5 (3) MG/3ML IN SOLN: 3.0000 mL | Freq: Four times a day (QID) | RESPIRATORY_TRACT | Status: DC | PRN

## 2013-04-19 MED ORDER — ENOXAPARIN SODIUM 40 MG/0.4ML ~~LOC~~ SOLN: 40.0000 mg | SUBCUTANEOUS | Status: DC

## 2013-04-19 MED ORDER — SODIUM CHLORIDE 0.9 % IV SOLN: 500.0000 mL | Freq: Once | INTRAVENOUS | Status: AC | PRN

## 2013-04-19 MED ORDER — DEXTROSE 5 % IV SOLN
1.5000 g | Freq: Two times a day (BID) | INTRAVENOUS | Status: AC
  Administered 2013-04-19 – 2013-04-20 (×2): 1.5 g via INTRAVENOUS
  Filled 2013-04-19 (×2): qty 1.5

## 2013-04-19 MED ORDER — ALBUTEROL SULFATE (2.5 MG/3ML) 0.083% IN NEBU: 2.5000 mg | INHALATION_SOLUTION | Freq: Three times a day (TID) | RESPIRATORY_TRACT | Status: DC | PRN

## 2013-04-19 MED ORDER — DOCUSATE SODIUM 100 MG PO CAPS: 100.0000 mg | ORAL_CAPSULE | Freq: Every day | ORAL | Status: DC

## 2013-04-19 MED ORDER — INSULIN GLARGINE 100 UNIT/ML SOLOSTAR PEN: 16.0000 [IU] | PEN_INJECTOR | Freq: Every day | SUBCUTANEOUS | Status: DC

## 2013-04-19 MED ORDER — ONDANSETRON HCL 4 MG/2ML IJ SOLN: 4.0000 mg | Freq: Four times a day (QID) | INTRAMUSCULAR | Status: DC | PRN

## 2013-04-19 MED ORDER — FAMOTIDINE 20 MG PO TABS
20.0000 mg | ORAL_TABLET | Freq: Every day | ORAL | Status: DC
  Administered 2013-04-20 – 2013-04-23 (×4): 20 mg via ORAL
  Filled 2013-04-19 (×5): qty 1

## 2013-04-19 MED ORDER — ALBUMIN HUMAN 5 % IV SOLN
INTRAVENOUS | Status: DC | PRN
  Administered 2013-04-19: 11:00:00 via INTRAVENOUS

## 2013-04-19 MED ORDER — ONDANSETRON HCL 4 MG/2ML IJ SOLN
INTRAMUSCULAR | Status: AC
  Filled 2013-04-19: qty 2

## 2013-04-19 MED ORDER — ARTIFICIAL TEARS OP OINT
TOPICAL_OINTMENT | OPHTHALMIC | Status: DC | PRN
  Administered 2013-04-19: 1 via OPHTHALMIC

## 2013-04-19 MED ORDER — 0.9 % SODIUM CHLORIDE (POUR BTL) OPTIME
TOPICAL | Status: DC | PRN
Start: 2013-04-19 — End: 2013-04-19
  Administered 2013-04-19: 2000 mL

## 2013-04-19 MED ORDER — NEOSTIGMINE METHYLSULFATE 1 MG/ML IJ SOLN
INTRAMUSCULAR | Status: DC | PRN
  Administered 2013-04-19: 2 mg via INTRAVENOUS

## 2013-04-19 MED ORDER — SENNOSIDES-DOCUSATE SODIUM 8.6-50 MG PO TABS
1.0000 | ORAL_TABLET | Freq: Every evening | ORAL | Status: DC | PRN
  Filled 2013-04-19: qty 1

## 2013-04-19 SURGICAL SUPPLY — 81 items
ADH SKN CLS APL DERMABOND .7 (GAUZE/BANDAGES/DRESSINGS) ×2
BANDAGE ELASTIC 4 VELCRO ST LF (GAUZE/BANDAGES/DRESSINGS) ×2 IMPLANT
BANDAGE ESMARK 6X9 LF (GAUZE/BANDAGES/DRESSINGS) IMPLANT
BNDG CMPR 9X6 STRL LF SNTH (GAUZE/BANDAGES/DRESSINGS)
BNDG ESMARK 6X9 LF (GAUZE/BANDAGES/DRESSINGS)
CANISTER SUCTION 2500CC (MISCELLANEOUS) ×3 IMPLANT
CANNULA VESSEL 3MM 2 BLNT TIP (CANNULA) ×3 IMPLANT
CATH EMB 4FR 80CM (CATHETERS) ×2 IMPLANT
CLIP TI MEDIUM 24 (CLIP) ×3 IMPLANT
CLIP TI WIDE RED SMALL 24 (CLIP) ×5 IMPLANT
CONT SPEC 4OZ CLIKSEAL STRL BL (MISCELLANEOUS) ×2 IMPLANT
COVER SURGICAL LIGHT HANDLE (MISCELLANEOUS) ×3 IMPLANT
CUFF TOURNIQUET SINGLE 24IN (TOURNIQUET CUFF) ×2 IMPLANT
CUFF TOURNIQUET SINGLE 34IN LL (TOURNIQUET CUFF) IMPLANT
CUFF TOURNIQUET SINGLE 44IN (TOURNIQUET CUFF) IMPLANT
DERMABOND ADVANCED (GAUZE/BANDAGES/DRESSINGS) ×4
DERMABOND ADVANCED .7 DNX12 (GAUZE/BANDAGES/DRESSINGS) IMPLANT
DRAIN CHANNEL 15F RND FF W/TCR (WOUND CARE) ×4 IMPLANT
DRAPE PROXIMA HALF (DRAPES) ×2 IMPLANT
DRAPE WARM FLUID 44X44 (DRAPE) ×3 IMPLANT
DRAPE X-RAY CASS 24X20 (DRAPES) IMPLANT
DRSG COVADERM 4X10 (GAUZE/BANDAGES/DRESSINGS) IMPLANT
DRSG COVADERM 4X6 (GAUZE/BANDAGES/DRESSINGS) ×2 IMPLANT
DRSG COVADERM 4X8 (GAUZE/BANDAGES/DRESSINGS) IMPLANT
ELECT REM PT RETURN 9FT ADLT (ELECTROSURGICAL) ×3
ELECTRODE REM PT RTRN 9FT ADLT (ELECTROSURGICAL) ×1 IMPLANT
EVACUATOR SILICONE 100CC (DRAIN) ×4 IMPLANT
GAUZE SPONGE 4X4 16PLY XRAY LF (GAUZE/BANDAGES/DRESSINGS) ×4 IMPLANT
GLOVE BIO SURGEON STRL SZ 6.5 (GLOVE) ×1 IMPLANT
GLOVE BIO SURGEON STRL SZ7.5 (GLOVE) ×3 IMPLANT
GLOVE BIO SURGEONS STRL SZ 6.5 (GLOVE) ×1
GLOVE BIOGEL PI IND STRL 6.5 (GLOVE) IMPLANT
GLOVE BIOGEL PI IND STRL 7.0 (GLOVE) IMPLANT
GLOVE BIOGEL PI IND STRL 7.5 (GLOVE) IMPLANT
GLOVE BIOGEL PI IND STRL 8 (GLOVE) ×1 IMPLANT
GLOVE BIOGEL PI INDICATOR 6.5 (GLOVE) ×4
GLOVE BIOGEL PI INDICATOR 7.0 (GLOVE) ×6
GLOVE BIOGEL PI INDICATOR 7.5 (GLOVE) ×2
GLOVE BIOGEL PI INDICATOR 8 (GLOVE) ×4
GLOVE ECLIPSE 6.5 STRL STRAW (GLOVE) ×6 IMPLANT
GLOVE SS BIOGEL STRL SZ 6.5 (GLOVE) IMPLANT
GLOVE SS BIOGEL STRL SZ 7 (GLOVE) IMPLANT
GLOVE SUPERSENSE BIOGEL SZ 6.5 (GLOVE) ×2
GLOVE SUPERSENSE BIOGEL SZ 7 (GLOVE) ×2
GLOVE SURG SS PI 6.5 STRL IVOR (GLOVE) ×2 IMPLANT
GOWN STRL REUS W/ TWL LRG LVL3 (GOWN DISPOSABLE) ×3 IMPLANT
GOWN STRL REUS W/TWL LRG LVL3 (GOWN DISPOSABLE) ×18
KIT BASIN OR (CUSTOM PROCEDURE TRAY) ×3 IMPLANT
KIT ROOM TURNOVER OR (KITS) ×3 IMPLANT
MARKER GRAFT CORONARY BYPASS (MISCELLANEOUS) ×2 IMPLANT
NS IRRIG 1000ML POUR BTL (IV SOLUTION) ×6 IMPLANT
PACK PERIPHERAL VASCULAR (CUSTOM PROCEDURE TRAY) ×3 IMPLANT
PAD ARMBOARD 7.5X6 YLW CONV (MISCELLANEOUS) ×6 IMPLANT
PADDING CAST COTTON 6X4 STRL (CAST SUPPLIES) ×2 IMPLANT
PATCH VASCULAR VASCU GUARD 1X6 (Vascular Products) ×2 IMPLANT
SET COLLECT BLD 21X3/4 12 (NEEDLE) IMPLANT
SPONGE GAUZE 4X4 12PLY (GAUZE/BANDAGES/DRESSINGS) ×2 IMPLANT
SPONGE GAUZE 4X4 12PLY STER LF (GAUZE/BANDAGES/DRESSINGS) ×4 IMPLANT
SPONGE INTESTINAL PEANUT (DISPOSABLE) ×2 IMPLANT
SPONGE LAP 18X18 X RAY DECT (DISPOSABLE) ×4 IMPLANT
SPONGE SURGIFOAM ABS GEL 100 (HEMOSTASIS) IMPLANT
STAPLER VISISTAT 35W (STAPLE) IMPLANT
STOPCOCK 4 WAY LG BORE MALE ST (IV SETS) ×2 IMPLANT
SUT ETHILON 3 0 PS 1 (SUTURE) ×4 IMPLANT
SUT PROLENE 5 0 C 1 24 (SUTURE) ×9 IMPLANT
SUT PROLENE 6 0 BV (SUTURE) ×11 IMPLANT
SUT PROLENE 7 0 BV 1 (SUTURE) ×2 IMPLANT
SUT SILK 2 0 FS (SUTURE) ×3 IMPLANT
SUT SILK 3 0 (SUTURE) ×3
SUT SILK 3-0 18XBRD TIE 12 (SUTURE) IMPLANT
SUT VIC AB 2-0 CTB1 (SUTURE) ×8 IMPLANT
SUT VIC AB 3-0 SH 27 (SUTURE) ×15
SUT VIC AB 3-0 SH 27X BRD (SUTURE) ×2 IMPLANT
SUT VICRYL 4-0 PS2 18IN ABS (SUTURE) ×12 IMPLANT
SYR 3ML LL SCALE MARK (SYRINGE) ×2 IMPLANT
TOWEL OR 17X24 6PK STRL BLUE (TOWEL DISPOSABLE) ×6 IMPLANT
TOWEL OR 17X26 10 PK STRL BLUE (TOWEL DISPOSABLE) ×6 IMPLANT
TRAY FOLEY CATH 16FRSI W/METER (SET/KITS/TRAYS/PACK) ×3 IMPLANT
TUBING EXTENTION W/L.L. (IV SETS) IMPLANT
UNDERPAD 30X30 INCONTINENT (UNDERPADS AND DIAPERS) ×3 IMPLANT
WATER STERILE IRR 1000ML POUR (IV SOLUTION) ×3 IMPLANT

## 2013-04-19 NOTE — Interval H&P Note (Signed)
History and Physical Interval Note:  04/19/2013 7:26 AM  Suzanne [name]  has presented today for surgery, with the diagnosis of Peripheral vascular disease  The various methods of treatment have been discussed with the patient and family. After consideration of risks, benefits and other options for treatment, the patient has consented to  Procedure(s): [place] (Left) BYPASS GRAFT FEMORAL- BELOW KNEE POPLITEAL ARTERY WITH VEIN (Left) as a surgical intervention .  The patient's history has been reviewed, patient examined, no change in status, stable for surgery.  I have reviewed the patient's chart and labs.  Questions were answered to the patient's satisfaction.     DICKSON,CHRISTOPHER S

## 2013-04-19 NOTE — Op Note (Signed)
NAME: Suzanne [name]   MRN: [id number] DOB: 22-Jul-1921    DATE OF OPERATION: 04/19/2013  PREOP DIAGNOSIS: nonhealing wound left foot with multilevel arterial occlusive disease  POSTOP DIAGNOSIS: same  PROCEDURE:  1. Endarterectomy of left common femoral artery with bovine pericardial patch angioplasty of distal left limb of aortofemoral bypass graft 2. Left femoral to below knee popliteal artery bypass with non-reversed translocated saphenous vein graft  SURGEON: Judeth Cornfield. Scot Dock, MD, FACS  ASSIST: Gerri Lins PA ,Carlyn Reichert RNFA  ANESTHESIA: Gen.   EBL: per anesthesia record  INDICATIONS: Suzanne [name] is a 80 y.o. female who presented with nonhealing wounds of her left foot. She had undergone previous aortobifemoral bypass graft for aneurysmal disease by Dr. Amedeo Plenty. She underwent an arteriogram by Dr. Quay Burow which showed a tight stenosis in the common femoral artery with a large calcific plaque present. She had severe diffuse disease of her distal superficial femoral artery and above-knee popliteal artery. It was felt that revascularization was her best option for limb salvage and she presents for surgery.  FINDINGS: there was a large calcific plaque in the common femoral artery which was endarterectomized. The graft was anastomosed to the common femoral artery fairly high under the inguinal ligament. I did not perform an intraoperative arteriogram as her creatinine was 2.0.  TECHNIQUE: The patient was brought to the operating room and received a general anesthetic. Monitoring lines had been placed by anesthesia. The left groin and entire left lower extremity were prepped and draped in usual sterile fashion. A longitudinal incision was made in the left groin at the site of the previous incision. The left limb of the graft and artery were somewhat medial to this incision. The dissection was carried down through dense scar tissue and I was able to control the  superficial femoral artery and deep femoral artery distally. The graft was then controlled and the native artery controlled. It had diffuse calcific disease. The patient was heparinized. The arteries were controlled and a longitudinal graftotomy made over the hood of the left limb of the aortofemoral bypass graft. Some laminated clot was removed from the graft and a large calcific plaque was endarterectomized. The deep femoral artery and superficial femoral artery were patent. A bovine pericardial patch was then tailored and used to close the graftotomy with running 5-0 Prolene suture. At the completion there was a improved but monophasic anterior tibial signal with the Doppler.  I elected to proceed with left femoropopliteal bypass grafting. The saphenofemoral junction was dissected free with branches divided between clips and thrill silk ties. Using 3 additional incisions along the medial aspect of the left leg the greater saphenous vein was harvested to the proximal calf. Wrenches were divided between clips and 3-0 silk ties. Through the distal incision the below-knee popliteal artery was exposed and was patent at this level. The vein was ligated distally and then removed from its bed entirely. It was ligated at the saphenofemoral junction. This was a set of heparinized saline became fairly small distally. Is not sure they would be adequate length of vein and therefore elected to originate the graft off the superficial femoral artery so that I would be able to excise the very small segment distally. With the use of vein in a non-reversed fashion. Superficial femoral artery was clamped proximally and distally and a longitudinal arteriotomy was made. The vein was spatulated and sewn end to side to the artery using continuous 6-0 Prolene suture. I then used a  retrograde Jerelene Redden valvulotome to lyse the valves and established flow through the vein graft. This was flushed with heparinized saline clipped and then marked  to prevent twisting. He was brought to the previously created tunnel from the below-knee incision to the groin incision. Tourniquet was placed on the thigh and the leg exsanguinated with an Esmarch bandage. The tourniquet was inflated to 250 mm mercury. Under tourniquet control, a longitudinal arteriotomy was made in the below-knee popliteal artery. The vein was spatulated and sewn end to side to the artery using continuous 6-0 Prolene suture. Prior to completing this anastomosis the artery was back flushed properly after the tourniquet was released. The anastomosis was completed. There was an excellent anterior tibial signal at the completion of the procedure. 2:15 Blake drains were placed. The 2 thigh incisions and the incision below the knee were closed with 2 deep layers of 3-0 Vicryl the skin closed with 4-0 Vicryl. After hemostasis was obtained in the groin incision this was closed with a deep layer of 2-0 Vicryl, 2 subcutaneous layers of 3-0 Vicryl the skin closed with 4-0 subcuticular. Of note I had placed a radiopaque marker around the proximal vein graft where it originated from the superficial femoral artery.  All needle and sponge counts were correct. The patient tolerated the procedure well and was transferred to the recovery room in stable condition.   [name], MD, FACS [facility]  DATE OF DICTATION:   04/19/2013

## 2013-04-19 NOTE — Progress Notes (Signed)
Noted left heel black eschar 1cm x1cm, left lateral heel black fissure 0.5cmx1cm,mutliple little fissures on top of foot.

## 2013-04-19 NOTE — Anesthesia Procedure Notes (Signed)
Procedure Name: Intubation Date/Time: 04/19/2013 7:45 AM Performed by: Neldon Newport Pre-anesthesia Checklist: Patient identified, Emergency Drugs available, Suction available, Patient being monitored and Timeout performed Patient Re-evaluated:Patient Re-evaluated prior to inductionOxygen Delivery Method: Circle system utilized Preoxygenation: Pre-oxygenation with 100% oxygen Intubation Type: IV induction Ventilation: Mask ventilation without difficulty Laryngoscope Size: Mac and 3 Grade View: Grade I Tube type: Oral Number of attempts: 1 Placement Confirmation: ETT inserted through vocal cords under direct vision,  positive ETCO2 and breath sounds checked- equal and bilateral Tube secured with: Tape Dental Injury: Teeth and Oropharynx as per pre-operative assessment

## 2013-04-19 NOTE — Anesthesia Postprocedure Evaluation (Signed)
  Anesthesia Post-op Note  Patient: Suzanne [name]  Procedure(s) Performed: Procedure(s): VEIN PATCH ANGIOPLASTY - LEFT COMMON FEMORAL ARTERY (Left) BYPASS GRAFT FEMORAL- BELOW KNEE POPLITEAL ARTERY WITH VEIN (Left)  Patient Location: PACU  Anesthesia Type:General  Level of Consciousness: awake  Airway and Oxygen Therapy: Patient Spontanous Breathing  Post-op Pain: mild  Post-op Assessment: Post-op Vital signs reviewed  Post-op Vital Signs: Reviewed  Complications: No apparent anesthesia complications

## 2013-04-19 NOTE — H&P (View-Only) (Signed)
[facility] [place]  Patient name: Suzanne [name] MRN: [id number] DOB: Nov 21, 1921 Sex: female  REASON FOR CONSULT: Peripheral vascular disease with nonhealing wounds of the left foot. Referred by Dr. Quay Burow.  HPI: Suzanne [name] is a 79 y.o. female who developed a crack on her left heel back in May of 2014. She has subsequently developed several other small wounds on the left foot. She underwent an arteriogram by Dr. Gwenlyn Found which showed a tight stenosis in the common femoral artery at the distal aspect of the left limb of her aortobifemoral bypass graft. She was sent for vascular consultation.  She underwent repair of an infrarenal abdominal aortic aneurysm with an aortobifemoral bypass graft by Dr. Amedeo Plenty in 1998. She's had multiple revascularization attempts in the right leg. She currently has some mild claudication bilaterally but however her activity is fairly limited by her pulmonary status.  Her risk factors for peripheral vascular disease include diabetes, hypertension, hypercholesterolemia, and a remote history of tobacco use.  Past Medical History  Diagnosis Date  . Type II or unspecified type diabetes mellitus without mention of complication, not stated as uncontrolled   . PVD (peripheral vascular disease)     a. PTA & stent right superficial artery PTA & left common iliac artery stenting. Right fem-pop bypass graft. b. Per Dr. Kennon Holter note: h/o aortobifemoral bypass grafting remotely as well as fem-peroneal bypass grafting.  . Mixed hyperlipidemia   . Renal artery stenosis     a. 1-59% by doppler in 2014.  [name] Compression fx, thoracic spine     T12  . Ischium fracture   . Memory loss, short term   . Pulmonary nodule   . CAD (coronary artery disease) 10/12/2008    a. 30% LAD,30% CX & 40% tandem mid stenosis in the AV groove, 50-60% RCA  . Chronic diastolic CHF (congestive heart failure) 09-2008    a. EF 40% by cath 2010. b. Improved - last echo 2015  with normal EF.  [name] Anemia   . COPD (chronic obstructive pulmonary disease)   . Detached retina 02-2005    R EYE  . Fracture 02-2007    L5 AND PELVIS  . COPD (chronic obstructive pulmonary disease)   . Left ventricular dysfunction     a. EF 40% by cath 2010. b. Improved - last echo 2015 with normal EF.   [name] AAA (abdominal aortic aneurysm)     Repair unknown date  . Critical lower limb ischemia   . Chronic respiratory failure     a. On home O2.  [name] Hypertension   . Warfarin anticoagulation     DC summary from 2010 reports she is "on chronic  Coumadin for vascular disease." This is listed on her med list going back to 2006 by records available. Teaching service H&P remotely says she has h/o DVT but I cannot find any evidence of this. Admission H/P 03/2013 suggests history of atrial fib but all ECGs appear sinus and there is no mention of this previously.   Family History  Problem Relation Age of Onset  . Tuberculosis Mother   . Cancer Father    SOCIAL HISTORY: History  Substance Use Topics  . Smoking status: Former Smoker    Types: Cigarettes    Quit date: 02/16/1997  . Smokeless tobacco: Never Used  . Alcohol Use: No   Allergies  Allergen Reactions  . Codeine Other (See Comments)    hallucinations  . Hydrochlorothiazide Other (See Comments)  hypotension  . Other     Adhesives and bandages gives pt a rash.  . Pregabalin Other (See Comments)    unknown   Current Outpatient Prescriptions  Medication Sig Dispense Refill  . acetaminophen (TYLENOL) 500 MG tablet Take 1,000 mg by mouth every 4 (four) hours as needed (pain).       [name] albuterol (PROVENTIL) (2.5 MG/3ML) 0.083% nebulizer solution Take 2.5 mg by nebulization See admin instructions. Use twice daily and a third time as needed for shortness of breath or wheezing      . aspirin EC 325 MG EC tablet Take 1 tablet (325 mg total) by mouth daily.      . budesonide-formoterol (SYMBICORT) 160-4.5 MCG/ACT inhaler Inhale 2 puffs  into the lungs 2 (two) times daily.      . Calcium Carb-Cholecalciferol (CALCIUM 600 + D PO) Take 600 mg by mouth daily.       [name] esomeprazole (NEXIUM) 40 MG capsule Take 40 mg by mouth daily before breakfast.      . ferrous sulfate 325 (65 FE) MG tablet Take 325 mg by mouth at bedtime.      . furosemide (LASIX) 40 MG tablet Take 40 mg by mouth daily.      [name] HYDROcodone-acetaminophen (NORCO) 7.5-325 MG per tablet Take 1 tablet by mouth every 4 (four) hours as needed.  40 tablet  0  . ibandronate (BONIVA) 150 MG tablet Take 150 mg by mouth every 30 (thirty) days. Take in the morning with a full glass of water, on an empty stomach, and do not take anything else by mouth or lie down for the next 30 min. (take on the 25th of each month)      . Insulin Glargine (LANTUS) 100 UNIT/ML Solostar Pen Inject 16 Units into the skin daily at 10 pm.      . Ipratropium-Albuterol (COMBIVENT RESPIMAT) 20-100 MCG/ACT AERS respimat Inhale 2 puffs into the lungs every 6 (six) hours as needed for wheezing or shortness of breath.       . lidocaine (LIDODERM) 5 % Place 1 patch onto the skin daily. Remove & Discard patch within 12 hours or as directed by MD      . losartan (COZAAR) 100 MG tablet Take 100 mg by mouth daily.      . metoprolol succinate (TOPROL-XL) 25 MG 24 hr tablet Take 25 mg by mouth daily.       . Multiple Vitamin (MULTIVITAMIN WITH MINERALS) TABS tablet Take 1 tablet by mouth at bedtime.      . Omega-3 Fatty Acids (FISH OIL) 1200 MG CAPS Take 1,200 mg by mouth 2 (two) times daily.      . ranitidine (ZANTAC) 150 MG tablet Take 150 mg by mouth 2 (two) times daily.      . rosuvastatin (CRESTOR) 20 MG tablet Take 20 mg by mouth daily. PATIENT NEEDS OFFICE VISIT/LABS FOR ADDITIONAL REFILLS       No current facility-administered medications for this visit.   REVIEW OF SYSTEMS: Valu.Nieves ] denotes positive finding; [  ] denotes negative finding  CARDIOVASCULAR:  [ ]  chest pain   [ ]  chest pressure   [ ]  palpitations    [ ]  orthopnea   Valu.Nieves ] dyspnea on exertion   Valu.Nieves ] claudication   Valu.Nieves ] rest pain   Valu.Nieves ] DVT   [ ]  phlebitis PULMONARY:   [ ]  productive cough   Valu.Nieves ] asthma   Valu.Nieves ] wheezing She is  on home oxygen at 2.5 L. NEUROLOGIC:   [ ]  weakness  [ ]  paresthesias  [ ]  aphasia  [ ]  amaurosis  [ ]  dizziness HEMATOLOGIC:   [ ]  bleeding problems   [ ]  clotting disorders MUSCULOSKELETAL:  [ ]  joint pain   [ ]  joint swelling Valu.Nieves ] leg swelling GASTROINTESTINAL: [ ]   blood in stool  [ ]   hematemesis GENITOURINARY:  [ ]   dysuria  [ ]   hematuria PSYCHIATRIC:  [ ]  history of major depression INTEGUMENTARY:  [ ]  rashes  [ ]  ulcers CONSTITUTIONAL:  [ ]  fever   [ ]  chills  PHYSICAL EXAM: Filed Vitals:   04/13/13 1558  BP: 156/54  Pulse: 86  Height: 5\' 2"  (1.575 m)  Weight: 142 lb (64.411 kg)  SpO2: 95%   Body mass index is 25.97 kg/(m^2). GENERAL: The patient is a well-nourished female, in no acute distress. The vital signs are documented above. CARDIOVASCULAR: There is a regular rate and rhythm. She has a left carotid bruit. She has a palpable right femoral pulse. She has a diminished left femoral pulse. I cannot palpate popliteal or pedal pulses on either side. She has no significant lower extremity swelling. PULMONARY: There is good air exchange bilaterally without wheezing or rales. ABDOMEN: Soft and non-tender with normal pitched bowel sounds.  MUSCULOSKELETAL: There are no major deformities or cyanosis. NEUROLOGIC: No focal weakness or paresthesias are detected. SKIN: there is an ulcer on her left heel that measures approximately a centimeter in diameter. There were several small cracks in her left foot and some mild erythema. PSYCHIATRIC: The patient has a normal affect.  DATA:  Have reviewed her arteriogram that was performed by Dr. Gwenlyn Found. She has a tight stenosis in her distal common femoral artery on the left at the distal left limb of her aortobifemoral bypass graft. She has moderate to severe disease of  the distal superficial femoral artery. The below-knee popliteal artery is patent with a dominant runoff being the anterior tibial artery.  I have independently interpreted her vein mapping which shows that she has a reasonable greater saphenous vein in the left lower extremity.  MEDICAL ISSUES:  Atherosclerotic PVD with ulceration Patient has nonhealing wounds of the left foot with multilevel arterial occlusive disease. She has a tight stenosis of the distal left common femoral artery and the distal aspect of the left limb of her aortobifemoral bypass graft. In addition she has significant disease of the distal superficial femoral artery on the left. Given the nonhealing wounds on her left foot this is clearly a limb threatening situation. I think without revascularization the wounds would not heal and she would require a primary amputation. By her age and multiple medical conditions she is very with it and mentally very sharp. She lives at home. I have recommended that we addressed the common femoral artery stenosis with endarterectomy and vein patch and also potentially proceed with left femoral to below knee popliteal artery bypass with a vein graft as her best chance of limb salvage. Certainly this will be associated with some slightly increased risk given her age, cardiac history, and pulmonary status. She understands these risk however and wishes to proceed. I have reviewed the indications for lower extremity bypass. I have also reviewed the potential complications of surgery including but not limited to: wound healing problems, infection, graft thrombosis, limb loss, or other unpredictable medical problems. All the patient's questions were answered and they are agreeable to proceed. Her surgery is scheduled for  04/19/2013.      [place] [facility]  [place]: [phone]

## 2013-04-19 NOTE — Progress Notes (Signed)
   VASCULAR PROGRESS NOTE  SUBJECTIVE: Resting comfortably  PHYSICAL EXAM: Filed Vitals:   04/19/13 1345 04/19/13 1400 04/19/13 1415 04/19/13 1436  BP: 148/47 139/56  124/55  Pulse: 59 62 61 64  Temp:   97.6 F (36.4 C) 97.5 F (36.4 C)  TempSrc:    Oral  Resp: 14 15 15 13   Height:    5\' 2"  (1.575 m)  Weight:    147 lb 7.8 oz (66.9 kg)  SpO2: 100% 100% 100% 99%   Brisk doppler left ATA  LABS: Lab Results  Component Value Date   WBC 15.3* 04/19/2013   HGB 10.8* 04/19/2013   HCT 32.7* 04/19/2013   MCV 87.7 04/19/2013   PLT 151 04/19/2013   Lab Results  Component Value Date   CREATININE 2.01* 04/18/2013   Lab Results  Component Value Date   INR 1.05 04/18/2013   PROTIME 29.3* 08/01/2011   CBG (last 3)   Recent Labs  04/19/13 1031 04/19/13 1314  GLUCAP 154* 146*    Active Problems:   PAD (peripheral artery disease)   Atherosclerotic PVD with ulceration   ASSESSMENT AND PLAN:  * Stable postop   * Begin daily soaks with luke warm dial soap tomorrow and dry dressing changes for now.  * will need PTx and SNF vs HHPT  Gae Gallop Beeper: [phone] 04/19/2013

## 2013-04-19 NOTE — Anesthesia Preprocedure Evaluation (Addendum)
Anesthesia Evaluation  Patient identified by MRN, date of birth, ID band Patient awake    Reviewed: Allergy & Precautions, H&P , NPO status , Patient's Chart, lab work & pertinent test results, reviewed documented beta blocker date and time   Airway Mallampati: II TM Distance: >3 FB     Dental  (+) Edentulous Lower, Edentulous Upper, Dental Advidsory Given   Pulmonary COPDformer smoker,          Cardiovascular hypertension, + CAD, + Peripheral Vascular Disease and +CHF     Neuro/Psych    GI/Hepatic   Endo/Other  diabetes  Renal/GU Renal disease     Musculoskeletal   Abdominal   Peds  Hematology  (+) anemia ,   Anesthesia Other Findings   Reproductive/Obstetrics                          Anesthesia Physical Anesthesia Plan  ASA: III  Anesthesia Plan: General   Post-op Pain Management:    Induction: Intravenous  Airway Management Planned: Oral ETT  Additional Equipment:   Intra-op Plan:   Post-operative Plan:   Informed Consent:   Dental Advisory Given  Plan Discussed with: CRNA and Anesthesiologist  Anesthesia Plan Comments:        Anesthesia Quick Evaluation

## 2013-04-19 NOTE — Preoperative (Signed)
Beta Blockers   Reason not to administer Beta Blockers:Not Applicable 

## 2013-04-19 NOTE — Transfer of Care (Signed)
Immediate Anesthesia Transfer of Care Note  Patient: Suzanne [name]  Procedure(s) Performed: Procedure(s): VEIN PATCH ANGIOPLASTY - LEFT COMMON FEMORAL ARTERY (Left) BYPASS GRAFT FEMORAL- BELOW KNEE POPLITEAL ARTERY WITH VEIN (Left)  Patient Location: PACU  Anesthesia Type:General  Level of Consciousness: sedated, patient cooperative, lethargic and responds to stimulation  Airway & Oxygen Therapy: Patient Spontanous Breathing and Patient connected to nasal cannula oxygen  Post-op Assessment: Report given to PACU RN, Post -op Vital signs reviewed and stable and Patient moving all extremities X 4  Post vital signs: Reviewed and stable  Complications: No apparent anesthesia complications

## 2013-04-20 ENCOUNTER — Encounter (HOSPITAL_COMMUNITY): Payer: Self-pay | Admitting: Vascular Surgery

## 2013-04-20 ENCOUNTER — Ambulatory Visit: Payer: Medicare Other | Admitting: Physician Assistant

## 2013-04-20 LAB — CBC
HEMATOCRIT: 29.2 % — AB (ref 36.0–46.0)
Hemoglobin: 9.6 g/dL — ABNORMAL LOW (ref 12.0–15.0)
MCH: 28.7 pg (ref 26.0–34.0)
MCHC: 32.9 g/dL (ref 30.0–36.0)
MCV: 87.4 fL (ref 78.0–100.0)
Platelets: 144 10*3/uL — ABNORMAL LOW (ref 150–400)
RBC: 3.34 MIL/uL — AB (ref 3.87–5.11)
RDW: 19.3 % — ABNORMAL HIGH (ref 11.5–15.5)
WBC: 9.1 10*3/uL (ref 4.0–10.5)

## 2013-04-20 LAB — GLUCOSE, CAPILLARY
GLUCOSE-CAPILLARY: 168 mg/dL — AB (ref 70–99)
Glucose-Capillary: 145 mg/dL — ABNORMAL HIGH (ref 70–99)
Glucose-Capillary: 138 mg/dL — ABNORMAL HIGH (ref 70–99)
Glucose-Capillary: 140 mg/dL — ABNORMAL HIGH (ref 70–99)

## 2013-04-20 LAB — BASIC METABOLIC PANEL
BUN: 41 mg/dL — AB (ref 6–23)
CHLORIDE: 104 meq/L (ref 96–112)
CO2: 24 mEq/L (ref 19–32)
Calcium: 8.2 mg/dL — ABNORMAL LOW (ref 8.4–10.5)
Creatinine, Ser: 1.79 mg/dL — ABNORMAL HIGH (ref 0.50–1.10)
GFR calc Af Amer: 28 mL/min — ABNORMAL LOW (ref >90)
GFR calc non Af Amer: 24 mL/min — ABNORMAL LOW (ref >90)
GLUCOSE: 141 mg/dL — AB (ref 70–99)
POTASSIUM: 5.3 meq/L (ref 3.7–5.3)
Sodium: 139 mEq/L (ref 137–147)

## 2013-04-20 MED ORDER — ENOXAPARIN SODIUM 30 MG/0.3ML ~~LOC~~ SOLN
30.0000 mg | SUBCUTANEOUS | Status: DC
  Administered 2013-04-20 – 2013-04-22 (×3): 30 mg via SUBCUTANEOUS
  Filled 2013-04-20 (×5): qty 0.3

## 2013-04-20 NOTE — Progress Notes (Signed)
Clinical Social Work Department BRIEF PSYCHOSOCIAL ASSESSMENT 04/20/2013  Patient:  Suzanne [name], Suzanne [name]     Account Number:  [id number]     Admit date:  04/19/2013  Clinical Social Worker:  Megan Salon  Date/Time:  04/20/2013 04:02 PM  Referred by:  CSW  Date Referred:  04/20/2013 Referred for  SNF Placement   Other Referral:   Interview type:  Patient Other interview type:    PSYCHOSOCIAL DATA Living Status:  ALONE Admitted from facility:   Level of care:   Primary support name:  Tomie China Primary support relationship to patient:  CHILD, ADULT Degree of support available:   Good    CURRENT CONCERNS Current Concerns  Post-Acute Placement   Other Concerns:    SOCIAL WORK ASSESSMENT / PLAN Clinical Social Worker received referral for SNF placement at d/c. CSW introduced self and explained reason for visit. CSW explained SNF process to patient.  Patient reported she is agreeable for SNF placement. Patient states she is familiar with some facilities but will speak with her daughter today about choices. CSW will complete FL2 for MD's signature and will update patient and family when bed offers are received.   Assessment/plan status:  Psychosocial Support/Ongoing Assessment of Needs Other assessment/ plan:   Information/referral to community resources:   SNF information    PATIENT'S/FAMILY'S RESPONSE TO PLAN OF CARE: Patient is in agreement to SNF placement and would like [facility] or [facility].       Jeanette Caprice, MSW, [place]

## 2013-04-20 NOTE — Progress Notes (Signed)
   VASCULAR PROGRESS NOTE  SUBJECTIVE: Comfortable. No complaints.   PHYSICAL EXAM: Filed Vitals:   04/19/13 2000 04/19/13 2109 04/20/13 0000 04/20/13 0330  BP: 101/36  96/48 115/43  Pulse: 67 71 73 73  Temp: 97.3 F (36.3 C)  98.3 F (36.8 C) 97.6 F (36.4 C)  TempSrc: Oral  Oral Oral  Resp: 13 18 14 14   Height:      Weight:      SpO2: 100% 99% 97% 96%   Brisk doppler signals left foot. Incisions look fine. JP 1 = 12 cc; JP 2 = 17 cc (8 hours)  LABS: Lab Results  Component Value Date   WBC 9.1 04/20/2013   HGB 9.6* 04/20/2013   HCT 29.2* 04/20/2013   MCV 87.4 04/20/2013   PLT 144* 04/20/2013   Lab Results  Component Value Date   CREATININE 1.79* 04/20/2013   Lab Results  Component Value Date   INR 1.05 04/18/2013   PROTIME 29.3* 08/01/2011   CBG (last 3)   Recent Labs  04/19/13 1031 04/19/13 1314 04/19/13 2128  GLUCAP 154* 146* 135*   Active Problems:   PAD (peripheral artery disease)   Atherosclerotic PVD with ulceration  ASSESSMENT AND PLAN:  * 1 Day Post-Op s/p: Left CFA endarterectomy and left fem BK pop bypass with vein.   *  D/C JP's and ace  * transfer to 2000  * PTx (will need to decide on home with HHPT vs brief stay in SNF) Ok to be full weight bearing on left LE.  * Anticipated LOS depends upon PT's recommendations.   Gae Gallop Beeper: [phone] 04/20/2013  \

## 2013-04-20 NOTE — Clinical Documentation Improvement (Signed)
04/20/13  Dear Dr. Scot Dock Rolley Sims  Possible Clinical Conditions?    Expected Acute Blood Loss Anemia  Acute Blood Loss Anemia  Acute on chronic blood loss anemia  Precipitous drop in Hematocrit  Other Condition  Cannot Clinically Determine   Risk Factors:  Patient with a history of anemia, noted per 02/25 H&P. EBL per 3/03 Anesthesia record: 500 ml.  Diagnostics: H&H on 3/03:  7.8/23.0 H&H on 3/03:  10.8/32.7  Treatments: Transfusion: PRBC: 670 ml per 3/03 Anesthesia record.  IV fluids / plasma expanders: Albumin 5%: 250 ml per 3/03 Anesthesia record. LR: 1900 ml   Thank You, Theron Arista, Clinical Documentation Specialist:  [phone]  [facility]

## 2013-04-20 NOTE — Progress Notes (Signed)
Report called, pt transferring to 2W29 via bed with belongings.

## 2013-04-20 NOTE — Clinical Documentation Improvement (Signed)
Possible Clinical Conditions?   _______CKD Stage III - GFR 30-59 _______CKD Stage IV - GFR 15-29 _______CKD Stage V - GFR < 15 _______ESRD (End Stage Renal Disease) _______Other condition _______Cannot Clinically determine    Risk Factors:  Patient with a history of renal disease noted per 3/03 Anesthesia pre-op note.  Diagnostics:  3/02:  Bun: 44                Creat: 2.01           GFR: 21 3/04:  Bun: 41                Creat: 1.79           GFR: 24   Thank You, Darrel Hoover, Clinical Documentation Specialist:  [facility]

## 2013-04-20 NOTE — Evaluation (Signed)
Physical Therapy Evaluation Patient Details Name: Suzanne [name]   MRN: [id number] DOB: [birth year] Today's Date: 04/20/2013 Time: 3244-0102 PT Time Calculation (min): 35 min  PT Assessment / Plan / Recommendation History of Present Illness  Suzanne [name] is a 80 y.o. female presenting with a week of worsening fever, chills, and dysuria. PMH is significant for CHF, COPD, DM, RAS, CKD, CAD, DVT, dementia. Pt with SIRS.  Clinical Impression  Pt required min assist for safe transfers. Pt's ambulation was limited to fatigue and SOB. It was discussed and pt agreed that she is not safe to return home alone and that she should be d/c to a short term SNF untilshe recovers from sx and becomes independent.    PT Assessment  Patient needs continued PT services    Follow Up Recommendations  SNF    Does the patient have the potential to tolerate intense rehabilitation      Barriers to Discharge Inaccessible home environment;Decreased caregiver support      Equipment Recommendations  None recommended by PT (refere to SNF at d/c)    Recommendations for Other Services  (SNF)   Frequency Min 3X/week    Precautions / Restrictions Precautions Precautions: Fall Restrictions Weight Bearing Restrictions: No   Pertinent Vitals/Pain L LE thigh pn due to incision and stretching of skin. L foot very sensitive and painful to touch.      Mobility  Bed Mobility General bed mobility comments: not assessed. Pt was seen seated in chair and returned to chair. Transfers Overall transfer level: Needs assistance Equipment used: Rolling walker (2 wheeled) Transfers: Sit to/from Stand Sit to Stand: Min assist General transfer comment: required some verbal cues to properly use armrest of chair to push to stand. Ambulation/Gait Ambulation/Gait assistance: Min assist Ambulation Distance (Feet): 25 Feet Assistive device: Rolling walker (2 wheeled) Gait Pattern/deviations: Decreased stride  length;Shuffle;Step-to pattern Gait velocity: slow, cautious    Exercises General Exercises - Lower Extremity Ankle Circles/Pumps: Strengthening;AROM;Both;5 reps;Supine;Seated Quad Sets: Strengthening;AROM;Both;5 reps;Supine Heel Slides: AROM;Strengthening;5 reps;Left;Supine;Seated   PT Diagnosis: Difficulty walking;Generalized weakness;Acute pain  PT Problem List: Decreased strength;Decreased range of motion;Decreased activity tolerance;Decreased balance;Decreased mobility;Decreased knowledge of use of DME;Impaired sensation;Pain;Decreased skin integrity PT Treatment Interventions: DME instruction;Gait training;Stair training;Functional mobility training;Therapeutic activities;Therapeutic exercise;Balance training;Patient/family education     PT Goals(Current goals can be found in the care plan section) Acute Rehab PT Goals Patient Stated Goal: to be d/c to a SNF until she recovers from the sx and become more Independent PT Goal Formulation: With patient Time For Goal Achievement: 04/27/13 Potential to Achieve Goals: Good  Visit Information  Last PT Received On: 04/20/13 Assistance Needed: +1 (may need assistance with lines) History of Present Illness: Suzanne [name] is a 80 y.o. female presenting with a week of worsening fever, chills, and dysuria. PMH is significant for CHF, COPD, DM, RAS, CKD, CAD, DVT, dementia. Pt with SIRS.       Prior [place] expects to be discharged to:: Skilled nursing facility Living Arrangements: Alone Additional Comments: Pt lives alone with family nearby. Options of HHPT and SNF were discussed  with her and it was agreed that she is expected to be d/c to a SNF until she recovers from surgery and become more independent Prior Function Level of Independence: Independent Communication Communication: No difficulties    Cognition  Cognition Arousal/Alertness: Awake/alert Behavior During Therapy: WFL for tasks  assessed/performed Overall Cognitive Status: Within Functional Limits for tasks assessed    Extremity/Trunk Assessment  Upper Extremity Assessment Upper Extremity Assessment: Overall WFL for tasks assessed Lower Extremity Assessment Lower Extremity Assessment: Overall WFL for tasks assessed   Balance Balance Overall balance assessment: Needs assistance Sitting-balance support: No upper extremity supported Sitting balance-Leahy Scale: Fair Standing balance support: Bilateral upper extremity supported Standing balance-Leahy Scale: Poor General Comments General comments (skin integrity, edema, etc.): L LE had incision wounds on thigh, wrapped lower leg, foot was discolored with wounds. L foot was very sensitive to touch  End of Session PT - End of Session Equipment Utilized During Treatment: Gait belt;Oxygen (2 at 2L, RW) Activity Tolerance: Patient limited by fatigue (Limited by SOB) Patient left: in chair;with call bell/phone within reach Nurse Communication: Mobility status  GP     Kohler,Andrew SPT 04/20/2013, 9:55 AM  Agree with above assessment.  Kittie Plater, PT, DPT Pager #: [phone] Office #: [phone]

## 2013-04-20 NOTE — Evaluation (Signed)
Occupational Therapy Evaluation Patient Details Name: Suzanne [name] MRN: [id number] DOB: 12-09-1921 Today's Date: 04/20/2013 Time: 4193-7902 OT Time Calculation (min): 28 min  OT Assessment / Plan / Recommendation History of present illness Admitted with non healing wounds of L foot, s/p fem pop bypass on 04/18/13.   Clinical Impression   Pt presents with L LE pain, nausea, impaired balance and dependence in bed mobility, transfers and LB ADL.  Plan is for pt to go to SNF for rehab.  Will defer OT to SNF.    OT Assessment  All further OT needs can be met in the next venue of care    Follow Up Recommendations  SNF    Barriers to Discharge      Equipment Recommendations   (defer to SNF)    Recommendations for Other Services    Frequency       Precautions / Restrictions Precautions Precautions: Fall Restrictions Weight Bearing Restrictions: No   Pertinent Vitals/Pain L LE pain, did not rate, premedicated, repositioned and elevated    ADL  Eating/Feeding: Independent Where Assessed - Eating/Feeding: Bed level Grooming: Wash/dry hands;Wash/dry face;Supervision/safety Where Assessed - Grooming: Unsupported sitting Upper Body Bathing: Supervision/safety Where Assessed - Upper Body Bathing: Unsupported sitting Lower Body Bathing: +1 Total assistance Where Assessed - Lower Body Bathing: Unsupported sitting;Supported sit to stand Upper Body Dressing: Supervision/safety Where Assessed - Upper Body Dressing: Unsupported sitting Lower Body Dressing: +1 Total assistance Where Assessed - Lower Body Dressing: Unsupported sitting;Supported sit to stand Equipment Used: Gait belt Transfers/Ambulation Related to ADLs: stood and took steps to [facility] with min assist, minimal WB on L LE ADL Comments: Pt limited by nausea.    OT Diagnosis: Generalized weakness;Acute pain  OT Problem List: Decreased strength;Decreased activity tolerance;Impaired balance (sitting and/or standing);Decreased  knowledge of use of DME or AE;Pain OT Treatment Interventions:     OT Goals(Current goals can be found in the care plan section) Acute Rehab OT Goals Patient Stated Goal: to be d/c to a SNF until she recovers from the sx and become more Independent  Visit Information  Last OT Received On: 04/20/13 Assistance Needed: +1 History of Present Illness: Admitted with non healing wounds of L foot, s/p fem pop bypass on 04/18/13.       Prior [place] expects to be discharged to:: Skilled nursing facility Living Arrangements: Alone Additional Comments: Pt lives alone with family nearby. Options of HHPT and SNF were discussed  with her and it was agreed that she is expected to be d/c to a SNF until she recovers from surgery and become more independent Prior Function Level of Independence: Independent with assistive device(s) Comments: pt reports using walker recently Communication Communication: No difficulties Dominant Hand: Right         Vision/Perception Vision - History Baseline Vision: Wears glasses all the time   Cognition  Cognition Arousal/Alertness: Awake/alert Behavior During Therapy: WFL for tasks assessed/performed Overall Cognitive Status: Within Functional Limits for tasks assessed    Extremity/Trunk Assessment Upper Extremity Assessment Upper Extremity Assessment: Overall WFL for tasks assessed Lower Extremity Assessment Lower Extremity Assessment: Defer to PT evaluation Cervical / Trunk Assessment Cervical / Trunk Assessment: Normal     Mobility Bed Mobility Overal bed mobility: Needs Assistance Bed Mobility: Supine to Sit;Sit to Supine Supine to sit: Mod assist Sit to supine: Mod assist General bed mobility comments: with use of pad Transfers Overall transfer level: Needs assistance Sit to Stand: Min assist  Exercise     Balance Balance Overall balance assessment: Needs assistance Sitting balance-Leahy Scale:  Fair Standing balance support: Bilateral upper extremity supported Standing balance-Leahy Scale: Poor   End of Session OT - End of Session Activity Tolerance:  (limited by nausea with movement) Patient left: in bed;with call bell/phone within reach  GO     Malka So 04/20/2013, 1:18 PM [phone]

## 2013-04-20 NOTE — Progress Notes (Signed)
INITIAL NUTRITION ASSESSMENT  DOCUMENTATION CODES Per approved criteria  -Not Applicable   INTERVENTION: No nutrition intervention at this time --- patient declined RD to follow for nutrition care plan  NUTRITION DIAGNOSIS: Increased nutrient needs related to post-op healing as evidenced by estimated nutrition needs  Goal: Pt to meet >/= 90% of their estimated nutrition needs   Monitor:  PO & supplemental intake, weight, labs, I/O's  Reason for Assessment: Malnutrition Screening Tool Report  80 y.o. female  Admitting Dx: nonhealing wound left foot with multilevel arterial occlusive disease  ASSESSMENT: 80 y.o. female who presented with nonhealing wounds of her left foot. She had undergone previous aortobifemoral bypass graft for aneurysmal disease by Dr. Amedeo Plenty. She underwent an arteriogram by Dr. Quay Burow which showed a tight stenosis in the common femoral artery with a large calcific plaque present.   She had severe diffuse disease of her distal superficial femoral artery and above-knee popliteal artery. It was felt that revascularization was her best option for limb salvage and she presented for surgery.  Patient s/p procedures 3/3: ENDARTERECTOMY OF LEFT COMMON FEMORAL ARTERY WITH BOVINE PERICARDIAL PATCH ANGIOPLASTY OF DISTAL LEFT LIMB OF AORTOFEMORAL BYBASS GRAFT  Patient reports her appetite isn't very good.  Did better today than yesterday with her meals.  PO intake 5-15% per flowsheet records.  States her weight has been stable.  RD offered nutrition supplements, however, patient declined.  Nutrition focused physical exam completed.  No muscle or subcutaneous fat depletion noticed.  Height: Ht Readings from Last 1 Encounters:  04/19/13 5\' 2"  (1.575 m)    Weight: Wt Readings from Last 1 Encounters:  04/19/13 147 lb 7.8 oz (66.9 kg)    Ideal Body Weight: 110 lb  % Ideal Body Weight: 133%  Wt Readings from Last 10 Encounters:  04/19/13 147 lb 7.8 oz  (66.9 kg)  04/19/13 147 lb 7.8 oz (66.9 kg)  04/18/13 139 lb (63.05 kg)  04/13/13 142 lb (64.411 kg)  04/12/13 142 lb 8 oz (64.638 kg)  04/04/13 148 lb 13 oz (67.5 kg)  04/04/13 148 lb 13 oz (67.5 kg)  03/24/13 141 lb 8 oz (64.184 kg)  02/05/13 139 lb (63.05 kg)  01/27/13 147 lb 14.4 oz (67.087 kg)    Usual Body Weight: 148 lb  % Usual Body Weight: 99%  BMI:  Body mass index is 26.97 kg/(m^2).  Estimated Nutritional Needs: Kcal: 1500-1700 Protein: 75-85 gm Fluid: 1.5-1.7 L  Skin: unstageable heel wound  Diet Order: Carb Control  EDUCATION NEEDS: -No education needs identified at this time   Intake/Output Summary (Last 24 hours) at 04/20/13 1548 Last data filed at 04/20/13 1456  Gross per 24 hour  Intake   1740 ml  Output  457.5 ml  Net 1282.5 ml    Labs:   Recent Labs Lab 04/18/13 1454 04/19/13 1118 04/19/13 1515 04/20/13 0058  NA 139 141  --  139  K 4.2 4.0  --  5.3  CL 97  --   --  104  CO2 27  --   --  24  BUN 44*  --   --  41*  CREATININE 2.01*  --  1.85* 1.79*  CALCIUM 10.3  --   --  8.2*  GLUCOSE 150* 163*  --  141*    CBG (last 3)   Recent Labs  04/19/13 2128 04/20/13 0747 04/20/13 1211  GLUCAP 135* 145* 168*    Scheduled Meds: . aspirin EC  325 mg Oral Daily  .  atorvastatin  40 mg Oral q1800  . budesonide-formoterol  2 puff Inhalation BID  . docusate sodium  100 mg Oral Daily  . enoxaparin (LOVENOX) injection  30 mg Subcutaneous Q24H  . famotidine  20 mg Oral Daily  . ferrous sulfate  325 mg Oral QHS  . furosemide  40 mg Oral Daily  . insulin glargine  16 Units Subcutaneous QHS  . losartan  100 mg Oral Daily  . metoprolol succinate  25 mg Oral Daily  . multivitamin with minerals  1 tablet Oral QHS  . omega-3 acid ethyl esters  1 g Oral BID  . pantoprazole  80 mg Oral Daily    Continuous Infusions: . sodium chloride 100 mL/hr at 04/20/13 1105  . DOPamine      Past Medical History  Diagnosis Date  . Type II or  unspecified type diabetes mellitus without mention of complication, not stated as uncontrolled   . PVD (peripheral vascular disease)     a. PTA & stent right superficial artery PTA & left common iliac artery stenting. Right fem-pop bypass graft. b. Per Dr. Kennon Holter note: h/o aortobifemoral bypass grafting remotely as well as fem-peroneal bypass grafting.  . Mixed hyperlipidemia   . Compression fx, thoracic spine     T12  . Ischium fracture   . Memory loss, short term   . Pulmonary nodule   . CAD (coronary artery disease) 10/12/2008    a. 30% LAD,30% CX & 40% tandem mid stenosis in the AV groove, 50-60% RCA  . Chronic diastolic CHF (congestive heart failure) 09-2008    a. EF 40% by cath 2010. b. Improved - last echo 2015 with normal EF.  [name] Anemia   . Detached retina 02-2005    R EYE  . Fracture 02-2007    L5 AND PELVIS  . Left ventricular dysfunction     a. EF 40% by cath 2010. b. Improved - last echo 2015 with normal EF.   [name] AAA (abdominal aortic aneurysm)     Repair unknown date  . Critical lower limb ischemia   . Chronic respiratory failure     a. On home O2.  [name] Hypertension   . Warfarin anticoagulation     DC summary from 2010 reports she is "on chronic  Coumadin for vascular disease." This is listed on her med list going back to 2006 by records available. Teaching service H&P remotely says she has h/o DVT but I cannot find any evidence of this. Admission H/P 03/2013 suggests history of atrial fib but all ECGs appear sinus and there is no mention of this previously.  [name] COPD (chronic obstructive pulmonary disease)   . Renal artery stenosis     a. 1-59% by doppler in 2014.    Past Surgical History  Procedure Laterality Date  . Aorta surgery      aortic anyuersm surgery.   . Vessel surgery      S/P aortobifemoral bypass grafting and right fem-peroneal bypass grafting remotely  . Cholecystectomy    . Cataract surgery  06-2006    R EYE  . Vascular surgery    . Cardiac  catheterization    . Eye surgery      detached retina  (r)  . Eye surgery      cataract  OD  . Fracture surgery      sternum  2008    Arthur Holms, RD, LDN Pager #: [phone] After-Hours Pager #: [phone]

## 2013-04-20 NOTE — Evaluation (Signed)
Physical Therapy Evaluation Patient Details Name: Suzanne [name]   MRN: [id number] DOB: Nov 05, 1921 Today's Date: 04/20/2013 Time: 4010-2725 PT Time Calculation (min): 35 min  PT Assessment / Plan / Recommendation History of Present Illness  Suzanne [name] is a 80 y.o. female presenting with a week of worsening fever, chills, and dysuria. PMH is significant for CHF, COPD, DM, RAS, CKD, CAD, DVT, dementia. Pt with SIRS.  Clinical Impression  Pt required min assist for safe transfers. Pt's ambulation was limited to fatigue and SOB. It was discussed and pt agreed that she is not safe to return home alone and that she should be d/c to a short term SNF untilshe recovers from sx and becomes independent.    PT Assessment  Patient needs continued PT services    Follow Up Recommendations  SNF    Does the patient have the potential to tolerate intense rehabilitation      Barriers to Discharge Inaccessible home environment;Decreased caregiver support      Equipment Recommendations  None recommended by PT (refere to SNF at d/c)    Recommendations for Other Services  (SNF)   Frequency Min 3X/week    Precautions / Restrictions Precautions Precautions: Fall Restrictions Weight Bearing Restrictions: No   Pertinent Vitals/Pain L LE thigh pn due to incision and stretching of skin. L foot very sensitive and painful to touch.      Mobility  Bed Mobility General bed mobility comments: not assessed. Pt was seen seated in chair and returned to chair. Transfers Overall transfer level: Needs assistance Equipment used: Rolling walker (2 wheeled) Transfers: Sit to/from Stand Sit to Stand: Min assist General transfer comment: required some verbal cues to properly use armrest of chair to push to stand. Ambulation/Gait Ambulation/Gait assistance: Min assist Ambulation Distance (Feet): 25 Feet Assistive device: Rolling walker (2 wheeled) Gait Pattern/deviations: Decreased stride  length;Shuffle;Step-to pattern Gait velocity: slow, cautious    Exercises General Exercises - Lower Extremity Ankle Circles/Pumps: Strengthening;AROM;Both;5 reps;Supine;Seated Quad Sets: Strengthening;AROM;Both;5 reps;Supine Heel Slides: AROM;Strengthening;5 reps;Left;Supine;Seated   PT Diagnosis: Difficulty walking;Generalized weakness;Acute pain  PT Problem List: Decreased strength;Decreased range of motion;Decreased activity tolerance;Decreased balance;Decreased mobility;Decreased knowledge of use of DME;Impaired sensation;Pain;Decreased skin integrity PT Treatment Interventions: DME instruction;Gait training;Stair training;Functional mobility training;Therapeutic activities;Therapeutic exercise;Balance training;Patient/family education     PT Goals(Current goals can be found in the care plan section) Acute Rehab PT Goals Patient Stated Goal: to be d/c to a SNF until she recovers from the sx and become more Independent PT Goal Formulation: With patient Time For Goal Achievement: 04/27/13 Potential to Achieve Goals: Good  Visit Information  Last PT Received On: 04/20/13 Assistance Needed: +1 (may need assistance with lines) History of Present Illness: Suzanne [name] is a 80 y.o. female presenting with a week of worsening fever, chills, and dysuria. PMH is significant for CHF, COPD, DM, RAS, CKD, CAD, DVT, dementia. Pt with SIRS.       Prior [place] expects to be discharged to:: Skilled nursing facility Living Arrangements: Alone Additional Comments: Pt lives alone with family nearby. Options of HHPT and SNF were discussed  with her and it was agreed that she is expected to be d/c to a SNF until she recovers from surgery and become more independent Prior Function Level of Independence: Independent Communication Communication: No difficulties    Cognition  Cognition Arousal/Alertness: Awake/alert Behavior During Therapy: WFL for tasks  assessed/performed Overall Cognitive Status: Within Functional Limits for tasks assessed    Extremity/Trunk Assessment  Upper Extremity Assessment Upper Extremity Assessment: Overall WFL for tasks assessed Lower Extremity Assessment Lower Extremity Assessment: Overall WFL for tasks assessed   Balance Balance Overall balance assessment: Needs assistance Sitting-balance support: No upper extremity supported Sitting balance-Leahy Scale: Fair Standing balance support: Bilateral upper extremity supported Standing balance-Leahy Scale: Poor General Comments General comments (skin integrity, edema, etc.): L LE had incision wounds on thigh, wrapped lower leg, foot was discolored with wounds. L foot was very sensitive to touch  End of Session PT - End of Session Equipment Utilized During Treatment: Gait belt;Oxygen (2 at 2L, RW) Activity Tolerance: Patient limited by fatigue (Limited by SOB) Patient left: in chair;with call bell/phone within reach Nurse Communication: Mobility status  GP     Kris No SPT 04/20/2013, 9:55 AM

## 2013-04-20 NOTE — Progress Notes (Signed)
Utilization review completed.  

## 2013-04-20 NOTE — Progress Notes (Addendum)
Clinical Social Work Department CLINICAL SOCIAL WORK PLACEMENT NOTE 04/20/2013  Patient:  Suzanne [name], Suzanne [name]  Account Number:  [id number] Admit date:  04/19/2013  Clinical Social Worker:  Megan Salon  Date/time:  04/20/2013 04:07 PM  Clinical Social Work is seeking post-discharge placement for this patient at the following level of care:   [place]   (*CSW will update this form in Epic as items are completed)   04/20/2013  Patient/family provided with [facility] Department of Clinical Social Work's list of facilities offering this level of care within the geographic area requested by the patient (or if unable, by the patient's family).  04/20/2013  Patient/family informed of their freedom to choose among providers that offer the needed level of care, that participate in Medicare, Medicaid or managed care program needed by the patient, have an available bed and are willing to accept the patient.  04/20/2013  Patient/family informed of [facility]' ownership interest in [facility], as well as of the fact that they are under no obligation to receive care at this facility.  PASARR submitted to EDS on 04/20/2013 PASARR number received from [employer]DS on 04/20/2013  FL2 transmitted to all facilities in geographic area requested by pt/family on  04/20/2013 FL2 transmitted to all facilities within larger geographic area on   Patient informed that his/her managed care company has contracts with or will negotiate with  certain facilities, including the following:     Patient/family informed of bed offers received:   Patient chooses bed at  Physician recommends and patient chooses bed at    Patient to be transferred to [facility] on 04/23/13- [facility], [place]   Patient to be transferred to facility by [place], [place]   The following physician request were entered in Epic:   Additional Comments:  Jeanette Caprice, MSW, [place]

## 2013-04-21 DIAGNOSIS — Z48812 Encounter for surgical aftercare following surgery on the circulatory system: Secondary | ICD-10-CM

## 2013-04-21 LAB — GLUCOSE, CAPILLARY
GLUCOSE-CAPILLARY: 132 mg/dL — AB (ref 70–99)
GLUCOSE-CAPILLARY: 236 mg/dL — AB (ref 70–99)
Glucose-Capillary: 144 mg/dL — ABNORMAL HIGH (ref 70–99)
Glucose-Capillary: 153 mg/dL — ABNORMAL HIGH (ref 70–99)

## 2013-04-21 NOTE — Progress Notes (Signed)
CSW spoke to patient's daughters by RN's request and provided bed offers. Patient gave permission to speak with daughter. Daughter states she will look at her options and call social worker back today.  Jeanette Caprice, MSW, [place]

## 2013-04-21 NOTE — Progress Notes (Signed)
VASCULAR LAB PRELIMINARY  ARTERIAL  ABI completed:    RIGHT    LEFT    PRESSURE WAVEFORM  PRESSURE WAVEFORM  BRACHIAL 108 Tri BRACHIAL 103 Tri  DP   DP    AT 67 [place]  AT 238 [place]   PT 43 Mono  PT 129  [place]   PER   PER    GREAT TOE  NA GREAT TOE  NA    RIGHT LEFT  ABI 0.62 -may be falsely elevated due to calcified vessels. Non compressible due to calcified vessels.     Landry Mellow, RDMS, RVT  04/21/2013, 12:58 PM

## 2013-04-21 NOTE — Progress Notes (Signed)
   VASCULAR PROGRESS NOTE  SUBJECTIVE: Comfortable  PHYSICAL EXAM: Filed Vitals:   04/20/13 1359 04/20/13 2008 04/20/13 2054 04/21/13 0548  BP: 123/57 102/43  126/50  Pulse: 66 70  73  Temp: 98.1 F (36.7 C) 97.6 F (36.4 C)  97.8 F (36.6 C)  TempSrc: Oral Oral  Oral  Resp: 18 18  18   Height:      Weight:      SpO2: 96% 100% 100% 99%   Palpable graft pulse (popliteal fossa) Left foot warm Incisions look good. Dressing in left groin is dry.   LABS: Lab Results  Component Value Date   WBC 9.1 04/20/2013   HGB 9.6* 04/20/2013   HCT 29.2* 04/20/2013   MCV 87.4 04/20/2013   PLT 144* 04/20/2013   Lab Results  Component Value Date   CREATININE 1.79* 04/20/2013   Lab Results  Component Value Date   INR 1.05 04/18/2013   PROTIME 29.3* 08/01/2011   CBG (last 3)   Recent Labs  04/20/13 1641 04/20/13 2131 04/21/13 0602  GLUCAP 138* 140* 144*    Active Problems:   PAD (peripheral artery disease)   Atherosclerotic PVD with ulceration   ASSESSMENT AND PLAN:  * 2 Day Post-Op s/p: Left CFA endarterectomy and left fem BK pop bypass with vein.   * Appreciate PTx's help    *  CKD Stage IV (GFR on 04/18/13 was 21) creatinine improved post op (crt down to 1.79)  * She wants to go to SNF before going home.   Gae Gallop Beeper: [phone] 04/21/2013

## 2013-04-21 NOTE — Progress Notes (Signed)
CSW spoke to patient's daughter over the phone and daughter would like to go with [facility]. CSW confirmed with Helene Kelp that patient is able to come tomorrow.   Jeanette Caprice, MSW, [place]

## 2013-04-21 NOTE — Progress Notes (Signed)
Physical Therapy Treatment Patient Details Name: Suzanne [name] MRN: [id number] DOB: [birth year] Today's Date: 04/21/2013 Time: 8341-9622 PT Time Calculation (min): 24 min  PT Assessment / Plan / Recommendation  History of Present Illness Admitted with non healing wounds of L foot, s/p fem pop bypass on 04/18/13.   PT Comments   Pt slowly progressing with mobility. Continues to be limited by pain and fatigue. Pt very deconditioned. Plans to D/C to SNF for post acute rehab tomorrow.   Follow Up Recommendations  SNF     Does the patient have the potential to tolerate intense rehabilitation     Barriers to Discharge        Equipment Recommendations  Rolling walker with 5" wheels    Recommendations for Other Services    Frequency Min 2X/week   Progress towards PT Goals Progress towards PT goals: Progressing toward goals  Plan Current plan remains appropriate;Frequency needs to be updated    Precautions / Restrictions Precautions Precautions: Fall Restrictions Weight Bearing Restrictions: No   Pertinent Vitals/Pain 5/10; patient repositioned for comfort in bed with bil LEs elevated    Mobility  Bed Mobility Overal bed mobility: Needs Assistance Bed Mobility: Supine to Sit;Sit to Supine Supine to sit: Min assist;HOB elevated Sit to supine: Min assist General bed mobility comments: requires (A) to elevate shoulders to sititng position; cues for sequencing; requires incr time due to pain in LEs; (A) to bring LEs back onto bed; pt fatigues quickly  Transfers Overall transfer level: Needs assistance Equipment used: Rolling walker (2 wheeled) Transfers: Sit to/from Stand Sit to Stand: Min assist General transfer comment: cues for hand placement and sequencing; pt demo decreased safety awareness with sitting- cues to control descent and reach back for chair  Ambulation/Gait Ambulation/Gait assistance: Min guard Ambulation Distance (Feet): 100 Feet Assistive device: Rolling walker  (2 wheeled) Gait Pattern/deviations: Decreased stride length;Narrow base of support;Trunk flexed Gait velocity: decreased  Gait velocity interpretation: Below normal speed for age/gender General Gait Details: pt required a satnding rest break due to fatigue; cues for sequencing and safe management of RW; pt with fwd flexed trunk and head down throughout ambulation, min guard to steady     Exercises General Exercises - Lower Extremity Ankle Circles/Pumps: AROM;Both;10 reps;Seated Long Arc Quad: AROM;Both;10 reps;Seated Hip Flexion/Marching: AROM;Both;Strengthening;10 reps;Seated;Other (comment) (pt fatigues quickly)   PT Diagnosis:    PT Problem List:   PT Treatment Interventions:     PT Goals (current goals can now be found in the care plan section) Acute Rehab PT Goals Patient Stated Goal: to be d/c to a SNF until she recovers from the sx and become more Independent PT Goal Formulation: With patient Time For Goal Achievement: 04/27/13 Potential to Achieve Goals: Good  Visit Information  Last PT Received On: 04/21/13 Assistance Needed: +1 History of Present Illness: Admitted with non healing wounds of L foot, s/p fem pop bypass on 04/18/13.    Subjective Data  Subjective: Pt lying supine; reluctant initially but then agreeable to therapy with max encouragement  Patient Stated Goal: to be d/c to a SNF until she recovers from the sx and become more Independent   Cognition  Cognition Arousal/Alertness: Awake/alert Behavior During Therapy: WFL for tasks assessed/performed Overall Cognitive Status: Within Functional Limits for tasks assessed    Balance  Balance Overall balance assessment: Needs assistance Sitting-balance support: Single extremity supported;Feet supported Sitting balance-Leahy Scale: Good Standing balance support: Bilateral upper extremity supported;During functional activity Standing balance-Leahy Scale: Poor Standing balance comment:  requires RW for safe  standing  General Comments General comments (skin integrity, edema, etc.): Incision wounds on Lt LE; Lt foot with dark colored first digit   End of Session PT - End of Session Equipment Utilized During Treatment: Gait belt;Oxygen Activity Tolerance: Patient tolerated treatment well Patient left: in bed;with call bell/phone within reach Nurse Communication: Mobility status   GP     Gustavus Bryant, [place] 04/21/2013, 4:58 PM

## 2013-04-22 LAB — TYPE AND SCREEN
ABO/RH(D): B POS
Antibody Screen: NEGATIVE
UNIT DIVISION: 0
UNIT DIVISION: 0
Unit division: 0
Unit division: 0

## 2013-04-22 LAB — GLUCOSE, CAPILLARY
GLUCOSE-CAPILLARY: 171 mg/dL — AB (ref 70–99)
Glucose-Capillary: 114 mg/dL — ABNORMAL HIGH (ref 70–99)
Glucose-Capillary: 126 mg/dL — ABNORMAL HIGH (ref 70–99)

## 2013-04-22 MED ORDER — LOPERAMIDE HCL 2 MG PO CAPS: 2.0000 mg | ORAL_CAPSULE | ORAL | Status: DC | PRN

## 2013-04-22 NOTE — Progress Notes (Addendum)
[facility] [place]  Subjective  - Patient reports 2 bouts of diarrhea this am.    Objective 120/51 113 97.6 F (36.4 C) (Oral) 18 98%  Intake/Output Summary (Last 24 hours) at 04/22/13 0841 Last data filed at 04/22/13 0440  Gross per 24 hour  Intake    480 ml  Output   1301 ml  Net   -821 ml   Left Anterior tib and DP doppler signals Palpable graft pulse in popliteal fossa Incisions clean and dry no hematoma  Assessment/Planning: POD #3  s/p: Left CFA endarterectomy and left fem BK pop bypass with vein.   Will send stool to r/o C diff D/C'd stool softer and laxatives If C diff negative will start imodium This may change her D/C date from today until tomorrow  Laurence Slate [facility] 04/22/2013 8:41 AM--  Agree with above.  Dressing removed from left groin. Incisions look fine.  Palpable graft pulse. C. Diff ordered Will start flagyl pending results.  To SNF tomorrow if diarrhea improved.  Suzanne Mayo, MD, [place] [phone] 04/22/2013

## 2013-04-22 NOTE — Progress Notes (Addendum)
3:00PM: CSW sent over dc summary to [facility] for admission possibly over the weekend. Weekend Education officer, museum to assist with dc.  Update: Per [facility], they are able to take patient as long as dc summary is up by 3:00 pm. CSW notified RN who will page MD. [place] notified patient and patient's daughter by bedside.  Per RN, patient is not ready for dc today. CSW left voicemail for [facility] to see if they are able to do admissions over the weekend.  Jeanette Caprice, MSW, [place]

## 2013-04-22 NOTE — Discharge Summary (Deleted)
[facility] Discharge Summary   Patient ID:  Suzanne [name] MRN: [id number] DOB/AGE: 1921-06-29 80 y.o.  Admit date: 04/19/2013 Discharge date: 04/22/2013 Date of Surgery: 04/19/2013 Surgeon: Surgeon(s): Angelia Mould, MD  Admission Diagnosis: Peripheral vascular disease  Discharge Diagnoses:  Peripheral vascular disease  Secondary Diagnoses: Past Medical History  Diagnosis Date  . Type II or unspecified type diabetes mellitus without mention of complication, not stated as uncontrolled   . PVD (peripheral vascular disease)     a. PTA & stent right superficial artery PTA & left common iliac artery stenting. Right fem-pop bypass graft. b. Per Dr. Kennon Holter note: h/o aortobifemoral bypass grafting remotely as well as fem-peroneal bypass grafting.  . Mixed hyperlipidemia   . Compression fx, thoracic spine     T12  . Ischium fracture   . Memory loss, short term   . Pulmonary nodule   . CAD (coronary artery disease) 10/12/2008    a. 30% LAD,30% CX & 40% tandem mid stenosis in the AV groove, 50-60% RCA  . Chronic diastolic CHF (congestive heart failure) 09-2008    a. EF 40% by cath 2010. b. Improved - last echo 2015 with normal EF.  [name] Anemia   . Detached retina 02-2005    R EYE  . Fracture 02-2007    L5 AND PELVIS  . Left ventricular dysfunction     a. EF 40% by cath 2010. b. Improved - last echo 2015 with normal EF.   [name] AAA (abdominal aortic aneurysm)     Repair unknown date  . Critical lower limb ischemia   . Chronic respiratory failure     a. On home O2.  [name] Hypertension   . Warfarin anticoagulation     DC summary from 2010 reports she is "on chronic  Coumadin for vascular disease." This is listed on her med list going back to 2006 by records available. Teaching service H&P remotely says she has h/o DVT but I cannot find any evidence of this. Admission H/P 03/2013 suggests history of atrial fib but all ECGs appear sinus and there is no mention of this  previously.  [name] COPD (chronic obstructive pulmonary disease)   . Renal artery stenosis     a. 1-59% by doppler in 2014.    Procedure(s): VEIN PATCH ANGIOPLASTY - LEFT COMMON FEMORAL ARTERY BYPASS GRAFT FEMORAL- BELOW KNEE POPLITEAL ARTERY WITH VEIN  Discharged Condition: good  HPI: CC: 80 y/o with left foot ulcers She underwent repair of an infrarenal abdominal aortic aneurysm with an aortobifemoral bypass graft by Dr. Amedeo Plenty in 1998. She's had multiple revascularization attempts in the right leg. She currently has some mild claudication bilaterally but however her activity is fairly limited by her pulmonary status.  She underwent Endarterectomy of left common femoral artery with bovine pericardial patch angioplasty of distal left limb of aortofemoral bypass graft and left femoral to below knee popliteal artery bypass with non-reversed translocated saphenous vein graft.  Post op stable transferred to 2W began daily dial soap soakes and dry dressing changes.  Plan was for her to be discharged to SNF today.  She has developed diarrhea 03/25/2013.  C diff pending will treat appropriately.  JP drains were d/C 03/24/2013.  Compartment soft with dry dressing at superior incision.  At this time she has had no more diarrhea.   WBC count in 9.1 and Temp is 97.6.  She is stable.     Hospital Course:  Suzanne [name] is a 80 y.o.  female is S/P Left Procedure(s): VEIN PATCH ANGIOPLASTY - LEFT COMMON FEMORAL ARTERY BYPASS GRAFT FEMORAL- BELOW KNEE POPLITEAL ARTERY WITH VEIN Extubated: POD # 0 Physical exam: Palpable graft pulse (popliteal fossa)  Left foot warm  Incisions look good.  Dressing in left groin is dry.   Post-op wounds healing well Pt. Ambulating, voiding and taking PO diet without difficulty. Pt pain controlled with PO pain meds. Labs as below Complications:see HPI  Consults:     Significant Diagnostic Studies: CBC Lab Results  Component Value Date   WBC 9.1 04/20/2013   HGB 9.6*  04/20/2013   HCT 29.2* 04/20/2013   MCV 87.4 04/20/2013   PLT 144* 04/20/2013    BMET    Component Value Date/Time   NA 139 04/20/2013 0058   NA 145 08/01/2011   K 5.3 04/20/2013 0058   CL 104 04/20/2013 0058   CO2 24 04/20/2013 0058   GLUCOSE 141* 04/20/2013 0058   BUN 41* 04/20/2013 0058   BUN 37* 08/01/2011   CREATININE 1.79* 04/20/2013 0058   CREATININE 1.59* 04/07/2013 1623   CREATININE 1.5* 08/01/2011   CALCIUM 8.2* 04/20/2013 0058   GFRNONAA 24* 04/20/2013 0058   GFRAA 28* 04/20/2013 0058   COAG Lab Results  Component Value Date   INR 1.05 04/18/2013   INR 1.15 04/04/2013   INR 2.3 03/24/2013   PROTIME 29.3* 08/01/2011     Disposition:  Discharge to :Skilled nursing facility  Future Appointments Provider Department Dept Phone   06/07/2013 11:45 AM Darlyne Russian, MD [facility] [phone]       Medication List    ASK your doctor about these medications       acetaminophen 500 MG tablet  Commonly known as:  TYLENOL  Take 1,000 mg by mouth every 4 (four) hours as needed for moderate pain.     albuterol (2.5 MG/3ML) 0.083% nebulizer solution  Commonly known as:  PROVENTIL  Take 2.5 mg by nebulization 3 (three) times daily as needed for wheezing or shortness of breath.     aspirin 325 MG EC tablet  Take 1 tablet (325 mg total) by mouth daily.     budesonide-formoterol 160-4.5 MCG/ACT inhaler  Commonly known as:  SYMBICORT  Inhale 2 puffs into the lungs 2 (two) times daily.     CALCIUM 600 + D PO  Take 600 mg by mouth daily.     COMBIVENT RESPIMAT 20-100 MCG/ACT Aers respimat  Generic drug:  Ipratropium-Albuterol  Inhale 2 puffs into the lungs every 6 (six) hours as needed for wheezing or shortness of breath.     esomeprazole 40 MG capsule  Commonly known as:  NEXIUM  Take 40 mg by mouth daily before breakfast.     ferrous sulfate 325 (65 FE) MG tablet  Take 325 mg by mouth at bedtime.     Fish Oil 1200 MG Caps  Take 1,200 mg by mouth 2 (two) times daily.      furosemide 40 MG tablet  Commonly known as:  LASIX  Take 40 mg by mouth daily.     HYDROcodone-acetaminophen 7.5-325 MG per tablet  Commonly known as:  NORCO  Take 1 tablet by mouth every 4 (four) hours as needed for moderate pain.     ibandronate 150 MG tablet  Commonly known as:  BONIVA  Take 150 mg by mouth every 30 (thirty) days. Take in the morning with a full glass of water, on an empty stomach, and do not take  anything else by mouth or lie down for the next 30 min. (take on the 25th of each month)     Insulin Glargine 100 UNIT/ML Solostar Pen  Commonly known as:  LANTUS  Inject 16 Units into the skin daily at 10 pm.     lidocaine 5 %  Commonly known as:  LIDODERM  Place 1 patch onto the skin daily. Remove & Discard patch within 12 hours or as directed by MD     losartan 100 MG tablet  Commonly known as:  COZAAR  Take 100 mg by mouth daily.     metoprolol succinate 25 MG 24 hr tablet  Commonly known as:  TOPROL-XL  Take 25 mg by mouth daily.     multivitamin with minerals Tabs tablet  Take 1 tablet by mouth at bedtime.     ranitidine 150 MG tablet  Commonly known as:  ZANTAC  Take 150 mg by mouth 2 (two) times daily.     rosuvastatin 20 MG tablet  Commonly known as:  CRESTOR  Take 20 mg by mouth daily. PATIENT NEEDS OFFICE VISIT/LABS FOR ADDITIONAL REFILLS       Verbal and written Discharge instructions given to the patient. Wound care per Discharge AVS  F/U with Dr. Scot Dock in 4 weeks.   SignedRoxy Horseman 04/22/2013, 8:51 AM

## 2013-04-23 ENCOUNTER — Telehealth: Payer: Self-pay

## 2013-04-23 LAB — GLUCOSE, CAPILLARY
Glucose-Capillary: 86 mg/dL (ref 70–99)
Glucose-Capillary: 121 mg/dL — ABNORMAL HIGH (ref 70–99)

## 2013-04-23 MED ORDER — METRONIDAZOLE 250 MG PO TABS: 250.0000 mg | ORAL_TABLET | Freq: Three times a day (TID) | ORAL | Status: DC

## 2013-04-23 MED ORDER — METRONIDAZOLE 250 MG PO TABS
250.0000 mg | ORAL_TABLET | Freq: Three times a day (TID) | ORAL | Status: DC
  Filled 2013-04-23 (×3): qty 1

## 2013-04-23 MED ORDER — HYDROCODONE-ACETAMINOPHEN 7.5-325 MG PO TABS: 1.0000 | ORAL_TABLET | ORAL | Status: DC | PRN

## 2013-04-23 NOTE — Progress Notes (Addendum)
[facility] Progress Note  04/23/2013 8:38 AM 4 Days Post-Op  Subjective:  "I don't feel as good today as I did yesterday" "My toe was bleeding" States she is eating well.  No more diarrhea.   Afebrile VSS 100% 2LO2NC  Filed Vitals:   04/22/13 2153  BP: 127/53  Pulse: 77  Temp: 98.2 F (36.8 C)  Resp: 18    Physical Exam: Incisions:  All incisions are c/d/i and some ecchymosis around distal incisions Extremities:  Left foot is warm and well perfused with palpable left DP.  Left great toe with some gangrenous changes  CBC    Component Value Date/Time   WBC 9.1 04/20/2013 0058   WBC 11.4* 01/25/2013 1714   RBC 3.34* 04/20/2013 0058   RBC 3.44* 01/25/2013 1714   HGB 9.6* 04/20/2013 0058   HGB 10.4* 01/25/2013 1714   HCT 29.2* 04/20/2013 0058   HCT 34.8* 01/25/2013 1714   PLT 144* 04/20/2013 0058   MCV 87.4 04/20/2013 0058   MCV 101.3* 01/25/2013 1714   MCH 28.7 04/20/2013 0058   MCH 30.2 01/25/2013 1714   MCHC 32.9 04/20/2013 0058   MCHC 29.9* 01/25/2013 1714   RDW 19.3* 04/20/2013 0058   LYMPHSABS 1.9 01/27/2013 0835   MONOABS 0.6 01/27/2013 0835   EOSABS 0.2 01/27/2013 0835   BASOSABS 0.0 01/27/2013 0835    BMET    Component Value Date/Time   NA 139 04/20/2013 0058   NA 145 08/01/2011   K 5.3 04/20/2013 0058   CL 104 04/20/2013 0058   CO2 24 04/20/2013 0058   GLUCOSE 141* 04/20/2013 0058   BUN 41* 04/20/2013 0058   BUN 37* 08/01/2011   CREATININE 1.79* 04/20/2013 0058   CREATININE 1.59* 04/07/2013 1623   CREATININE 1.5* 08/01/2011   CALCIUM 8.2* 04/20/2013 0058   GFRNONAA 24* 04/20/2013 0058   GFRAA 28* 04/20/2013 0058    INR    Component Value Date/Time   INR 1.05 04/18/2013 1454   INR 2.3 03/24/2013 1551     Intake/Output Summary (Last 24 hours) at 04/23/13 0838 Last data filed at 04/23/13 0730  Gross per 24 hour  Intake    540 ml  Output    600 ml  Net    -60 ml     Assessment:  80 y.o. female is s/p:  1. Endarterectomy of left common femoral artery with bovine  pericardial patch angioplasty of distal left limb of aortofemoral bypass graft  2. Left femoral to below knee popliteal artery bypass with non-reversed translocated saphenous vein graft  4 Days Post-Op  Plan: -left foot is warm and well perfused. -C diff results are not back yet.  She has not had any further episodes of diarrhea.  Will send her on Flagyl for presumed C diff. 250 mg po q8hr x 10 days. -continue dressing to left great toe and continue to float heels -continue dressing to left great toe -DVT prophylaxis:  Lovenox -hopefully to SNF today   Leontine Locket, PA-C [facility] [phone] 04/23/2013 8:38 AM    I have examined the patient, reviewed and agree with above.  Armando Bukhari, MD 04/23/2013 10:17 AM

## 2013-04-23 NOTE — Progress Notes (Signed)
Patient is medically stable for D/C to [facility] today. Per Surveyor, minerals at [place] patient is going to room 307 on the [facility]. Clinical Education officer, museum (CSW) prepared D/C packet and arranged non-emergency EMS ([facility]) for transport. CSW contacted patient's daughter and made her aware of above. Nursing is aware of above. Please reconsult if further social work needs arise. CSW signing off.   Blima Rich, [place] Weekend CSW [phone]

## 2013-04-23 NOTE — Telephone Encounter (Signed)
Patient's daughter called to inform Dr. Everlene Farrier that her mother will be at [facility] rehab facility. The number to the facility is [phone]. Patient's daughter states that Dr. Everlene Farrier wanted to know which facility the patient would be moved to.

## 2013-04-23 NOTE — Discharge Summary (Signed)
[facility] Discharge Summary  Suzanne [name] [birth year] 80 y.o. female  [id number]  Admission Date: 04/19/2013  Discharge Date: 04/23/13  Physician: Angelia Mould, MD  Admission Diagnosis: Peripheral vascular disease   HPI:   This is a 80 y.o. female who developed a crack on her left heel back in May of 2014. She has subsequently developed several other small wounds on the left foot. She underwent an arteriogram by Dr. Gwenlyn Found which showed a tight stenosis in the common femoral artery at the distal aspect of the left limb of her aortobifemoral bypass graft. She was sent for vascular consultation.  She underwent repair of an infrarenal abdominal aortic aneurysm with an aortobifemoral bypass graft by Dr. Amedeo Plenty in 1998. She's had multiple revascularization attempts in the right leg. She currently has some mild claudication bilaterally but however her activity is fairly limited by her pulmonary status.  Her risk factors for peripheral vascular disease include diabetes, hypertension, hypercholesterolemia, and a remote history of tobacco use.  Hospital Course:  The patient was admitted to the hospital and taken to the operating room on 04/19/2013 and underwent: 1. Endarterectomy of left common femoral artery with bovine pericardial patch angioplasty of distal left limb of aortofemoral bypass graft  2. Left femoral to below knee popliteal artery bypass with non-reversed translocated saphenous vein graft    The pt tolerated the procedure well and was transported to the PACU in good condition.   By POD 1, her JP drain was removed.  Physical therapy worked with pt and she is okay to be full weight bearing on LLE.  She does have a hx of CKD stage IV.  Her creatinine improved since surgery and is down to 1.79.  Post operative ABI's are as follows:  RIGHT    LEFT     PRESSURE  WAVEFORM   PRESSURE  WAVEFORM   BRACHIAL  108  Tri  BRACHIAL  103  Tri   DP    DP     AT  67   Mono  AT  238  Mono   PT  43  Mono  PT  129  Mono   PER    PER     GREAT TOE   NA  GREAT TOE   NA     RIGHT  LEFT   ABI  0.62 -may be falsely elevated due to calcified vessels.  Non compressible due to calcified vessels.    Pt did have 2 bouts of diarrhea on POD 3, but has not had anymore since then.  A C diff was sent, but the results are not back yet.  She will be started on Flagyl.  She does have a palpable left DP pulse and her foot is warm and well perfused.  The remainder of the hospital course consisted of increasing mobilization and increasing intake of solids without difficulty.  CBC    Component Value Date/Time   WBC 9.1 04/20/2013 0058   WBC 11.4* 01/25/2013 1714   RBC 3.34* 04/20/2013 0058   RBC 3.44* 01/25/2013 1714   HGB 9.6* 04/20/2013 0058   HGB 10.4* 01/25/2013 1714   HCT 29.2* 04/20/2013 0058   HCT 34.8* 01/25/2013 1714   PLT 144* 04/20/2013 0058   MCV 87.4 04/20/2013 0058   MCV 101.3* 01/25/2013 1714   MCH 28.7 04/20/2013 0058   MCH 30.2 01/25/2013 1714   MCHC 32.9 04/20/2013 0058   MCHC 29.9* 01/25/2013 1714   RDW 19.3* 04/20/2013 0058  LYMPHSABS 1.9 01/27/2013 0835   MONOABS 0.6 01/27/2013 0835   EOSABS 0.2 01/27/2013 0835   BASOSABS 0.0 01/27/2013 0835    BMET    Component Value Date/Time   NA 139 04/20/2013 0058   NA 145 08/01/2011   K 5.3 04/20/2013 0058   CL 104 04/20/2013 0058   CO2 24 04/20/2013 0058   GLUCOSE 141* 04/20/2013 0058   BUN 41* 04/20/2013 0058   BUN 37* 08/01/2011   CREATININE 1.79* 04/20/2013 0058   CREATININE 1.59* 04/07/2013 1623   CREATININE 1.5* 08/01/2011   CALCIUM 8.2* 04/20/2013 0058   GFRNONAA 24* 04/20/2013 0058   GFRAA 28* 04/20/2013 0058     Discharge Instructions:   The patient is discharged to home with extensive instructions on wound care and progressive ambulation.  They are instructed not to drive or perform any heavy lifting until returning to see the physician in his office.      Discharge Orders   Future Appointments Provider Department  Dept Phone   06/07/2013 11:45 AM Darlyne Russian, MD [facility] [phone]   Future Orders Complete By Expires   Call MD for:  redness, tenderness, or signs of infection (pain, swelling, bleeding, redness, odor or green/yellow discharge around incision site)  As directed    Call MD for:  severe or increased pain, loss or decreased feeling  in affected limb(s)  As directed    Call MD for:  temperature >100.5  As directed    Change dressing (specify)  As directed    Comments:     Dressing change to left great toe with dry dressing daily. Continue to float heels when pt is not ambulating.   Discharge instructions  As directed    Scheduling Instructions:     Warm dial soaks to left foot followed by a dry dressing daily.   Discharge wound care:  As directed    Comments:     Wash the groin wound with soap and water daily and pat dry. (No tub bath-only shower)  Then put a dry gauze or washcloth there to keep this area dry daily and as needed.  Do not use Vaseline or neosporin on your incisions.  Only use soap and water on your incisions and then protect and keep dry.   Resume previous diet  As directed       Discharge Diagnosis:  Peripheral vascular disease  Secondary Diagnosis: Patient Active Problem List   Diagnosis Date Noted  . Atherosclerotic PVD with ulceration 04/13/2013  . Critical lower limb ischemia 04/04/2013  . Claudication 04/03/2013  . UTI (lower urinary tract infection) 01/25/2013  . Urosepsis 01/25/2013  . Long term (current) use of anticoagulants 05/12/2012  . PAD (peripheral artery disease) 03/03/2012  . Liver cyst 02/26/2012  . Cyst, kidney, acquired 02/26/2012  . Kidney stone 02/26/2012  . Chronic kidney disease 11/19/2011  . COPD (chronic obstructive pulmonary disease) 03/18/2011  . Osteoporosis 12/17/2010  . Type II or unspecified type diabetes mellitus without mention of complication, not stated as uncontrolled   . PVD (peripheral vascular  disease)   . Mixed hyperlipidemia   . Renal artery stenosis   . Memory loss, short term   . Pulmonary nodule   . CAD (coronary artery disease)   . CHF (congestive heart failure)   . Anemia    Past Medical History  Diagnosis Date  . Type II or unspecified type diabetes mellitus without mention of complication, not stated as uncontrolled   .  PVD (peripheral vascular disease)     a. PTA & stent right superficial artery PTA & left common iliac artery stenting. Right fem-pop bypass graft. b. Per Dr. Kennon Holter note: h/o aortobifemoral bypass grafting remotely as well as fem-peroneal bypass grafting.  . Mixed hyperlipidemia   . Compression fx, thoracic spine     T12  . Ischium fracture   . Memory loss, short term   . Pulmonary nodule   . CAD (coronary artery disease) 10/12/2008    a. 30% LAD,30% CX & 40% tandem mid stenosis in the AV groove, 50-60% RCA  . Chronic diastolic CHF (congestive heart failure) 09-2008    a. EF 40% by cath 2010. b. Improved - last echo 2015 with normal EF.  [name] Anemia   . Detached retina 02-2005    R EYE  . Fracture 02-2007    L5 AND PELVIS  . Left ventricular dysfunction     a. EF 40% by cath 2010. b. Improved - last echo 2015 with normal EF.   [name] AAA (abdominal aortic aneurysm)     Repair unknown date  . Critical lower limb ischemia   . Chronic respiratory failure     a. On home O2.  [name] Hypertension   . Warfarin anticoagulation     DC summary from 2010 reports she is "on chronic  Coumadin for vascular disease." This is listed on her med list going back to 2006 by records available. Teaching service H&P remotely says she has h/o DVT but I cannot find any evidence of this. Admission H/P 03/2013 suggests history of atrial fib but all ECGs appear sinus and there is no mention of this previously.  [name] COPD (chronic obstructive pulmonary disease)   . Renal artery stenosis     a. 1-59% by doppler in 2014.       Medication List         acetaminophen 500 MG tablet   Commonly known as:  TYLENOL  Take 1,000 mg by mouth every 4 (four) hours as needed for moderate pain.     albuterol (2.5 MG/3ML) 0.083% nebulizer solution  Commonly known as:  PROVENTIL  Take 2.5 mg by nebulization 3 (three) times daily as needed for wheezing or shortness of breath.     aspirin 325 MG EC tablet  Take 1 tablet (325 mg total) by mouth daily.     budesonide-formoterol 160-4.5 MCG/ACT inhaler  Commonly known as:  SYMBICORT  Inhale 2 puffs into the lungs 2 (two) times daily.     CALCIUM 600 + D PO  Take 600 mg by mouth daily.     COMBIVENT RESPIMAT 20-100 MCG/ACT Aers respimat  Generic drug:  Ipratropium-Albuterol  Inhale 2 puffs into the lungs every 6 (six) hours as needed for wheezing or shortness of breath.     esomeprazole 40 MG capsule  Commonly known as:  NEXIUM  Take 40 mg by mouth daily before breakfast.     ferrous sulfate 325 (65 FE) MG tablet  Take 325 mg by mouth at bedtime.     Fish Oil 1200 MG Caps  Take 1,200 mg by mouth 2 (two) times daily.     furosemide 40 MG tablet  Commonly known as:  LASIX  Take 40 mg by mouth daily.     HYDROcodone-acetaminophen 7.5-325 MG per tablet  Commonly known as:  NORCO  Take 1 tablet by mouth every 4 (four) hours as needed for moderate pain.     ibandronate 150 MG tablet  Commonly known as:  BONIVA  Take 150 mg by mouth every 30 (thirty) days. Take in the morning with a full glass of water, on an empty stomach, and do not take anything else by mouth or lie down for the next 30 min. (take on the 25th of each month)     Insulin Glargine 100 UNIT/ML Solostar Pen  Commonly known as:  LANTUS  Inject 16 Units into the skin daily at 10 pm.     lidocaine 5 %  Commonly known as:  LIDODERM  Place 1 patch onto the skin daily. Remove & Discard patch within 12 hours or as directed by MD     losartan 100 MG tablet  Commonly known as:  COZAAR  Take 100 mg by mouth daily.     metoprolol succinate 25 MG 24 hr tablet   Commonly known as:  TOPROL-XL  Take 25 mg by mouth daily.     metroNIDAZOLE 250 MG tablet  Commonly known as:  FLAGYL  Take 1 tablet (250 mg total) by mouth 3 (three) times daily.     multivitamin with minerals Tabs tablet  Take 1 tablet by mouth at bedtime.     ranitidine 150 MG tablet  Commonly known as:  ZANTAC  Take 150 mg by mouth 2 (two) times daily.     rosuvastatin 20 MG tablet  Commonly known as:  CRESTOR  Take 20 mg by mouth daily. PATIENT NEEDS OFFICE VISIT/LABS FOR ADDITIONAL REFILLS        Norco #30 No Refill  Disposition: SNF  Patient's condition: is Good  Follow up: 1. Dr. Scot Dock in 4 weeks   Leontine Locket, PA-C [facility] [phone] 04/23/2013  9:08 AM  - For VQI Registry use --- Instructions: Press F2 to tab through selections.  Delete question if not applicable.   Post-op:  Wound infection: No  Graft infection: No  Transfusion: Yes  If yes, 2 units given New Arrhythmia: No Ipsilateral amputation: No, [ ]  Minor, [ ]  BKA, [ ]  AKA Discharge patency: [x ] Primary, [ ]  Primary assisted, [ ]  Secondary, [ ]  Occluded Patency judged by: [ ]  Dopper only, [ ]  Palpable graft pulse, [ ]  Palpable distal pulse, [ ]  ABI inc. > 0.15, [ ]  Duplex Discharge ABI: R 0.62, L non compressible (pt has palpable left DP) Discharge TBI: R , L  D/C Ambulatory Status: Ambulatory with Assistance  Complications: MI: No, [ ]  Troponin only, [ ]  EKG or Clinical CHF: No Resp failure:No, [ ]  Pneumonia, [ ]  Ventilator Chg in renal function: No, [ ]  Inc. Cr > 0.5, [ ]  Temp. Dialysis, [ ]  Permanent dialysis Stroke: No, [ ]  Minor, [ ]  Major Return to OR: No  Reason for return to OR: [ ]  Bleeding, [ ]  Infection, [ ]  Thrombosis, [ ]  Revision  Discharge medications: Statin use:  yes ASA use:  yes Plavix use:  no Beta blocker use: yes Coumadin use: no    I have examined the patient, reviewed and agree with above.  EARLY, TODD, MD 04/23/2013 10:17  AM

## 2013-04-25 ENCOUNTER — Other Ambulatory Visit: Payer: Self-pay | Admitting: *Deleted

## 2013-04-25 ENCOUNTER — Telehealth: Payer: Self-pay | Admitting: Vascular Surgery

## 2013-04-25 MED ORDER — HYDROCODONE-ACETAMINOPHEN 7.5-325 MG PO TABS: ORAL_TABLET | ORAL | Status: DC

## 2013-04-25 NOTE — Telephone Encounter (Signed)
[place]

## 2013-04-25 NOTE — Telephone Encounter (Addendum)
Message copied by Gena Fray on Mon Apr 25, 2013 10:46 AM ------      Message from: Mena Goes      Created: Fri Apr 22, 2013  3:01 PM      Regarding: schedule                   ----- Message -----         From: Ulyses Amor, PA-C         Sent: 04/22/2013   2:16 PM           To: [facility] Charge Pool            Left Fem-pop by pass f/u in 4 weeks with Dr. Scot Dock.  She is being discharged to a SNF ------  04/25/13: spoke with Hilda Blades on the [place] at [facility] to schedule, dpm

## 2013-04-27 ENCOUNTER — Non-Acute Institutional Stay (SKILLED_NURSING_FACILITY): Payer: Medicare Other | Admitting: Internal Medicine

## 2013-04-27 ENCOUNTER — Encounter: Payer: Self-pay | Admitting: Internal Medicine

## 2013-04-27 DIAGNOSIS — I739 Peripheral vascular disease, unspecified: Secondary | ICD-10-CM

## 2013-04-27 DIAGNOSIS — L03116 Cellulitis of left lower limb: Secondary | ICD-10-CM

## 2013-04-27 DIAGNOSIS — N183 Chronic kidney disease, stage 3 unspecified: Secondary | ICD-10-CM

## 2013-04-27 DIAGNOSIS — L03119 Cellulitis of unspecified part of limb: Secondary | ICD-10-CM

## 2013-04-27 DIAGNOSIS — E119 Type 2 diabetes mellitus without complications: Secondary | ICD-10-CM

## 2013-04-27 DIAGNOSIS — E782 Mixed hyperlipidemia: Secondary | ICD-10-CM

## 2013-04-27 DIAGNOSIS — I509 Heart failure, unspecified: Secondary | ICD-10-CM

## 2013-04-27 DIAGNOSIS — J449 Chronic obstructive pulmonary disease, unspecified: Secondary | ICD-10-CM

## 2013-04-27 DIAGNOSIS — M810 Age-related osteoporosis without current pathological fracture: Secondary | ICD-10-CM

## 2013-04-27 DIAGNOSIS — L02419 Cutaneous abscess of limb, unspecified: Secondary | ICD-10-CM

## 2013-04-27 NOTE — Assessment & Plan Note (Signed)
Pt on calcium and boniva

## 2013-04-27 NOTE — Assessment & Plan Note (Signed)
Continue current inhalers;stable

## 2013-04-27 NOTE — Assessment & Plan Note (Signed)
Continue current insulin regimen;pt on ARB and statin

## 2013-04-27 NOTE — Assessment & Plan Note (Signed)
Pt on lasix, bblocker, ARB statin and ASA and stable

## 2013-04-27 NOTE — Assessment & Plan Note (Signed)
Continue crestor and fish oil; LDSL 48, HDL 47

## 2013-04-27 NOTE — Assessment & Plan Note (Addendum)
S/p endarterectomy of L common femoral artery with bovine pericardial patch angioplasty of distal L limb of aortofemoral bypass graft and below the knee popliteal atery bypass with saphenous vein graft; pt is doing well with therapy;today redness  ,heat and TTP medial leg distal to incision L leg;no drainage form incision;start doxycycline 100 mg BID for 7 days for cellulitis

## 2013-04-27 NOTE — Assessment & Plan Note (Signed)
Cr 1.79 to 2.0 range

## 2013-04-27 NOTE — Progress Notes (Signed)
MRN: [id number] Name: Suzanne [name]  Sex: female Age: 80 y.o. DOB: 01/04/1922  [facility] #: [facility] Facility/Room: 307A Level Of Care: SNF Provider: Inocencio Homes D Emergency Contacts: Extended Emergency Contact Information Primary Emergency Contact: Smithey,Gina Address: [place], [place] Phone: [phone] Mobile Phone: [phone] Relation: Daughter   Allergies: Codeine; Hydrochlorothiazide; Pregabalin; and Other  Chief Complaint  Patient presents with  . nursing home admission    HPI: Patient is 80 y.o. female who is admitted to SNF for OT/PT after a vascular operation for L leg ischemia.  Past Medical History  Diagnosis Date  . PVD (peripheral vascular disease)     a. PTA & stent right superficial artery PTA & left common iliac artery stenting. Right fem-pop bypass graft. b. Per Dr. Kennon Holter note: h/o aortobifemoral bypass grafting remotely as well as fem-peroneal bypass grafting.  . Mixed hyperlipidemia   . Compression fx, thoracic spine     T12  . Ischium fracture   . Memory loss, short term   . Pulmonary nodule   . CAD (coronary artery disease) 10/12/2008    a. 30% LAD,30% CX & 40% tandem mid stenosis in the AV groove, 50-60% RCA  . Chronic diastolic CHF (congestive heart failure) 09-2008    a. EF 40% by cath 2010. b. Improved - last echo 2015 with normal EF.  [name] Anemia   . Detached retina 02-2005    R EYE  . Fracture 02-2007    L5 AND PELVIS  . Left ventricular dysfunction     a. EF 40% by cath 2010. b. Improved - last echo 2015 with normal EF.   [name] AAA (abdominal aortic aneurysm)     Repair unknown date  . Critical lower limb ischemia   . Chronic respiratory failure     a. On home O2.  [name] Hypertension   . Warfarin anticoagulation     DC summary from 2010 reports she is "on chronic  Coumadin for vascular disease." This is listed on her med list going back to 2006 by records available. Teaching service H&P  remotely says she has h/o DVT but I cannot find any evidence of this. Admission H/P 03/2013 suggests history of atrial fib but all ECGs appear sinus and there is no mention of this previously.  [name] COPD (chronic obstructive pulmonary disease)   . Renal artery stenosis     a. 1-59% by doppler in 2014.  . Type II or unspecified type diabetes mellitus without mention of complication, not stated as uncontrolled     Past Surgical History  Procedure Laterality Date  . Aorta surgery      aortic anyuersm surgery.   . Vessel surgery      S/P aortobifemoral bypass grafting and right fem-peroneal bypass grafting remotely  . Cholecystectomy    . Cataract surgery  06-2006    R EYE  . Vascular surgery    . Cardiac catheterization    . Eye surgery      detached retina  (r)  . Eye surgery      cataract  OD  . Fracture surgery      sternum  2008  . Patch angioplasty Left 04/19/2013    Procedure: VEIN PATCH ANGIOPLASTY - LEFT COMMON FEMORAL ARTERY;  Surgeon: Angelia Mould, MD;  Location: [facility];  Service: Vascular;  Laterality: Left;  . Femoral-popliteal bypass graft Left 04/19/2013    Procedure: BYPASS  GRAFT FEMORAL- BELOW KNEE POPLITEAL ARTERY WITH VEIN;  Surgeon: Angelia Mould, MD;  Location: [facility];  Service: Vascular;  Laterality: Left;      Medication List       This list is accurate as of: 04/27/13 12:04 PM.  Always use your most recent med list.               acetaminophen 500 MG tablet  Commonly known as:  TYLENOL  Take 1,000 mg by mouth every 4 (four) hours as needed for moderate pain.     albuterol (2.5 MG/3ML) 0.083% nebulizer solution  Commonly known as:  PROVENTIL  Take 2.5 mg by nebulization 3 (three) times daily as needed for wheezing or shortness of breath.     aspirin 325 MG EC tablet  Take 1 tablet (325 mg total) by mouth daily.     budesonide-formoterol 160-4.5 MCG/ACT inhaler  Commonly known as:  SYMBICORT  Inhale 2 puffs into the lungs 2 (two) times  daily.     CALCIUM 600 + D PO  Take 600 mg by mouth daily.     COMBIVENT RESPIMAT 20-100 MCG/ACT Aers respimat  Generic drug:  Ipratropium-Albuterol  Inhale 2 puffs into the lungs every 6 (six) hours as needed for wheezing or shortness of breath.     esomeprazole 40 MG capsule  Commonly known as:  NEXIUM  Take 40 mg by mouth daily before breakfast.     ferrous sulfate 325 (65 FE) MG tablet  Take 325 mg by mouth at bedtime.     Fish Oil 1200 MG Caps  Take 1,200 mg by mouth 2 (two) times daily.     furosemide 40 MG tablet  Commonly known as:  LASIX  Take 40 mg by mouth daily.     HYDROcodone-acetaminophen 7.5-325 MG per tablet  Commonly known as:  NORCO  Take one tablet by mouth every four hours as needed for pain     ibandronate 150 MG tablet  Commonly known as:  BONIVA  Take 150 mg by mouth every 30 (thirty) days. Take in the morning with a full glass of water, on an empty stomach, and do not take anything else by mouth or lie down for the next 30 min. (take on the 25th of each month)     Insulin Glargine 100 UNIT/ML Solostar Pen  Commonly known as:  LANTUS  Inject 16 Units into the skin daily at 10 pm.     lidocaine 5 %  Commonly known as:  LIDODERM  Place 1 patch onto the skin daily. Remove & Discard patch within 12 hours or as directed by MD     losartan 100 MG tablet  Commonly known as:  COZAAR  Take 100 mg by mouth daily.     metoprolol succinate 25 MG 24 hr tablet  Commonly known as:  TOPROL-XL  Take 25 mg by mouth daily.     multivitamin with minerals Tabs tablet  Take 1 tablet by mouth at bedtime.     ranitidine 150 MG tablet  Commonly known as:  ZANTAC  Take 150 mg by mouth 2 (two) times daily.     rosuvastatin 20 MG tablet  Commonly known as:  CRESTOR  Take 20 mg by mouth daily. PATIENT NEEDS OFFICE VISIT/LABS FOR ADDITIONAL REFILLS        No orders of the defined types were placed in this encounter.    Immunization History  Administered  Date(s) Administered  . Influenza Split 12/10/2010,  12/16/2011  . Influenza,inj,Quad PF,36+ Mos 11/16/2012  . Td 04/15/2010    History  Substance Use Topics  . Smoking status: Former Smoker    Types: Cigarettes    Quit date: 02/16/1997  . Smokeless tobacco: Never Used  . Alcohol Use: No    Family history is noncontributory    Review of Systems  DATA OBTAINED: from patient; pt has no c/o;has not noted L ankle AREA IS RED GENERAL: Feels well no fevers, fatigue, appetite changes SKIN: No itching, rash; wounds are doing well EYES: No eye pain, redness, discharge EARS: No earache, tinnitus, change in hearing NOSE: No congestion, drainage or bleeding  MOUTH/THROAT: No mouth or tooth pain, No sore throat, No difficulty chewing or swallowing  RESPIRATORY: No cough, wheezing, SOB CARDIAC: No chest pain, palpitations, lower extremity edema  GI: No abdominal pain, No N/V/D or constipation, No heartburn or reflux  GU: No dysuria, frequency or urgency, or incontinence  MUSCULOSKELETAL: No unrelieved bone/joint pain NEUROLOGIC: No headache, dizziness or focal weakness PSYCHIATRIC: No overt anxiety or sadness. Sleeps well. No behavior issue.   Filed Vitals:   04/27/13 1135  BP: 115/68  Pulse: 76  Temp: 97.4 F (36.3 C)  Resp: 20    Physical Exam  GENERAL APPEARANCE: Alert, conversant. Appropriately groomed. No acute distress.  SKIN: No diaphoresis rash; incisions L leg healing with no drainage ot TTP; distally there is area of redness, heat and TTP HEAD: Normocephalic, atraumatic  EYES: Conjunctiva/lids clear. Pupils round, reactive. EOMs intact.  EARS: External exam WNL, canals clear. Hearing grossly normal.  NOSE: No deformity or discharge.  MOUTH/THROAT: Lips w/o lesions.  RESPIRATORY: Breathing is even, unlabored. Lung sounds are diffusely decreased, no wheezing  CARDIOVASCULAR: Heart RRR no murmurs, rubs or gallops. No peripheral edema.  GASTROINTESTINAL: Abdomen is soft,  non-tender, not distended w/ normal bowel sounds GENITOURINARY: Bladder non tender, not distended  MUSCULOSKELETAL: No abnormal joints or musculature NEUROLOGIC: Oriented X3. Cranial nerves 2-12 grossly intact. Moves all extremities no tremor. PSYCHIATRIC: Mood and affect appropriate to situation, no behavioral issues  Patient Active Problem List   Diagnosis Date Noted  . Atherosclerotic PVD with ulceration 04/13/2013  . Critical lower limb ischemia 04/04/2013  . Claudication 04/03/2013  . UTI (lower urinary tract infection) 01/25/2013  . Urosepsis 01/25/2013  . Long term (current) use of anticoagulants 05/12/2012  . PAD (peripheral artery disease) 03/03/2012  . Liver cyst 02/26/2012  . Cyst, kidney, acquired 02/26/2012  . Kidney stone 02/26/2012  . Chronic kidney disease, stage 3 11/19/2011  . COPD (chronic obstructive pulmonary disease) 03/18/2011  . Osteoporosis, unspecified 12/17/2010  . Type II or unspecified type diabetes mellitus without mention of complication, not stated as uncontrolled   . PVD (peripheral vascular disease)   . Mixed hyperlipidemia   . Renal artery stenosis   . Memory loss, short term   . Pulmonary nodule   . CAD (coronary artery disease)   . CHF (congestive heart failure)   . Anemia     CBC    Component Value Date/Time   WBC 9.1 04/20/2013 0058   WBC 11.4* 01/25/2013 1714   RBC 3.34* 04/20/2013 0058   RBC 3.44* 01/25/2013 1714   HGB 9.6* 04/20/2013 0058   HGB 10.4* 01/25/2013 1714   HCT 29.2* 04/20/2013 0058   HCT 34.8* 01/25/2013 1714   PLT 144* 04/20/2013 0058   MCV 87.4 04/20/2013 0058   MCV 101.3* 01/25/2013 1714   LYMPHSABS 1.9 01/27/2013 0835   MONOABS 0.6 01/27/2013  0835   EOSABS 0.2 01/27/2013 0835   BASOSABS 0.0 01/27/2013 0835    CMP     Component Value Date/Time   NA 139 04/20/2013 0058   NA 145 08/01/2011   K 5.3 04/20/2013 0058   CL 104 04/20/2013 0058   CO2 24 04/20/2013 0058   GLUCOSE 141* 04/20/2013 0058   BUN 41* 04/20/2013 0058   BUN 37*  08/01/2011   CREATININE 1.79* 04/20/2013 0058   CREATININE 1.59* 04/07/2013 1623   CREATININE 1.5* 08/01/2011   CALCIUM 8.2* 04/20/2013 0058   PROT 7.4 04/18/2013 1454   ALBUMIN 3.5 04/18/2013 1454   AST 23 04/18/2013 1454   ALT 15 04/18/2013 1454   ALKPHOS 55 04/18/2013 1454   BILITOT 0.3 04/18/2013 1454   GFRNONAA 24* 04/20/2013 0058   GFRAA 28* 04/20/2013 0058    Assessment and Plan  PVD (peripheral vascular disease) S/p endarterectomy of L common femoral artery with bovine pericardial patch angioplasty of distal L limb of aortofemoral bypass graft and below the knee popliteal atery bypass with saphenous vein graft; pt is doing well with therapy;today redness  ,heat and TTP medial leg distal to incision L leg;no drainage form incision;start doxycycline 100 mg BID for 7 days for cellulitis  Osteoporosis, unspecified Pt on calcium and boniva  Chronic kidney disease, stage 3 Cr 1.79 to 2.0 range  Type II or unspecified type diabetes mellitus without mention of complication, not stated as uncontrolled Continue current insulin regimen;pt on ARB and statin  COPD (chronic obstructive pulmonary disease) Continue current inhalers;stable  CHF (congestive heart failure) Pt on lasix, bblocker, ARB statin and ASA and stable  Mixed hyperlipidemia Continue crestor and fish oil; LDSL 48, HDL 47  CELLULITIS LLE- doxycycline 100 mg BID for 7 days started  Hennie Duos, MD

## 2013-05-16 ENCOUNTER — Encounter: Payer: Self-pay | Admitting: Internal Medicine

## 2013-05-16 ENCOUNTER — Non-Acute Institutional Stay (SKILLED_NURSING_FACILITY): Payer: Medicare Other | Admitting: Internal Medicine

## 2013-05-16 DIAGNOSIS — E119 Type 2 diabetes mellitus without complications: Secondary | ICD-10-CM

## 2013-05-16 DIAGNOSIS — I739 Peripheral vascular disease, unspecified: Secondary | ICD-10-CM

## 2013-05-16 DIAGNOSIS — N183 Chronic kidney disease, stage 3 unspecified: Secondary | ICD-10-CM

## 2013-05-16 DIAGNOSIS — J449 Chronic obstructive pulmonary disease, unspecified: Secondary | ICD-10-CM

## 2013-05-16 DIAGNOSIS — E782 Mixed hyperlipidemia: Secondary | ICD-10-CM

## 2013-05-16 DIAGNOSIS — I509 Heart failure, unspecified: Secondary | ICD-10-CM

## 2013-05-16 NOTE — Progress Notes (Signed)
MRN: [id number] Name: Suzanne [name]  Sex: female Age: 80 y.o. DOB: [birth year]  [facility] #: [facility] Facility/Room: 307 Level Of Care: SNF Provider: Inocencio Homes D Emergency Contacts: Extended Emergency Contact Information Primary Emergency Contact: Smithey,Gina Address: [place], [place] Phone: [phone] Mobile Phone: [phone] Relation: Daughter  Code Status: DNR  Allergies: Codeine; Hydrochlorothiazide; Pregabalin; and Other  Chief Complaint  Patient presents with  . Discharge Note    HPI: Patient is 80 y.o. female who is being discharged to home after rehabing from vascular surgery.  Past Medical History  Diagnosis Date  . PVD (peripheral vascular disease)     a. PTA & stent right superficial artery PTA & left common iliac artery stenting. Right fem-pop bypass graft. b. Per Dr. Kennon Holter note: h/o aortobifemoral bypass grafting remotely as well as fem-peroneal bypass grafting.  . Mixed hyperlipidemia   . Compression fx, thoracic spine     T12  . Ischium fracture   . Memory loss, short term   . Pulmonary nodule   . CAD (coronary artery disease) 10/12/2008    a. 30% LAD,30% CX & 40% tandem mid stenosis in the AV groove, 50-60% RCA  . Chronic diastolic CHF (congestive heart failure) 09-2008    a. EF 40% by cath 2010. b. Improved - last echo 2015 with normal EF.  [name] Anemia   . Detached retina 02-2005    R EYE  . Fracture 02-2007    L5 AND PELVIS  . Left ventricular dysfunction     a. EF 40% by cath 2010. b. Improved - last echo 2015 with normal EF.   [name] AAA (abdominal aortic aneurysm)     Repair unknown date  . Critical lower limb ischemia   . Chronic respiratory failure     a. On home O2.  [name] Hypertension   . Warfarin anticoagulation     DC summary from 2010 reports she is "on chronic  Coumadin for vascular disease." This is listed on her med list going back to 2006 by records available. Teaching service H&P  remotely says she has h/o DVT but I cannot find any evidence of this. Admission H/P 03/2013 suggests history of atrial fib but all ECGs appear sinus and there is no mention of this previously.  [name] COPD (chronic obstructive pulmonary disease)   . Renal artery stenosis     a. 1-59% by doppler in 2014.  . Type II or unspecified type diabetes mellitus without mention of complication, not stated as uncontrolled     Past Surgical History  Procedure Laterality Date  . Aorta surgery      aortic anyuersm surgery.   . Vessel surgery      S/P aortobifemoral bypass grafting and right fem-peroneal bypass grafting remotely  . Cholecystectomy    . Cataract surgery  06-2006    R EYE  . Vascular surgery    . Cardiac catheterization    . Eye surgery      detached retina  (r)  . Eye surgery      cataract  OD  . Fracture surgery      sternum  2008  . Patch angioplasty Left 04/19/2013    Procedure: VEIN PATCH ANGIOPLASTY - LEFT COMMON FEMORAL ARTERY;  Surgeon: Angelia Mould, MD;  Location: [facility];  Service: Vascular;  Laterality: Left;  . Femoral-popliteal bypass graft Left 04/19/2013    Procedure: BYPASS GRAFT FEMORAL-  BELOW KNEE POPLITEAL ARTERY WITH VEIN;  Surgeon: Angelia Mould, MD;  Location: [facility];  Service: Vascular;  Laterality: Left;      Medication List       This list is accurate as of: 05/16/13  5:39 PM.  Always use your most recent med list.               acetaminophen 500 MG tablet  Commonly known as:  TYLENOL  Take 1,000 mg by mouth every 4 (four) hours as needed for moderate pain.     albuterol (2.5 MG/3ML) 0.083% nebulizer solution  Commonly known as:  PROVENTIL  Take 2.5 mg by nebulization 3 (three) times daily as needed for wheezing or shortness of breath.     aspirin 325 MG EC tablet  Take 1 tablet (325 mg total) by mouth daily.     budesonide-formoterol 160-4.5 MCG/ACT inhaler  Commonly known as:  SYMBICORT  Inhale 2 puffs into the lungs 2 (two) times  daily.     COMBIVENT RESPIMAT 20-100 MCG/ACT Aers respimat  Generic drug:  Ipratropium-Albuterol  Inhale 2 puffs into the lungs every 6 (six) hours as needed for wheezing or shortness of breath.     esomeprazole 40 MG capsule  Commonly known as:  NEXIUM  Take 40 mg by mouth daily before breakfast.     Fish Oil 1200 MG Caps  Take 1,200 mg by mouth 2 (two) times daily.     furosemide 40 MG tablet  Commonly known as:  LASIX  Take 40 mg by mouth daily.     HYDROcodone-acetaminophen 7.5-325 MG per tablet  Commonly known as:  NORCO  Take one tablet by mouth every four hours as needed for pain     ibandronate 150 MG tablet  Commonly known as:  BONIVA  Take 150 mg by mouth every 30 (thirty) days. Take in the morning with a full glass of water, on an empty stomach, and do not take anything else by mouth or lie down for the next 30 min. (take on the 25th of each month)     Insulin Glargine 100 UNIT/ML Solostar Pen  Commonly known as:  LANTUS  Inject 16 Units into the skin daily at 10 pm.     losartan 100 MG tablet  Commonly known as:  COZAAR  Take 100 mg by mouth daily.     metoprolol succinate 25 MG 24 hr tablet  Commonly known as:  TOPROL-XL  Take 25 mg by mouth daily.     ranitidine 150 MG tablet  Commonly known as:  ZANTAC  Take 150 mg by mouth 2 (two) times daily.     rosuvastatin 20 MG tablet  Commonly known as:  CRESTOR  Take 20 mg by mouth daily. PATIENT NEEDS OFFICE VISIT/LABS FOR ADDITIONAL REFILLS        No orders of the defined types were placed in this encounter.    Immunization History  Administered Date(s) Administered  . Influenza Split 12/10/2010, 12/16/2011  . Influenza,inj,Quad PF,36+ Mos 11/16/2012  . Td 04/15/2010    History  Substance Use Topics  . Smoking status: Former Smoker    Types: Cigarettes    Quit date: 02/16/1997  . Smokeless tobacco: Never Used  . Alcohol Use: No    Filed Vitals:   05/16/13 1057  BP: 112/64  Pulse: 76   Temp: 98 F (36.7 C)  Resp: 17    Physical Exam  GENERAL APPEARANCE: Alert, conversant. Appropriately groomed. No acute distress.  HEENT: Unremarkable. RESPIRATORY: Breathing is even, unlabored. Lung sounds are clear   CARDIOVASCULAR: Heart RRR no murmurs, rubs or gallops. No peripheral edema.  GASTROINTESTINAL: Abdomen is soft, non-tender, not distended w/ normal bowel sounds.  NEUROLOGIC: Cranial nerves 2-12 grossly intact. Moves all extremities no tremor.  Patient Active Problem List   Diagnosis Date Noted  . Atherosclerotic PVD with ulceration 04/13/2013  . Critical lower limb ischemia 04/04/2013  . Claudication 04/03/2013  . UTI (lower urinary tract infection) 01/25/2013  . Urosepsis 01/25/2013  . Long term (current) use of anticoagulants 05/12/2012  . PAD (peripheral artery disease) 03/03/2012  . Liver cyst 02/26/2012  . Cyst, kidney, acquired 02/26/2012  . Kidney stone 02/26/2012  . Chronic kidney disease, stage 3 11/19/2011  . COPD (chronic obstructive pulmonary disease) 03/18/2011  . Osteoporosis, unspecified 12/17/2010  . Type II or unspecified type diabetes mellitus without mention of complication, not stated as uncontrolled   . PVD (peripheral vascular disease)   . Mixed hyperlipidemia   . Renal artery stenosis   . Memory loss, short term   . Pulmonary nodule   . CAD (coronary artery disease)   . CHF (congestive heart failure)   . Anemia     CBC    Component Value Date/Time   WBC 9.1 04/20/2013 0058   WBC 11.4* 01/25/2013 1714   RBC 3.34* 04/20/2013 0058   RBC 3.44* 01/25/2013 1714   HGB 9.6* 04/20/2013 0058   HGB 10.4* 01/25/2013 1714   HCT 29.2* 04/20/2013 0058   HCT 34.8* 01/25/2013 1714   PLT 144* 04/20/2013 0058   MCV 87.4 04/20/2013 0058   MCV 101.3* 01/25/2013 1714   LYMPHSABS 1.9 01/27/2013 0835   MONOABS 0.6 01/27/2013 0835   EOSABS 0.2 01/27/2013 0835   BASOSABS 0.0 01/27/2013 0835    CMP     Component Value Date/Time   NA 139 04/20/2013 0058   NA  145 08/01/2011   K 5.3 04/20/2013 0058   CL 104 04/20/2013 0058   CO2 24 04/20/2013 0058   GLUCOSE 141* 04/20/2013 0058   BUN 41* 04/20/2013 0058   BUN 37* 08/01/2011   CREATININE 1.79* 04/20/2013 0058   CREATININE 1.59* 04/07/2013 1623   CREATININE 1.5* 08/01/2011   CALCIUM 8.2* 04/20/2013 0058   PROT 7.4 04/18/2013 1454   ALBUMIN 3.5 04/18/2013 1454   AST 23 04/18/2013 1454   ALT 15 04/18/2013 1454   ALKPHOS 55 04/18/2013 1454   BILITOT 0.3 04/18/2013 1454   GFRNONAA 24* 04/20/2013 0058   GFRAA 28* 04/20/2013 0058    Assessment and Plan  Pt is being discharged to home in improved and stable condition with HH/OT/PT.  Hennie Duos, MD

## 2013-05-17 ENCOUNTER — Encounter: Payer: Self-pay | Admitting: Vascular Surgery

## 2013-05-18 ENCOUNTER — Encounter: Payer: Self-pay | Admitting: Vascular Surgery

## 2013-05-18 ENCOUNTER — Ambulatory Visit (INDEPENDENT_AMBULATORY_CARE_PROVIDER_SITE_OTHER): Payer: Self-pay | Admitting: Vascular Surgery

## 2013-05-18 VITALS — BP 142/45 | HR 72 | Temp 98.7°F | Ht 62.0 in | Wt 142.0 lb

## 2013-05-18 DIAGNOSIS — I739 Peripheral vascular disease, unspecified: Secondary | ICD-10-CM

## 2013-05-18 DIAGNOSIS — I70209 Unspecified atherosclerosis of native arteries of extremities, unspecified extremity: Secondary | ICD-10-CM

## 2013-05-18 DIAGNOSIS — L98499 Non-pressure chronic ulcer of skin of other sites with unspecified severity: Secondary | ICD-10-CM

## 2013-05-18 NOTE — Progress Notes (Signed)
   Patient name: Suzanne [name] MRN: [id number] DOB: 09/26/1921 Sex: female  REASON FOR VISIT: Follow up after left femoropopliteal bypass graft  HPI: Suzanne [name] is a 80 y.o. female who had presented with a nonhealing wound of her left foot with multilevel arterial occlusive disease. She undergone previous aortofemoral bypass graft for aneurysm by Dr. Amedeo Plenty. She had an arteriogram by Dr. Quay Burow which a tight stenosis in the common femoral artery was a large calcific plaque present. Given the wound on her left foot it was felt that her best chance for limb salvage was attempted revascularization.  She underwent endarterectomy the left common femoral artery with bovine pericardial patch angioplasty of the distal limb of her aortofemoral bypass graft. In addition she had a left femoral to below knee popliteal artery bypass with a vein graft. This was on 04/19/2013. Returns for her first outpatient visit.  She has no specific complaints. She is still at [facility]. She has been doing without difficulty and has no significant pain in her left lower extremity. She denies fever or chills.   REVIEW OF SYSTEMS: Valu.Nieves ] denotes positive finding; [  ] denotes negative finding  CARDIOVASCULAR:  [ ]  chest pain   [ ]  dyspnea on exertion    CONSTITUTIONAL:  [ ]  fever   [ ]  chills  PHYSICAL EXAM: Filed Vitals:   05/18/13 1552  BP: 142/45  Pulse: 72  Temp: 98.7 F (37.1 C)  TempSrc: Oral  Height: 5\' 2"  (1.575 m)  Weight: 142 lb (64.411 kg)  SpO2: 93%   Body mass index is 25.97 kg/(m^2). GENERAL: The patient is a well-nourished female, in no acute distress. The vital signs are documented above. CARDIOVASCULAR: There is a regular rate and rhythm. PULMONARY: There is good air exchange bilaterally without wheezing or rales. Her incisions are all healing nicely. She has a palpable left femoral, popliteal, and dorsalis pedis pulse. 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  MEDICAL ISSUES: The patient is doing well status post left lower extremity revascularization. I'll see her back in 6 weeks to check on the left great toe. She knows to call sooner if she has problems. We've discussed the importance of intermittent leg elevation the proper positioning for this. I've also written for them to soak her foot daily in lukewarm dial soap soaks. Dr.  Gwenlyn Found follows her right lower extremity slight explained to the family that he can also follow the bypass graft on the left to save her some visits. Overall this with her progress and I think her wound on the left great toe should heal without any problem.  [facility] [place]: [phone]

## 2013-05-21 ENCOUNTER — Other Ambulatory Visit: Payer: Self-pay | Admitting: Emergency Medicine

## 2013-06-07 ENCOUNTER — Ambulatory Visit (INDEPENDENT_AMBULATORY_CARE_PROVIDER_SITE_OTHER): Payer: Medicare Other | Admitting: Emergency Medicine

## 2013-06-07 VITALS — BP 147/63 | HR 65 | Temp 97.3°F | Resp 16 | Ht 62.5 in | Wt 140.0 lb

## 2013-06-07 DIAGNOSIS — E119 Type 2 diabetes mellitus without complications: Secondary | ICD-10-CM

## 2013-06-07 LAB — GLUCOSE, POCT (MANUAL RESULT ENTRY): POC Glucose: 118 mg/dl — AB (ref 70–99)

## 2013-06-07 MED ORDER — HYDROCODONE-ACETAMINOPHEN 7.5-325 MG PO TABS: ORAL_TABLET | ORAL | Status: DC

## 2013-06-07 NOTE — Patient Instructions (Signed)
Take an aspirin every day with food. Decrease your insulin to 14 units and down to 12 units if necessary to avoid low sugars. You should use oxygen when you're physically active and be sure you use your oxygen every night.

## 2013-06-07 NOTE — Progress Notes (Signed)
Subjective:  This chart was scribed for Arlyss Queen, MD by Roxan Diesel, Scribe.  This patient was seen in [place] [facility] and the patient's care was started at 12:24 PM.   Patient ID: Suzanne [name], female    DOB: Dec 03, 1921, 80 y.o.   MRN: [id number]  Chief Complaint  Patient presents with   Follow-up    surgery    HPI  HPI Comments: Suzanne [name] is a 80 y.o. female who presents to [facility] for a f/u on her leg surgery.  Pt has PAD and had vascular surgery of her left lower leg last month (04/19/2013) due to critical lower limb ischemia.  In a f/u visit with her surgeon on 05/18/13 she was determined to be doing well post-surgery.  She states she is continuing to do well and wants to go back to swimming class.  Pt has no major complaints and states she has been taking care of herself.  Daughter also states pt is doing well overall and has been regaining her ability to walk since the surgery  However daughter has 5 main concerns currently about which pt states "I fuss but she's right"-  1. Pt has COPD and CHF and has not been using oxygen at all recently.  Pt reports her breathing has been good so she has not been using oxygen or her nebulizer.  She does occasionally use her inhaler.  She reports she was told by her physical therapist that she does not need to use oxygen.  However her cardiologist and pulmonologist have told her to use it.  She states she does not want to use her oxygen when she goes to her swimming class and she plans to go early so that she can rest and catch her breath when she gets there.  2. Since pt was taken off of Coumadin in February daughter reports her BP has seemed lower.    3. She also states pt was advised take one aspirin/day after being taken off of Coumadin, and she has not been doing this.    4. She reports pt has had episodes of blurred vision recently that was "strong enough she couldn't see the stoplight, and she had to stop reading because she  couldn't see."  Pt is uncertain whether it affects one or both eyes but states she thinks that it is due to a cataract in her right eye.  She denies associated nausea, dizziness, or pain to any area.  She has an appointment with an ophthalmologist in July.  5. DM - Pt takes 16 units of insulin.  Daughter reports her sugar has generally been in the 90s.  However she is concerned that pt has been eating more sweets recently because "her sugar's good."  She is concerned that this may be affecting pt's wounds and vision.    Patient Active Problem List   Diagnosis Date Noted   Atherosclerotic PVD with ulceration 04/13/2013   Critical lower limb ischemia 04/04/2013   Claudication 04/03/2013   UTI (lower urinary tract infection) 01/25/2013   Urosepsis 01/25/2013   Long term (current) use of anticoagulants 05/12/2012   PAD (peripheral artery disease) 03/03/2012   Liver cyst 02/26/2012   Cyst, kidney, acquired 02/26/2012   Kidney stone 02/26/2012   Chronic kidney disease, stage 3 11/19/2011   COPD (chronic obstructive pulmonary disease) 03/18/2011   Osteoporosis, unspecified 12/17/2010   Type II or unspecified type diabetes mellitus without mention of complication, not stated as uncontrolled  PVD (peripheral vascular disease)    Mixed hyperlipidemia    Renal artery stenosis    Memory loss, short term    Pulmonary nodule    CAD (coronary artery disease)    CHF (congestive heart failure)    Anemia     Past Medical History  Diagnosis Date   PVD (peripheral vascular disease)     a. PTA & stent right superficial artery PTA & left common iliac artery stenting. Right fem-pop bypass graft. b. Per Dr. Kennon Holter note: h/o aortobifemoral bypass grafting remotely as well as fem-peroneal bypass grafting.   Mixed hyperlipidemia    Compression fx, thoracic spine     T12   Ischium fracture    Memory loss, short term    Pulmonary nodule    CAD (coronary artery disease)  10/12/2008    a. 30% LAD,30% CX & 40% tandem mid stenosis in the AV groove, 50-60% RCA   Chronic diastolic CHF (congestive heart failure) 09-2008    a. EF 40% by cath 2010. b. Improved - last echo 2015 with normal EF.   Anemia    Detached retina 02-2005    R EYE   Fracture 02-2007    L5 AND PELVIS   Left ventricular dysfunction     a. EF 40% by cath 2010. b. Improved - last echo 2015 with normal EF.    AAA (abdominal aortic aneurysm)     Repair unknown date   Critical lower limb ischemia    Chronic respiratory failure     a. On home O2.   Hypertension    Warfarin anticoagulation     DC summary from 2010 reports she is "on chronic  Coumadin for vascular disease." This is listed on her med list going back to 2006 by records available. Teaching service H&P remotely says she has h/o DVT but I cannot find any evidence of this. Admission H/P 03/2013 suggests history of atrial fib but all ECGs appear sinus and there is no mention of this previously.   COPD (chronic obstructive pulmonary disease)    Renal artery stenosis     a. 1-59% by doppler in 2014.   Type II or unspecified type diabetes mellitus without mention of complication, not stated as uncontrolled     Past Surgical History  Procedure Laterality Date   Aorta surgery      aortic anyuersm surgery.    Vessel surgery      S/P aortobifemoral bypass grafting and right fem-peroneal bypass grafting remotely   Cholecystectomy     Cataract surgery  06-2006    R EYE   Vascular surgery     Cardiac catheterization     Eye surgery      detached retina  (r)   Eye surgery      cataract  OD   Fracture surgery      sternum  2008   Patch angioplasty Left 04/19/2013    Procedure: VEIN PATCH ANGIOPLASTY - LEFT COMMON FEMORAL ARTERY;  Surgeon: Angelia Mould, MD;  Location: [place];  Service: Vascular;  Laterality: Left;   Femoral-popliteal bypass graft Left 04/19/2013    Procedure: BYPASS GRAFT FEMORAL- BELOW KNEE  POPLITEAL ARTERY WITH VEIN;  Surgeon: Angelia Mould, MD;  Location: [place];  Service: Vascular;  Laterality: Left;    Prior to Admission medications   Medication Sig Start Date End Date Taking? Authorizing Provider  acetaminophen (TYLENOL) 500 MG tablet Take 1,000 mg by mouth every 4 (four) hours as needed for  moderate pain.     Historical Provider, MD  albuterol (PROVENTIL) (2.5 MG/3ML) 0.083% nebulizer solution Take 2.5 mg by nebulization 3 (three) times daily as needed for wheezing or shortness of breath.     Historical Provider, MD  aspirin EC 325 MG EC tablet Take 1 tablet (325 mg total) by mouth daily. 04/05/13   Dayna N Dunn, PA-C  budesonide-formoterol (SYMBICORT) 160-4.5 MCG/ACT inhaler Inhale 2 puffs into the lungs 2 (two) times daily. 08/31/12   Darlyne Russian, MD  CRESTOR 20 MG tablet TAKE 1 TABLET DAILY (PATIENT NEEDS OFFICE VISIT/LABS FOR ADDITIONAL REFILLS) 05/21/13   Mancel Bale, PA-C  esomeprazole (NEXIUM) 40 MG capsule Take 40 mg by mouth daily before breakfast. 08/31/12   Darlyne Russian, MD  furosemide (LASIX) 40 MG tablet Take 40 mg by mouth daily. 02/14/13   Darlyne Russian, MD  HYDROcodone-acetaminophen [employer]) 7.5-325 MG per tablet Take one tablet by mouth every four hours as needed for pain 04/25/13   Tiffany L Reed, DO  ibandronate (BONIVA) 150 MG tablet Take 150 mg by mouth every 30 (thirty) days. Take in the morning with a full glass of water, on an empty stomach, and do not take anything else by mouth or lie down for the next 30 min. (take on the 25th of each month)    Historical Provider, MD  Insulin Glargine (LANTUS) 100 UNIT/ML Solostar Pen Inject 16 Units into the skin daily at 10 pm.    Historical Provider, MD  Ipratropium-Albuterol (COMBIVENT RESPIMAT) 20-100 MCG/ACT AERS respimat Inhale 2 puffs into the lungs every 6 (six) hours as needed for wheezing or shortness of breath.     Historical Provider, MD  losartan (COZAAR) 100 MG tablet Take 100 mg by mouth daily.  08/31/12   Darlyne Russian, MD  metoprolol succinate (TOPROL-XL) 25 MG 24 hr tablet Take 25 mg by mouth daily.     Historical Provider, MD  Omega-3 Fatty Acids (FISH OIL) 1200 MG CAPS Take 1,200 mg by mouth 2 (two) times daily. 02/14/13   Darlyne Russian, MD  ranitidine (ZANTAC) 150 MG tablet Take 150 mg by mouth 2 (two) times daily. 08/31/12 08/31/13  Darlyne Russian, MD  rosuvastatin (CRESTOR) 20 MG tablet Take 20 mg by mouth daily. PATIENT NEEDS OFFICE VISIT/LABS FOR ADDITIONAL REFILLS 03/10/13   Darlyne Russian, MD    History   Social History   Marital Status: Widowed    Spouse Name: N/A    Number of Children: N/A   Years of Education: N/A   Occupational History   Not on file.   Social History Main Topics   Smoking status: Former Smoker    Types: Cigarettes    Quit date: 02/16/1997   Smokeless tobacco: Never Used   Alcohol Use: No   Drug Use: No   Sexual Activity: No   Other Topics Concern   Not on file   Social History Narrative   Lives by self, cares for self, drives.       Review of Systems  Eyes: Positive for visual disturbance.  Cardiovascular: Negative for chest pain.  Gastrointestinal: Negative for nausea.  Skin: Positive for wound.  Neurological: Negative for dizziness.         Objective:   Physical Exam CONSTITUTIONAL: Well developed/well nourished HEAD: Normocephalic/atraumatic EYES: EOMI/PERRL ENMT: Mucous membranes moist NECK: supple no meningeal signs SPINE:entire spine nontender CV: Loud right carotid bruit, 2/6 murmur at the base of the heart.  S1/S2 noted,  no rubs/gallops noted LUNGS: Lungs are clear to auscultation bilaterally, no apparent distress ABDOMEN: soft, nontender, no rebound or guarding GU:no cva tenderness NEURO: Pt is awake/alert, cooperative, moves all extremitiesx4 EXTREMITIES: 1x1-cm eschar over the tip of the left great toe.  Decreased sensation in feet. SKIN: warm, color normal PSYCH: no abnormalities of mood  noted Results for orders placed in visit on 06/07/13  GLUCOSE, POCT (MANUAL RESULT ENTRY)      Result Value Ref Range   POC Glucose 118 (*) 70 - 99 mg/dl     BP 147/63   Pulse 65   Temp(Src) 97.3 F (36.3 C)   Resp 16   Ht 5' 2.5" (1.588 m)   Wt 140 lb (63.504 kg)   BMI 25.18 kg/m2   SpO2 93%       Assessment & Plan:   Sugars have been on the low end. I decreased her insulin to 14 units and told her daughter to decrease to 12 units if she continues to run low. I advised her to wear her oxygen when she goes to swim class and also to wear her oxygen at night. She has had an issue with vision in her left eye. This certainly could be coming from her carotid disease. She was also told she had to take an aspirin every day as her blood thinner. She is going to follow up with her eye doctor and will continue her followups with her vascular surgeon and with Dr. Gwenlyn Found .   I personally performed the services described in this documentation, which was scribed in my presence. The recorded information has been reviewed and is accurate.

## 2013-06-28 ENCOUNTER — Encounter: Payer: Self-pay | Admitting: Vascular Surgery

## 2013-06-29 ENCOUNTER — Encounter: Payer: Self-pay | Admitting: Vascular Surgery

## 2013-06-29 ENCOUNTER — Ambulatory Visit (INDEPENDENT_AMBULATORY_CARE_PROVIDER_SITE_OTHER): Payer: Self-pay | Admitting: Vascular Surgery

## 2013-06-29 VITALS — BP 143/53 | HR 75 | Resp 14 | Ht 62.0 in | Wt 140.0 lb

## 2013-06-29 DIAGNOSIS — I70209 Unspecified atherosclerosis of native arteries of extremities, unspecified extremity: Secondary | ICD-10-CM

## 2013-06-29 DIAGNOSIS — Z48812 Encounter for surgical aftercare following surgery on the circulatory system: Secondary | ICD-10-CM

## 2013-06-29 DIAGNOSIS — I739 Peripheral vascular disease, unspecified: Secondary | ICD-10-CM

## 2013-06-29 DIAGNOSIS — L98499 Non-pressure chronic ulcer of skin of other sites with unspecified severity: Secondary | ICD-10-CM

## 2013-06-29 NOTE — Progress Notes (Signed)
    Postoperative Visit   History of Present Illness  Suzanne [name] is a 80 y.o. year old female who presents for postoperative follow-up for: left fem-pop by-pass (Date: 04/19/2013).  The patient's wound on her left great toe is healing.  The patient notes  resolution of lower extremity symptoms.  The patient is  able to complete their activities of daily living.  She is living at home independently.  For VQI Use Only  PRE-ADM LIVING: Home  AMB STATUS: Ambulatory with a straight cane  Physical Examination  Filed Vitals:   06/29/13 1447  BP: 143/53  Pulse: 75  Resp: 14   Left LE: Incisions are  healed, left great toe wound is healing, pedal pulses are not palpable, but doppler signals are present in DP/PT/Peroneal.  Medical Decision Making  Suzanne [name] is a 80 y.o. year old female who presents s/p Left fem-pop 2 month ago.  The patient's bypass incisions are well healed appropriately with resolution of pre-operative symptoms.   The patient's surveillance will included Bilateral ABI's  studies which will be completed in: 6 months, at which time the patient will be re-evaluated. She will wash her left great toe with soap and water daily.   She was seen ion conjunction with Dr. Vanita Ingles PA-C [facility]: [phone]   06/29/2013, 3:11 PM  Agree with above. Her bypass graft is functioning well. Dr. Gwenlyn Found would like her follow up studies done at their office so we will try to arrange that in 6 months.  [name], MD, [place] [phone] 06/29/2013

## 2013-06-30 ENCOUNTER — Telehealth: Payer: Self-pay | Admitting: *Deleted

## 2013-06-30 NOTE — Telephone Encounter (Signed)
Faxed signed orders, per Dr Joseph Art. Confirmation page received at 8:14 am.

## 2013-07-01 ENCOUNTER — Telehealth: Payer: Self-pay | Admitting: Vascular Surgery

## 2013-07-01 NOTE — Telephone Encounter (Signed)
I spoke with Charlena Cross at Dr Rachel Bo office to get patient scheduled for 6 month f/u with ABIs as requested by Dr Berry/Dr Scot Dock. Charlena Cross said that they would contact patient later to schedule. She also said that they would enter their own order, as she could not schedule from a hospital order.

## 2013-07-01 NOTE — Telephone Encounter (Signed)
Message copied by Gena Fray on Fri Jul 01, 2013 10:33 AM ------      Message from: Angelia Mould      Created: Wed Jun 29, 2013  5:02 PM      Regarding: lab f/u       Dr. Quay Burow had referred this patient. She was seen today and arrangements made for follow up studies in our office in 6 months. However Dr. Gwenlyn Found has notified me that he would like follow up studies done in their office. Can we please cancel her follow up studies here and have arranged to be done at his office. Thank you.      CD ------

## 2013-07-06 ENCOUNTER — Other Ambulatory Visit: Payer: Self-pay

## 2013-07-06 DIAGNOSIS — Z1231 Encounter for screening mammogram for malignant neoplasm of breast: Secondary | ICD-10-CM

## 2013-07-14 ENCOUNTER — Telehealth (HOSPITAL_COMMUNITY): Payer: Self-pay | Admitting: *Deleted

## 2013-07-14 NOTE — Telephone Encounter (Signed)
RN returned call to patient. She is c/o of her right leg aching "like a toothache" and her leg is cold. It has been going on for about 1 week and has worsened. She states the pain is worse when she walks.    History (per recent hospital discharge note) repair of an infrarenal abdominal aortic aneurysm with an aortobifemoral bypass graft by Dr. Amedeo Plenty in 1998; multiple revascularization attempts in the right leg; mild claudication bilaterally but however her activity is fairly limited by her pulmonary status  Hospital Course:  The patient was admitted to the hospital and taken to the operating room on 04/19/2013 and underwent:  1. Endarterectomy of left common femoral artery with bovine pericardial patch angioplasty of distal left limb of aortofemoral bypass graft  2. Left femoral to below knee popliteal artery bypass with non-reversed translocated saphenous vein graft  Will defer to Dr. Reinaldo Meeker, NP to advise on ordering LE arterial doppler study. (last bilateral doppler in 03/2013) She is due for a 6 month OV with Dr. Gwenlyn Found (recall was for 04/2013) but she did not make appmt since she was seeing Dr. Scot Dock for her leg/hospitalization

## 2013-07-14 NOTE — Telephone Encounter (Signed)
Pt is complaining that her rt is cold and in pain. She would like to know if she could have a doppler test today or tomorrow

## 2013-07-15 ENCOUNTER — Encounter: Payer: Self-pay | Admitting: Family

## 2013-07-15 ENCOUNTER — Ambulatory Visit (INDEPENDENT_AMBULATORY_CARE_PROVIDER_SITE_OTHER): Payer: Self-pay | Admitting: Family

## 2013-07-15 ENCOUNTER — Other Ambulatory Visit: Payer: Self-pay | Admitting: *Deleted

## 2013-07-15 ENCOUNTER — Telehealth: Payer: Self-pay

## 2013-07-15 ENCOUNTER — Ambulatory Visit (HOSPITAL_COMMUNITY)
Admission: RE | Admit: 2013-07-15 | Discharge: 2013-07-15 | Disposition: A | Discharge status: Home / Self Care | Payer: Medicare Other | Source: Ambulatory Visit | Attending: Family | Admitting: Family

## 2013-07-15 VITALS — BP 137/69 | HR 63 | Resp 14 | Ht 62.5 in | Wt 136.0 lb

## 2013-07-15 DIAGNOSIS — I739 Peripheral vascular disease, unspecified: Secondary | ICD-10-CM | POA: Insufficient documentation

## 2013-07-15 DIAGNOSIS — M79609 Pain in unspecified limb: Secondary | ICD-10-CM

## 2013-07-15 DIAGNOSIS — R6889 Other general symptoms and signs: Secondary | ICD-10-CM

## 2013-07-15 DIAGNOSIS — L819 Disorder of pigmentation, unspecified: Secondary | ICD-10-CM

## 2013-07-15 MED ORDER — HYDROCODONE-ACETAMINOPHEN 7.5-325 MG PO TABS: ORAL_TABLET | ORAL | Status: DC

## 2013-07-15 NOTE — Telephone Encounter (Signed)
Have her seen by [place] or Dr. Scot Dock in next few days.

## 2013-07-15 NOTE — Telephone Encounter (Signed)
Pt. Called and an appt will be set up sometime next week with luke to evaluate

## 2013-07-15 NOTE — Telephone Encounter (Signed)
Phone call from pt.  Reported 3 day hx of increased pain in the right leg when taking a few steps.  Reported the pain worsens the farther she walks.  C/o right lower leg feeling colder than the left, from the knee and down to the foot.  Stated there is swelling in the right lower leg, and states "but it is always swollen".  Denies any open sores.  Denies rest pain.  Also stated that her graft "feels different"; "I used to be able to feel it, but now it isn't there".  Notified Dr. Bridgett Larsson.  Rec'd v.o. to schedule Right LE Art. Duplex to check BPG.    Advised pt. to come to office at 3:00 PM for evaluation.  Agrees with plan.

## 2013-07-15 NOTE — Progress Notes (Addendum)
[facility] [place] HISTORY AND PHYSICAL -PAD  History of Present Illness Suzanne [name] is a 80 y.o. female patient of Dr. Scot Dock who is s/p left fem-pop by-pass (Date: 04/19/2013). Right femoral to proximal anterior tibial artery bypass graft in 2009 with revascularizations; History of aorto-bifemoral bypass graft. The patient's wound on her left great toe is healing. The patient notes resolution of lower extremity symptoms. The patient is able to complete their activities of daily living. She is living at home independently.  Pt saw Dr. Scot Dock on 06/29/13; about a week to 10 days ago her right foot and lower leg turned cold and blue, not getting worse in the last few days, muscle spasms, c/o pain, described as like a toothache, in right lateral calf with walking only, denies non healing wounds. She sometimes gets knife like pain in her right leg. Norco helped her pain last month, no adverse effects; daughter states Miralax was given to prevent constipation. Coumadin was stopped Feb. 15 by Dr. Gwenlyn Found, per patient's daughter, was initiated by Dr. Lenard Simmer in 1999 for PAD.  She now takes 81 mg daily ASA. She is able to walk and has active full range of motion in right ankle and knee. She has sensation to light touch in right foot and leg.  Pt Diabetic: Yes Pt smoker: former smoker, quit in 1998  Pt meds include: Statin :Yes Betablocker: Yes ASA: Yes Other anticoagulants/antiplatelets: no  Past Medical History  Diagnosis Date  . PVD (peripheral vascular disease)     a. PTA & stent right superficial artery PTA & left common iliac artery stenting. Right fem-pop bypass graft. b. Per Dr. Kennon Holter note: h/o aortobifemoral bypass grafting remotely as well as fem-peroneal bypass grafting.  . Mixed hyperlipidemia   . Compression fx, thoracic spine     T12  . Ischium fracture   . Memory loss, short term   . Pulmonary nodule   . CAD (coronary artery disease) 10/12/2008    a. 30%  LAD,30% CX & 40% tandem mid stenosis in the AV groove, 50-60% RCA  . Chronic diastolic CHF (congestive heart failure) 09-2008    a. EF 40% by cath 2010. b. Improved - last echo 2015 with normal EF.  [name] Anemia   . Detached retina 02-2005    R EYE  . Fracture 02-2007    L5 AND PELVIS  . Left ventricular dysfunction     a. EF 40% by cath 2010. b. Improved - last echo 2015 with normal EF.   [name] AAA (abdominal aortic aneurysm)     Repair unknown date  . Critical lower limb ischemia   . Chronic respiratory failure     a. On home O2.  [name] Hypertension   . Warfarin anticoagulation     DC summary from 2010 reports she is "on chronic  Coumadin for vascular disease." This is listed on her med list going back to 2006 by records available. Teaching service H&P remotely says she has h/o DVT but I cannot find any evidence of this. Admission H/P 03/2013 suggests history of atrial fib but all ECGs appear sinus and there is no mention of this previously.  [name] COPD (chronic obstructive pulmonary disease)   . Renal artery stenosis     a. 1-59% by doppler in 2014.  . Type II or unspecified type diabetes mellitus without mention of complication, not stated as uncontrolled     Social History History  Substance Use Topics  . Smoking status: Former  Smoker    Types: Cigarettes    Quit date: 02/16/1997  . Smokeless tobacco: Never Used  . Alcohol Use: No    Family History Family History  Problem Relation Age of Onset  . Tuberculosis Mother   . Cancer Father     Past Surgical History  Procedure Laterality Date  . Aorta surgery      aortic anyuersm surgery.   . Vessel surgery      S/P aortobifemoral bypass grafting and right fem-peroneal bypass grafting remotely  . Cholecystectomy    . Cataract surgery  06-2006    R EYE  . Vascular surgery    . Cardiac catheterization    . Eye surgery      detached retina  (r)  . Eye surgery      cataract  OD  . Fracture surgery      sternum  2008  . Patch  angioplasty Left 04/19/2013    Procedure: VEIN PATCH ANGIOPLASTY - LEFT COMMON FEMORAL ARTERY;  Surgeon: Angelia Mould, MD;  Location: [facility];  Service: Vascular;  Laterality: Left;  . Femoral-popliteal bypass graft Left 04/19/2013    Procedure: BYPASS GRAFT FEMORAL- BELOW KNEE POPLITEAL ARTERY WITH VEIN;  Surgeon: Angelia Mould, MD;  Location: [facility];  Service: Vascular;  Laterality: Left;    Allergies  Allergen Reactions  . Codeine Other (See Comments)    REACTION: hallucinations  . Hydrochlorothiazide Other (See Comments)    REACTION: hypotension  . Pregabalin Other (See Comments)    REACTION: unknown  . Other Rash    Adhesives and bandages gives pt a rash.    Current Outpatient Prescriptions  Medication Sig Dispense Refill  . acetaminophen (TYLENOL) 500 MG tablet Take 1,000 mg by mouth every 4 (four) hours as needed for moderate pain.       [name] albuterol (PROVENTIL) (2.5 MG/3ML) 0.083% nebulizer solution Take 2.5 mg by nebulization 3 (three) times daily as needed for wheezing or shortness of breath.       [name] aspirin EC 325 MG EC tablet Take 1 tablet (325 mg total) by mouth daily.      . budesonide-formoterol (SYMBICORT) 160-4.5 MCG/ACT inhaler Inhale 2 puffs into the lungs 2 (two) times daily.      . CRESTOR 20 MG tablet TAKE 1 TABLET DAILY (PATIENT NEEDS OFFICE VISIT/LABS FOR ADDITIONAL REFILLS)  30 tablet  0  . esomeprazole (NEXIUM) 40 MG capsule Take 40 mg by mouth daily before breakfast.      . furosemide (LASIX) 40 MG tablet Take 40 mg by mouth daily.      [name] HYDROcodone-acetaminophen (NORCO) 7.5-325 MG per tablet Take one half tablet as needed for pain every 6 hours  30 tablet  0  . ibandronate (BONIVA) 150 MG tablet Take 150 mg by mouth every 30 (thirty) days. Take in the morning with a full glass of water, on an empty stomach, and do not take anything else by mouth or lie down for the next 30 min. (take on the 25th of each month)      . Insulin Glargine (LANTUS) 100  UNIT/ML Solostar Pen Inject 14 Units into the skin daily at 10 pm.       . Ipratropium-Albuterol (COMBIVENT RESPIMAT) 20-100 MCG/ACT AERS respimat Inhale 2 puffs into the lungs every 6 (six) hours as needed for wheezing or shortness of breath.       . losartan (COZAAR) 100 MG tablet Take 100 mg by mouth daily.      [name]  metoprolol succinate (TOPROL-XL) 25 MG 24 hr tablet Take 25 mg by mouth daily.       . Omega-3 Fatty Acids (FISH OIL) 1200 MG CAPS Take 1,200 mg by mouth 2 (two) times daily.      . ranitidine (ZANTAC) 150 MG tablet Take 150 mg by mouth 2 (two) times daily.      . rosuvastatin (CRESTOR) 20 MG tablet Take 20 mg by mouth daily. PATIENT NEEDS OFFICE VISIT/LABS FOR ADDITIONAL REFILLS       No current facility-administered medications for this visit.    ROS: See HPI for pertinent positives and negatives.   Physical Examination   Filed Vitals:   07/15/13 1549  BP: 137/69  Pulse: 63  Resp: 14  Height: 5' 2.5" (1.588 m)  Weight: 136 lb (61.689 kg)  SpO2: 98%   Body mass index is 24.46 kg/(m^2).  General: A&O x 3, WDWN. Gait: limp Eyes: PERRLA. Pulmonary: CTAB, without wheezes , rales or rhonchi. Cardiac: regular Rythm , without detected murmur.         Carotid Bruits Left Right   Negative Positive  Aorta is not palpable. Radial pulses: are 2+ palpable and =.                           VASCULAR EXAM: Extremities with ischemic changes right foot is cyanotic at the sole and toes, right lower leg is less cyanotic above ankle and below knee without Gangrene; without open wounds.                                                                                                          LE Pulses LEFT RIGHT       FEMORAL  4+ palpable  2+ palpable        POPLITEAL  4+ palpable   not palpable, monophasic by Doppler       POSTERIOR TIBIAL  not palpable, monophasic by Doppler   not palpable, non-Dopplerable        DORSALIS PEDIS      ANTERIOR TIBIAL 2+ palpable  Not  palpable, non-Dopplerable        PERONEAL Not palpable, monophasic by Doppler   Not palpable, non-Dopplerable    Abdomen: soft, NT, no masses. Skin: no rashes, no ulcers noted. Musculoskeletal: no muscle wasting or atrophy.  Neurologic: A&O X 3; Appropriate Affect ; SENSATION: normal; MOTOR FUNCTION:  moving all extremities equally, motor strength 3/5 throughout. Speech is fluent/normal. CN 2-12 intact. Apt is able to rotate right ankle through full range of motion and has sensation to light touch in right foot.    Non-Invasive Vascular Imaging: DATE: 07/15/2013 LOWER EXTREMITY ARTERIAL DUPLEX EVALUATION    INDICATION: Follow up bypass graft     PREVIOUS INTERVENTION(S): Right femoral to proximal anterior tibial artery bypass graft in 2009 with revascularizations; Left femoral to below knee popliteal bypass graft on 04/19/13; History of aorto-bifemoral bypass graft    DUPLEX EXAM:     RIGHT  LEFT   Peak Systolic Velocity (  cm/s) Ratio (if abnormal) Waveform  Peak Systolic Velocity (cm/s) Ratio (if abnormal) Waveform  69  T Inflow Artery     No Flow   Proximal Anastomosis     No Flow   Proximal Graft     No Flow   Mid Graft     No Flow    Distal Graft     No Flow   Distal Anastomosis     Not Visualized    Outflow Artery      Today's ABI / TBI   0.62 Previous ABI / TBI (04/21/13 at [facility] ) 2.2    Waveform:    M - Monophasic       B - Biphasic       T - Triphasic  If Ankle Brachial Index (ABI) or Toe Brachial Index (TBI) performed, please see complete report     ADDITIONAL FINDINGS:   Unable to adequately visualize right tibial vessel flow due to vessel calcification.   Patent right limb of the aorto-bifemoral bypass graft, based on limited evaluation.    IMPRESSION: Evidence suggestive of a total-occlusion of the right leg bypass graft noted.    Compared to the previous exam:  The graft appears to now be occluded when compared to the exam on 08/31/09.      ASSESSMENT: Suzanne [name] is a 80 y.o. female who is s/p Right femoral to proximal anterior tibial artery bypass graft in 2009 with revascularizations; Left femoral to below knee popliteal bypass graft on 04/19/13; History of aorto-bifemoral bypass graft. She has had 7 revascularization procedures in her right leg, the final of which is a redo of a right femoral to below the knee bypass graft. Options have been exhausted re revascularization of right leg.  PLAN:  Based on the patient's vascular studies and examination, and after discussing with Dr. Bridgett Larsson, pt will return to clinic in 5 days with Dr. Scot Dock to discuss options.  Patient and daughter were advised to go to an ED should her foot or leg become too painful or her foot or leg becomes gangrenous. Reodordered hydrocdone-acetaminophen 7.5-325, 1/2 tablet every 6 hours prn pain, disp #15, 0 refills.  The patient was given information about PAD including signs, symptoms, treatment, what symptoms should prompt the patient to seek immediate medical care, and risk reduction measures to take.  Clemon Chambers, RN, MSN, FNP-C [facility] Phone: [phone]  Clinic MD: Bridgett Larsson on call  07/15/2013 4:05 PM

## 2013-07-18 ENCOUNTER — Telehealth: Payer: Self-pay | Admitting: Cardiovascular Disease

## 2013-07-18 NOTE — Telephone Encounter (Signed)
Called to schedule appointment for pt to be seen pr Suzanne [name]. Tressie Stalker, LPN.  Patient states that she was able to get in to see her surgeon and does not need the appointment with Dr. Gwenlyn Found.

## 2013-07-19 ENCOUNTER — Encounter: Payer: Self-pay | Admitting: Vascular Surgery

## 2013-07-20 ENCOUNTER — Ambulatory Visit (INDEPENDENT_AMBULATORY_CARE_PROVIDER_SITE_OTHER): Payer: Self-pay | Admitting: Vascular Surgery

## 2013-07-20 ENCOUNTER — Encounter: Payer: Self-pay | Admitting: Vascular Surgery

## 2013-07-20 VITALS — BP 139/48 | HR 63 | Ht 62.5 in | Wt 135.0 lb

## 2013-07-20 DIAGNOSIS — I739 Peripheral vascular disease, unspecified: Secondary | ICD-10-CM

## 2013-07-20 NOTE — Progress Notes (Signed)
[facility] [place]  Patient name: Suzanne [name] MRN: [id number] DOB: 05/02/1921 Sex: female  REASON FOR VISIT: Follow up of peripheral vascular disease  HPI: Suzanne [name] is a 80 y.o. female who was last seen in our office on 07/15/2013 by Clemon Chambers, NP. with an occluded right femoral to anterior tibial artery bypass graft which was done in 2009 with a prosthetic graft by Dr. Drucie Opitz. Most recently, on 04/19/2013, I performed endarterectomy of the left common femoral artery with bovine paracardial patch angioplasty of the distal limb of her aortofemoral bypass graft and a left femoral to below knee popliteal artery bypass with a vein graft. She has also had a previous aortobifemoral bypass graft. Duplex scan at her last visit showed that her bypass graft on the right was occluded.  It was not totally clear when it occluded but likely a couple weeks ago.  Arrangements were made for her to follow-up with me to discuss her options on the right leg. She does have pain in her right leg ambulation but interestingly, denies any rest pain. At the time of her last visit she states they were unable to obtain any Doppler signals in the right foot. Her most recent bypass graft on the left remains patent and she has no symptoms in the left lower extremity.   Past Medical History  Diagnosis Date  . PVD (peripheral vascular disease)     a. PTA & stent right superficial artery PTA & left common iliac artery stenting. Right fem-pop bypass graft. b. Per Dr. Kennon Holter note: h/o aortobifemoral bypass grafting remotely as well as fem-peroneal bypass grafting.  . Mixed hyperlipidemia   . Compression fx, thoracic spine     T12  . Ischium fracture   . Memory loss, short term   . Pulmonary nodule   . CAD (coronary artery disease) 10/12/2008    a. 30% LAD,30% CX & 40% tandem mid stenosis in the AV groove, 50-60% RCA  . Chronic diastolic CHF (congestive heart failure) 09-2008    a.  EF 40% by cath 2010. b. Improved - last echo 2015 with normal EF.  [name] Anemia   . Detached retina 02-2005    R EYE  . Fracture 02-2007    L5 AND PELVIS  . Left ventricular dysfunction     a. EF 40% by cath 2010. b. Improved - last echo 2015 with normal EF.   [name] AAA (abdominal aortic aneurysm)     Repair unknown date  . Critical lower limb ischemia   . Chronic respiratory failure     a. On home O2.  [name] Hypertension   . Warfarin anticoagulation     DC summary from 2010 reports she is "on chronic  Coumadin for vascular disease." This is listed on her med list going back to 2006 by records available. Teaching service H&P remotely says she has h/o DVT but I cannot find any evidence of this. Admission H/P 03/2013 suggests history of atrial fib but all ECGs appear sinus and there is no mention of this previously.  [name] COPD (chronic obstructive pulmonary disease)   . Renal artery stenosis     a. 1-59% by doppler in 2014.  . Type II or unspecified type diabetes mellitus without mention of complication, not stated as uncontrolled    Family History  Problem Relation Age of Onset  . Tuberculosis Mother   . Cancer Father    SOCIAL HISTORY: History  Substance Use Topics  .  Smoking status: Former Smoker    Types: Cigarettes    Quit date: 02/16/1997  . Smokeless tobacco: Never Used  . Alcohol Use: No   Allergies  Allergen Reactions  . Codeine Other (See Comments)    REACTION: hallucinations  . Hydrochlorothiazide Other (See Comments)    REACTION: hypotension  . Pregabalin Other (See Comments)    REACTION: unknown  . Other Rash    Adhesives and bandages gives pt a rash.   Current Outpatient Prescriptions  Medication Sig Dispense Refill  . acetaminophen (TYLENOL) 500 MG tablet Take 1,000 mg by mouth every 4 (four) hours as needed for moderate pain.       [name] albuterol (PROVENTIL) (2.5 MG/3ML) 0.083% nebulizer solution Take 2.5 mg by nebulization 3 (three) times daily as needed for wheezing or  shortness of breath.       [name] aspirin EC 325 MG EC tablet Take 1 tablet (325 mg total) by mouth daily.      . budesonide-formoterol (SYMBICORT) 160-4.5 MCG/ACT inhaler Inhale 2 puffs into the lungs 2 (two) times daily.      . CRESTOR 20 MG tablet TAKE 1 TABLET DAILY (PATIENT NEEDS OFFICE VISIT/LABS FOR ADDITIONAL REFILLS)  30 tablet  0  . esomeprazole (NEXIUM) 40 MG capsule Take 40 mg by mouth daily before breakfast.      . furosemide (LASIX) 40 MG tablet Take 40 mg by mouth daily.      [name] HYDROcodone-acetaminophen (NORCO) 7.5-325 MG per tablet Take one half tablet as needed for pain every 6 hours  15 tablet  0  . ibandronate (BONIVA) 150 MG tablet Take 150 mg by mouth every 30 (thirty) days. Take in the morning with a full glass of water, on an empty stomach, and do not take anything else by mouth or lie down for the next 30 min. (take on the 25th of each month)      . Insulin Glargine (LANTUS) 100 UNIT/ML Solostar Pen Inject 14 Units into the skin daily at 10 pm.       . Ipratropium-Albuterol (COMBIVENT RESPIMAT) 20-100 MCG/ACT AERS respimat Inhale 2 puffs into the lungs every 6 (six) hours as needed for wheezing or shortness of breath.       . losartan (COZAAR) 100 MG tablet Take 100 mg by mouth daily.      . metoprolol succinate (TOPROL-XL) 25 MG 24 hr tablet Take 25 mg by mouth daily.       . Omega-3 Fatty Acids (FISH OIL) 1200 MG CAPS Take 1,200 mg by mouth 2 (two) times daily.      . ranitidine (ZANTAC) 150 MG tablet Take 150 mg by mouth 2 (two) times daily.      . rosuvastatin (CRESTOR) 20 MG tablet Take 20 mg by mouth daily. PATIENT NEEDS OFFICE VISIT/LABS FOR ADDITIONAL REFILLS       No current facility-administered medications for this visit.   REVIEW OF SYSTEMS: Valu.Nieves ] denotes positive finding; [  ] denotes negative finding  CARDIOVASCULAR:  [ ]  chest pain   [ ]  chest pressure   [ ]  palpitations   [ ]  orthopnea   [ ]  dyspnea on exertion   Valu.Nieves ] claudication   [ ]  rest pain   [ ]  DVT   [ ]   phlebitis PULMONARY:   [ ]  productive cough   [ ]  asthma   [ ]  wheezing NEUROLOGIC:   [ ]  weakness  [ ]  paresthesias  [ ]  aphasia  [ ]   amaurosis  [ ]  dizziness HEMATOLOGIC:   [ ]  bleeding problems   [ ]  clotting disorders MUSCULOSKELETAL:  Valu.Nieves ] joint pain   [ ]  joint swelling [ ]  leg swelling GASTROINTESTINAL: [ ]   blood in stool  [ ]   hematemesis GENITOURINARY:  [ ]   dysuria  [ ]   hematuria PSYCHIATRIC:  [ ]  history of major depression INTEGUMENTARY:  [ ]  rashes  [ ]  ulcers CONSTITUTIONAL:  [ ]  fever   [ ]  chills  PHYSICAL EXAM: Filed Vitals:   07/20/13 0921  BP: 139/48  Pulse: 63  Height: 5' 2.5" (1.588 m)  Weight: 135 lb (61.236 kg)  SpO2: 98%   Body mass index is 24.28 kg/(m^2). GENERAL: The patient is a well-nourished female, in no acute distress. The vital signs are documented above. CARDIOVASCULAR: There is a regular rate and rhythm. She has bilateral carotid bruits. She has palpable femoral pulses are palpable left popliteal pulse. Today, I was able to obtain an anterior tibial signal, a peroneal signal, and a barely audible dorsalis pedis signal in the right foot. PULMONARY: There is good air exchange bilaterally without wheezing or rales. ABDOMEN: Soft and non-tender with normal pitched bowel sounds.  MUSCULOSKELETAL: There are no major deformities or cyanosis. NEUROLOGIC: No focal weakness or paresthesias are detected. SKIN: she has chronic ischemic changes of the right foot. PSYCHIATRIC: The patient has a normal affect.  MEDICAL ISSUES: The patient has an occluded right femoral to anterior tibial artery bypass graft which was done with a prosthetic graft back in 2009. Unfortunately she is now approximately 2 weeks out and I do not think she is a good candidate for thrombectomy of the graft for thrombolysis. Currently the foot is viable and she only has pain with ambulation. She denies rest pain. I've explained that the options are to try to ride this out in hopes that this  will gradually improve. It is encouraging that we are now able to obtain some Doppler flow in the right foot. The other option would be to have Dr. Quay Burow performed an arteriogram to see if there are any options in the right leg for a redo bypass. Unfortunately, she likely does not have any vein for an autogenous bypass and a femoral to tibial artery bypass with prosthetic graft would have only limited chance for success and would be associated with significant risk given her age. She is comfortable trying a conservative approach for now. I'll plan on seeing her back in 3 months. She knows to call sooner she has problems.   Return in about 3 months (around 10/20/2013).  Angelia Mould [facility] [place] Beeper: [phone]

## 2013-07-28 ENCOUNTER — Ambulatory Visit: Payer: Medicare Other | Admitting: Cardiology

## 2013-08-04 ENCOUNTER — Ambulatory Visit (INDEPENDENT_AMBULATORY_CARE_PROVIDER_SITE_OTHER): Payer: Medicare Other | Admitting: Emergency Medicine

## 2013-08-04 ENCOUNTER — Encounter: Payer: Self-pay | Admitting: Emergency Medicine

## 2013-08-04 VITALS — BP 102/55 | HR 62 | Temp 97.7°F | Resp 16 | Wt 134.0 lb

## 2013-08-04 DIAGNOSIS — I739 Peripheral vascular disease, unspecified: Secondary | ICD-10-CM

## 2013-08-04 DIAGNOSIS — Z23 Encounter for immunization: Secondary | ICD-10-CM

## 2013-08-04 DIAGNOSIS — I2510 Atherosclerotic heart disease of native coronary artery without angina pectoris: Secondary | ICD-10-CM

## 2013-08-04 DIAGNOSIS — E119 Type 2 diabetes mellitus without complications: Secondary | ICD-10-CM

## 2013-08-04 LAB — BASIC METABOLIC PANEL
BUN: 63 mg/dL — AB (ref 6–23)
CHLORIDE: 105 meq/L (ref 96–112)
CO2: 21 mEq/L (ref 19–32)
CREATININE: 2.67 mg/dL — AB (ref 0.50–1.10)
Calcium: 9.4 mg/dL (ref 8.4–10.5)
Glucose, Bld: 178 mg/dL — ABNORMAL HIGH (ref 70–99)
Potassium: 3.9 mEq/L (ref 3.5–5.3)
Sodium: 141 mEq/L (ref 135–145)

## 2013-08-04 LAB — POCT GLYCOSYLATED HEMOGLOBIN (HGB A1C): Hemoglobin A1C: 6.5

## 2013-08-04 NOTE — Progress Notes (Signed)
Subjective:   This chart was scribed for Darlyne Russian, MD by Forrestine Him, [facility] Scribe. This patient was seen in room 23 and the patient's care was started 11:00 AM.    Patient ID: Suzanne [name], female    DOB: [birth year], 80 y.o.   MRN: [id number]  No chief complaint on file.  HPI  HPI Comments: Suzanne [name] is a 80 y.o. Female with a PMHx of PVD, hyperlipidemia, CAD, CHF, AAA, HTN, COPD, and DM who presents to [facility] for a 2 month follow up today. Pt reports constant, moderate, unchanged pain to her R lower extremity and R foot secondary to claudication from an occulted graft to the R leg. States this pain is exacerbated in the morning after waking from sleep. She has tried OTC Tylenol 500 mg and prescribed Norco 7.5-325 mg alternatively with mild temporary improvement. Blood sugar has been running normal. However, she reports a reading below 90 in the last week. She also states she notes occasional reading above 100. She denies any light-headedness, dizziness, or near syncope. Pt utilizes a walker at all times to help with ambulation. States she is doing well at home. No other concerns this visit.  Patient Active Problem List   Diagnosis Date Noted  . Peripheral vascular disease, unspecified 07/20/2013  . Discoloration of skin-Right leg 07/15/2013  . Cold feeling-Right Leg 07/15/2013  . Pain in limb-Right Leg 07/15/2013  . Atherosclerotic PVD with ulceration 04/13/2013  . Critical lower limb ischemia 04/04/2013  . Claudication 04/03/2013  . UTI (lower urinary tract infection) 01/25/2013  . Urosepsis 01/25/2013  . Long term (current) use of anticoagulants 05/12/2012  . PAD (peripheral artery disease) 03/03/2012  . Liver cyst 02/26/2012  . Cyst, kidney, acquired 02/26/2012  . Kidney stone 02/26/2012  . Chronic kidney disease, stage 3 11/19/2011  . COPD (chronic obstructive pulmonary disease) 03/18/2011  . Osteoporosis,  unspecified 12/17/2010  . Type II or unspecified type diabetes mellitus without mention of complication, not stated as uncontrolled   . PVD (peripheral vascular disease)   . Mixed hyperlipidemia   . Renal artery stenosis   . Memory loss, short term   . Pulmonary nodule   . CAD (coronary artery disease)   . CHF (congestive heart failure)   . Anemia    Past Medical History  Diagnosis Date  . PVD (peripheral vascular disease)     a. PTA & stent right superficial artery PTA & left common iliac artery stenting. Right fem-pop bypass graft. b. Per Dr. Kennon Holter note: h/o aortobifemoral bypass grafting remotely as well as fem-peroneal bypass grafting.  . Mixed hyperlipidemia   . Compression fx, thoracic spine     T12  . Ischium fracture   . Memory loss, short term   . Pulmonary nodule   . CAD (coronary artery disease) 10/12/2008    a. 30% LAD,30% CX & 40% tandem mid stenosis in the AV groove, 50-60% RCA  . Chronic diastolic CHF (congestive heart failure) 09-2008    a. EF 40% by cath 2010. b. Improved - last echo 2015 with normal EF.  [name] Anemia   . Detached retina 02-2005    R EYE  . Fracture 02-2007    L5 AND PELVIS  . Left ventricular dysfunction     a. EF 40% by cath 2010. b. Improved - last echo 2015 with normal EF.   [name] AAA (abdominal aortic aneurysm)     Repair unknown  date  . Critical lower limb ischemia   . Chronic respiratory failure     a. On home O2.  [name] Hypertension   . Warfarin anticoagulation     DC summary from 2010 reports she is "on chronic  Coumadin for vascular disease." This is listed on her med list going back to 2006 by records available. Teaching service H&P remotely says she has h/o DVT but I cannot find any evidence of this. Admission H/P 03/2013 suggests history of atrial fib but all ECGs appear sinus and there is no mention of this previously.  [name] COPD (chronic obstructive pulmonary disease)   . Renal artery stenosis     a. 1-59% by doppler in 2014.  . Type II or  unspecified type diabetes mellitus without mention of complication, not stated as uncontrolled    Past Surgical History  Procedure Laterality Date  . Aorta surgery      aortic anyuersm surgery.   . Vessel surgery      S/P aortobifemoral bypass grafting and right fem-peroneal bypass grafting remotely  . Cholecystectomy    . Cataract surgery  06-2006    R EYE  . Vascular surgery    . Cardiac catheterization    . Eye surgery      detached retina  (r)  . Eye surgery      cataract  OD  . Fracture surgery      sternum  2008  . Patch angioplasty Left 04/19/2013    Procedure: VEIN PATCH ANGIOPLASTY - LEFT COMMON FEMORAL ARTERY;  Surgeon: Angelia Mould, MD;  Location: [facility];  Service: Vascular;  Laterality: Left;  . Femoral-popliteal bypass graft Left 04/19/2013    Procedure: BYPASS GRAFT FEMORAL- BELOW KNEE POPLITEAL ARTERY WITH VEIN;  Surgeon: Angelia Mould, MD;  Location: [facility];  Service: Vascular;  Laterality: Left;   Allergies  Allergen Reactions  . Codeine Other (See Comments)    REACTION: hallucinations  . Hydrochlorothiazide Other (See Comments)    REACTION: hypotension  . Pregabalin Other (See Comments)    REACTION: unknown  . Other Rash    Adhesives and bandages gives pt a rash.   Prior to Admission medications   Medication Sig Start Date End Date Taking? Authorizing Provider  acetaminophen (TYLENOL) 500 MG tablet Take 1,000 mg by mouth every 4 (four) hours as needed for moderate pain.     Historical Provider, MD  albuterol (PROVENTIL) (2.5 MG/3ML) 0.083% nebulizer solution Take 2.5 mg by nebulization 3 (three) times daily as needed for wheezing or shortness of breath.     Historical Provider, MD  aspirin EC 325 MG EC tablet Take 1 tablet (325 mg total) by mouth daily. 04/05/13   Dayna N Dunn, PA-C  budesonide-formoterol (SYMBICORT) 160-4.5 MCG/ACT inhaler Inhale 2 puffs into the lungs 2 (two) times daily. 08/31/12   Darlyne Russian, MD  CRESTOR 20 MG tablet TAKE 1  TABLET DAILY (PATIENT NEEDS OFFICE VISIT/LABS FOR ADDITIONAL REFILLS) 05/21/13   Mancel Bale, PA-C  esomeprazole (NEXIUM) 40 MG capsule Take 40 mg by mouth daily before breakfast. 08/31/12   Darlyne Russian, MD  furosemide (LASIX) 40 MG tablet Take 40 mg by mouth daily. 02/14/13   Darlyne Russian, MD  HYDROcodone-acetaminophen (Sweetser) 7.5-325 MG per tablet Take one half tablet as needed for pain every 6 hours 07/15/13   Sharmon Leyden Nickel, NP  ibandronate (BONIVA) 150 MG tablet Take 150 mg by mouth every 30 (thirty) days. Take in the morning  with a full glass of water, on an empty stomach, and do not take anything else by mouth or lie down for the next 30 min. (take on the 25th of each month)    Historical Provider, MD  Insulin Glargine (LANTUS) 100 UNIT/ML Solostar Pen Inject 14 Units into the skin daily at 10 pm.     Historical Provider, MD  Ipratropium-Albuterol (COMBIVENT RESPIMAT) 20-100 MCG/ACT AERS respimat Inhale 2 puffs into the lungs every 6 (six) hours as needed for wheezing or shortness of breath.     Historical Provider, MD  losartan (COZAAR) 100 MG tablet Take 100 mg by mouth daily. 08/31/12   Darlyne Russian, MD  metoprolol succinate (TOPROL-XL) 25 MG 24 hr tablet Take 25 mg by mouth daily.     Historical Provider, MD  Omega-3 Fatty Acids (FISH OIL) 1200 MG CAPS Take 1,200 mg by mouth 2 (two) times daily. 02/14/13   Darlyne Russian, MD  ranitidine (ZANTAC) 150 MG tablet Take 150 mg by mouth 2 (two) times daily. 08/31/12 08/31/13  Darlyne Russian, MD  rosuvastatin (CRESTOR) 20 MG tablet Take 20 mg by mouth daily. PATIENT NEEDS OFFICE VISIT/LABS FOR ADDITIONAL REFILLS 03/10/13   Darlyne Russian, MD   History   Social History  . Marital Status: Widowed    Spouse Name: N/A    Number of Children: N/A  . Years of Education: N/A   Occupational History  . Not on file.   Social History Main Topics  . Smoking status: Former Smoker    Types: Cigarettes    Quit date: 02/16/1997  . Smokeless tobacco: Never  Used  . Alcohol Use: No  . Drug Use: No  . Sexual Activity: No   Other Topics Concern  . Not on file   Social History Narrative   Lives by self, cares for self, drives.       Review of Systems  Constitutional: Negative for fever and chills.  HENT: Negative for congestion.   Eyes: Negative for redness.  Respiratory: Negative for cough and shortness of breath.   Musculoskeletal: Positive for arthralgias (R lower extremity and foot).  Skin: Negative for rash.  Neurological: Negative for dizziness, syncope, light-headedness and headaches.  Psychiatric/Behavioral: Negative for confusion.    Objective:  Physical Exam  CONSTITUTIONAL: Well developed/well nourished HEAD: Normocephalic/atraumatic EYES: EOMI/PERRL ENMT: Mucous membranes moist NECK: supple no meningeal signs SPINE:entire spine nontender CV: Irregular rhythm noted to LUNGS: Lungs are clear to auscultation bilaterally, no apparent distress there are decreased breath sounds in both bases. ABDOMEN: soft, nontender, no rebound or guarding GU:no cva tenderness NEURO: Pt is awake/alert, moves all extremitiesx4 EXTREMITIES: Both lower legs are ischemic. There is dependent rubor involving the right foot. There are no palpable pulses. SKIN: warm, color normal there is dependent rubor of both feet. PSYCH: no abnormalities of mood noted  Results for orders placed in visit on 06/07/13  GLUCOSE, POCT (MANUAL RESULT ENTRY)      Result Value Ref Range   POC Glucose 118 (*) 70 - 99 mg/dl    Triage Vitals: BP 102/55  Pulse 62  Temp(Src) 97.7 F (36.5 C) (Oral)  Resp 16  Wt 134 lb (60.782 kg)  SpO2 95%   Assessment & Plan:  Repeat blood pressure was 140/60. She looks well. I am concerned about hypoglycemia and feel like it would be prudent to decrease her Lantus by another 2 units. We discussed her use of Tylenol and since she does have a drink  now and then I take her Tylenol should be kept at 2 g a day max. She is aware of  the possible ischemia to her right leg with loss of the right leg I personally performed the services described in this documentation, which was scribed in my presence. The recorded information has been reviewed and is accurate.

## 2013-08-06 ENCOUNTER — Other Ambulatory Visit: Payer: Self-pay | Admitting: Emergency Medicine

## 2013-08-11 ENCOUNTER — Ambulatory Visit
Admission: RE | Admit: 2013-08-11 | Discharge: 2013-08-11 | Disposition: A | Discharge status: Home / Self Care | Payer: Medicare Other | Source: Ambulatory Visit

## 2013-08-11 DIAGNOSIS — Z1231 Encounter for screening mammogram for malignant neoplasm of breast: Secondary | ICD-10-CM

## 2013-08-30 ENCOUNTER — Telehealth: Payer: Self-pay | Admitting: *Deleted

## 2013-08-30 DIAGNOSIS — I739 Peripheral vascular disease, unspecified: Secondary | ICD-10-CM

## 2013-08-30 NOTE — Telephone Encounter (Signed)
I called patient and she is agreeable to proceed.  Order placed

## 2013-08-30 NOTE — Telephone Encounter (Signed)
Message copied by Chauncy Lean on Tue Aug 30, 2013  4:37 PM ------      Message from: Lorretta Harp      Created: Tue Aug 30, 2013  4:10 PM      Regarding: Needs LEA s/p LFPBG       Needs LEAs s/p LFPBG.            JJB ------

## 2013-08-31 ENCOUNTER — Telehealth: Payer: Self-pay

## 2013-08-31 DIAGNOSIS — M79643 Pain in unspecified hand: Secondary | ICD-10-CM

## 2013-08-31 NOTE — Telephone Encounter (Signed)
PT STATES DR DAUB TOLD HER WHENEVER SHE NEEDED HER HYDROCODONE TO GIVE [place] A CALL. PLEASE CALL [phone]

## 2013-09-01 MED ORDER — HYDROCODONE-ACETAMINOPHEN 7.5-325 MG PO TABS: ORAL_TABLET | ORAL | Status: DC

## 2013-09-01 NOTE — Telephone Encounter (Signed)
Printed.  Meds ordered this encounter  Medications  . HYDROcodone-acetaminophen (NORCO) 7.5-325 MG per tablet    Sig: Take one half tablet as needed for pain every 6 hours    Dispense:  30 tablet    Refill:  0

## 2013-09-01 NOTE — Telephone Encounter (Signed)
Patient called checking status of refill request on HYDROcodone-acetaminophen (NORCO) 7.5-325 MG per tablet. Says she is down to her last 2 pills and in pain. She knows it requires a hard copy of the rx to take to pharmacy. Can she get refill? She knows Dr. Everlene Farrier is out of the office but she doesn't want to run out of her medication.

## 2013-09-01 NOTE — Telephone Encounter (Signed)
Lm advising pt script in pick up drawer.

## 2013-09-01 NOTE — Telephone Encounter (Signed)
Ok to refill 

## 2013-09-01 NOTE — Telephone Encounter (Signed)
Pended for Chelle to review.

## 2013-09-08 ENCOUNTER — Ambulatory Visit (INDEPENDENT_AMBULATORY_CARE_PROVIDER_SITE_OTHER): Payer: Medicare Other | Admitting: Cardiology

## 2013-09-08 ENCOUNTER — Emergency Department (HOSPITAL_COMMUNITY)
Admission: EM | Admit: 2013-09-08 | Discharge: 2013-09-08 | Disposition: A | Discharge status: Home / Self Care | Payer: Medicare Other | Attending: Emergency Medicine | Admitting: Emergency Medicine

## 2013-09-08 ENCOUNTER — Emergency Department (HOSPITAL_COMMUNITY): Payer: Medicare Other

## 2013-09-08 ENCOUNTER — Ambulatory Visit (HOSPITAL_COMMUNITY)
Admission: RE | Admit: 2013-09-08 | Discharge: 2013-09-08 | Disposition: A | Discharge status: Home / Self Care | Payer: Medicare Other | Source: Ambulatory Visit | Attending: Cardiology | Admitting: Cardiology

## 2013-09-08 ENCOUNTER — Ambulatory Visit: Payer: Self-pay | Admitting: Pharmacist Clinician (PhC)/ Clinical Pharmacy Specialist

## 2013-09-08 ENCOUNTER — Encounter (HOSPITAL_COMMUNITY): Payer: Self-pay | Admitting: Emergency Medicine

## 2013-09-08 ENCOUNTER — Encounter: Payer: Self-pay | Admitting: Cardiology

## 2013-09-08 VITALS — BP 151/73 | HR 72 | Ht 62.5 in | Wt 133.5 lb

## 2013-09-08 DIAGNOSIS — I110 Hypertensive heart disease with heart failure: Secondary | ICD-10-CM | POA: Diagnosis not present

## 2013-09-08 DIAGNOSIS — Z9889 Other specified postprocedural states: Secondary | ICD-10-CM | POA: Insufficient documentation

## 2013-09-08 DIAGNOSIS — Z862 Personal history of diseases of the blood and blood-forming organs and certain disorders involving the immune mechanism: Secondary | ICD-10-CM | POA: Insufficient documentation

## 2013-09-08 DIAGNOSIS — Z79899 Other long term (current) drug therapy: Secondary | ICD-10-CM | POA: Insufficient documentation

## 2013-09-08 DIAGNOSIS — Z87891 Personal history of nicotine dependence: Secondary | ICD-10-CM | POA: Diagnosis not present

## 2013-09-08 DIAGNOSIS — T82898A Other specified complication of vascular prosthetic devices, implants and grafts, initial encounter: Secondary | ICD-10-CM

## 2013-09-08 DIAGNOSIS — J42 Unspecified chronic bronchitis: Secondary | ICD-10-CM

## 2013-09-08 DIAGNOSIS — T82598A Other mechanical complication of other cardiac and vascular devices and implants, initial encounter: Secondary | ICD-10-CM | POA: Diagnosis not present

## 2013-09-08 DIAGNOSIS — Z8739 Personal history of other diseases of the musculoskeletal system and connective tissue: Secondary | ICD-10-CM | POA: Diagnosis not present

## 2013-09-08 DIAGNOSIS — I509 Heart failure, unspecified: Secondary | ICD-10-CM | POA: Diagnosis not present

## 2013-09-08 DIAGNOSIS — I739 Peripheral vascular disease, unspecified: Secondary | ICD-10-CM | POA: Diagnosis not present

## 2013-09-08 DIAGNOSIS — I999 Unspecified disorder of circulatory system: Secondary | ICD-10-CM

## 2013-09-08 DIAGNOSIS — Y831 Surgical operation with implant of artificial internal device as the cause of abnormal reaction of the patient, or of later complication, without mention of misadventure at the time of the procedure: Secondary | ICD-10-CM | POA: Insufficient documentation

## 2013-09-08 DIAGNOSIS — N183 Chronic kidney disease, stage 3 unspecified: Secondary | ICD-10-CM

## 2013-09-08 DIAGNOSIS — Z7901 Long term (current) use of anticoagulants: Secondary | ICD-10-CM | POA: Diagnosis not present

## 2013-09-08 DIAGNOSIS — I2510 Atherosclerotic heart disease of native coronary artery without angina pectoris: Secondary | ICD-10-CM | POA: Insufficient documentation

## 2013-09-08 DIAGNOSIS — Z7982 Long term (current) use of aspirin: Secondary | ICD-10-CM | POA: Insufficient documentation

## 2013-09-08 DIAGNOSIS — M259 Joint disorder, unspecified: Secondary | ICD-10-CM | POA: Insufficient documentation

## 2013-09-08 DIAGNOSIS — E782 Mixed hyperlipidemia: Secondary | ICD-10-CM | POA: Diagnosis not present

## 2013-09-08 DIAGNOSIS — I5032 Chronic diastolic (congestive) heart failure: Secondary | ICD-10-CM | POA: Insufficient documentation

## 2013-09-08 DIAGNOSIS — I998 Other disorder of circulatory system: Secondary | ICD-10-CM | POA: Diagnosis not present

## 2013-09-08 DIAGNOSIS — E119 Type 2 diabetes mellitus without complications: Secondary | ICD-10-CM

## 2013-09-08 DIAGNOSIS — Z9981 Dependence on supplemental oxygen: Secondary | ICD-10-CM | POA: Diagnosis not present

## 2013-09-08 DIAGNOSIS — I701 Atherosclerosis of renal artery: Secondary | ICD-10-CM | POA: Insufficient documentation

## 2013-09-08 DIAGNOSIS — Z794 Long term (current) use of insulin: Secondary | ICD-10-CM | POA: Diagnosis not present

## 2013-09-08 DIAGNOSIS — J449 Chronic obstructive pulmonary disease, unspecified: Secondary | ICD-10-CM | POA: Insufficient documentation

## 2013-09-08 DIAGNOSIS — J4489 Other specified chronic obstructive pulmonary disease: Secondary | ICD-10-CM | POA: Insufficient documentation

## 2013-09-08 LAB — BASIC METABOLIC PANEL
Anion gap: 15 (ref 5–15)
BUN: 43 mg/dL — AB (ref 6–23)
CO2: 23 meq/L (ref 19–32)
CREATININE: 2.02 mg/dL — AB (ref 0.50–1.10)
Calcium: 9.3 mg/dL (ref 8.4–10.5)
Chloride: 106 mEq/L (ref 96–112)
GFR calc non Af Amer: 21 mL/min — ABNORMAL LOW (ref >90)
GFR, EST AFRICAN AMERICAN: 24 mL/min — AB (ref >90)
Glucose, Bld: 163 mg/dL — ABNORMAL HIGH (ref 70–99)
POTASSIUM: 4.1 meq/L (ref 3.7–5.3)
Sodium: 144 mEq/L (ref 137–147)

## 2013-09-08 LAB — CBC
HEMATOCRIT: 33.4 % — AB (ref 36.0–46.0)
HEMOGLOBIN: 10.4 g/dL — AB (ref 12.0–15.0)
MCH: 29.7 pg (ref 26.0–34.0)
MCHC: 31.1 g/dL (ref 30.0–36.0)
MCV: 95.4 fL (ref 78.0–100.0)
Platelets: 178 10*3/uL (ref 150–400)
RBC: 3.5 MIL/uL — AB (ref 3.87–5.11)
RDW: 14.3 % (ref 11.5–15.5)
WBC: 8 10*3/uL (ref 4.0–10.5)

## 2013-09-08 LAB — TYPE AND SCREEN
ABO/RH(D): B POS
Antibody Screen: NEGATIVE

## 2013-09-08 LAB — PROTIME-INR
INR: 1.05 (ref 0.00–1.49)
Prothrombin Time: 13.7 seconds (ref 11.6–15.2)

## 2013-09-08 LAB — APTT: APTT: 31 s (ref 24–37)

## 2013-09-08 MED ORDER — MORPHINE SULFATE 2 MG/ML IJ SOLN
2.0000 mg | Freq: Once | INTRAMUSCULAR | Status: AC
  Administered 2013-09-08: 2 mg via INTRAVENOUS
  Filled 2013-09-08: qty 1

## 2013-09-08 MED ORDER — CEPHALEXIN 500 MG PO CAPS
500.0000 mg | ORAL_CAPSULE | Freq: Three times a day (TID) | ORAL | Status: DC
Start: 2013-09-08 — End: 2013-09-26

## 2013-09-08 MED ORDER — CEPHALEXIN 250 MG PO CAPS
500.0000 mg | ORAL_CAPSULE | Freq: Once | ORAL | Status: AC
  Administered 2013-09-08: 500 mg via ORAL
  Filled 2013-09-08: qty 2

## 2013-09-08 NOTE — ED Notes (Signed)
Abigail PA and resident at bedside.

## 2013-09-08 NOTE — Discharge Instructions (Signed)
Return to the emergency department if you develop fevers, chills, nausea, vomiting, muscle aches, change in your mental status or other symptoms of infection.  Peripheral Vascular Disease Peripheral Vascular Disease (PVD), also called Peripheral Arterial Disease (PAD), is a circulation problem caused by cholesterol (atherosclerotic plaque) deposits in the arteries. PVD commonly occurs in the lower extremities (legs) but it can occur in other areas of the body, such as your arms. The cholesterol buildup in the arteries reduces blood flow which can cause pain and other serious problems. The presence of PVD can place a person at risk for Coronary Artery Disease (CAD).  CAUSES  Causes of PVD can be many. It is usually associated with more than one risk factor such as:   High Cholesterol.  Smoking.  Diabetes.  Lack of exercise or inactivity.  High blood pressure (hypertension).  Obesity.  Family history. SYMPTOMS   When the lower extremities are affected, patients with PVD may experience:  Leg pain with exertion or physical activity. This is called INTERMITTENT CLAUDICATION. This may present as cramping or numbness with physical activity. The location of the pain is associated with the level of blockage. For example, blockage at the abdominal level (distal abdominal aorta) may result in buttock or hip pain. Lower leg arterial blockage may result in calf pain.  As PVD becomes more severe, pain can develop with less physical activity.  In people with severe PVD, leg pain may occur at rest.  Other PVD signs and symptoms:  Leg numbness or weakness.  Coldness in the affected leg or foot, especially when compared to the other leg.  A change in leg color.  Patients with significant PVD are more prone to ulcers or sores on toes, feet or legs. These may take longer to heal or may reoccur. The ulcers or sores can become infected.  If signs and symptoms of PVD are ignored, gangrene may occur.  This can result in the loss of toes or loss of an entire limb.  Not all leg pain is related to PVD. Other medical conditions can cause leg pain such as:  Blood clots (embolism) or Deep Vein Thrombosis.  Inflammation of the blood vessels (vasculitis).  Spinal stenosis. DIAGNOSIS  Diagnosis of PVD can involve several different types of tests. These can include:  Pulse Volume Recording Method (PVR). This test is simple, painless and does not involve the use of X-rays. PVR involves measuring and comparing the blood pressure in the arms and legs. An ABI (Ankle-Brachial Index) is calculated. The normal ratio of blood pressures is 1. As this number becomes smaller, it indicates more severe disease.  < 0.95 - indicates significant narrowing in one or more leg vessels.  <0.8 - there will usually be pain in the foot, leg or buttock with exercise.  <0.4 - will usually have pain in the legs at rest.  <0.25 - usually indicates limb threatening PVD.  Doppler detection of pulses in the legs. This test is painless and checks to see if you have a pulses in your legs/feet.  A dye or contrast material (a substance that highlights the blood vessels so they show up on x-ray) may be given to help your caregiver better see the arteries for the following tests. The dye is eliminated from your body by the kidney's. Your caregiver may order blood work to check your kidney function and other laboratory values before the following tests are performed:  Magnetic Resonance Angiography (MRA). An MRA is a picture study of the  blood vessels and arteries. The MRA machine uses a large magnet to produce images of the blood vessels.  Computed Tomography Angiography (CTA). A CTA is a specialized x-ray that looks at how the blood flows in your blood vessels. An IV may be inserted into your arm so contrast dye can be injected.  Angiogram. Is a procedure that uses x-rays to look at your blood vessels. This procedure is  minimally invasive, meaning a small incision (cut) is made in your groin. A small tube (catheter) is then inserted into the artery of your groin. The catheter is guided to the blood vessel or artery your caregiver wants to examine. Contrast dye is injected into the catheter. X-rays are then taken of the blood vessel or artery. After the images are obtained, the catheter is taken out. TREATMENT  Treatment of PVD involves many interventions which may include:  Lifestyle changes:  Quitting smoking.  Exercise.  Following a low fat, low cholesterol diet.  Control of diabetes.  Foot care is very important to the PVD patient. Good foot care can help prevent infection.  Medication:  Cholesterol-lowering medicine.  Blood pressure medicine.  Anti-platelet drugs.  Certain medicines may reduce symptoms of Intermittent Claudication.  Interventional/Surgical options:  Angioplasty. An Angioplasty is a procedure that inflates a balloon in the blocked artery. This opens the blocked artery to improve blood flow.  Stent Implant. A wire mesh tube (stent) is placed in the artery. The stent expands and stays in place, allowing the artery to remain open.  Peripheral Bypass Surgery. This is a surgical procedure that reroutes the blood around a blocked artery to help improve blood flow. This type of procedure may be performed if Angioplasty or stent implants are not an option. SEEK IMMEDIATE MEDICAL CARE IF:   You develop pain or numbness in your arms or legs.  Your arm or leg turns cold, becomes blue in color.  You develop redness, warmth, swelling and pain in your arms or legs. MAKE SURE YOU:   Understand these instructions.  Will watch your condition.  Will get help right away if you are not doing well or get worse. Document Released: 03/13/2004 Document Revised: 04/28/2011 Document Reviewed: 02/08/2008 [facility] Patient Information 2015 [place], [place]. This information is not intended to  replace advice given to you by your health care provider. Make sure you discuss any questions you have with your health care provider.

## 2013-09-08 NOTE — ED Notes (Signed)
Pt in stating she was sent here from Dr. Kennon Holter office after a lower leg doppler which showed a complete occlusion to her fem-pop graft, sent here to be seen by MD Scot Dock from vascular. Pt right foot blue with redness, cool to touch.

## 2013-09-08 NOTE — Patient Instructions (Signed)
Go to ER

## 2013-09-08 NOTE — Assessment & Plan Note (Signed)
No chest pain

## 2013-09-08 NOTE — Assessment & Plan Note (Signed)
Bil PAD, with severe critical limb ischemia in rt.

## 2013-09-08 NOTE — Consult Note (Signed)
[facility] [place]  Patient name: Suzanne [name] MRN: [id number] DOB: 09/20/1921 Sex: female  REASON FOR CONSULT: Ischemic right foot.  HPI: Suzanne [name] is a 80 y.o. female who had undergone a right femoral to anterior tibial artery bypass graft in 2009 by Dr. Drucie Opitz. She has ulcerative undergone previous aortobifemoral bypass grafting. In May he was noted that her right femoral tibial bypass graft is occluded it was not clear when that occurred. She does not have vein available in the right leg and there were very limited options for revascularization on the right. I last saw her on 07/20/2013. At that time the foot was viable and she had claudication but no rest pain. Since that time, she's had progressively increasing pain in the right foot. She was seen in Dr. Jacalyn Lefevre office today and felt to have significant ischemia of the right foot. She is sent to the emergency department for further evaluation.  Of note she's had a recent left femoropopliteal bypass grafting on the left and has done well from that standpoint with a palpable graft pulse and no issues on the left side. She has progressive rest pain of the right foot. She denies fever or chills. She denies any drainage from the foot.  Past Medical History  Diagnosis Date  . PVD (peripheral vascular disease)     a. PTA & stent right superficial artery PTA & left common iliac artery stenting. Right fem-pop bypass graft. b. Per Dr. Kennon Holter note: h/o aortobifemoral bypass grafting remotely as well as fem-peroneal bypass grafting.  . Mixed hyperlipidemia   . Compression fx, thoracic spine     T12  . Ischium fracture   . Memory loss, short term   . Pulmonary nodule   . CAD (coronary artery disease) 10/12/2008    a. 30% LAD,30% CX & 40% tandem mid stenosis in the AV groove, 50-60% RCA  . Chronic diastolic CHF (congestive heart failure) 09-2008    a. EF 40% by cath 2010. b. Improved - last echo 2015 with  normal EF.  [name] Anemia   . Detached retina 02-2005    R EYE  . Fracture 02-2007    L5 AND PELVIS  . Left ventricular dysfunction     a. EF 40% by cath 2010. b. Improved - last echo 2015 with normal EF.   [name] AAA (abdominal aortic aneurysm)     Repair unknown date  . Critical lower limb ischemia   . Chronic respiratory failure     a. On home O2.  [name] Hypertension   . Warfarin anticoagulation     DC summary from 2010 reports she is "on chronic  Coumadin for vascular disease." This is listed on her med list going back to 2006 by records available. Teaching service H&P remotely says she has h/o DVT but I cannot find any evidence of this. Admission H/P 03/2013 suggests history of atrial fib but all ECGs appear sinus and there is no mention of this previously.  [name] COPD (chronic obstructive pulmonary disease)   . Renal artery stenosis     a. 1-59% by doppler in 2014.  . Type II or unspecified type diabetes mellitus without mention of complication, not stated as uncontrolled    Family History  Problem Relation Age of Onset  . Tuberculosis Mother   . Cancer Father    SOCIAL HISTORY: History  Substance Use Topics  . Smoking status: Former Smoker    Types: Cigarettes  Quit date: 02/16/1997  . Smokeless tobacco: Never Used  . Alcohol Use: No   Allergies  Allergen Reactions  . Codeine Other (See Comments)    REACTION: hallucinations  . Hydrochlorothiazide Other (See Comments)    REACTION: hypotension  . Pregabalin Other (See Comments)    REACTION: unknown  . Other Rash    Adhesives and bandages gives pt a rash.   REVIEW OF SYSTEMS: Valu.Nieves ] denotes positive finding; [  ] denotes negative finding CARDIOVASCULAR:  [ ]  chest pain   [ ]  chest pressure   [ ]  palpitations   [ ]  orthopnea   Valu.Nieves ] dyspnea on exertion   [ ]  claudication   [ ]  rest pain   [ ]  DVT   [ ]  phlebitis PULMONARY:   [ ]  productive cough   [ ]  asthma   [ ]  wheezing NEUROLOGIC:   [ ]  weakness  Valu.Nieves ] paresthesias  [ ]  aphasia  [  ] amaurosis  [ ]  dizziness HEMATOLOGIC:   [ ]  bleeding problems   [ ]  clotting disorders MUSCULOSKELETAL:  Valu.Nieves ] joint pain   [ ]  joint swelling [ ]  leg swelling GASTROINTESTINAL: [ ]   blood in stool  [ ]   hematemesis GENITOURINARY:  [ ]   dysuria  [ ]   hematuria PSYCHIATRIC:  [ ]  history of major depression INTEGUMENTARY:  Valu.Nieves ] rashes  [ ]  ulcers CONSTITUTIONAL:  [ ]  fever   [ ]  chills  PHYSICAL EXAM: Filed Vitals:   09/08/13 1600 09/08/13 1615 09/08/13 1617 09/08/13 1707  BP: 141/57 141/56 141/56 119/56  Pulse: 67 72  68  Temp:    98.7 F (37.1 C)  TempSrc:    Oral  Resp: 17 12 17 16   SpO2: 98% 100% 100% 100%   There is no weight on file to calculate BMI. GENERAL: The patient is a well-nourished female, in no acute distress. The vital signs are documented above. CARDIOVASCULAR: There is a regular rate and rhythm. She has a right carotid bruit. She has palpable femoral pulses. On the right side, which is the symptomatic side, I cannot palpate popliteal pulse or pedal pulses. She does have a monophasic anterior tibial signal on the right with the Doppler. The foot appeared chronically ischemic. On the left side she has a palpable popliteal pulse. She has a palpable dorsalis pedis pulse. PULMONARY: There is good air exchange bilaterally without wheezing or rales. ABDOMEN: Soft and non-tender with normal pitched bowel sounds.  MUSCULOSKELETAL: she has chronic ischemic changes of the right foot with pallor. NEUROLOGIC: No focal weakness or paresthesias are detected. SKIN: She has cellulitis of the right leg. PSYCHIATRIC: The patient has a normal affect.  DATA:  Lab Results  Component Value Date   WBC 8.0 09/08/2013   HGB 10.4* 09/08/2013   HCT 33.4* 09/08/2013   MCV 95.4 09/08/2013   PLT 178 09/08/2013   Lab Results  Component Value Date   NA 144 09/08/2013   K 4.1 09/08/2013   CL 106 09/08/2013   CO2 23 09/08/2013   Lab Results  Component Value Date   CREATININE 2.02* 09/08/2013    Lab Results  Component Value Date   INR 1.05 09/08/2013   INR 1.05 04/18/2013   INR 1.15 04/04/2013   PROTIME 29.3* 08/01/2011   Lab Results  Component Value Date   HGBA1C 6.5 08/04/2013   MEDICAL ISSUES:  CHRONIC RIGHT LOWER EXTREMITY ISCHEMIA: This patient has a chronically occluded right femoral to tibial artery  bypass graft which was done with a prosthetic graft. I discussed with her previously that there were really no good long-term options for revascularization of the right lower extremity. She has no available vein for bypass. We discussed potentially obtaining an arteriogram if she wished to consider a redo bypass. However, given her age and medical comorbidities I thought this was a long shot and would be associated with significant risk. At this point given the progressive ischemia of her right lower extremity I would recommend right above-the-knee amputation. She has calf tenderness on the right and cellulitis extending up the leg and I think she would be at significant risk for not healing a below the knee amputation. At this point she does not wish to proceed with amputation and would like to think about this further understandably. She is concerned about her living situation if she proceeds with amputation. At this point is no evidence of significant sepsis and their main indications for amputation would be if her pain more not tolerable. The emergency department has given her a prescription for Keflex and she will be discharged and give this consideration at home with help of her family. If she wishes to proceed with amputation and certainly this can be scheduled. Again I would recommend above-the-knee amputation.  [facility]  [place]: [phone]

## 2013-09-08 NOTE — Assessment & Plan Note (Signed)
Last Cr 2.5 in June

## 2013-09-08 NOTE — ED Notes (Signed)
Dr. Scot Dock- vascular at bedside.

## 2013-09-08 NOTE — Progress Notes (Signed)
09/08/2013   PCP: Jenny Reichmann, MD   Chief Complaint  Patient presents with  . Follow-up    Doppler. C/o leg pain. She is able o wlak very little due to the leg pain.    Primary Cardiologist:Dr. Adora Fridge   HPI:  80 year old female with hx of PAD with hx aortobifemoral bypass grafting remotely as well as fem-peroneal bypass grafting. Dr. Adora Fridge previously angiogram'd her February 02, 1998, revealing patient aortobifemoral and a patent right fem-peroneal bypass graft with a high-grade lesion beyond the graft insertion. She had 1-vessel runoff on the right, 2 on the left with high-grade distal left SFA disease on the left and bilateral renal artery stenosis.  More recently she had PV angiogram 04/04/13 Left lower extremity-there was a 95% calcific plaque in the left common femoral artery to just distal to the distal anastomosis of the left limb of the aortobifemoral bypass graft. There was a 50% segmental mid left SFA, tandem 95% stenoses in the distal left SFA and above-the-knee popliteal artery. P2 and P3 segments appeared free of significant disease. There is 1 vessel runoff via the anterior tibial. She was seen by Dr. Scot Dock and Shon Millet. Endarterectomy of left common femoral artery with bovine pericardial patch angioplasty of distal left limb of aortofemoral bypass graft  2. Left femoral to below knee popliteal artery bypass with non-reversed translocated saphenous vein graft -04/19/13.  On follow up by Dr. Scot Dock 07/20/13 with known occluded right femoral to anterior tibial artery bypass graft which was done with a prosthetic graft back in 2009.  They could obtain some doppler flow in rt foot. She had exertional foot pain.  Plan was for Dr. Gwenlyn Found to repeat angiogram on Rt.  She presented today for dopplers and was found to have no pulses in rt.  Foot was red and tender to touch with constant pain, only sleeping from exhaustion.  + scabbed blisters on rt. Foot new from last week to  2 weeks.    No chest pain, no SOB other than her usual COPD with home oxygen at night. Last nuc 2011 Mild septal and inferoseptal hypokinesis with estimated  quantitative ejection fraction of 40%. No evidence of ischemia.  Last Echo 03/2013 - Left ventricle: The cavity size was normal. Systolic function was vigorous. The estimated ejection fraction was in the range of 65% to 70%. Wall motion was normal; there were no regional wall motion abnormalities. Doppler parameters are consistent with abnormal left ventricular relaxation (grade 1 diastolic dysfunction). - Aortic valve: Valve area: 1.54cm^2(VTI). Valve area: 1.5cm^2 (Vmax). - Mitral valve: Calcified annulus. Mild regurgitation. Valve area by continuity equation (using LVOT flow): 1.97cm^2. - Left atrium: The atrium was mildly dilated. Anterior-posterior dimension: 56mm (2D). Impressions:  - No significant change from prior ECHO. EF is more vigorous on current study. Mild CAD on cath 2010.   Allergies  Allergen Reactions  . Codeine Other (See Comments)    REACTION: hallucinations  . Hydrochlorothiazide Other (See Comments)    REACTION: hypotension  . Pregabalin Other (See Comments)    REACTION: unknown  . Other Rash    Adhesives and bandages gives pt a rash.    Current Outpatient Prescriptions  Medication Sig Dispense Refill  . acetaminophen (TYLENOL) 500 MG tablet Take 1,000 mg by mouth every 4 (four) hours as needed for moderate pain.       [name] albuterol (PROVENTIL) (2.5 MG/3ML) 0.083% nebulizer solution Take 2.5 mg  by nebulization 3 (three) times daily as needed for wheezing or shortness of breath.       [name] aspirin EC 325 MG EC tablet Take 1 tablet (325 mg total) by mouth daily.      . budesonide-formoterol (SYMBICORT) 160-4.5 MCG/ACT inhaler Inhale 2 puffs into the lungs 2 (two) times daily.      . CRESTOR 20 MG tablet TAKE 1 TABLET DAILY (PATIENT NEEDS OFFICE VISIT/LABS FOR ADDITIONAL REFILLS)  30 tablet  0  .  esomeprazole (NEXIUM) 40 MG capsule Take 40 mg by mouth daily before breakfast.      . furosemide (LASIX) 40 MG tablet Take 40 mg by mouth daily.      [name] HYDROcodone-acetaminophen (NORCO) 7.5-325 MG per tablet Take one half tablet as needed for pain every 6 hours  30 tablet  0  . ibandronate (BONIVA) 150 MG tablet Take 150 mg by mouth every 30 (thirty) days. Take in the morning with a full glass of water, on an empty stomach, and do not take anything else by mouth or lie down for the next 30 min. (take on the 25th of each month)      . Insulin Glargine (LANTUS SOLOSTAR) 100 UNIT/ML Solostar Pen INJECT 12 UNITS DAILY      . Ipratropium-Albuterol (COMBIVENT RESPIMAT) 20-100 MCG/ACT AERS respimat Inhale 2 puffs into the lungs every 6 (six) hours as needed for wheezing or shortness of breath.       . losartan (COZAAR) 100 MG tablet Take 100 mg by mouth daily.      . metoprolol succinate (TOPROL-XL) 25 MG 24 hr tablet Take 25 mg by mouth daily.       . Omega-3 Fatty Acids (FISH OIL) 1200 MG CAPS Take 1,200 mg by mouth 2 (two) times daily.      . ranitidine (ZANTAC) 150 MG tablet Take 150 mg by mouth 2 (two) times daily.      . rosuvastatin (CRESTOR) 20 MG tablet Take 20 mg by mouth daily. PATIENT NEEDS OFFICE VISIT/LABS FOR ADDITIONAL REFILLS       No current facility-administered medications for this visit.    Past Medical History  Diagnosis Date  . PVD (peripheral vascular disease)     a. PTA & stent right superficial artery PTA & left common iliac artery stenting. Right fem-pop bypass graft. b. Per Dr. Kennon Holter note: h/o aortobifemoral bypass grafting remotely as well as fem-peroneal bypass grafting.  . Mixed hyperlipidemia   . Compression fx, thoracic spine     T12  . Ischium fracture   . Memory loss, short term   . Pulmonary nodule   . CAD (coronary artery disease) 10/12/2008    a. 30% LAD,30% CX & 40% tandem mid stenosis in the AV groove, 50-60% RCA  . Chronic diastolic CHF (congestive heart  failure) 09-2008    a. EF 40% by cath 2010. b. Improved - last echo 2015 with normal EF.  [name] Anemia   . Detached retina 02-2005    R EYE  . Fracture 02-2007    L5 AND PELVIS  . Left ventricular dysfunction     a. EF 40% by cath 2010. b. Improved - last echo 2015 with normal EF.   [name] AAA (abdominal aortic aneurysm)     Repair unknown date  . Critical lower limb ischemia   . Chronic respiratory failure     a. On home O2.  [name] Hypertension   . Warfarin anticoagulation     DC summary  from 2010 reports she is "on chronic  Coumadin for vascular disease." This is listed on her med list going back to 2006 by records available. Teaching service H&P remotely says she has h/o DVT but I cannot find any evidence of this. Admission H/P 03/2013 suggests history of atrial fib but all ECGs appear sinus and there is no mention of this previously.  [name] COPD (chronic obstructive pulmonary disease)   . Renal artery stenosis     a. 1-59% by doppler in 2014.  . Type II or unspecified type diabetes mellitus without mention of complication, not stated as uncontrolled     Past Surgical History  Procedure Laterality Date  . Aorta surgery      aortic anyuersm surgery.   . Vessel surgery      S/P aortobifemoral bypass grafting and right fem-peroneal bypass grafting remotely  . Cholecystectomy    . Cataract surgery  06-2006    R EYE  . Vascular surgery    . Cardiac catheterization    . Eye surgery      detached retina  (r)  . Eye surgery      cataract  OD  . Fracture surgery      sternum  2008  . Patch angioplasty Left 04/19/2013    Procedure: VEIN PATCH ANGIOPLASTY - LEFT COMMON FEMORAL ARTERY;  Surgeon: Angelia Mould, MD;  Location: [facility];  Service: Vascular;  Laterality: Left;  . Femoral-popliteal bypass graft Left 04/19/2013    Procedure: BYPASS GRAFT FEMORAL- BELOW KNEE POPLITEAL ARTERY WITH VEIN;  Surgeon: Angelia Mould, MD;  Location: [facility];  Service: Vascular;  Laterality: Left;     HYW:VPXTGGY:IR colds or fevers, no weight changes Skin:no rashes or ulcers HEENT:no blurred vision, no congestion CV:see HPI PUL:see HPI GI:no diarrhea constipation or melena, no indigestion GU:no hematuria, no dysuria MS:no joint pain, + rest claudication of rt foot lower leg Neuro:no syncope, no lightheadedness Endo:no diabetes, no thyroid disease  Wt Readings from Last 3 Encounters:  09/08/13 133 lb 8 oz (60.555 kg)  08/04/13 134 lb (60.782 kg)  07/20/13 135 lb (61.236 kg)    PHYSICAL EXAM BP 151/73  Pulse 72  Ht 5' 2.5" (1.588 m)  Wt 133 lb 8 oz (60.555 kg)  BMI 24.01 kg/m2 General:Pleasant affect, NAD Skin:Warm and dry, brisk capillary refill HEENT:normocephalic, sclera clear, mucus membranes moist Neck:supple, no JVD, no bruits  Heart:S1S2 RRR without murmur, gallup, rub or click Lungs:clear without rales, rhonchi, or wheezes SWN:IOEV, non tender, + BS, do not palpate liver spleen or masses Ext:no lower ext edema, no pulse on rt no pedal no PT.foot red, scabs from blisters on rt foot at toes. Skin red from foot to mid calf, ant. painful to touch.  2+ radial pulses Neuro:alert and oriented X 3, MAE, follows commands, + facial symmetry   ASSESSMENT AND PLAN Critical lower limb ischemia RT foot with pain, redness and no pedal or PT tib pulse, this has progressed in severity in last 1-2 weeks. Dr. Stanford Breed has discussed with Dr. Scot Dock and we are sending to ER.  Peripheral vascular disease, unspecified Bil PAD, with severe critical limb ischemia in rt.  Type II or unspecified type diabetes mellitus without mention of complication, not stated as uncontrolled Followed by PCP  CAD (coronary artery disease) No chest pain  COPD (chronic obstructive pulmonary disease) With home oxygen, stable  Chronic kidney disease, stage 3 Last Cr 2.5 in June    As above, patient seen and examined.  Patient has presented with worsening pain in her right lower extremity. She  does not have a dopplerable dorsalis pedis or posterior tibial pulse. Her right lower extremity is erythematous and there is discoloration of her digits. She has critical ischemia. I have discussed the patient with Dr. Doren Custard. The patient will be sent to the emergency room and he will evaluate the patient at that point. May need revascularization versus amputation. Kirk Ruths

## 2013-09-08 NOTE — ED Provider Notes (Signed)
CSN: [id number]     Arrival date & time 09/08/13  1511 History   First MD Initiated Contact with Patient 09/08/13 1522     Chief Complaint  Patient presents with  . Foot Problem     (Consider location/radiation/quality/duration/timing/severity/associated sxs/prior Treatment) HPI Suzanne [name] An 80 year old female sent over from her cardiologist's office for acute ischemic right leg. She has a past medical history of known peripheral vascular and coronary artery disease. Her daughter is with her today. She has had worsening pain, numbness, discoloration in the right leg over the past 4-5 days. She went in to see her cardiologist today who ordered a stat Doppler of her right leg and was found to have complete occlusion of the right femoropopliteal bypass graft. Dr. Scot Dock who is on call for vascular has been informed and is on his way here to the emergency appointment to see the patient Denies fevers, chills, myalgias, arthralgias. Denies DOE, SOB, chest tightness or pressure, radiation to left arm, jaw or back, or diaphoresis. Denies dysuria, flank pain, suprapubic pain, frequency, urgency, or hematuria. Denies headaches, light headedness, weakness, visual disturbances. Denies abdominal pain, nausea, vomiting, diarrhea or constipation.    Past Medical History  Diagnosis Date  . PVD (peripheral vascular disease)     a. PTA & stent right superficial artery PTA & left common iliac artery stenting. Right fem-pop bypass graft. b. Per Dr. Kennon Holter note: h/o aortobifemoral bypass grafting remotely as well as fem-peroneal bypass grafting.  . Mixed hyperlipidemia   . Compression fx, thoracic spine     T12  . Ischium fracture   . Memory loss, short term   . Pulmonary nodule   . CAD (coronary artery disease) 10/12/2008    a. 30% LAD,30% CX & 40% tandem mid stenosis in the AV groove, 50-60% RCA  . Chronic diastolic CHF (congestive heart failure) 09-2008    a. EF 40% by cath 2010. b. Improved - last  echo 2015 with normal EF.  [name] Anemia   . Detached retina 02-2005    R EYE  . Fracture 02-2007    L5 AND PELVIS  . Left ventricular dysfunction     a. EF 40% by cath 2010. b. Improved - last echo 2015 with normal EF.   [name] AAA (abdominal aortic aneurysm)     Repair unknown date  . Critical lower limb ischemia   . Chronic respiratory failure     a. On home O2.  [name] Hypertension   . Warfarin anticoagulation     DC summary from 2010 reports she is "on chronic  Coumadin for vascular disease." This is listed on her med list going back to 2006 by records available. Teaching service H&P remotely says she has h/o DVT but I cannot find any evidence of this. Admission H/P 03/2013 suggests history of atrial fib but all ECGs appear sinus and there is no mention of this previously.  [name] COPD (chronic obstructive pulmonary disease)   . Renal artery stenosis     a. 1-59% by doppler in 2014.  . Type II or unspecified type diabetes mellitus without mention of complication, not stated as uncontrolled    Past Surgical History  Procedure Laterality Date  . Aorta surgery      aortic anyuersm surgery.   . Vessel surgery      S/P aortobifemoral bypass grafting and right fem-peroneal bypass grafting remotely  . Cholecystectomy    . Cataract surgery  06-2006    R EYE  .  Vascular surgery    . Cardiac catheterization    . Eye surgery      detached retina  (r)  . Eye surgery      cataract  OD  . Fracture surgery      sternum  2008  . Patch angioplasty Left 04/19/2013    Procedure: VEIN PATCH ANGIOPLASTY - LEFT COMMON FEMORAL ARTERY;  Surgeon: Angelia Mould, MD;  Location: [facility];  Service: Vascular;  Laterality: Left;  . Femoral-popliteal bypass graft Left 04/19/2013    Procedure: BYPASS GRAFT FEMORAL- BELOW KNEE POPLITEAL ARTERY WITH VEIN;  Surgeon: Angelia Mould, MD;  Location: [facility] OR;  Service: Vascular;  Laterality: Left;   Family History  Problem Relation Age of Onset  . Tuberculosis Mother    . Cancer Father    History  Substance Use Topics  . Smoking status: Former Smoker    Types: Cigarettes    Quit date: 02/16/1997  . Smokeless tobacco: Never Used  . Alcohol Use: No   OB History   Grav Para Term Preterm Abortions TAB SAB Ect Mult Living                 Review of Systems  Constitutional: Negative for fever and chills.  HENT: Negative for trouble swallowing.   Respiratory: Negative for shortness of breath.   Cardiovascular: Negative for chest pain.  Gastrointestinal: Negative for nausea, vomiting, abdominal pain, diarrhea and constipation.  Genitourinary: Negative for dysuria and hematuria.  Musculoskeletal: Positive for gait problem. Negative for arthralgias and myalgias.  Skin: Positive for color change and pallor. Negative for rash.  Neurological: Negative for numbness.  All other systems reviewed and are negative.     Allergies  Codeine; Hydrochlorothiazide; Pregabalin; and Other  Home Medications   Prior to Admission medications   Medication Sig Start Date End Date Taking? Authorizing Provider  acetaminophen (TYLENOL) 500 MG tablet Take 1,000 mg by mouth every 4 (four) hours as needed for moderate pain.     Historical Provider, MD  albuterol (PROVENTIL) (2.5 MG/3ML) 0.083% nebulizer solution Take 2.5 mg by nebulization 3 (three) times daily as needed for wheezing or shortness of breath.     Historical Provider, MD  aspirin EC 325 MG EC tablet Take 1 tablet (325 mg total) by mouth daily. 04/05/13   Dayna N Dunn, PA-C  budesonide-formoterol (SYMBICORT) 160-4.5 MCG/ACT inhaler Inhale 2 puffs into the lungs 2 (two) times daily. 08/31/12   Darlyne Russian, MD  CRESTOR 20 MG tablet TAKE 1 TABLET DAILY (PATIENT NEEDS OFFICE VISIT/LABS FOR ADDITIONAL REFILLS) 05/21/13   Mancel Bale, PA-C  esomeprazole (NEXIUM) 40 MG capsule Take 40 mg by mouth daily before breakfast. 08/31/12   Darlyne Russian, MD  furosemide (LASIX) 40 MG tablet Take 40 mg by mouth daily. 02/14/13    Darlyne Russian, MD  HYDROcodone-acetaminophen [facility]) 7.5-325 MG per tablet Take one half tablet as needed for pain every 6 hours 09/01/13   Chelle S Jeffery, PA-C  ibandronate (BONIVA) 150 MG tablet Take 150 mg by mouth every 30 (thirty) days. Take in the morning with a full glass of water, on an empty stomach, and do not take anything else by mouth or lie down for the next 30 min. (take on the 25th of each month)    Historical Provider, MD  Insulin Glargine (LANTUS SOLOSTAR) 100 UNIT/ML Solostar Pen INJECT 12 UNITS DAILY    Mancel Bale, PA-C  Ipratropium-Albuterol (COMBIVENT RESPIMAT) 20-100 MCG/ACT AERS respimat  Inhale 2 puffs into the lungs every 6 (six) hours as needed for wheezing or shortness of breath.     Historical Provider, MD  losartan (COZAAR) 100 MG tablet Take 100 mg by mouth daily. 08/31/12   Darlyne Russian, MD  metoprolol succinate (TOPROL-XL) 25 MG 24 hr tablet Take 25 mg by mouth daily.     Historical Provider, MD  Omega-3 Fatty Acids (FISH OIL) 1200 MG CAPS Take 1,200 mg by mouth 2 (two) times daily. 02/14/13   Darlyne Russian, MD  ranitidine (ZANTAC) 150 MG tablet Take 150 mg by mouth 2 (two) times daily. 08/31/12   Darlyne Russian, MD  rosuvastatin (CRESTOR) 20 MG tablet Take 20 mg by mouth daily. PATIENT NEEDS OFFICE VISIT/LABS FOR ADDITIONAL REFILLS 03/10/13   Darlyne Russian, MD   BP 149/47  Pulse 73  Temp(Src) 97.6 F (36.4 C) (Oral)  Resp 18  SpO2 100% Physical Exam  Constitutional: She is oriented to person, place, and time. She appears well-developed and well-nourished. No distress.  HENT:  Head: Normocephalic and atraumatic.  Eyes: Conjunctivae are normal. No scleral icterus.  Neck: Normal range of motion.  Cardiovascular: Normal rate, regular rhythm and normal heart sounds.  Exam reveals no gallop and no friction rub.   No murmur heard. Pulmonary/Chest: Effort normal and breath sounds normal. No respiratory distress.  Abdominal: Soft. Bowel sounds are normal. She  exhibits no distension and no mass. There is no tenderness. There is no guarding.  Neurological: She is alert and oriented to person, place, and time.  Skin: Skin is warm and dry. She is not diaphoretic.    ED Course  Procedures (including critical care time) Labs Review Labs Reviewed - No data to display  Imaging Review No results found.   EKG Interpretation None      MDM   Final diagnoses:  Ischemic foot  PVD (peripheral vascular disease)  Femoral-popliteal bypass graft occlusion, initial encounter   3:57 PM BP 149/47  Pulse 73  Temp(Src) 97.6 F (36.4 C) (Oral)  Resp 18  SpO2 100% Here with acute leg ischemia, no acute distress at this time. Her pain medications, basic labs. Dr. Scot Dock on the phone was on his way.  4:49 PM BP 141/56  Pulse 67  Temp(Src) 97.6 F (36.4 C) (Oral)  Resp 17  SpO2 100% Patient seen in emergency department by Dr. Scot Dock. Patient will go home to think about whether she wants an indication of the limb or not. Dr. Scot Dock asked that we discharge the patient with Keflex. She is stable throughout her course here in the ED. Labs appear at baseline.  patient has pain medication at home.   Margarita Mail, PA-C 09/09/13 1113

## 2013-09-08 NOTE — Assessment & Plan Note (Signed)
With home oxygen, stable

## 2013-09-08 NOTE — Assessment & Plan Note (Signed)
Followed by PCP

## 2013-09-08 NOTE — Assessment & Plan Note (Signed)
RT foot with pain, redness and no pedal or PT tib pulse, this has progressed in severity in last 1-2 weeks. Dr. Stanford Breed has discussed with Dr. Scot Dock and we are sending to ER.

## 2013-09-08 NOTE — Progress Notes (Signed)
Lower Extremity Arterial Duplex Completed. °Brianna L Mazza,RVT °

## 2013-09-15 ENCOUNTER — Telehealth: Payer: Self-pay

## 2013-09-15 DIAGNOSIS — M79643 Pain in unspecified hand: Secondary | ICD-10-CM

## 2013-09-15 NOTE — Telephone Encounter (Signed)
Patient is going out of town this weekend and is requesting refills on Cepalexin 500mg .  3 x day.   [facility] [employer]   [phone]

## 2013-09-16 NOTE — Telephone Encounter (Signed)
Called and spoke to daughter who advised they do NOT need the Abx. Pt called in request incorrectly. What she needs is more of the pain medication. Pt is getting ready to have leg amputated, but just needed a little more time to get ready for it emotionally. She has been using 3 tablets a day of the hydrocodone alternating w/Tyl, and will be have enough until Tues. Dr Everlene Farrier, can you RF the hydrocodone?

## 2013-09-16 NOTE — ED Provider Notes (Signed)
Medical screening examination/treatment/procedure(s) were performed by non-physician practitioner and as supervising physician I was immediately available for consultation/collaboration.   EKG Interpretation None        Tanna Furry, MD 09/16/13 [phone]

## 2013-09-17 NOTE — Telephone Encounter (Signed)
Yes please refill and I will sign

## 2013-09-20 ENCOUNTER — Other Ambulatory Visit: Payer: Self-pay | Admitting: Emergency Medicine

## 2013-09-20 DIAGNOSIS — M79643 Pain in unspecified hand: Secondary | ICD-10-CM

## 2013-09-20 MED ORDER — HYDROCODONE-ACETAMINOPHEN 7.5-325 MG PO TABS: ORAL_TABLET | ORAL | Status: DC

## 2013-09-21 NOTE — Telephone Encounter (Signed)
Script was printed 8/4. Spoke to pt- she states that her daughter will be picking it up today.

## 2013-09-22 ENCOUNTER — Other Ambulatory Visit: Payer: Self-pay

## 2013-09-26 ENCOUNTER — Encounter (HOSPITAL_COMMUNITY)
Admission: RE | Admit: 2013-09-26 | Discharge: 2013-09-26 | Disposition: A | Discharge status: Home / Self Care | Payer: Medicare Other | Source: Ambulatory Visit | Attending: Vascular Surgery | Admitting: Vascular Surgery

## 2013-09-26 ENCOUNTER — Encounter (HOSPITAL_COMMUNITY): Payer: Self-pay

## 2013-09-26 DIAGNOSIS — Z9981 Dependence on supplemental oxygen: Secondary | ICD-10-CM | POA: Insufficient documentation

## 2013-09-26 DIAGNOSIS — Z87891 Personal history of nicotine dependence: Secondary | ICD-10-CM

## 2013-09-26 DIAGNOSIS — J4489 Other specified chronic obstructive pulmonary disease: Secondary | ICD-10-CM | POA: Insufficient documentation

## 2013-09-26 DIAGNOSIS — N189 Chronic kidney disease, unspecified: Secondary | ICD-10-CM

## 2013-09-26 DIAGNOSIS — I739 Peripheral vascular disease, unspecified: Secondary | ICD-10-CM

## 2013-09-26 DIAGNOSIS — E785 Hyperlipidemia, unspecified: Secondary | ICD-10-CM | POA: Insufficient documentation

## 2013-09-26 DIAGNOSIS — I129 Hypertensive chronic kidney disease with stage 1 through stage 4 chronic kidney disease, or unspecified chronic kidney disease: Secondary | ICD-10-CM | POA: Insufficient documentation

## 2013-09-26 DIAGNOSIS — Z01812 Encounter for preprocedural laboratory examination: Secondary | ICD-10-CM

## 2013-09-26 DIAGNOSIS — J449 Chronic obstructive pulmonary disease, unspecified: Secondary | ICD-10-CM

## 2013-09-26 HISTORY — DX: Adverse effect of unspecified anesthetic, initial encounter: T41.45XA

## 2013-09-26 HISTORY — DX: Gastro-esophageal reflux disease without esophagitis: K21.9

## 2013-09-26 HISTORY — DX: Unspecified osteoarthritis, unspecified site: M19.90

## 2013-09-26 HISTORY — DX: Other complications of anesthesia, initial encounter: T88.59XA

## 2013-09-26 LAB — URINALYSIS, ROUTINE W REFLEX MICROSCOPIC
Bilirubin Urine: NEGATIVE
Glucose, UA: NEGATIVE mg/dL
Hgb urine dipstick: NEGATIVE
Ketones, ur: NEGATIVE mg/dL
Leukocytes, UA: NEGATIVE
Nitrite: NEGATIVE
PH: 5.5 (ref 5.0–8.0)
Protein, ur: 30 mg/dL — AB
SPECIFIC GRAVITY, URINE: 1.023 (ref 1.005–1.030)
Urobilinogen, UA: 0.2 mg/dL (ref 0.0–1.0)

## 2013-09-26 LAB — BASIC METABOLIC PANEL
ANION GAP: 17 — AB (ref 5–15)
BUN: 46 mg/dL — ABNORMAL HIGH (ref 6–23)
CO2: 22 meq/L (ref 19–32)
CREATININE: 2.16 mg/dL — AB (ref 0.50–1.10)
Calcium: 9.3 mg/dL (ref 8.4–10.5)
Chloride: 103 mEq/L (ref 96–112)
GFR calc Af Amer: 22 mL/min — ABNORMAL LOW (ref >90)
GFR calc non Af Amer: 19 mL/min — ABNORMAL LOW (ref >90)
GLUCOSE: 177 mg/dL — AB (ref 70–99)
Potassium: 4.1 mEq/L (ref 3.7–5.3)
SODIUM: 142 meq/L (ref 137–147)

## 2013-09-26 LAB — CBC
HEMATOCRIT: 31.7 % — AB (ref 36.0–46.0)
Hemoglobin: 9.9 g/dL — ABNORMAL LOW (ref 12.0–15.0)
MCH: 29.8 pg (ref 26.0–34.0)
MCHC: 31.2 g/dL (ref 30.0–36.0)
MCV: 95.5 fL (ref 78.0–100.0)
Platelets: 220 10*3/uL (ref 150–400)
RBC: 3.32 MIL/uL — ABNORMAL LOW (ref 3.87–5.11)
RDW: 14.2 % (ref 11.5–15.5)
WBC: 10.7 10*3/uL — ABNORMAL HIGH (ref 4.0–10.5)

## 2013-09-26 LAB — URINE MICROSCOPIC-ADD ON

## 2013-09-26 LAB — PROTIME-INR
INR: 1.06 (ref 0.00–1.49)
PROTHROMBIN TIME: 13.8 s (ref 11.6–15.2)

## 2013-09-26 LAB — APTT: aPTT: 31 seconds (ref 24–37)

## 2013-09-26 MED ORDER — CHLORHEXIDINE GLUCONATE 4 % EX LIQD
60.0000 mL | Freq: Once | CUTANEOUS | Status: DC
  Filled 2013-09-26: qty 60

## 2013-09-26 MED ORDER — SODIUM CHLORIDE 0.9 % IV SOLN
INTRAVENOUS | Status: DC
  Administered 2013-09-27: 10:00:00 via INTRAVENOUS

## 2013-09-26 MED ORDER — DEXTROSE 5 % IV SOLN
1.5000 g | INTRAVENOUS | Status: AC
  Administered 2013-09-27: 1.5 g via INTRAVENOUS
  Filled 2013-09-26: qty 1.5

## 2013-09-26 NOTE — Progress Notes (Signed)
Anesthesia Chart Review: Patient is a 80 year old female scheduled for right AKA tomorrow morning by Dr. [name].   History includes right femoral-anterior tibial BPG '99, "AAA" s/p AFBG who underwent left CFA endarterectomy with bovine patch angioplasty of aorta femoral bypass graft and left FPBG with GSV graft on 04/19/13.  Her right FTBG was known to be occluded since 06/2013, but only had claudication symptoms at that time. Unfortunately, her symptoms progressed and she was noted to have RLE erythema and toe discoloration at her recent cardiology visit on 09/08/13.  Dr. Stanford Breed contacted Dr. Scot Dock regarding her critical RLE ischemia with plans to send patient to the ED for revascularization versus amputation.  Patient did go to the ED, but was not ready to commit to a RLE amputation at that time and was discharged with antibiotics.  Other history includes former smoker, HLD, CAD, LV dysfunction '10 (EF 65-70% 03/2013), short term memory loss, COPD with home O2 2L/[place], HTN, renal artery stenosis (1-59% by doppler '14--according to notes), DM2, GERD, arthritis, anemia, pulmonary nodule (felt benign by 12/2009 CT), CKD (sees Dr. Erling Cruz). Primary cardiologist is Dr. Gwenlyn Found. PCP is listed as Dr. Arlyss Queen.  For anesthesia complications, she reported problems with BP and hard to wake up.  Echo on 03/31/13 showed: - Left ventricle: The cavity size was normal. Systolic function was vigorous. The estimated ejection fraction was in the range of 65% to 70%. Wall motion was normal; there were no regional wall motion abnormalities. Doppler parameters are consistent with abnormal left ventricular relaxation (grade 1 diastolic dysfunction). - Aortic valve: Valve area: 1.54cm^2(VTI). Valve area: 1.5cm^2 (Vmax). - Mitral valve: Calcified annulus. Mild regurgitation. Valve area by continuity equation (using LVOT flow): 1.97cm^2. - Left atrium: The atrium was mildly dilated. Anterior-posterior dimension:  70mm (2D). Impressions: No significant change from prior ECHO. EF is more vigorous on current study.  Nuclear stress test on 07/06/09 showed: Mild septal and inferoseptal hypokinesis with estimated quantitative ejection fraction of 40%. No evidence of ischemia.  Cardiac cath on 10/12/08 showed: NL LM, 30% LAD,30% CX & 40% tandem mid stenosis in the AV groove, 50-60% RCA, LVEF 40% with moderate global hypokinesia.   EKG on 09/08/13 showed: SR, left BBB.  LAD. Non-specific ST/T wave abnormality (high lateral leads).  QRS 124 ms, up from 118 ms on 03/24/13, otherwise stable.  Patient was evaluated by cardiology on the day of this EKG as mentioned above.  CXR 09/08/13 showed: No active cardiopulmonary disease.  Preoperative labs noted. BUN/Cr 46/2.16, stable since at least 2.67.  She does see a nephrologist for CKD but last office note received was from 06/2012. H/H 9.9/31.7, overall stable since 04/2013. I'll defer decision for T&S to the surgeon and/or anesthesiologist.  Patient with significant past medical history as outlined above.  She underwent vascular surgery earlier this year. She had recent cardiology evaluation who referred patient for urgent/emergent intervention however, she declined on 09/08/13.  She is now on the OR schedule for tomorrow morning.  If no acute changes then I would anticipate that she could proceed as planned.  George Hugh [facility] Short Stay Center/Anesthesiology Phone [phone] 09/26/2013 4:10 PM

## 2013-09-26 NOTE — Progress Notes (Signed)
req's notes from dr Erling Cruz asap.

## 2013-09-26 NOTE — Pre-Procedure Instructions (Addendum)
Suzanne [name]  09/26/2013   Your procedure is scheduled on:  09/27/13  Report to [facility] short stay admitting at 78 AM.  Call this number if you have problems the morning of surgery: [phone]   Remember:   Do not eat food or drink liquids after midnight.   Take these medicines the morning of surgery with A SIP OF WATER: nebulizer,inhalers,metoprolol,nexium, pain med as needed,  aspirin (unless stopped by dr)    Take all meds as ordered until day of surgery except as instructed below or per dr    Suzanne [name] all herbel meds, nsaids (aleve,naproxen,advil,ibuprofen) now including fish oil, vitamins     NO INSULIN DAY OF SURGERY   Do not wear jewelry, make-up or nail polish.  Do not wear lotions, powders, or perfumes. You may wear deodorant.  Do not shave 48 hours prior to surgery. Men may shave face and neck.  Do not bring valuables to the hospital.  [facility] is not responsible                  for any belongings or valuables.               Contacts, dentures or bridgework may not be worn into surgery.  Leave suitcase in the car. After surgery it may be brought to your room.  For patients admitted to the hospital, discharge time is determined by your                treatment team.               Patients discharged the day of surgery will not be allowed to drive  home.  Name and phone number of your driver:   Special Instructions:  Special Instructions:  - Preparing for Surgery  Before surgery, you can play an important role.  Because skin is not sterile, your skin needs to be as free of germs as possible.  You can reduce the number of germs on you skin by washing with CHG (chlorahexidine gluconate) soap before surgery.  CHG is an antiseptic cleaner which kills germs and bonds with the skin to continue killing germs even after washing.  Please DO NOT use if you have an allergy to CHG or antibacterial soaps.  If your skin becomes reddened/irritated stop using the CHG and  inform your nurse when you arrive at Short Stay.  Do not shave (including legs and underarms) for at least 48 hours prior to the first CHG shower.  You may shave your face.  Please follow these instructions carefully:   1.  Shower with CHG Soap the night before surgery and the morning of Surgery.  2.  If you choose to wash your hair, wash your hair first as usual with your normal shampoo.  3.  After you shampoo, rinse your hair and body thoroughly to remove the Shampoo.  4.  Use CHG as you would any other liquid soap.  You can apply chg directly  to the skin and wash gently with scrungie or a clean washcloth.  5.  Apply the CHG Soap to your body ONLY FROM THE NECK DOWN.  Do not use on open wounds or open sores.  Avoid contact with your eyes ears, mouth and genitals (private parts).  Wash genitals (private parts)       with your normal soap.  6.  Wash thoroughly, paying special attention to the area where your surgery will be performed.  7.  Thoroughly rinse your body with warm water from the neck down.  8.  DO NOT shower/wash with your normal soap after using and rinsing off the CHG Soap.  9.  Pat yourself dry with a clean towel.            10.  Wear clean pajamas.            11.  Place clean sheets on your bed the night of your first shower and do not sleep with pets.  Day of Surgery  Do not apply any lotions/deodorants the morning of surgery.  Please wear clean clothes to the hospital/surgery center.   Please read over the following fact sheets that you were given: Pain Booklet, Coughing and Deep Breathing and Surgical Site Infection Prevention

## 2013-09-27 ENCOUNTER — Encounter (HOSPITAL_COMMUNITY)
Admission: RE | Disposition: A | Discharge status: Skilled Nursing Facility | Payer: Self-pay | Source: Ambulatory Visit | Attending: Vascular Surgery

## 2013-09-27 ENCOUNTER — Inpatient Hospital Stay (HOSPITAL_COMMUNITY): Payer: Medicare Other | Admitting: Anesthesiology

## 2013-09-27 ENCOUNTER — Encounter (HOSPITAL_COMMUNITY): Payer: Medicare Other | Admitting: Vascular Surgery

## 2013-09-27 ENCOUNTER — Encounter (HOSPITAL_COMMUNITY): Payer: Self-pay | Admitting: Anesthesiology

## 2013-09-27 ENCOUNTER — Inpatient Hospital Stay (HOSPITAL_COMMUNITY)
Admission: RE | Admit: 2013-09-27 | Discharge: 2013-10-03 | DRG: 240 | Disposition: A | Discharge status: Skilled Nursing Facility | Payer: Medicare Other | Source: Ambulatory Visit | Attending: Vascular Surgery | Admitting: Vascular Surgery

## 2013-09-27 DIAGNOSIS — E119 Type 2 diabetes mellitus without complications: Secondary | ICD-10-CM | POA: Diagnosis present

## 2013-09-27 DIAGNOSIS — J9610 Chronic respiratory failure, unspecified whether with hypoxia or hypercapnia: Secondary | ICD-10-CM | POA: Diagnosis present

## 2013-09-27 DIAGNOSIS — I70409 Unspecified atherosclerosis of autologous vein bypass graft(s) of the extremities, unspecified extremity: Principal | ICD-10-CM | POA: Diagnosis present

## 2013-09-27 DIAGNOSIS — J4489 Other specified chronic obstructive pulmonary disease: Secondary | ICD-10-CM | POA: Diagnosis present

## 2013-09-27 DIAGNOSIS — I5032 Chronic diastolic (congestive) heart failure: Secondary | ICD-10-CM | POA: Diagnosis present

## 2013-09-27 DIAGNOSIS — L03119 Cellulitis of unspecified part of limb: Secondary | ICD-10-CM

## 2013-09-27 DIAGNOSIS — R413 Other amnesia: Secondary | ICD-10-CM | POA: Diagnosis present

## 2013-09-27 DIAGNOSIS — Z794 Long term (current) use of insulin: Secondary | ICD-10-CM | POA: Diagnosis not present

## 2013-09-27 DIAGNOSIS — I2510 Atherosclerotic heart disease of native coronary artery without angina pectoris: Secondary | ICD-10-CM | POA: Diagnosis present

## 2013-09-27 DIAGNOSIS — Z87891 Personal history of nicotine dependence: Secondary | ICD-10-CM | POA: Diagnosis not present

## 2013-09-27 DIAGNOSIS — L02419 Cutaneous abscess of limb, unspecified: Secondary | ICD-10-CM | POA: Diagnosis present

## 2013-09-27 DIAGNOSIS — R339 Retention of urine, unspecified: Secondary | ICD-10-CM | POA: Diagnosis not present

## 2013-09-27 DIAGNOSIS — I739 Peripheral vascular disease, unspecified: Secondary | ICD-10-CM | POA: Diagnosis present

## 2013-09-27 DIAGNOSIS — N182 Chronic kidney disease, stage 2 (mild): Secondary | ICD-10-CM | POA: Diagnosis present

## 2013-09-27 DIAGNOSIS — E782 Mixed hyperlipidemia: Secondary | ICD-10-CM | POA: Diagnosis present

## 2013-09-27 DIAGNOSIS — I7092 Chronic total occlusion of artery of the extremities: Secondary | ICD-10-CM | POA: Diagnosis present

## 2013-09-27 DIAGNOSIS — J449 Chronic obstructive pulmonary disease, unspecified: Secondary | ICD-10-CM | POA: Diagnosis present

## 2013-09-27 DIAGNOSIS — I509 Heart failure, unspecified: Secondary | ICD-10-CM | POA: Diagnosis present

## 2013-09-27 DIAGNOSIS — I70229 Atherosclerosis of native arteries of extremities with rest pain, unspecified extremity: Secondary | ICD-10-CM

## 2013-09-27 DIAGNOSIS — I129 Hypertensive chronic kidney disease with stage 1 through stage 4 chronic kidney disease, or unspecified chronic kidney disease: Secondary | ICD-10-CM | POA: Diagnosis present

## 2013-09-27 DIAGNOSIS — Z9981 Dependence on supplemental oxygen: Secondary | ICD-10-CM | POA: Diagnosis not present

## 2013-09-27 DIAGNOSIS — K219 Gastro-esophageal reflux disease without esophagitis: Secondary | ICD-10-CM | POA: Diagnosis present

## 2013-09-27 HISTORY — PX: ABOVE KNEE LEG AMPUTATION: SUR20

## 2013-09-27 HISTORY — DX: Dependence on supplemental oxygen: Z99.81

## 2013-09-27 HISTORY — PX: AMPUTATION: SHX166

## 2013-09-27 LAB — GLUCOSE, CAPILLARY
Glucose-Capillary: 102 mg/dL — ABNORMAL HIGH (ref 70–99)
Glucose-Capillary: 103 mg/dL — ABNORMAL HIGH (ref 70–99)
Glucose-Capillary: 112 mg/dL — ABNORMAL HIGH (ref 70–99)
Glucose-Capillary: 115 mg/dL — ABNORMAL HIGH (ref 70–99)

## 2013-09-27 SURGERY — AMPUTATION, ABOVE KNEE
Anesthesia: Spinal | Site: Leg Upper | Laterality: Right

## 2013-09-27 MED ORDER — STERILE WATER FOR INJECTION IJ SOLN
INTRAMUSCULAR | Status: AC
  Filled 2013-09-27: qty 10

## 2013-09-27 MED ORDER — GUAIFENESIN-DM 100-10 MG/5ML PO SYRP
15.0000 mL | ORAL_SOLUTION | ORAL | Status: DC | PRN
Start: 2013-09-27 — End: 2013-10-03

## 2013-09-27 MED ORDER — POLYETHYLENE GLYCOL 3350 17 G PO PACK
17.0000 g | PACK | Freq: Every day | ORAL | Status: DC | PRN
Start: 2013-09-27 — End: 2013-10-03
  Administered 2013-10-01: 17 g via ORAL
  Filled 2013-09-27 (×2): qty 1

## 2013-09-27 MED ORDER — BACITRACIN ZINC 500 UNIT/GM EX OINT
TOPICAL_OINTMENT | CUTANEOUS | Status: DC | PRN
  Administered 2013-09-27: 1 via TOPICAL

## 2013-09-27 MED ORDER — MAGNESIUM SULFATE 40 MG/ML IJ SOLN
2.0000 g | Freq: Every day | INTRAMUSCULAR | Status: DC | PRN
  Filled 2013-09-27: qty 50

## 2013-09-27 MED ORDER — DEXTROSE 5 % IV SOLN
1.5000 g | Freq: Two times a day (BID) | INTRAVENOUS | Status: DC
  Administered 2013-09-27: 1.5 g via INTRAVENOUS
  Filled 2013-09-27 (×2): qty 1.5

## 2013-09-27 MED ORDER — ALBUTEROL SULFATE (2.5 MG/3ML) 0.083% IN NEBU: 2.5000 mg | INHALATION_SOLUTION | Freq: Three times a day (TID) | RESPIRATORY_TRACT | Status: DC | PRN

## 2013-09-27 MED ORDER — SODIUM CHLORIDE 0.9 % IV SOLN
INTRAVENOUS | Status: DC | PRN
  Administered 2013-09-27: 10:00:00 via INTRAVENOUS

## 2013-09-27 MED ORDER — ASPIRIN EC 325 MG PO TBEC
325.0000 mg | DELAYED_RELEASE_TABLET | Freq: Every day | ORAL | Status: DC
  Administered 2013-09-28 – 2013-10-03 (×6): 325 mg via ORAL
  Filled 2013-09-27 (×7): qty 1

## 2013-09-27 MED ORDER — LIDOCAINE HCL (CARDIAC) 20 MG/ML IV SOLN
INTRAVENOUS | Status: AC
  Filled 2013-09-27: qty 5

## 2013-09-27 MED ORDER — ENOXAPARIN SODIUM 30 MG/0.3ML ~~LOC~~ SOLN
30.0000 mg | SUBCUTANEOUS | Status: DC
  Administered 2013-09-28 – 2013-10-03 (×6): 30 mg via SUBCUTANEOUS
  Filled 2013-09-27 (×8): qty 0.3

## 2013-09-27 MED ORDER — FENTANYL CITRATE 0.05 MG/ML IJ SOLN
50.0000 ug | Freq: Once | INTRAMUSCULAR | Status: AC
  Administered 2013-09-27: 50 ug via INTRAVENOUS

## 2013-09-27 MED ORDER — HYDROCODONE-ACETAMINOPHEN 7.5-325 MG PO TABS
1.0000 | ORAL_TABLET | ORAL | Status: DC | PRN
  Administered 2013-09-27 – 2013-10-01 (×9): 1 via ORAL
  Filled 2013-09-27 (×11): qty 1

## 2013-09-27 MED ORDER — BUDESONIDE-FORMOTEROL FUMARATE 160-4.5 MCG/ACT IN AERO
2.0000 | INHALATION_SPRAY | Freq: Two times a day (BID) | RESPIRATORY_TRACT | Status: DC
  Administered 2013-09-27 – 2013-10-03 (×12): 2 via RESPIRATORY_TRACT
  Filled 2013-09-27: qty 6

## 2013-09-27 MED ORDER — BUPIVACAINE IN DEXTROSE 0.75-8.25 % IT SOLN
INTRATHECAL | Status: DC | PRN
  Administered 2013-09-27: 1.6 mL via INTRATHECAL

## 2013-09-27 MED ORDER — ACETAMINOPHEN 500 MG PO TABS
1000.0000 mg | ORAL_TABLET | ORAL | Status: DC | PRN
  Administered 2013-10-02: 1000 mg via ORAL
  Filled 2013-09-27: qty 2

## 2013-09-27 MED ORDER — PHENOL 1.4 % MT LIQD
1.0000 | OROMUCOSAL | Status: DC | PRN
  Filled 2013-09-27: qty 177

## 2013-09-27 MED ORDER — EPHEDRINE SULFATE 50 MG/ML IJ SOLN
INTRAMUSCULAR | Status: AC
  Filled 2013-09-27: qty 1

## 2013-09-27 MED ORDER — 0.9 % SODIUM CHLORIDE (POUR BTL) OPTIME
TOPICAL | Status: DC | PRN
  Administered 2013-09-27: 1000 mL

## 2013-09-27 MED ORDER — SODIUM CHLORIDE 0.9 % IV SOLN: INTRAVENOUS | Status: DC

## 2013-09-27 MED ORDER — BACITRACIN ZINC 500 UNIT/GM EX OINT
TOPICAL_OINTMENT | CUTANEOUS | Status: AC
  Filled 2013-09-27: qty 15

## 2013-09-27 MED ORDER — INSULIN GLARGINE 100 UNIT/ML ~~LOC~~ SOLN
12.0000 [IU] | Freq: Every day | SUBCUTANEOUS | Status: DC
  Administered 2013-09-27 – 2013-10-02 (×6): 12 [IU] via SUBCUTANEOUS
  Filled 2013-09-27 (×8): qty 0.12

## 2013-09-27 MED ORDER — ONDANSETRON HCL 4 MG/2ML IJ SOLN
INTRAMUSCULAR | Status: DC | PRN
  Administered 2013-09-27: 4 mg via INTRAVENOUS

## 2013-09-27 MED ORDER — ONDANSETRON HCL 4 MG/2ML IJ SOLN
4.0000 mg | Freq: Four times a day (QID) | INTRAMUSCULAR | Status: DC | PRN
  Administered 2013-09-29 – 2013-10-01 (×2): 4 mg via INTRAVENOUS
  Filled 2013-09-27 (×2): qty 2

## 2013-09-27 MED ORDER — FENTANYL CITRATE 0.05 MG/ML IJ SOLN
INTRAMUSCULAR | Status: AC
  Filled 2013-09-27: qty 2

## 2013-09-27 MED ORDER — ALBUMIN HUMAN 5 % IV SOLN
12.5000 g | Freq: Once | INTRAVENOUS | Status: AC
  Administered 2013-09-27: 12.5 g via INTRAVENOUS

## 2013-09-27 MED ORDER — PANTOPRAZOLE SODIUM 40 MG PO TBEC
40.0000 mg | DELAYED_RELEASE_TABLET | Freq: Every day | ORAL | Status: DC
  Administered 2013-09-28 – 2013-10-03 (×6): 40 mg via ORAL
  Filled 2013-09-27 (×7): qty 1

## 2013-09-27 MED ORDER — ALBUMIN HUMAN 5 % IV SOLN
INTRAVENOUS | Status: AC
  Administered 2013-09-27: 12.5 g via INTRAVENOUS
  Filled 2013-09-27: qty 250

## 2013-09-27 MED ORDER — ROCURONIUM BROMIDE 50 MG/5ML IV SOLN
INTRAVENOUS | Status: AC
  Filled 2013-09-27: qty 1

## 2013-09-27 MED ORDER — METOPROLOL TARTRATE 1 MG/ML IV SOLN: 2.0000 mg | INTRAVENOUS | Status: DC | PRN

## 2013-09-27 MED ORDER — FUROSEMIDE 40 MG PO TABS
40.0000 mg | ORAL_TABLET | Freq: Every day | ORAL | Status: DC
  Administered 2013-09-28 – 2013-10-03 (×6): 40 mg via ORAL
  Filled 2013-09-27 (×7): qty 1

## 2013-09-27 MED ORDER — METOPROLOL SUCCINATE ER 25 MG PO TB24
25.0000 mg | ORAL_TABLET | Freq: Every day | ORAL | Status: DC
  Administered 2013-09-28 – 2013-10-03 (×6): 25 mg via ORAL
  Filled 2013-09-27 (×6): qty 1

## 2013-09-27 MED ORDER — ARTIFICIAL TEARS OP OINT
TOPICAL_OINTMENT | OPHTHALMIC | Status: AC
  Filled 2013-09-27: qty 3.5

## 2013-09-27 MED ORDER — LOSARTAN POTASSIUM 50 MG PO TABS
100.0000 mg | ORAL_TABLET | Freq: Every day | ORAL | Status: DC
  Administered 2013-09-30 – 2013-10-03 (×4): 100 mg via ORAL
  Filled 2013-09-27 (×7): qty 2

## 2013-09-27 MED ORDER — CEFUROXIME SODIUM 1.5 G IJ SOLR
1.5000 g | Freq: Two times a day (BID) | INTRAMUSCULAR | Status: AC
  Administered 2013-09-27 – 2013-09-28 (×2): 1.5 g via INTRAVENOUS
  Filled 2013-09-27 (×2): qty 1.5

## 2013-09-27 MED ORDER — EPHEDRINE SULFATE 50 MG/ML IJ SOLN
INTRAMUSCULAR | Status: DC | PRN
  Administered 2013-09-27: 5 mg via INTRAVENOUS

## 2013-09-27 MED ORDER — ONDANSETRON HCL 4 MG/2ML IJ SOLN
INTRAMUSCULAR | Status: AC
  Filled 2013-09-27: qty 2

## 2013-09-27 MED ORDER — POTASSIUM CHLORIDE CRYS ER 20 MEQ PO TBCR: 20.0000 meq | EXTENDED_RELEASE_TABLET | Freq: Every day | ORAL | Status: DC | PRN

## 2013-09-27 MED ORDER — IPRATROPIUM-ALBUTEROL 0.5-2.5 (3) MG/3ML IN SOLN: 3.0000 mL | Freq: Four times a day (QID) | RESPIRATORY_TRACT | Status: DC | PRN

## 2013-09-27 MED ORDER — PROPOFOL 10 MG/ML IV BOLUS
INTRAVENOUS | Status: AC
  Filled 2013-09-27: qty 20

## 2013-09-27 MED ORDER — FENTANYL CITRATE 0.05 MG/ML IJ SOLN
INTRAMUSCULAR | Status: AC
  Filled 2013-09-27: qty 5

## 2013-09-27 MED ORDER — MORPHINE SULFATE 2 MG/ML IJ SOLN
2.0000 mg | INTRAMUSCULAR | Status: DC | PRN
  Administered 2013-09-27 – 2013-10-01 (×16): 2 mg via INTRAVENOUS
  Filled 2013-09-27 (×16): qty 1

## 2013-09-27 MED ORDER — IPRATROPIUM-ALBUTEROL 20-100 MCG/ACT IN AERS: 2.0000 | INHALATION_SPRAY | Freq: Four times a day (QID) | RESPIRATORY_TRACT | Status: DC | PRN

## 2013-09-27 SURGICAL SUPPLY — 59 items
BANDAGE ELASTIC 4 VELCRO ST LF (GAUZE/BANDAGES/DRESSINGS) ×3 IMPLANT
BANDAGE ELASTIC 6 VELCRO ST LF (GAUZE/BANDAGES/DRESSINGS) ×3 IMPLANT
BANDAGE ESMARK 6X9 LF (GAUZE/BANDAGES/DRESSINGS) IMPLANT
BLADE 10 SAFETY STRL DISP (BLADE) ×3 IMPLANT
BLADE SAW RECIP 87.9 MT (BLADE) ×3 IMPLANT
BNDG CMPR 9X6 STRL LF SNTH (GAUZE/BANDAGES/DRESSINGS) ×1
BNDG COHESIVE 6X5 TAN STRL LF (GAUZE/BANDAGES/DRESSINGS) ×3 IMPLANT
BNDG ESMARK 6X9 LF (GAUZE/BANDAGES/DRESSINGS) ×3
BNDG GAUZE ELAST 4 BULKY (GAUZE/BANDAGES/DRESSINGS) ×3 IMPLANT
CANISTER SUCTION 2500CC (MISCELLANEOUS) ×3 IMPLANT
CLIP TI MEDIUM 6 (CLIP) IMPLANT
COVER SURGICAL LIGHT HANDLE (MISCELLANEOUS) ×3 IMPLANT
CUFF TOURNIQUET SINGLE 18IN (TOURNIQUET CUFF) IMPLANT
CUFF TOURNIQUET SINGLE 24IN (TOURNIQUET CUFF) IMPLANT
CUFF TOURNIQUET SINGLE 34IN LL (TOURNIQUET CUFF) ×2 IMPLANT
CUFF TOURNIQUET SINGLE 44IN (TOURNIQUET CUFF) IMPLANT
DRAIN CHANNEL 19F RND (DRAIN) IMPLANT
DRAPE ORTHO SPLIT 77X108 STRL (DRAPES) ×6
DRAPE PROXIMA HALF (DRAPES) ×3 IMPLANT
DRAPE SURG ORHT 6 SPLT 77X108 (DRAPES) ×2 IMPLANT
DRAPE U-SHAPE 47X51 STRL (DRAPES) ×3 IMPLANT
DRSG ADAPTIC 3X8 NADH LF (GAUZE/BANDAGES/DRESSINGS) ×3 IMPLANT
ELECT REM PT RETURN 9FT ADLT (ELECTROSURGICAL) ×3
ELECTRODE REM PT RTRN 9FT ADLT (ELECTROSURGICAL) ×1 IMPLANT
EVACUATOR SILICONE 100CC (DRAIN) IMPLANT
GAUZE SPONGE 4X4 12PLY STRL (GAUZE/BANDAGES/DRESSINGS) ×3 IMPLANT
GLOVE BIO SURGEON STRL SZ7.5 (GLOVE) ×3 IMPLANT
GLOVE BIOGEL PI IND STRL 6.5 (GLOVE) IMPLANT
GLOVE BIOGEL PI IND STRL 7.5 (GLOVE) IMPLANT
GLOVE BIOGEL PI IND STRL 8 (GLOVE) ×1 IMPLANT
GLOVE BIOGEL PI INDICATOR 6.5 (GLOVE) ×2
GLOVE BIOGEL PI INDICATOR 7.5 (GLOVE) ×2
GLOVE BIOGEL PI INDICATOR 8 (GLOVE) ×2
GLOVE ECLIPSE 7.5 STRL STRAW (GLOVE) ×2 IMPLANT
GLOVE SS BIOGEL STRL SZ 7 (GLOVE) ×1 IMPLANT
GLOVE SUPERSENSE BIOGEL SZ 7 (GLOVE) ×2
GOWN STRL REUS W/ TWL LRG LVL3 (GOWN DISPOSABLE) ×3 IMPLANT
GOWN STRL REUS W/TWL LRG LVL3 (GOWN DISPOSABLE) ×9
KIT BASIN OR (CUSTOM PROCEDURE TRAY) ×3 IMPLANT
KIT ROOM TURNOVER OR (KITS) ×3 IMPLANT
NS IRRIG 1000ML POUR BTL (IV SOLUTION) ×3 IMPLANT
PACK GENERAL/GYN (CUSTOM PROCEDURE TRAY) ×3 IMPLANT
PAD ARMBOARD 7.5X6 YLW CONV (MISCELLANEOUS) ×6 IMPLANT
PADDING CAST COTTON 6X4 STRL (CAST SUPPLIES) IMPLANT
SPONGE GAUZE 4X4 12PLY STER LF (GAUZE/BANDAGES/DRESSINGS) ×3 IMPLANT
STAPLER VISISTAT (STAPLE) ×3 IMPLANT
STOCKINETTE IMPERVIOUS LG (DRAPES) ×3 IMPLANT
SUT ETHILON 3 0 PS 1 (SUTURE) IMPLANT
SUT SILK 0 TIES 10X30 (SUTURE) ×3 IMPLANT
SUT SILK 2 0 (SUTURE) ×3
SUT SILK 2 0 SH CR/8 (SUTURE) ×3 IMPLANT
SUT SILK 2-0 18XBRD TIE 12 (SUTURE) ×1 IMPLANT
SUT SILK 3 0 (SUTURE) ×3
SUT SILK 3-0 18XBRD TIE 12 (SUTURE) ×1 IMPLANT
SUT VIC AB 2-0 CT1 18 (SUTURE) ×3 IMPLANT
TOWEL OR 17X24 6PK STRL BLUE (TOWEL DISPOSABLE) ×3 IMPLANT
TOWEL OR 17X26 10 PK STRL BLUE (TOWEL DISPOSABLE) ×3 IMPLANT
UNDERPAD 30X30 INCONTINENT (UNDERPADS AND DIAPERS) ×3 IMPLANT
WATER STERILE IRR 1000ML POUR (IV SOLUTION) ×3 IMPLANT

## 2013-09-27 NOTE — Anesthesia Preprocedure Evaluation (Signed)
Anesthesia Evaluation  Patient identified by MRN, date of birth, ID band Patient awake    Reviewed: Allergy & Precautions, H&P , NPO status , Patient's Chart, lab work & pertinent test results  Airway Mallampati: II TM Distance: >3 FB Neck ROM: Full    Dental  (+) Lower Dentures, Upper Dentures   Pulmonary shortness of breath and at rest, neg sleep apnea, COPD COPD inhaler and oxygen dependent, former smoker,    + decreased breath sounds      Cardiovascular hypertension, Pt. on home beta blockers and Pt. on medications + CAD, + Peripheral Vascular Disease and +CHF Rhythm:Regular     Neuro/Psych negative psych ROS   GI/Hepatic Neg liver ROS, GERD-  Medicated and Controlled,  Endo/Other  diabetes, Type 2, Insulin Dependent  Renal/GU CRF and Renal InsufficiencyRenal disease     Musculoskeletal   Abdominal   Peds  Hematology  (+) anemia ,   Anesthesia Other Findings History includes right femoral-anterior tibial BPG '99, "AAA" s/p AFBG who underwent left CFA endarterectomy with bovine patch angioplasty of aorta femoral bypass graft and left FPBG with GSV graft on 04/19/13.  Her right FTBG was known to be occluded since 06/2013, but only had claudication symptoms at that time. Unfortunately, her symptoms progressed and she was noted to have RLE erythema and toe discoloration at her recent cardiology visit on 09/08/13.  Dr. Stanford Breed contacted Dr. Scot Dock regarding her critical RLE ischemia with plans to send patient to the ED for revascularization versus amputation.  Patient did go to the ED, but was not ready to commit to a RLE amputation at that time and was discharged with antibiotics.  Other history includes former smoker, HLD, CAD, LV dysfunction '10 (EF 65-70% 03/2013), short term memory loss, COPD with home O2 2L/[place], HTN, renal artery stenosis (1-59% by doppler '14--according to notes), DM2, GERD, arthritis, anemia, pulmonary nodule  (felt benign by 12/2009 CT), CKD (sees Dr. Erling Cruz). Primary cardiologist is Dr. Gwenlyn Found. PCP is listed as Dr. Arlyss Queen.   For anesthesia complications, she reported problems with BP and hard to wake up.   Echo on 03/31/13 showed: - Left ventricle: The cavity size was normal. Systolic function was vigorous. The estimated ejection fraction was in the range of 65% to 70%. Wall motion was normal; there were no regional wall motion abnormalities. Doppler parameters are consistent with abnormal left ventricular relaxation (grade 1 diastolic dysfunction). - Aortic valve: Valve area: 1.54cm^2(VTI). Valve area: 1.5cm^2 (Vmax). - Mitral valve: Calcified annulus. Mild regurgitation. Valve area by continuity equation (using LVOT flow): 1.97cm^2. - Left atrium: The atrium was mildly dilated. Anterior-posterior dimension: 110mm (2D). Impressions: No significant change from prior ECHO. EF is more vigorous on current study.   Nuclear stress test on 07/06/09 showed: Mild septal and inferoseptal hypokinesis with estimated quantitative ejection fraction of 40%. No evidence of ischemia.   Cardiac cath on 10/12/08 showed: NL LM, 30% LAD,30% CX & 40% tandem mid stenosis in the AV groove, 50-60% RCA, LVEF 40% with moderate global hypokinesia.    EKG on 09/08/13 showed: SR, left BBB.  LAD. Non-specific ST/T wave abnormality (high lateral leads).  QRS 124 ms, up from 118 ms on 03/24/13, otherwise stable.  Patient was evaluated by cardiology on the day of this EKG as mentioned above.   CXR 09/08/13 showed: No active cardiopulmonary disease.   Preoperative labs noted. BUN/Cr 46/2.16, stable since at least 2.67.  She does see a nephrologist for CKD but last office note received  was from 06/2012. H/H 9.9/31.7, overall stable since 04/2013. I'll defer decision for T&S to the surgeon and/or anesthesiologist.   Patient with significant past medical history as outlined above.  She underwent vascular surgery earlier this  year. She had recent cardiology evaluation who referred patient for urgent/emergent intervention however, she declined on 09/08/13.  She is now on the OR schedule for tomorrow morning.  If no acute changes then I would anticipate that she could proceed as planned  Reproductive/Obstetrics                           Anesthesia Physical Anesthesia Plan  ASA: III  Anesthesia Plan: Spinal   Post-op Pain Management:    Induction:   Airway Management Planned: Natural Airway and Simple Face Mask  Additional Equipment: None  Intra-op Plan:   Post-operative Plan:   Informed Consent: I have reviewed the patients History and Physical, chart, labs and discussed the procedure including the risks, benefits and alternatives for the proposed anesthesia with the patient or authorized representative who has indicated his/her understanding and acceptance.     Plan Discussed with: CRNA and Surgeon  Anesthesia Plan Comments:         Anesthesia Quick Evaluation

## 2013-09-27 NOTE — Progress Notes (Signed)
Call to Dr. Ermalene Postin, reported pt. Pain score & that she took 1 hydrocodone this a.m., pt. Reports that it is not helping at all. New order furnished.

## 2013-09-27 NOTE — Transfer of Care (Signed)
Immediate Anesthesia Transfer of Care Note  Patient: Suzanne [name]  Procedure(s) Performed: Procedure(s): AMPUTATION ABOVE KNEE-RIGHT (Right)  Patient Location: PACU  Anesthesia Type:Spinal  Level of Consciousness: awake, alert  and oriented  Airway & Oxygen Therapy: Patient Spontanous Breathing and Patient connected to nasal cannula oxygen  Post-op Assessment: Report given to PACU RN and Post -op Vital signs reviewed and stable  Post vital signs: Reviewed and stable  Complications: No apparent anesthesia complications

## 2013-09-27 NOTE — Interval H&P Note (Signed)
The patient presents for right above-the-knee amputation today. I have previously discussed indications and potential complications including, but not limited to, bleeding, wound healing problems, and infection.

## 2013-09-27 NOTE — H&P (View-Only) (Signed)
[facility] [place]  Patient name: Suzanne [name] MRN: [id number] DOB: 1921-04-12 Sex: female  REASON FOR CONSULT: Ischemic right foot.  HPI: Suzanne [name] is a 80 y.o. female who had undergone a right femoral to anterior tibial artery bypass graft in 2009 by Dr. Drucie Opitz. She has ulcerative undergone previous aortobifemoral bypass grafting. In May he was noted that her right femoral tibial bypass graft is occluded it was not clear when that occurred. She does not have vein available in the right leg and there were very limited options for revascularization on the right. I last saw her on 07/20/2013. At that time the foot was viable and she had claudication but no rest pain. Since that time, she's had progressively increasing pain in the right foot. She was seen in Dr. Jacalyn Lefevre office today and felt to have significant ischemia of the right foot. She is sent to the emergency department for further evaluation.  Of note she's had a recent left femoropopliteal bypass grafting on the left and has done well from that standpoint with a palpable graft pulse and no issues on the left side. She has progressive rest pain of the right foot. She denies fever or chills. She denies any drainage from the foot.  Past Medical History  Diagnosis Date  . PVD (peripheral vascular disease)     a. PTA & stent right superficial artery PTA & left common iliac artery stenting. Right fem-pop bypass graft. b. Per Dr. Kennon Holter note: h/o aortobifemoral bypass grafting remotely as well as fem-peroneal bypass grafting.  . Mixed hyperlipidemia   . Compression fx, thoracic spine     T12  . Ischium fracture   . Memory loss, short term   . Pulmonary nodule   . CAD (coronary artery disease) 10/12/2008    a. 30% LAD,30% CX & 40% tandem mid stenosis in the AV groove, 50-60% RCA  . Chronic diastolic CHF (congestive heart failure) 09-2008    a. EF 40% by cath 2010. b. Improved - last echo 2015 with  normal EF.  [name] Anemia   . Detached retina 02-2005    R EYE  . Fracture 02-2007    L5 AND PELVIS  . Left ventricular dysfunction     a. EF 40% by cath 2010. b. Improved - last echo 2015 with normal EF.   [name] AAA (abdominal aortic aneurysm)     Repair unknown date  . Critical lower limb ischemia   . Chronic respiratory failure     a. On home O2.  [name] Hypertension   . Warfarin anticoagulation     DC summary from 2010 reports she is "on chronic  Coumadin for vascular disease." This is listed on her med list going back to 2006 by records available. Teaching service H&P remotely says she has h/o DVT but I cannot find any evidence of this. Admission H/P 03/2013 suggests history of atrial fib but all ECGs appear sinus and there is no mention of this previously.  [name] COPD (chronic obstructive pulmonary disease)   . Renal artery stenosis     a. 1-59% by doppler in 2014.  . Type II or unspecified type diabetes mellitus without mention of complication, not stated as uncontrolled    Family History  Problem Relation Age of Onset  . Tuberculosis Mother   . Cancer Father    SOCIAL HISTORY: History  Substance Use Topics  . Smoking status: Former Smoker    Types: Cigarettes  Quit date: 02/16/1997  . Smokeless tobacco: Never Used  . Alcohol Use: No   Allergies  Allergen Reactions  . Codeine Other (See Comments)    REACTION: hallucinations  . Hydrochlorothiazide Other (See Comments)    REACTION: hypotension  . Pregabalin Other (See Comments)    REACTION: unknown  . Other Rash    Adhesives and bandages gives pt a rash.   REVIEW OF SYSTEMS: Valu.Nieves ] denotes positive finding; [  ] denotes negative finding CARDIOVASCULAR:  [ ]  chest pain   [ ]  chest pressure   [ ]  palpitations   [ ]  orthopnea   Valu.Nieves ] dyspnea on exertion   [ ]  claudication   [ ]  rest pain   [ ]  DVT   [ ]  phlebitis PULMONARY:   [ ]  productive cough   [ ]  asthma   [ ]  wheezing NEUROLOGIC:   [ ]  weakness  Valu.Nieves ] paresthesias  [ ]  aphasia  [  ] amaurosis  [ ]  dizziness HEMATOLOGIC:   [ ]  bleeding problems   [ ]  clotting disorders MUSCULOSKELETAL:  Valu.Nieves ] joint pain   [ ]  joint swelling [ ]  leg swelling GASTROINTESTINAL: [ ]   blood in stool  [ ]   hematemesis GENITOURINARY:  [ ]   dysuria  [ ]   hematuria PSYCHIATRIC:  [ ]  history of major depression INTEGUMENTARY:  Valu.Nieves ] rashes  [ ]  ulcers CONSTITUTIONAL:  [ ]  fever   [ ]  chills  PHYSICAL EXAM: Filed Vitals:   09/08/13 1600 09/08/13 1615 09/08/13 1617 09/08/13 1707  BP: 141/57 141/56 141/56 119/56  Pulse: 67 72  68  Temp:    98.7 F (37.1 C)  TempSrc:    Oral  Resp: 17 12 17 16   SpO2: 98% 100% 100% 100%   There is no weight on file to calculate BMI. GENERAL: The patient is a well-nourished female, in no acute distress. The vital signs are documented above. CARDIOVASCULAR: There is a regular rate and rhythm. She has a right carotid bruit. She has palpable femoral pulses. On the right side, which is the symptomatic side, I cannot palpate popliteal pulse or pedal pulses. She does have a monophasic anterior tibial signal on the right with the Doppler. The foot appeared chronically ischemic. On the left side she has a palpable popliteal pulse. She has a palpable dorsalis pedis pulse. PULMONARY: There is good air exchange bilaterally without wheezing or rales. ABDOMEN: Soft and non-tender with normal pitched bowel sounds.  MUSCULOSKELETAL: she has chronic ischemic changes of the right foot with pallor. NEUROLOGIC: No focal weakness or paresthesias are detected. SKIN: She has cellulitis of the right leg. PSYCHIATRIC: The patient has a normal affect.  DATA:  Lab Results  Component Value Date   WBC 8.0 09/08/2013   HGB 10.4* 09/08/2013   HCT 33.4* 09/08/2013   MCV 95.4 09/08/2013   PLT 178 09/08/2013   Lab Results  Component Value Date   NA 144 09/08/2013   K 4.1 09/08/2013   CL 106 09/08/2013   CO2 23 09/08/2013   Lab Results  Component Value Date   CREATININE 2.02* 09/08/2013    Lab Results  Component Value Date   INR 1.05 09/08/2013   INR 1.05 04/18/2013   INR 1.15 04/04/2013   PROTIME 29.3* 08/01/2011   Lab Results  Component Value Date   HGBA1C 6.5 08/04/2013   MEDICAL ISSUES:  CHRONIC RIGHT LOWER EXTREMITY ISCHEMIA: This patient has a chronically occluded right femoral to tibial artery  bypass graft which was done with a prosthetic graft. I discussed with her previously that there were really no good long-term options for revascularization of the right lower extremity. She has no available vein for bypass. We discussed potentially obtaining an arteriogram if she wished to consider a redo bypass. However, given her age and medical comorbidities I thought this was a long shot and would be associated with significant risk. At this point given the progressive ischemia of her right lower extremity I would recommend right above-the-knee amputation. She has calf tenderness on the right and cellulitis extending up the leg and I think she would be at significant risk for not healing a below the knee amputation. At this point she does not wish to proceed with amputation and would like to think about this further understandably. She is concerned about her living situation if she proceeds with amputation. At this point is no evidence of significant sepsis and their main indications for amputation would be if her pain more not tolerable. The emergency department has given her a prescription for Keflex and she will be discharged and give this consideration at home with help of her family. If she wishes to proceed with amputation and certainly this can be scheduled. Again I would recommend above-the-knee amputation.  [facility] [place] Beeper: [phone]

## 2013-09-27 NOTE — Op Note (Signed)
    NAME: Suzanne [name]    MRN: [id number] DOB: Feb 12, 1922    DATE OF OPERATION: 09/27/2013  PREOP DIAGNOSIS: Ischemic right lower extremity  POSTOP DIAGNOSIS: same  PROCEDURE: right above-the-knee amputation  SURGEON: Judeth Cornfield. Scot Dock, MD, FACS  ASSIST: Gerri Lins PA  ANESTHESIA: spinal   EBL: minimal  INDICATIONS: Suzanne [name] is a 80 y.o. female who had multiple previous revascularization attempts of the right lower extremity. Most recently she had a prosthetic femoral-tibial bypass graft which failed and there were no further options for revascularization. She presents for primary above-the-knee amputation.  FINDINGS: the tissue was well perfused. There were no signs of infection.  TECHNIQUE: Patient was taken to the operating room and received a spinal anesthetic. The right lower extremity was prepped and draped in the usual sterile fashion. A fishmouth incision was marked. Tourniquet had been placed on the upper thigh. The leg was exsanguinated with an Esmarch bandage and the tourniquet inflated to 300 mm mercury. Under tourniquet control, the incision was carried down to the skin, subcutaneous tissue, fascia and muscle to the femur which was dissected free circumferentially. The periosteum was elevated. The bone was divided proximal to the level of skin division. The femoral artery and vein were individually suture ligated. The tourniquet was then released. Additional hemostasis was obtained using electrocautery and silk ties. The edges of the bone were rasped. The wound was irrigated with massive sailing. The fascial layer was closed with interrupted 2-0 Vicryl sutures. The skin was close with staples. Sterile dressing was applied. The patient tolerated the procedure well and transferred to the recovery room in stable condition. All needle and sponge counts were correct.  [name], MD, FACS [facility]  DATE OF DICTATION:    09/27/2013

## 2013-09-27 NOTE — Consult Note (Signed)
Physical Medicine and Rehabilitation Consult  Reason for Consult: R-AKA due to ischemic RLE/PVD Referring Physician: Dr. Scot Dock   HPI: Suzanne [name] is a 80 y.o. female with history of COPD- oxygen at nights, CKD, PVD with recent L-CFA endarectomy angioplasty of aortofemoral bypass graft with  L-FPBG 3/15 who developed occlusion of her R- FTBG 5/15 with claudication but was noted to have progression of symptoms with ischemia RLE on evaluation by MD on 09/08/13.  She declined R-AKA but has had increase rest pain with numbness. She elected to undergo R-AKA on 09/27/12 by Dr. Scot Dock. Post op with urinary retention requiring in and out cath. PT/OT evaluations pending.   Patient sat at edge of bed Has been some sensation but not tandem pain. Has severe stump pain  Review of Systems  Unable to perform ROS: other  Respiratory: Positive for shortness of breath.   Cardiovascular: Negative for chest pain.  Musculoskeletal:       Pain R-AKA     Past Medical History  Diagnosis Date  . PVD (peripheral vascular disease)     a. PTA & stent right superficial artery PTA & left common iliac artery stenting. Right fem-pop bypass graft. b. Per Dr. Kennon Holter note: h/o aortobifemoral bypass grafting remotely as well as fem-peroneal bypass grafting.  . Mixed hyperlipidemia   . Compression fx, thoracic spine     T12  . Ischium fracture   . Memory loss, short term   . Pulmonary nodule   . Chronic diastolic CHF (congestive heart failure) 09-2008    a. EF 40% by cath 2010. b. Improved - last echo 2015 with normal EF.  [name] Anemia   . Detached retina 02-2005    R EYE  . Fracture 02-2007    L5 AND PELVIS  . Left ventricular dysfunction     a. EF 40% by cath 2010. b. Improved - last echo 2015 with normal EF.   [name] AAA (abdominal aortic aneurysm)     Repair unknown date  . Critical lower limb ischemia   . Chronic respiratory failure     a. On home O2.  [name] Hypertension   . Warfarin anticoagulation      DC summary from 2010 reports she is "on chronic  Coumadin for vascular disease." This is listed on her med list going back to 2006 by records available. Teaching service H&P remotely says she has h/o DVT but I cannot find any evidence of this. Admission H/P 03/2013 suggests history of atrial fib but all ECGs appear sinus and there is no mention of this previously.  [name] COPD (chronic obstructive pulmonary disease)   . Type II or unspecified type diabetes mellitus without mention of complication, not stated as uncontrolled   . Complication of anesthesia     hard to wake up, bp problems   . Pneumonia     hx  . GERD (gastroesophageal reflux disease)   . Arthritis   . CAD (coronary artery disease) 10/12/2008    a. 30% LAD,30% CX & 40% tandem mid stenosis in the AV groove, 50-60% RCA  . Renal artery stenosis     a. 1-59% by doppler in 2014.; CKD stage III (Dr. Erling Cruz)    Past Surgical History  Procedure Laterality Date  . Aorta surgery  98    aortic anyuersm surgery.   . Vessel surgery      S/P aortobifemoral bypass grafting and right fem-peroneal bypass grafting remotely  . Cholecystectomy    .  Cataract surgery  06-2006    R EYE  . Vascular surgery    . Cardiac catheterization    . Eye surgery      detached retina  (r)  . Eye surgery      cataract  OD  . Patch angioplasty Left 04/19/2013    Procedure: VEIN PATCH ANGIOPLASTY - LEFT COMMON FEMORAL ARTERY;  Surgeon: Angelia Mould, MD;  Location: [place];  Service: Vascular;  Laterality: Left;  . Femoral-popliteal bypass graft Left 04/19/2013    Procedure: BYPASS GRAFT FEMORAL- BELOW KNEE POPLITEAL ARTERY WITH VEIN;  Surgeon: Angelia Mould, MD;  Location: [place];  Service: Vascular;  Laterality: Left;  . Fracture surgery      sternum  2008 no surgery    Family History  Problem Relation Age of Onset  . Tuberculosis Mother   . Cancer Father     Social History:  Lives alone. Reports that daughter has been "coming over  frequently" to help with meals and housework. Limited mobility due to pain past few weeks. Per reports that she quit smoking about 16 years ago. Her smoking use included Cigarettes. She has a 80 pack-year smoking history. She has never used smokeless tobacco. Per reports that she does not drink alcohol or use illicit drugs.   Allergies  Allergen Reactions  . Codeine Other (See Comments)    REACTION: hallucinations  . Hydrochlorothiazide Other (See Comments)    REACTION: hypotension  . Pregabalin Other (See Comments)    REACTION: unknown  . Other Rash    Adhesives and bandages gives pt a rash.   Medications Prior to Admission  Medication Sig Dispense Refill  . acetaminophen (TYLENOL) 500 MG tablet Take 1,000 mg by mouth every 4 (four) hours as needed for moderate pain.       [name] albuterol (PROVENTIL) (2.5 MG/3ML) 0.083% nebulizer solution Take 2.5 mg by nebulization 3 (three) times daily as needed for wheezing or shortness of breath.       [name] aspirin EC 325 MG EC tablet Take 1 tablet (325 mg total) by mouth daily.      . budesonide-formoterol (SYMBICORT) 160-4.5 MCG/ACT inhaler Inhale 2 puffs into the lungs 2 (two) times daily.      . Calcium Carb-Cholecalciferol (CALCIUM 600 + D PO) Take 1 tablet by mouth daily.      [name] esomeprazole (NEXIUM) 40 MG capsule Take 40 mg by mouth daily before breakfast.      . furosemide (LASIX) 40 MG tablet Take 40 mg by mouth daily.      [name] HYDROcodone-acetaminophen (NORCO) 7.5-325 MG per tablet Take 1 tablet by mouth every 4 (four) hours as needed. Take one half tablet as needed for pain every 6 hours      . ibandronate (BONIVA) 150 MG tablet Take 150 mg by mouth every 30 (thirty) days. Take in the morning with a full glass of water, on an empty stomach, and do not take anything else by mouth or lie down for the next 30 min. (take on the 25th of each month)      . insulin glargine (LANTUS) 100 UNIT/ML injection Inject 12 Units into the skin at bedtime.       .  Ipratropium-Albuterol (COMBIVENT RESPIMAT) 20-100 MCG/ACT AERS respimat Inhale 2 puffs into the lungs every 6 (six) hours as needed for wheezing or shortness of breath.       . lidocaine (LIDODERM) 5 % Place 1 patch onto the skin daily. Remove &  Discard patch within 12 hours or as directed by MD      . losartan (COZAAR) 100 MG tablet Take 100 mg by mouth daily.      . metoprolol succinate (TOPROL-XL) 25 MG 24 hr tablet Take 25 mg by mouth daily.       . Omega-3 Fatty Acids (FISH OIL) 1200 MG CAPS Take 1,200 mg by mouth 2 (two) times daily.      . ranitidine (ZANTAC) 150 MG tablet Take 150 mg by mouth 2 (two) times daily.      . rosuvastatin (CRESTOR) 20 MG tablet Take 20 mg by mouth daily.         Home:    Functional History:   Functional Status:  Mobility:          ADL:    Cognition: Cognition Orientation Level: Oriented X4    Blood pressure 125/37, pulse 62, temperature 97.9 F (36.6 C), temperature source Oral, resp. rate 12, height 5\' 2"  (1.575 m), weight 59.875 kg (132 lb), SpO2 100.00%. Physical Exam  Nursing note and vitals reviewed. Constitutional: She is oriented to person, place, and time. She appears well-developed and well-nourished. Nasal cannula in place.  Elderly female, anxious and easily agitated. Focused on increased pain R-AKA this am. Difficulty to redirect.   HENT:  Head: Normocephalic and atraumatic.  Eyes: Conjunctivae are normal. Pupils are equal, round, and reactive to light.  Neck: Normal range of motion. Neck supple.  Cardiovascular: Normal rate and regular rhythm.   Respiratory: Effort normal and breath sounds normal.  GI: Soft. Bowel sounds are normal. She exhibits no distension. There is no tenderness.  Musculoskeletal: She exhibits no edema.  Left great toe with scab (missing toenail). Foot with dry flaky skin and left heel boggy with mild tenderness.   Neurological: She is alert and oriented to person, place, and time.  Has poor insight and  awareness of deficits related to surgery. Movers BUE and LLE without difficulty.   Skin: Skin is warm and dry.   motor strength 5/5 bilateral deltoid, bicep, tricep, grip 3 minus left hip flexion 4 minus knee extension 4 minus left ankle dorsiflexion plantar flexion Right stump wrapped with ace wrap appropriately tender Results for orders placed during the hospital encounter of 09/27/13 (from the past 24 hour(s))  GLUCOSE, CAPILLARY     Status: Abnormal   Collection Time    09/27/13  8:14 AM      Result Value Ref Range   Glucose-Capillary 102 (*) 70 - 99 mg/dL  GLUCOSE, CAPILLARY     Status: Abnormal   Collection Time    09/27/13 12:05 PM      Result Value Ref Range   Glucose-Capillary 103 (*) 70 - 99 mg/dL   Comment 1 Documented in Chart     Comment 2 Notify RN     No results found.  Assessment/Plan: Diagnosis: Right above knee amputation postoperative day #1, severe peripheral vascular disease 1. Does the need for close, 24 hr/day medical supervision in concert with the patient's rehab needs make it unreasonable for this patient to be served in a less intensive setting? Yes 2. Co-Morbidities requiring supervision/potential complications: CHF, COPD, chronic kidney disease, type 2 diabetes 3. Due to bladder management, bowel management, safety, skin/wound care, disease management, medication administration, pain management and patient education, does the patient require 24 hr/day rehab nursing? Yes 4. Does the patient require coordinated care of a physician, rehab nurse, PT (1-2 hrs/day, 5 days/week) and OT (1-21-2 hrs/day,  5 days/week) to address physical and functional deficits in the context of the above medical diagnosis(es)? Yes Addressing deficits in the following areas: balance, endurance, locomotion, strength, transferring, bowel/bladder control, bathing, dressing and toileting 5. Can the patient actively participate in an intensive therapy program of at least 3 hrs of therapy per  day at least 5 days per week? Potentially 6. The potential for patient to make measurable gains while on inpatient rehab is good 7. Anticipated functional outcomes upon discharge from inpatient rehab are modified independent and supervision  with PT, modified independent and supervision with OT, n/a with SLP. 8. Estimated rehab length of stay to reach the above functional goals is: 7-10 days 9. Does the patient have adequate social supports to accommodate these discharge functional goals? Potentially 10. Anticipated D/C setting: Home 11. Anticipated post D/C treatments: [facility] therapy 12. Overall Rehab/Functional Prognosis: good  RECOMMENDATIONS: This patient's condition is appropriate for continued rehabilitative care in the following setting: CIR Patient has agreed to participate in recommended program. Yes Note that insurance prior authorization may be required for reimbursement for recommended care.  Comment: Patient with limited assistance at home    09/27/2013

## 2013-09-27 NOTE — Progress Notes (Signed)
PT Cancellation Note  Patient Details Name: Suzanne [name] MRN: [id number] DOB: 1921-11-18   Cancelled Treatment:    Reason Eval/Treat Not Completed: Pain limiting ability to participate. Pt reports she just received pain medication 30 minutes ago and it has not "kicked in" yet. Pt declined to answer history questions, and declined offer to change positioning in bed to alleviate pain. Will continue to follow and eval when able.    Jolyn Lent 09/27/2013, 3:49 PM  Jolyn Lent, PT, DPT Acute Rehabilitation Services Pager: [phone]

## 2013-09-27 NOTE — Anesthesia Postprocedure Evaluation (Signed)
  Anesthesia Post-op Note  Patient: Suzanne [name]  Procedure(s) Performed: Procedure(s): AMPUTATION ABOVE KNEE-RIGHT (Right)  Patient Location: PACU  Anesthesia Type:Spinal  Level of Consciousness: awake  Airway and Oxygen Therapy: Patient Spontanous Breathing and Patient connected to nasal cannula oxygen  Post-op Pain: none  Post-op Assessment: Post-op Vital signs reviewed, Patient's Cardiovascular Status Stable, Respiratory Function Stable, Patent Airway, No signs of Nausea or vomiting and Pain level controlled  Post-op Vital Signs: Reviewed and stable  Last Vitals:  Filed Vitals:   09/27/13 1315  BP: 125/37  Pulse: 62  Temp: 36.6 C  Resp: 12    Complications: No apparent anesthesia complications

## 2013-09-27 NOTE — Anesthesia Procedure Notes (Addendum)
Spinal  Patient location during procedure: OR Start time: 09/27/2013 10:41 AM End time: 09/27/2013 11:02 AM Staffing Anesthesiologist: MOSER, CHRIS Preanesthetic Checklist Completed: patient identified, surgical consent, pre-op evaluation, timeout performed, IV checked, risks and benefits discussed and monitors and equipment checked Spinal Block Patient position: sitting Prep: site prepped and draped and DuraPrep Patient monitoring: heart rate, cardiac monitor, continuous pulse ox and blood pressure Approach: right paramedian Location: L4-5 Injection technique: single-shot Needle Needle type: Sprotte  Needle gauge: 22 G Needle length: 10 cm Assessment Sensory level: T4 Additional Notes After multiple attempted passes midline converted to paramedian approach which was successful on initial pass  Procedure Name: MAC Date/Time: 09/27/2013 10:20 AM Performed by: Storm Frisk E Pre-anesthesia Checklist: Patient identified, Timeout performed, Emergency Drugs available, Suction available and Patient being monitored Patient Re-evaluated:Patient Re-evaluated prior to inductionOxygen Delivery Method: Simple face mask

## 2013-09-28 DIAGNOSIS — I739 Peripheral vascular disease, unspecified: Secondary | ICD-10-CM

## 2013-09-28 DIAGNOSIS — L98499 Non-pressure chronic ulcer of skin of other sites with unspecified severity: Secondary | ICD-10-CM

## 2013-09-28 DIAGNOSIS — S78119A Complete traumatic amputation at level between unspecified hip and knee, initial encounter: Secondary | ICD-10-CM

## 2013-09-28 LAB — BASIC METABOLIC PANEL
ANION GAP: 14 (ref 5–15)
BUN: 38 mg/dL — ABNORMAL HIGH (ref 6–23)
CO2: 24 mEq/L (ref 19–32)
Calcium: 8.9 mg/dL (ref 8.4–10.5)
Chloride: 105 mEq/L (ref 96–112)
Creatinine, Ser: 1.76 mg/dL — ABNORMAL HIGH (ref 0.50–1.10)
GFR calc non Af Amer: 24 mL/min — ABNORMAL LOW (ref >90)
GFR, EST AFRICAN AMERICAN: 28 mL/min — AB (ref >90)
GLUCOSE: 110 mg/dL — AB (ref 70–99)
POTASSIUM: 4.6 meq/L (ref 3.7–5.3)
Sodium: 143 mEq/L (ref 137–147)

## 2013-09-28 LAB — CBC
HCT: 26.6 % — ABNORMAL LOW (ref 36.0–46.0)
Hemoglobin: 8.6 g/dL — ABNORMAL LOW (ref 12.0–15.0)
MCH: 31.3 pg (ref 26.0–34.0)
MCHC: 32.3 g/dL (ref 30.0–36.0)
MCV: 96.7 fL (ref 78.0–100.0)
PLATELETS: 195 10*3/uL (ref 150–400)
RBC: 2.75 MIL/uL — AB (ref 3.87–5.11)
RDW: 13.9 % (ref 11.5–15.5)
WBC: 7.8 10*3/uL (ref 4.0–10.5)

## 2013-09-28 LAB — GLUCOSE, CAPILLARY
GLUCOSE-CAPILLARY: 116 mg/dL — AB (ref 70–99)
GLUCOSE-CAPILLARY: 152 mg/dL — AB (ref 70–99)
Glucose-Capillary: 124 mg/dL — ABNORMAL HIGH (ref 70–99)
Glucose-Capillary: 154 mg/dL — ABNORMAL HIGH (ref 70–99)

## 2013-09-28 NOTE — Progress Notes (Signed)
Occupational Therapy Treatment Patient Details Name: Suzanne [name] MRN: [id number] DOB: 10/10/1921 Today's Date: 09/28/2013    History of present illness Pt s/p R AKA 8/11. H/o L LE fem pop 04/2013.   OT comments  Pt progressing toward goals at this time. Pt sitting on BSC and voiding bladder. Pt repositioned in bed with pain decr repositioned.    Follow Up Recommendations  SNF    Equipment Recommendations  Other (comment) (defer snf)    Recommendations for Other Services      Precautions / Restrictions Precautions Precautions: Fall Precaution Comments: R AKA Restrictions RLE Weight Bearing: Non weight bearing       Mobility Bed Mobility Overal bed mobility: Needs Assistance Bed Mobility: Sit to Supine Rolling: Max assist Sidelying to sit: Max assist Supine to sit: Mod assist;HOB elevated Sit to supine: Max assist   General bed mobility comments: pt using bil UE to help pivot UB around on bed surface. Pt requried (A) for LT LE and sliding into midline  Transfers Overall transfer level: Needs assistance Equipment used: 2 person hand held assist Transfers: Sit to/from [employer] Transfers Sit to Stand: +2 physical assistance;Max assist Stand pivot transfers: +2 physical assistance;Max assist       General transfer comment: (A) to facilitate weight shift and hip rotation     Balance Overall balance assessment: Needs assistance Sitting-balance support: Bilateral upper extremity supported;Feet supported Sitting balance-Leahy Scale: Fair     Standing balance support: Bilateral upper extremity supported;During functional activity Standing balance-Leahy Scale: Poor                     ADL Overall ADL's : Needs assistance/impaired Eating/Feeding: Set up;Sitting   Grooming: Wash/dry face;Oral care;Min guard;Sitting   Upper Body Bathing: Minimal assitance;Sitting   Lower Body Bathing: Total assistance;Sit to/from stand   Upper Body Dressing :  Minimal assistance   Lower Body Dressing: Total assistance;Sit to/from stand   Toilet Transfer: +2 for physical assistance;Moderate assistance;Stand-pivot;BSC Toilet Transfer Details (indicate cue type and reason): Requires (A) to facilitation rotation to [place] Manipulation and Hygiene: +2 for physical assistance;Total assistance Toileting - Clothing Manipulation Details (indicate cue type and reason): unable to complete peri care in sittnig position. Pt attempting. pt required standing static with total (A)       General ADL Comments: Pt in chair on arrival. pt transferred from chair to [facility]. Pt voiding bladder on BSC. RN requesting OT attempt transfer due to pending bladder scan needed. Pt with large void. Pt transferred [facility] to bed level. Pt repositioned. Pt educated on RT AKA extension and abduction / adduction exercises. Daughte rpresent during session and requesting SNF for incr length of stay required      Vision                     Perception     Praxis      Cognition   Behavior During Therapy: Anxious Overall Cognitive Status: Impaired/Different from baseline Area of Impairment: Memory     Memory: Decreased short-term memory          General Comments: Pt calling out with pain without any movement or tactile input in anticipation of mobility. Pt with poor recall of sequence during session    Extremity/Trunk Assessment  Upper Extremity Assessment Upper Extremity Assessment: Overall WFL for tasks assessed   Lower Extremity Assessment Lower Extremity Assessment: Defer to PT evaluation   Cervical / Trunk Assessment Cervical / Trunk  Assessment: Kyphotic    Exercises     Shoulder Instructions       General Comments      Pertinent Vitals/ Pain       Pain Assessment: Faces Pain Score: 6  Faces Pain Scale: Hurts even more Pain Location: Rt AKA Pain Descriptors / Indicators: Constant Pain Intervention(s): Premedicated before  session;Repositioned  Home Living Family/patient expects to be discharged to:: Skilled nursing facility                                 Additional Comments: family present and requesting SNF for d/c after discussion with CIR coordinator      Prior Functioning/Environment Level of Independence: Independent        Comments: pt reports using walker recently   Frequency Min 2X/week     Progress Toward Goals  OT Goals(current goals can now be found in the care plan section)  Progress towards OT goals: Progressing toward goals  Acute Rehab OT Goals Patient Stated Goal: to make the pain stop OT Goal Formulation: With patient Time For Goal Achievement: 10/12/13 Potential to Achieve Goals: Good ADL Goals Pt Will Perform Grooming: with set-up;sitting Pt Will Perform Upper Body Bathing: with set-up;sitting Pt Will Perform Lower Body Bathing: with min assist;sit to/from stand Pt Will Transfer to Toilet: with mod assist;bedside commode Additional ADL Goal #1: Pt will participate in RT AKA ace wrapping at mOd (A) level  Plan Discharge plan remains appropriate    Co-evaluation                 End of Session Equipment Utilized During Treatment: Gait belt   Activity Tolerance Patient tolerated treatment well   Patient Left with call bell/phone within reach;in bed;with family/visitor present   Nurse Communication Mobility status;Precautions        Time: 3704-8889 OT Time Calculation (min): 23 min  Charges: OT General Charges $OT Visit: 1 Procedure OT Evaluation $Initial OT Evaluation Tier I: 1 Procedure OT Treatments $Self Care/Home Management : 23-37 mins  Peri Maris 09/28/2013, 1:52 PM Pager: [phone]

## 2013-09-28 NOTE — Clinical Documentation Improvement (Signed)
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 Went to OR for an Above the Knee Amputation.  I have two queries for you:   1. Patient's Creatinine was 2.16 on admit and today's was 1.76; a drop of 0.40 in 2 day time frame.   Please clarify if Acute Renal Failure ruled in or out or if other condition   2. CKD is documented in Pre Op Anesthesia note. Patient is a white female with GFR's ranging from 19 to 24 this admission.  Please clarify the stage of CKD the patient has using the chart below and render an opinion of in next progress note and transfer to discharge summary if applicable.  _______CKD Stage I - GFR > OR = 90 _______CKD Stage II - GFR 60-80 _______CKD Stage III - GFR 30-59 _______CKD Stage IV - GFR 15-29 _______CKD Stage V - GFR < 15 _______ESRD (End Stage Renal Disease) _______Other condition_____________   Angela Adam ,RN Clinical Documentation Specialist:  [phone]  [place] Information Management

## 2013-09-28 NOTE — Progress Notes (Signed)
UR complete.  Benicio Manna RN, MSN 

## 2013-09-28 NOTE — Care Management Note (Signed)
    Page 1 of 1   10/03/2013     4:42:36 PM CARE MANAGEMENT NOTE 10/03/2013  Patient:  Suzanne [name], Suzanne [name]   Account Number:  [id number]  Date Initiated:  09/28/2013  Documentation initiated by:  Lorne Skeens  Subjective/Objective Assessment:   Patient was admitted for a right above knee amputation. Lives at home alone.     Action/Plan:   Will follow for discharge needs.   Anticipated DC Date:  10/03/2013   Anticipated DC Plan:  SKILLED NURSING FACILITY  In-house referral  Clinical Social Worker      DC Planning Services  CM consult      Choice offered to / List presented to:             Status of service:  Completed, signed off Medicare Important Message given?  YES (If response is "NO", the following Medicare IM given date fields will be blank) Date Medicare IM given:  09/30/2013 Medicare IM given by:  [facility] Date Additional Medicare IM given:  10/03/2013 Additional Medicare IM given by:  Duvid Smalls  Discharge Disposition:  [place]  Per UR Regulation:  Reviewed for med. necessity/level of care/duration of stay  If discussed at [place] of Stay Meetings, dates discussed:    Comments:  10/03/13 Ellan Lambert, Rn, Bsn Pt discharged to SNF today, per CSW arrangements.   09/28/13 Ellan Lambert, RN, BSN [phone] Pt/family requests dc to [facility] SNF at dc; [place] consulted to facilitate dc to SNF when medically stable for dc.

## 2013-09-28 NOTE — Evaluation (Signed)
Physical Therapy Evaluation Patient Details Name: Suzanne [name] MRN: [id number] DOB: November 23, 1921 Today's Date: 09/28/2013   History of Present Illness  Pt s/p R AKA 8/11. H/o L LE fem pop 04/2013.  Clinical Impression  Pt admitted with above. Pt extremely anxious regarding onset of pain in R LE. Pt required max encouragement and calming techniques to agree to sit EOB. Pt did tolerate sitting EOB well. Spoke with OT regarding attempting to get pt OOB this date with approriate assist. If pt not accepted at CIR pt will need SNF to achieve safe mod I function for safe transition home.    Follow Up Recommendations CIR (SNF if doesn't get accepted to CIR)    Equipment Recommendations   (TBD)    Recommendations for Other Services Rehab consult     Precautions / Restrictions Precautions Precautions: Fall Precaution Comments: R AKA Restrictions Weight Bearing Restrictions: Yes RLE Weight Bearing: Non weight bearing      Mobility  Bed Mobility Overal bed mobility: Needs Assistance Bed Mobility: Supine to Sit;Sit to Supine     Supine to sit: Max assist Sit to supine: Min assist   General bed mobility comments: maxA for trunk elevation, with max diretional v/c's pt able to complete bridge and scoot hips towards EOB  Transfers                    Ambulation/Gait                Stairs            Wheelchair Mobility    Modified Rankin (Stroke Patients Only)       Balance Overall balance assessment: Needs assistance Sitting-balance support: Single extremity supported Sitting balance-Leahy Scale: Poor Sitting balance - Comments: pt sat EOB x 10 min and complete L LE ther ex and discussed d/c planning. Pt able to maintain sitting balance with R UE support                                     Pertinent Vitals/Pain Pain Assessment: 0-10 Pain Score: 8  Pain Location: R residual limb Pain Descriptors / Indicators: Pins and needles (phantom  limb pain) Pain Intervention(s): Monitored during session (decreased to 3/10 sitting EOB)    Home Living Family/patient expects to be discharged to:: Unsure Living Arrangements: Alone               Additional Comments: CIR vs SNF    Prior Function Level of Independence: Independent               Hand Dominance   Dominant Hand: Right    Extremity/Trunk Assessment   Upper Extremity Assessment: Overall WFL for tasks assessed           Lower Extremity Assessment: RLE deficits/detail;LLE deficits/detail RLE Deficits / Details: pt able to initiate R active hip flexion with max encouragement LLE Deficits / Details: grossly 4-/5  Cervical / Trunk Assessment: Normal  Communication   Communication: No difficulties  Cognition Arousal/Alertness: Awake/alert Behavior During Therapy: Anxious (re: pain) Overall Cognitive Status: Within Functional Limits for tasks assessed                      General Comments      Exercises General Exercises - Lower Extremity Gluteal Sets: AROM;10 reps;Supine Long Arc Quad: AROM;10 reps;Seated;Left Heel Slides: AROM;10 reps;Supine;Left Hip Flexion/Marching: AROM;Right;10 reps;Supine  Assessment/Plan    PT Assessment Patient needs continued PT services  PT Diagnosis Difficulty walking;Acute pain   PT Problem List Decreased strength;Decreased range of motion;Decreased activity tolerance;Decreased balance;Decreased mobility;Decreased coordination;Pain  PT Treatment Interventions DME instruction;Gait training;Functional mobility training;Therapeutic activities;Therapeutic exercise   PT Goals (Current goals can be found in the Care Plan section) Acute Rehab PT Goals PT Goal Formulation: With patient Time For Goal Achievement: 10/12/13 Potential to Achieve Goals: Good    Frequency Min 3X/week   Barriers to discharge Decreased caregiver support lives alone    Co-evaluation               End of Session    Activity Tolerance: Patient tolerated treatment well Patient left: in bed;with call bell/phone within reach Nurse Communication: Mobility status         Time: 7078-6754 PT Time Calculation (min): 27 min   Charges:   PT Evaluation $Initial PT Evaluation Tier I: 1 Procedure PT Treatments $Therapeutic Exercise: 8-22 mins   PT G CodesKingsley Callander 09/28/2013, 10:43 AM  Kittie Plater, PT, DPT Pager #: [phone] Office #: [phone]

## 2013-09-28 NOTE — Evaluation (Signed)
Occupational Therapy Evaluation Patient Details Name: ZERINA HALLINAN MRN: [id number] DOB: 06-Jun-1921 Today's Date: 09/28/2013    History of Present Illness Pt s/p R AKA 8/11. H/o L LE fem pop 04/2013.   Clinical Impression   Patient is s/p RT AKA surgery resulting in functional limitations due to the deficits listed below (see OT problem list). Pt living at home alone PTA. Patient will benefit from skilled OT acutely to increase independence and safety with ADLS to allow discharge SNF. OT to follow acutely for RT AKA wrapping and adl retraining. Pt educated on touching Rt AKA to decr phantom pain.     Follow Up Recommendations  SNF    Equipment Recommendations  Other (comment) (defer snf)    Recommendations for Other Services       Precautions / Restrictions Precautions Precautions: Fall Precaution Comments: R AKA Restrictions RLE Weight Bearing: Non weight bearing      Mobility Bed Mobility Overal bed mobility: Needs Assistance Bed Mobility: Rolling;Sidelying to Sit;Supine to Sit Rolling: Max assist Sidelying to sit: Max assist Supine to sit: Mod assist;HOB elevated     General bed mobility comments: max v/c for sequence and progression. Pt screaming out but when asked question can stop and answer question. Question anxiety vs true pain causing screaming. Pt reaching with RT UE for side of bed and needed (A) to push up with LT UE into sittin gposition. ONce eob min guard (A) with bil UE support  Transfers Overall transfer level: Needs assistance Equipment used: 2 person hand held assist Transfers: Sit to/from [employer] Transfers Sit to Stand: +2 physical assistance;Max assist Stand pivot transfers: +2 physical assistance;Max assist       General transfer comment: (A) to facilitate hip extension with pad used. Pt once in standing required total (A) to pivot on LT LE. pt needed (A) to swing hips toward chair suface. Pt reaching back with BIL UE      Balance Overall balance assessment: Needs assistance Sitting-balance support: Bilateral upper extremity supported;Feet supported Sitting balance-Leahy Scale: Fair     Standing balance support: Bilateral upper extremity supported;During functional activity Standing balance-Leahy Scale: Poor                              ADL Overall ADL's : Needs assistance/impaired Eating/Feeding: Set up;Sitting   Grooming: Wash/dry face;Oral care;Min guard;Sitting   Upper Body Bathing: Minimal assitance;Sitting   Lower Body Bathing: Total assistance;Sit to/from stand   Upper Body Dressing : Minimal assistance   Lower Body Dressing: Total assistance;Sit to/from stand   Toilet Transfer: +2 for physical assistance;Moderate assistance;Stand-pivot;BSC             General ADL Comments: PT supine on arrival and very anxious with mobility to EOB. pt encouraged to complete task after ordering lunch. pt with less anxiety after rapport built with ordering lunch. pt once eob and discussing love for feeding squirrels agreeable to chair position. Pt once in chair reports zero as pain level     Vision                     Perception     Praxis      Pertinent Vitals/Pain Pain Assessment: 0-10 Pain Score: 6  Pain Location: Rt residual limb Pain Descriptors / Indicators: Constant Pain Intervention(s): Repositioned     Hand Dominance Right   Extremity/Trunk Assessment Upper Extremity Assessment Upper Extremity Assessment: Overall WFL for tasks  assessed   Lower Extremity Assessment Lower Extremity Assessment: Defer to PT evaluation   Cervical / Trunk Assessment Cervical / Trunk Assessment: Kyphotic   Communication Communication Communication: No difficulties   Cognition Arousal/Alertness: Awake/alert Behavior During Therapy: Anxious Overall Cognitive Status: Impaired/Different from baseline Area of Impairment: Memory     Memory: Decreased short-term memory          General Comments: Pt very anxious of all pain and prior to mobility. pt asking several times "now what are we doing again?"  Pt repeating back to therapist and then after delay time period states "what are we doing?"   General Comments       Exercises       Shoulder Instructions      Home Living Family/patient expects to be discharged to:: Skilled nursing facility                                 Additional Comments: family present and requesting SNF for d/c after discussion with CIR coordinator      Prior Functioning/Environment Level of Independence: Independent        Comments: pt reports using walker recently    OT Diagnosis: Generalized weakness;Acute pain   OT Problem List: Decreased strength;Decreased activity tolerance;Impaired balance (sitting and/or standing);Decreased safety awareness;Decreased knowledge of use of DME or AE;Decreased cognition;Decreased knowledge of precautions;Pain   OT Treatment/Interventions: Self-care/ADL training;Therapeutic exercise;DME and/or AE instruction;Therapeutic activities;Patient/family education;Balance training    OT Goals(Current goals can be found in the care plan section) Acute Rehab OT Goals Patient Stated Goal: to make the pain stop OT Goal Formulation: With patient Time For Goal Achievement: 10/12/13 Potential to Achieve Goals: Good  OT Frequency: Min 2X/week   Barriers to D/C:            Co-evaluation              End of Session Equipment Utilized During Treatment: Gait belt Nurse Communication: Mobility status;Precautions  Activity Tolerance: Patient tolerated treatment well Patient left: in chair;with call bell/phone within reach   Time: 1017-1047 OT Time Calculation (min): 30 min Charges:  OT General Charges $OT Visit: 1 Procedure OT Evaluation $Initial OT Evaluation Tier I: 1 Procedure OT Treatments $Self Care/Home Management : 23-37 mins G-Codes:    Peri Maris 19-Oct-2013,  1:45 PM Pager: [phone]

## 2013-09-28 NOTE — Progress Notes (Signed)
09/28/2013 6:40 AM Progress note The patient was offered to try to ambulate or pivot from the bed to the chair on the first post-op morning after a right AKA.  The patient refused, stating that the amputated area was hurting too much. Will continue to monitor patient and to encourage activity as tolerated. Lupita Dawn

## 2013-09-28 NOTE — Progress Notes (Signed)
09/27/2013 09:20 PM Progress note During the admission nurse's conversation with the patient's daughter, it was found that the patient had wanted her code to be DNR. The admission nurse noted that the patient's current code was Full. The daughter explained that the patient had previously been labeled as a DNR and when they had brought the paperwork with them to the ER they were told that the DNR code should already be in the patient's chart. I asked the patient if it was true she wished to be a DNR code, which she confirmed. I then paged Dr. Trula Slade about the patient's code and her wish to be changed to a DNR.  Dr. Trula Slade agreed to have the patient's code changed to DNR when made sure it was the patient's conscious choice. Lupita Dawn

## 2013-09-28 NOTE — Progress Notes (Signed)
09/28/2013 0100 AM Progress note 4C95. The patient had been placed on a bedpan when she stated she needed to urinate.  After ten minutes, the patient was still unable to void. She had complaints of pressure in her lower abdomen. When several attempts were made to help the patient to void, I decided to bladder scan the patient.  The scan showed that the patient was holding about 879 cc of urine in her bladder. We followed the urinary catheter guidelines as stated in her orders. The patient was straight cathed and about 850 cc was released.  Will continue to monitor patient's outcomes. Suzanne [name]

## 2013-09-28 NOTE — Progress Notes (Signed)
   VASCULAR SURGERY ASSESSMENT & PLAN:  * 1 Day Post-Op s/p: Right Above the Knee Amputation  *  Rehab consulted.  * Lovenox for DVT prophylaxis  * IS  SUBJECTIVE: Moderate discomfort  PHYSICAL EXAM: Filed Vitals:   09/27/13 1315 09/27/13 2044 09/27/13 2122 09/28/13 0419  BP: 125/37 135/57  128/49  Pulse: 62 74  81  Temp: 97.9 F (36.6 C) 98 F (36.7 C)  98.2 F (36.8 C)  TempSrc:  Oral  Oral  Resp: 12 18  18   Height:      Weight:      SpO2: 100% 98% 97% 99%   Dressing dry  LABS: Lab Results  Component Value Date   WBC 7.8 09/28/2013   HGB 8.6* 09/28/2013   HCT 26.6* 09/28/2013   MCV 96.7 09/28/2013   PLT 195 09/28/2013   Lab Results  Component Value Date   CREATININE 2.16* 09/26/2013   Lab Results  Component Value Date   INR 1.06 09/26/2013   PROTIME 29.3* 08/01/2011   CBG (last 3)   Recent Labs  09/27/13 1205 09/27/13 1748 09/27/13 2119  GLUCAP 103* 112* 115*   Active Problems:   PAD (peripheral artery disease)  Gae Gallop Beeper: [phone] 09/28/2013

## 2013-09-28 NOTE — Progress Notes (Signed)
Inpatient Rehabilitation  I met with the patient at the bedside to begin discussions of her post acute rehab options.  Pt. wanted me to phone her daughter.  Spoke with daughter Desarai Barrack [phone]) regarding her mom's post acute rehab needs.  Gina plans for her mom to go to SNF for her rehab and strongly declines CIR as an option.  She requests [facility].  I informed Liz Beach SW and Ellan Lambert CM of this request from daughter.  I will sign off.  Please call if questions.  [facility] Admissions Coordinator Cell [phone] Office [phone]

## 2013-09-29 ENCOUNTER — Telehealth: Payer: Self-pay | Admitting: Vascular Surgery

## 2013-09-29 ENCOUNTER — Encounter (HOSPITAL_COMMUNITY): Payer: Self-pay | Admitting: Vascular Surgery

## 2013-09-29 LAB — CBC
HCT: 25.7 % — ABNORMAL LOW (ref 36.0–46.0)
Hemoglobin: 8.1 g/dL — ABNORMAL LOW (ref 12.0–15.0)
MCH: 30.1 pg (ref 26.0–34.0)
MCHC: 31.5 g/dL (ref 30.0–36.0)
MCV: 95.5 fL (ref 78.0–100.0)
PLATELETS: 203 10*3/uL (ref 150–400)
RBC: 2.69 MIL/uL — AB (ref 3.87–5.11)
RDW: 14.1 % (ref 11.5–15.5)
WBC: 8.6 10*3/uL (ref 4.0–10.5)

## 2013-09-29 LAB — GLUCOSE, CAPILLARY
GLUCOSE-CAPILLARY: 130 mg/dL — AB (ref 70–99)
Glucose-Capillary: 128 mg/dL — ABNORMAL HIGH (ref 70–99)
Glucose-Capillary: 187 mg/dL — ABNORMAL HIGH (ref 70–99)
Glucose-Capillary: 123 mg/dL — ABNORMAL HIGH (ref 70–99)

## 2013-09-29 LAB — BASIC METABOLIC PANEL
Anion gap: 13 (ref 5–15)
BUN: 45 mg/dL — ABNORMAL HIGH (ref 6–23)
CHLORIDE: 102 meq/L (ref 96–112)
CO2: 24 mEq/L (ref 19–32)
Calcium: 9.1 mg/dL (ref 8.4–10.5)
Creatinine, Ser: 1.95 mg/dL — ABNORMAL HIGH (ref 0.50–1.10)
GFR calc non Af Amer: 22 mL/min — ABNORMAL LOW (ref >90)
GFR, EST AFRICAN AMERICAN: 25 mL/min — AB (ref >90)
GLUCOSE: 133 mg/dL — AB (ref 70–99)
POTASSIUM: 4.7 meq/L (ref 3.7–5.3)
Sodium: 139 mEq/L (ref 137–147)

## 2013-09-29 NOTE — Telephone Encounter (Addendum)
Message copied by Doristine Section on Thu Sep 29, 2013 11:25 AM ------      Message from: Mena Goes      Created: Tue Sep 27, 2013  1:09 PM      Regarding: schedule                   ----- Message -----         From: Angelia Mould, MD         Sent: 09/27/2013  12:02 PM           To: Vvs Charge Pool      Subject: charge                                                   This patient had a right above-the-knee amputation. I'll need to see her 1 month postop for staple removal. Thank you. CD ------  notified patient of post op visit with dr. Scot Dock on 10-26-13 at 12:45

## 2013-09-29 NOTE — Progress Notes (Signed)
   VASCULAR SURGERY ASSESSMENT & PLAN:  * 2 Days Post-Op s/p: Right AKA. So far looks good  * CIR vs [facility]. Daughter to discuss with CIR  * Stage 2 CKD. Crt today is 1.95 and is stable. Continue to follow.   * Lovenox for DVT prophylaxis.  SUBJECTIVE: Pain well controlled.  PHYSICAL EXAM: Filed Vitals:   09/28/13 2219 09/29/13 0300 09/29/13 0732 09/29/13 0757  BP:  101/61  101/42  Pulse: 77 82  73  Temp:  98.5 F (36.9 C)    TempSrc:  Oral    Resp: 16 17    Height:      Weight:      SpO2: 94% 96% 94%    Dressing changed. Right AKA looks good so far. Some ecchymosis on posterior aspect of AKA  LABS: Lab Results  Component Value Date   WBC 8.6 09/29/2013   HGB 8.1* 09/29/2013   HCT 25.7* 09/29/2013   MCV 95.5 09/29/2013   PLT 203 09/29/2013   Lab Results  Component Value Date   CREATININE 1.95* 09/29/2013   Lab Results  Component Value Date   INR 1.06 09/26/2013   PROTIME 29.3* 08/01/2011   CBG (last 3)   Recent Labs  09/28/13 1628 09/28/13 2111 09/29/13 0600  GLUCAP 154* 152* 128*   Active Problems:   PAD (peripheral artery disease)  Gae Gallop Beeper: [phone] 09/29/2013

## 2013-09-29 NOTE — Progress Notes (Addendum)
Clinical Social Work Department CLINICAL SOCIAL WORK PLACEMENT NOTE 09/29/2013  Patient:  Suzanne [name], Suzanne [name]  Account Number:  [id number] Admit date:  09/27/2013  Clinical Social Worker:  Berton Mount, Latanya Presser  Date/time:  09/29/2013 04:00 PM  Clinical Social Work is seeking post-discharge placement for this patient at the following level of care:   [place]   (*CSW will update this form in Epic as items are completed)   09/29/2013  Patient/family provided with [place] Department of Clinical Social Work's list of facilities offering this level of care within the geographic area requested by the patient (or if unable, by the patient's family).  09/29/2013  Patient/family informed of their freedom to choose among providers that offer the needed level of care, that participate in Medicare, Medicaid or managed care program needed by the patient, have an available bed and are willing to accept the patient.  09/29/2013  Patient/family informed of [facility]' ownership interest in [facility], as well as of the fact that they are under no obligation to receive care at this facility.  PASARR submitted to EDS on 09/29/2013 PASARR number received on 09/29/2013  FL2 transmitted to all facilities in geographic area requested by pt/family on  09/29/2013 FL2 transmitted to all facilities within larger geographic area on   Patient informed that his/her managed care company has contracts with or will negotiate with  certain facilities, including the following:     Patient/family informed of bed offers received:  09/30/2013 Patient chooses bed at [facility] Physician recommends and patient chooses bed at    Patient to be transferred [place]  on  10/03/2013 Patient to be transferred to facility by [facility] Patient and family notified of transfer on 10/03/2013 Name of family member notified:  Tomie China (Daughter)  The following physician request were entered in  Epic: Physician Request  Please sign FL2.    Additional CommentsBerton Mount, [place]

## 2013-09-29 NOTE — Progress Notes (Signed)
Physical Therapy Treatment Patient Details Name: Suzanne [name] MRN: [id number] DOB: [birth year] Today's Date: 09/29/2013    History of Present Illness Pt s/p R AKA 8/11. H/o L LE fem pop 04/2013.    PT Comments    Pt admitted with right AKA. Pt currently with functional limitations due to balance and endurance deficits as well as pain in back.  Hopeful that pt can progress and get home after a REhab stay.  Pt will benefit from skilled PT to increase their independence and safety with mobility to allow discharge to the venue listed below.   Follow Up Recommendations  CIR     Equipment Recommendations  Other (comment) (TBA)    Recommendations for Other Services Rehab consult     Precautions / Restrictions Precautions Precautions: Fall Precaution Comments: R AKA Restrictions Weight Bearing Restrictions: No RLE Weight Bearing:  (AKA)    Mobility  Bed Mobility Overal bed mobility: Needs Assistance;+2 for physical assistance Bed Mobility: Supine to Sit Rolling: Mod assist   Supine to sit: Mod assist;+2 for physical assistance;HOB elevated     General bed mobility comments: pt using bil UE to help pivot UB around on bed surface. Pt requried (A) for LT LE and sliding into midline  Transfers Overall transfer level: Needs assistance Equipment used: 2 person hand held assist Transfers: Sit to/from [employer] Sit to Stand: +2 physical assistance;Max assist   Squat pivot transfers: Mod assist;+2 physical assistance     General transfer comment: (A) to facilitate weight shift and hip rotation.  Got pt onto 3N1 and gave her call bell and asked her to call nursing when done.    Ambulation/Gait                 Stairs            Wheelchair Mobility    Modified Rankin (Stroke Patients Only)       Balance Overall balance assessment: Needs assistance;History of Falls Sitting-balance support: Bilateral upper extremity supported;Feet  supported Sitting balance-Leahy Scale: Fair Sitting balance - Comments: Sat EOB 5 min and did a few exercises prior to getting onto 3N1.     Standing balance support: Bilateral upper extremity supported;During functional activity Standing balance-Leahy Scale: Poor Standing balance comment: Needs UE support and cannot acheive full upright stance.                      Cognition Arousal/Alertness: Awake/alert Behavior During Therapy: Anxious Overall Cognitive Status: Impaired/Different from baseline Area of Impairment: Memory     Memory: Decreased short-term memory         General Comments: Pt more alert today.  Still with pain but seems to be under better control.      Exercises General Exercises - Lower Extremity Long Arc Quad: AROM;10 reps;Seated;Left Hip Flexion/Marching: AROM;Right;10 reps;Supine    General Comments        Pertinent Vitals/Pain Pain Assessment: Faces Pain Score: 6  Faces Pain Scale: Hurts even more Pain Location: right AKA Pain Descriptors / Indicators: Constant Pain Intervention(s): Premedicated before session;Repositioned VSS.     Home Living                      Prior Function            PT Goals (current goals can now be found in the care plan section) Progress towards PT goals: Progressing toward goals    Frequency  Min 3X/week  PT Plan Current plan remains appropriate    Co-evaluation             End of Session Equipment Utilized During Treatment: Gait belt;Oxygen Activity Tolerance: Patient tolerated treatment well Patient left: in chair;with call bell/phone within reach     Time: 1121-1135 PT Time Calculation (min): 14 min  Charges:  $Self Care/Home Management: 8-22                    G Codes:      Suzanne [name] Suzanne [name] 10-07-2013, 1:19 PM [facility] [phone] (pager)

## 2013-09-29 NOTE — Progress Notes (Signed)
09/29/13 1320  PT Visit Information  Last PT Received On 09/29/13  Assistance Needed +2  History of Present Illness Pt s/p R AKA 8/11. H/o L LE fem pop 04/2013.  PT Time Calculation  PT Start Time 1152  PT Stop Time 1205  PT Time Calculation (min) 13 min  Subjective Data  Subjective "I can't go."  Precautions  Precautions Fall  Precaution Comments R AKA  Restrictions  Weight Bearing Restrictions No  RLE Weight Bearing (AKA)  Pain Assessment  Pain Assessment Faces  Pain Score 6  Faces Pain Scale 6  Pain Location right AKA and left back and hip  Pain Descriptors / Indicators Constant  Pain Intervention(s) Monitored during session;Repositioned  Cognition  Arousal/Alertness Awake/alert  Behavior During Therapy Anxious  Overall Cognitive Status Impaired/Different from baseline  Area of Impairment Memory  Memory Decreased short-term memory  General Comments Pt more alert today.  Still with pain but seems to be under better control.    Transfers  Overall transfer level Needs assistance  Equipment used 2 person hand held assist  Transfers Sit to/from [employer] Transfers  Sit to Stand +2 physical assistance;Max assist  Squat pivot transfers Mod assist;+2 physical assistance  General transfer comment (A) to facilitate weight shift and hip rotation.  Got pt from 3N1 to recliner with pt still needing mod assist for transfer limited by left back and hip pain per pt.  Balance  Standing balance support Bilateral upper extremity supported;During functional activity  Standing balance-Leahy Scale Poor  Standing balance comment Continues to need UE support and cannot achieve full upright stance.    General Comments  General comments (skin integrity, edema, etc.) Pt lunch delivered and pt did not like anything on tray.  This PT called and assisted pt in ordering a new tray.  Followed up with dietary to ensure that they ask pt her menu in future.  Also notified nursing that pt did not  void and nursing was going to do a bladder scan.    PT - End of Session  Equipment Utilized During Treatment Gait belt;Oxygen  Activity Tolerance Patient tolerated treatment well  Patient left in chair;with call bell/phone within reach  Nurse Communication Mobility status  PT - Assessment/Plan  PT Plan Current plan remains appropriate  PT Frequency Min 3X/week  Recommendations for Other Services Rehab consult  Follow Up Recommendations CIR  PT equipment Other (comment) (TBA)  PT Goal Progression  Progress towards PT goals Progressing toward goals  PT General Charges  $$ ACUTE PT VISIT 1 Procedure  PT Treatments  $Therapeutic Activity 8-22 mins  [facility] [phone] (pager)

## 2013-09-29 NOTE — Progress Notes (Signed)
Clinical Social Work Department BRIEF PSYCHOSOCIAL ASSESSMENT 09/29/2013  Patient:  Suzanne [name], Suzanne [name]     Account Number:  [id number]     Admit date:  09/27/2013  Clinical Social Worker:  Adair Laundry  Date/Time:  09/29/2013 03:30 PM  Referred by:  Physician  Date Referred:  09/29/2013 Referred for  SNF Placement   Other Referral:   Interview type:  Patient Other interview type:   Spoke with pt at bedside and later with pt daughter on phone    PSYCHOSOCIAL DATA Living Status:  ALONE Admitted from facility:   Level of care:   Primary support name:  Tavia Stave Primary support relationship to patient:  CHILD, ADULT Degree of support available:   Pt has good family support    CURRENT CONCERNS Current Concerns  Post-Acute Placement   Other Concerns:    SOCIAL WORK ASSESSMENT / PLAN CSW made aware that pt family declining CIR and prefer SNF. CSW visited pt room and spoke with pt. Pt informed CSW that CSW should speak with her daughter but from her understanding her daughter wanted her to dc to [facility]. CSW called pt daughter [name] who confirmed this was what they wanted. CSW explained SNF referral process and pt daughter was agreeable to referral being sent to all [facility] facilities. CSW did explain that CSW would call [facility] to notify of preference. Pt daughter was extremely thankful for this. Pt daughter also very worried about other aspects of SNF. CSW explained as best possible but also informed that once facility was chosen they would help clarify as well. Pt daughter requesting non-emergent ambulance since she would be nervous to transport pt by car.   Assessment/plan status:  Psychosocial Support/Ongoing Assessment of Needs Other assessment/ plan:   Information/referral to community resources:   SNF list to be provided with bed offers    PATIENT'S/FAMILY'S RESPONSE TO PLAN OF CARE: Pt and pt daughter agreeable to SNF       [place],  [place]

## 2013-09-29 NOTE — Discharge Summary (Signed)
[facility] Discharge Summary   Patient ID:  Suzanne [name] MRN: [id number] DOB/AGE: 1922/01/31 80 y.o.  Admit date: 09/27/2013 Discharge date: 09/29/2013 Date of Surgery: 09/27/2013 Surgeon: Surgeon(s): Angelia Mould, MD  Admission Diagnosis: Ischemia right foot  Discharge Diagnoses:  Ischemia right foot  Secondary Diagnoses: Past Medical History  Diagnosis Date  . PVD (peripheral vascular disease)     a. PTA & stent right superficial artery PTA & left common iliac artery stenting. Right fem-pop bypass graft. b. Per Dr. Kennon Holter note: h/o aortobifemoral bypass grafting remotely as well as fem-peroneal bypass grafting.  . Mixed hyperlipidemia   . Compression fx, thoracic spine     T12  . Ischium fracture   . Memory loss, short term   . Pulmonary nodule   . Chronic diastolic CHF (congestive heart failure) 09-2008    a. EF 40% by cath 2010. b. Improved - last echo 2015 with normal EF.  [name] Anemia   . Detached retina 02-2005    R EYE  . Fracture 02-2007    L5 AND PELVIS  . Left ventricular dysfunction     a. EF 40% by cath 2010. b. Improved - last echo 2015 with normal EF.   [name] AAA (abdominal aortic aneurysm)     Repair unknown date  . Critical lower limb ischemia   . Chronic respiratory failure     a. On home O2.  [name] Hypertension   . Warfarin anticoagulation     DC summary from 2010 reports she is "on chronic  Coumadin for vascular disease." This is listed on her med list going back to 2006 by records available. Teaching service H&P remotely says she has h/o DVT but I cannot find any evidence of this. Admission H/P 03/2013 suggests history of atrial fib but all ECGs appear sinus and there is no mention of this previously.  [name] COPD (chronic obstructive pulmonary disease)   . Type II or unspecified type diabetes mellitus without mention of complication, not stated as uncontrolled   . Complication of anesthesia     hard to wake up, bp problems   .  Pneumonia     hx  . GERD (gastroesophageal reflux disease)   . Arthritis   . CAD (coronary artery disease) 10/12/2008    a. 30% LAD,30% CX & 40% tandem mid stenosis in the AV groove, 50-60% RCA  . Renal artery stenosis     a. 1-59% by doppler in 2014.; CKD stage III (Dr. Erling Cruz)  . On home oxygen therapy     "2.5L O2 at sleep or w/increased activity" (09/27/2013)    Procedure(s): AMPUTATION ABOVE KNEE-RIGHT  Discharged Condition: good  HPI: Suzanne [name] is a 80 y.o. female who had undergone a right femoral to anterior tibial artery bypass graft in 2009 by Dr. Drucie Opitz. She has ulcerative undergone previous aortobifemoral bypass grafting. In May he was noted that her right femoral tibial bypass graft is occluded it was not clear when that occurred.    Hospital Course:  Suzanne [name] is a 80 y.o. female is S/P Right Procedure(s): AMPUTATION ABOVE KNEE-RIGHT She was admitted post surgery to 2W.  She was unable to void and was bladder scanned then in/out catheter times 1 and now voiding independently.  She had pain issues and was kept over the weekend to wean off IV pain medication.  Hydrocodone give for discharge.  She will be discharged today and F/U in 3 weeks for staple removal.  Disposition stable. Significant Diagnostic Studies: CBC Lab Results  Component Value Date   WBC 8.6 09/29/2013   HGB 8.1* 09/29/2013   HCT 25.7* 09/29/2013   MCV 95.5 09/29/2013   PLT 203 09/29/2013    BMET    Component Value Date/Time   NA 139 09/29/2013 0330   NA 145 08/01/2011   K 4.7 09/29/2013 0330   CL 102 09/29/2013 0330   CO2 24 09/29/2013 0330   GLUCOSE 133* 09/29/2013 0330   BUN 45* 09/29/2013 0330   BUN 37* 08/01/2011   CREATININE 1.95* 09/29/2013 0330   CREATININE 2.67* 08/04/2013 1120   CREATININE 1.5* 08/01/2011   CALCIUM 9.1 09/29/2013 0330   GFRNONAA 22* 09/29/2013 0330   GFRAA 25* 09/29/2013 0330   COAG Lab Results  Component Value Date   INR 1.06 09/26/2013   INR 1.05 09/08/2013    INR 1.05 04/18/2013   PROTIME 29.3* 08/01/2011     Disposition:  Discharge to :Skilled nursing facility    Medication List    ASK your doctor about these medications       acetaminophen 500 MG tablet  Commonly known as:  TYLENOL  Take 1,000 mg by mouth every 4 (four) hours as needed for moderate pain.     albuterol (2.5 MG/3ML) 0.083% nebulizer solution  Commonly known as:  PROVENTIL  Take 2.5 mg by nebulization 3 (three) times daily as needed for wheezing or shortness of breath.     aspirin 325 MG EC tablet  Take 1 tablet (325 mg total) by mouth daily.     budesonide-formoterol 160-4.5 MCG/ACT inhaler  Commonly known as:  SYMBICORT  Inhale 2 puffs into the lungs 2 (two) times daily.     CALCIUM 600 + D PO  Take 1 tablet by mouth daily.     COMBIVENT RESPIMAT 20-100 MCG/ACT Aers respimat  Generic drug:  Ipratropium-Albuterol  Inhale 2 puffs into the lungs every 6 (six) hours as needed for wheezing or shortness of breath.     esomeprazole 40 MG capsule  Commonly known as:  NEXIUM  Take 40 mg by mouth daily before breakfast.     Fish Oil 1200 MG Caps  Take 1,200 mg by mouth 2 (two) times daily.     furosemide 40 MG tablet  Commonly known as:  LASIX  Take 40 mg by mouth daily.     HYDROcodone-acetaminophen 7.5-325 MG per tablet  Commonly known as:  NORCO  Take 1 tablet by mouth every 4 (four) hours as needed. Take one half tablet as needed for pain every 6 hours     ibandronate 150 MG tablet  Commonly known as:  BONIVA  Take 150 mg by mouth every 30 (thirty) days. Take in the morning with a full glass of water, on an empty stomach, and do not take anything else by mouth or lie down for the next 30 min. (take on the 25th of each month)     insulin glargine 100 UNIT/ML injection  Commonly known as:  LANTUS  Inject 12 Units into the skin at bedtime.     lidocaine 5 %  Commonly known as:  LIDODERM  Place 1 patch onto the skin daily. Remove & Discard patch within  12 hours or as directed by MD     losartan 100 MG tablet  Commonly known as:  COZAAR  Take 100 mg by mouth daily.     metoprolol succinate 25 MG 24 hr tablet  Commonly known as:  TOPROL-XL  Take 25 mg by mouth daily.     ranitidine 150 MG tablet  Commonly known as:  ZANTAC  Take 150 mg by mouth 2 (two) times daily.     rosuvastatin 20 MG tablet  Commonly known as:  CRESTOR  Take 20 mg by mouth daily.       Verbal and written Discharge instructions given to the patient. Wound care per Discharge AVS     Follow-up Information   Follow up with DICKSON,CHRISTOPHER S, MD In 4 weeks.   Specialty:  Vascular Surgery   Contact information:   [place] [address] [phone]       Signed: Laurence Slate [facility] 09/29/2013, 11:14 AM

## 2013-09-30 LAB — GLUCOSE, CAPILLARY
GLUCOSE-CAPILLARY: 119 mg/dL — AB (ref 70–99)
Glucose-Capillary: 114 mg/dL — ABNORMAL HIGH (ref 70–99)
Glucose-Capillary: 185 mg/dL — ABNORMAL HIGH (ref 70–99)
Glucose-Capillary: 162 mg/dL — ABNORMAL HIGH (ref 70–99)

## 2013-09-30 NOTE — Progress Notes (Signed)
CARE MANAGEMENT NOTE 09/30/2013  Patient:  Suzanne [name], Suzanne [name]   Account Number:  [id number]  Date Initiated:  09/28/2013  Documentation initiated by:  Lorne Skeens  Subjective/Objective Assessment:   Patient was admitted for a right above knee amputation. Lives at home alone.     Action/Plan:   Will follow for discharge needs.   Anticipated DC Date:  09/30/2013   Anticipated DC Plan:  SKILLED NURSING FACILITY  In-house referral  Clinical Social Worker      DC Planning Services  CM consult      Choice offered to / List presented to:             Status of service:  Completed, signed off Medicare Important Message given?  YES (If response is "NO", the following Medicare IM given date fields will be blank) Date Medicare IM given:  09/30/2013 Medicare IM given by:  [facility] Date Additional Medicare IM given:   Additional Medicare IM given by:    Discharge Disposition:  [facility]  Per UR Regulation:  Reviewed for med. necessity/level of care/duration of stay  If discussed at [place] of Stay Meetings, dates discussed:    Comments:  09/28/13 Ellan Lambert, RN, BSN [phone] Pt/family requests dc to [facility] SNF at dc; CSW consulted to facilitate dc to SNF when medically stable for dc.

## 2013-09-30 NOTE — Progress Notes (Signed)
VASCULAR SURGERY  ADDENDUM: The patient is still requiring intravenous pain medication. She is not ready for transfer to the skilled nursing facility today. She is also requiring in and out cath. Possible discharge tomorrow or Monday. [name], MD, [place] [phone] 09/30/2013

## 2013-09-30 NOTE — Progress Notes (Signed)
   VASCULAR SURGERY ASSESSMENT & PLAN:  * 3 Days Post-Op s/p: Right AKA  *  Still requiring intravenous pain medication.  * She could potentially go to the skilled nursing facility once her pain is adequately controlled on po pain  meds. I have signed her FL2.  SUBJECTIVE: pain adequately controlled.  PHYSICAL EXAM: Filed Vitals:   09/29/13 2017 09/29/13 2120 09/30/13 0430 09/30/13 0859  BP:  100/52 93/40   Pulse:  78 77   Temp:  98.7 F (37.1 C) 98.2 F (36.8 C)   TempSrc:  Oral Oral   Resp:  16 16   Height:      Weight:      SpO2: 99% 91% 94% 93%   Right AKA inspected and looks good so far. There is some ecchymosis posteriorly.  LABS: Lab Results  Component Value Date   WBC 8.6 09/29/2013   HGB 8.1* 09/29/2013   HCT 25.7* 09/29/2013   MCV 95.5 09/29/2013   PLT 203 09/29/2013   Lab Results  Component Value Date   CREATININE 1.95* 09/29/2013   Lab Results  Component Value Date   INR 1.06 09/26/2013   PROTIME 29.3* 08/01/2011   CBG (last 3)   Recent Labs  09/29/13 1639 09/29/13 2116 09/30/13 0641  GLUCAP 187* 123* 114*   Active Problems:   PAD (peripheral artery disease)  Gae Gallop Beeper: [phone] 09/30/2013

## 2013-09-30 NOTE — Progress Notes (Signed)
Pt had an in and out cath X's 2-once last night and once this am at 0630; notified PA; PA gave verbal order if pt is unable to void to place foley; pt has been bladder scanned X's 3 today and on the 3rd bladder scan urine exceeded 450; foley was placed per protocol.  Rowe Pavy, RN

## 2013-09-30 NOTE — Progress Notes (Signed)
CSW Armed forces technical officer) spoke with pt and pt will be discharging to [facility]. Pt daughter completed paperwork with facility yesterday. Pt is able to dc to facility today or over weekend if medically stable.  Suzanne [name], Suzanne [name]

## 2013-10-01 LAB — GLUCOSE, CAPILLARY
GLUCOSE-CAPILLARY: 155 mg/dL — AB (ref 70–99)
Glucose-Capillary: 114 mg/dL — ABNORMAL HIGH (ref 70–99)
Glucose-Capillary: 137 mg/dL — ABNORMAL HIGH (ref 70–99)

## 2013-10-01 LAB — CBC
HCT: 22.4 % — ABNORMAL LOW (ref 36.0–46.0)
HEMOGLOBIN: 7.1 g/dL — AB (ref 12.0–15.0)
MCH: 30.2 pg (ref 26.0–34.0)
MCHC: 31.7 g/dL (ref 30.0–36.0)
MCV: 95.3 fL (ref 78.0–100.0)
Platelets: 236 10*3/uL (ref 150–400)
RBC: 2.35 MIL/uL — ABNORMAL LOW (ref 3.87–5.11)
RDW: 13.7 % (ref 11.5–15.5)
WBC: 8.5 10*3/uL (ref 4.0–10.5)

## 2013-10-01 LAB — BASIC METABOLIC PANEL
Anion gap: 13 (ref 5–15)
BUN: 59 mg/dL — ABNORMAL HIGH (ref 6–23)
CO2: 23 meq/L (ref 19–32)
Calcium: 8.6 mg/dL (ref 8.4–10.5)
Chloride: 100 mEq/L (ref 96–112)
Creatinine, Ser: 2.37 mg/dL — ABNORMAL HIGH (ref 0.50–1.10)
GFR calc Af Amer: 20 mL/min — ABNORMAL LOW (ref >90)
GFR calc non Af Amer: 17 mL/min — ABNORMAL LOW (ref >90)
GLUCOSE: 169 mg/dL — AB (ref 70–99)
POTASSIUM: 4.3 meq/L (ref 3.7–5.3)
Sodium: 136 mEq/L — ABNORMAL LOW (ref 137–147)

## 2013-10-01 MED ORDER — LACTULOSE 10 GM/15ML PO SOLN
20.0000 g | Freq: Every day | ORAL | Status: DC | PRN
  Administered 2013-10-01: 20 g via ORAL
  Filled 2013-10-01: qty 30

## 2013-10-01 MED ORDER — METOCLOPRAMIDE HCL 5 MG/ML IJ SOLN
10.0000 mg | Freq: Three times a day (TID) | INTRAMUSCULAR | Status: DC
  Administered 2013-10-01 – 2013-10-03 (×5): 10 mg via INTRAVENOUS
  Filled 2013-10-01 (×9): qty 2

## 2013-10-01 MED ORDER — HYDROCODONE-ACETAMINOPHEN 7.5-325 MG PO TABS
1.0000 | ORAL_TABLET | Freq: Every day | ORAL | Status: DC
  Administered 2013-10-01 – 2013-10-03 (×13): 1 via ORAL
  Filled 2013-10-01 (×13): qty 1

## 2013-10-01 NOTE — Progress Notes (Addendum)
     Subjective  - Pain is still an issue.  She waits until it is a "10" before she asks for pain medication.     Objective 98/40 70 98.1 F (36.7 C) (Oral) 16 99%  Intake/Output Summary (Last 24 hours) at 10/01/13 0830 Last data filed at 10/01/13 3382  Gross per 24 hour  Intake    200 ml  Output      0 ml  Net    200 ml    Right stump dressing C/D Non tender to palpation Will change dressing tomorrow  Assessment/Planning: POD #4 s/p: Right AKA Pain issue.  She is still needing IV pain medication.  I will schedule the PO pain medication for q 4 and asked her to wean off the IV medication.  She agreed to try.    Laurence Slate [facility] 10/01/2013 8:30 AM --  Laboratory Lab Results:  Recent Labs  09/29/13 0330  WBC 8.6  HGB 8.1*  HCT 25.7*  PLT 203   BMET  Recent Labs  09/29/13 0330  NA 139  K 4.7  CL 102  CO2 24  GLUCOSE 133*  BUN 45*  CREATININE 1.95*  CALCIUM 9.1    COAG Lab Results  Component Value Date   INR 1.06 09/26/2013   INR 1.05 09/08/2013   INR 1.05 04/18/2013   PROTIME 29.3* 08/01/2011   No results found for this basename: PTT    Addendum  I have independently interviewed and examined the patient, and I agree with the physician assistant's findings.  R AKA bandaged.  Pt still requiring IV pain rx.  Once on PO regimen, trsr to SNF  Adele Barthel, MD [facility] [place] Office: [phone] Pager: [phone]  10/01/2013, 11:39 AM

## 2013-10-01 NOTE — Progress Notes (Signed)
Pt has not had a bowel movement in 5 days; pt has + bowel sounds and is passing gas; called PA; PA gave orders for lactulose, soap suds enema and reglan for nausea; will continue to monitor.  Rowe Pavy, RN

## 2013-10-01 NOTE — Progress Notes (Signed)
Stump dressing was re-dressed; site is clean, dry and intact; Pt is up in chair; 2 assist from bed to chair; pt tolerated well; call bell within reach; will continue to monitor.  Rowe Pavy, RN

## 2013-10-02 LAB — CLOSTRIDIUM DIFFICILE BY PCR: Toxigenic C. Difficile by PCR: NEGATIVE

## 2013-10-02 LAB — GLUCOSE, CAPILLARY
GLUCOSE-CAPILLARY: 105 mg/dL — AB (ref 70–99)
GLUCOSE-CAPILLARY: 142 mg/dL — AB (ref 70–99)
Glucose-Capillary: 133 mg/dL — ABNORMAL HIGH (ref 70–99)
Glucose-Capillary: 134 mg/dL — ABNORMAL HIGH (ref 70–99)

## 2013-10-02 NOTE — Progress Notes (Addendum)
    Subjective  - "my stomach feels ok, watery BM yesterday.  Taking PO's well.   Objective 97/42 76 97.8 F (36.6 C) (Oral) 18 99%  Intake/Output Summary (Last 24 hours) at 10/02/13 0754 Last data filed at 10/02/13 4401  Gross per 24 hour  Intake    200 ml  Output    600 ml  Net   -400 ml    Right AKA incision C/D/I open to air Stump soft with posterior ecchymosis Pos hypo- BS to auscultation, abdomin soft, non tender to palpation  Assessment/Planning: POD #5 s/p: Right AKA Po pain medication is helping with the pain, one dose of IV pain given yesterday  Possible D/C tomorrow Will observe BM today 5 loose watery stool yesterday after Miralax and Lactulose.   [facility] 10/02/2013 7:54 AM --  Laboratory Lab Results:  Recent Labs  10/01/13 1520  WBC 8.5  HGB 7.1*  HCT 22.4*  PLT 236   BMET  Recent Labs  10/01/13 1520  NA 136*  K 4.3  CL 100  CO2 23  GLUCOSE 169*  BUN 59*  CREATININE 2.37*  CALCIUM 8.6    COAG Lab Results  Component Value Date   INR 1.06 09/26/2013   INR 1.05 09/08/2013   INR 1.05 04/18/2013   PROTIME 29.3* 08/01/2011   No results found for this basename: PTT   Addendum  I have independently interviewed and examined the patient, and I agree with the physician assistant's findings.  Nearly off IV pain rx.  Will d/c today.  Pt's diarrhea was after promotility agent was given.  Will check C. Diff just in case.  Likely to SNF tomorrow.  Adele Barthel, MD [facility] Office: [phone] Pager: [phone]  10/02/2013, 8:44 AM

## 2013-10-02 NOTE — Progress Notes (Signed)
Pt was up to chair X's 2 today; pt tolerated sitting up well.  Rowe Pavy, RN

## 2013-10-02 NOTE — Progress Notes (Signed)
Per PA orders stump can be left undressed and open to air.  Rowe Pavy, RN

## 2013-10-03 LAB — BASIC METABOLIC PANEL
Anion gap: 14 (ref 5–15)
BUN: 59 mg/dL — AB (ref 6–23)
CHLORIDE: 99 meq/L (ref 96–112)
CO2: 23 meq/L (ref 19–32)
Calcium: 9 mg/dL (ref 8.4–10.5)
Creatinine, Ser: 2.21 mg/dL — ABNORMAL HIGH (ref 0.50–1.10)
GFR calc Af Amer: 22 mL/min — ABNORMAL LOW (ref >90)
GFR calc non Af Amer: 19 mL/min — ABNORMAL LOW (ref >90)
Glucose, Bld: 119 mg/dL — ABNORMAL HIGH (ref 70–99)
POTASSIUM: 4.2 meq/L (ref 3.7–5.3)
Sodium: 136 mEq/L — ABNORMAL LOW (ref 137–147)

## 2013-10-03 LAB — GLUCOSE, CAPILLARY
GLUCOSE-CAPILLARY: 137 mg/dL — AB (ref 70–99)
Glucose-Capillary: 112 mg/dL — ABNORMAL HIGH (ref 70–99)

## 2013-10-03 LAB — CBC
HCT: 22.5 % — ABNORMAL LOW (ref 36.0–46.0)
HEMOGLOBIN: 7.3 g/dL — AB (ref 12.0–15.0)
MCH: 30.7 pg (ref 26.0–34.0)
MCHC: 32.4 g/dL (ref 30.0–36.0)
MCV: 94.5 fL (ref 78.0–100.0)
Platelets: 262 10*3/uL (ref 150–400)
RBC: 2.38 MIL/uL — AB (ref 3.87–5.11)
RDW: 13.6 % (ref 11.5–15.5)
WBC: 12.8 10*3/uL — ABNORMAL HIGH (ref 4.0–10.5)

## 2013-10-03 MED ORDER — HYDROCODONE-ACETAMINOPHEN 7.5-325 MG PO TABS: 1.0000 | ORAL_TABLET | ORAL | Status: DC | PRN

## 2013-10-03 NOTE — Progress Notes (Addendum)
     Subjective  - Didn't sleep well.  Pain is better controlled.   Objective 118/40 76 98.3 F (36.8 C) (Oral) 17 93%  Intake/Output Summary (Last 24 hours) at 10/03/13 0713 Last data filed at 10/03/13 0411  Gross per 24 hour  Intake    590 ml  Output   1201 ml  Net   -611 ml    Right AKA site is healing well, no drainage actively. ecchymosis posterior stump, soft to touch without hematoma Left foot warm to touch, sensation intact.   Assessment/Planning: POD #5 s/p: Right AKA Pain well controlled on PO medications.  C-diff negative Plan D/C SNF today F/U in 3 weeks with Dr. Pryor Montes, EMMA [facility] 10/03/2013 7:13 AM -- Agree with plans for discharge today.  [name], MD, FACS Beeper [phone] 10/03/2013    Laboratory Lab Results:  Recent Labs  10/01/13 1520 10/03/13 0400  WBC 8.5 12.8*  HGB 7.1* 7.3*  HCT 22.4* 22.5*  PLT 236 262   BMET  Recent Labs  10/01/13 1520 10/03/13 0400  NA 136* 136*  K 4.3 4.2  CL 100 99  CO2 23 23  GLUCOSE 169* 119*  BUN 59* 59*  CREATININE 2.37* 2.21*  CALCIUM 8.6 9.0    COAG Lab Results  Component Value Date   INR 1.06 09/26/2013   INR 1.05 09/08/2013   INR 1.05 04/18/2013   PROTIME 29.3* 08/01/2011   No results found for this basename: PTT

## 2013-10-03 NOTE — Progress Notes (Signed)
Pt discharged and transported via [facility] to [facility]. Discussed with the patient and all questioned fully answered. Payton Emerald, RN

## 2013-10-03 NOTE — Progress Notes (Signed)
Physical Therapy Treatment Patient Details Name: Suzanne [name] MRN: [id number] DOB: [birth year] Today's Date: 10/03/2013    History of Present Illness Pt s/p R AKA 8/11. H/o L LE fem pop 04/2013.    PT Comments    Pt admitted with RAKA. Pt currently with functional limitations due to balance and endurance deficits that are improving with therapy.  Pt will benefit from skilled PT to increase their independence and safety with mobility to allow discharge to the venue listed below.   Follow Up Recommendations  CIR     Equipment Recommendations  Other (comment) (TBA)    Recommendations for Other Services Rehab consult     Precautions / Restrictions Precautions Precautions: Fall Precaution Comments: R AKA Restrictions Weight Bearing Restrictions: Yes RLE Weight Bearing: Non weight bearing    Mobility  Bed Mobility Overal bed mobility: Needs Assistance;+2 for physical assistance Bed Mobility: Supine to Sit Rolling: Mod assist Sidelying to sit: Mod assist;+2 for physical assistance Supine to sit: Mod assist;+2 for physical assistance;HOB elevated     General bed mobility comments: pt using bil UE to help pivot UB around on bed surface. Pt requried (A) for LT LE and sliding into midline  Transfers Overall transfer level: Needs assistance Equipment used: Rolling walker (2 wheeled) Transfers: Sit to/from [facility] Sit to Stand: +2 physical assistance;Max assist Stand pivot transfers: Mod assist;+2 physical assistance;Max assist       General transfer comment: (A) to facilitate weight shift and hip rotation.  Got pt up from recliner with pt standing for 1 minute with RW.  Then transferred still needing mod assist for transfer to bed.  limited by left back and hip pain per pt.  Pt then transferred back to chair after vestibular assessment complete.    Ambulation/Gait                 Stairs            Wheelchair Mobility    Modified Rankin  (Stroke Patients Only)       Balance Overall balance assessment: Needs assistance;History of Falls Sitting-balance support: Bilateral upper extremity supported;Feet supported Sitting balance-Leahy Scale: Fair Sitting balance - Comments: Sat EOB 5 min and did a few exercises.   Standing balance support: Bilateral upper extremity supported;During functional activity Standing balance-Leahy Scale: Poor Standing balance comment: Needs UE support and cannot achieve full upright stance.                      Cognition Arousal/Alertness: Awake/alert Behavior During Therapy: Anxious Overall Cognitive Status: Impaired/Different from baseline Area of Impairment: Memory     Memory: Decreased short-term memory              Exercises General Exercises - Lower Extremity Long Arc Quad: AROM;10 reps;Seated;Left Heel Slides: AROM;10 reps;Supine;Left Hip Flexion/Marching: AROM;Right;10 reps;Supine    General Comments General comments (skin integrity, edema, etc.): Vestibular assessment:  Pt with positive BPPV on left per Modified [place] therefore treated for left BPPV.  Pt appeared to have good results after with the dizziness gone after treatment.        Pertinent Vitals/Pain Pain Assessment: Faces Pain Score: 4  Faces Pain Scale: Hurts little more Pain Location: right AKA Pain Descriptors / Indicators: Dull;Discomfort Pain Intervention(s): Monitored during session;Repositioned VSS    Home Living                      Prior Function  PT Goals (current goals can now be found in the care plan section) Progress towards PT goals: Progressing toward goals    Frequency  Min 3X/week    PT Plan Current plan remains appropriate    Co-evaluation             End of Session Equipment Utilized During Treatment: Gait belt;Oxygen Activity Tolerance: Patient tolerated treatment well Patient left: in chair;with call bell/phone within reach     Time:  0840-0911 PT Time Calculation (min): 31 min  Charges:  $Therapeutic Activity: 8-22 mins $[place] Rep Proc: 8-22 mins                    G Codes:      INGOLD,Petros Ahart 2013-10-24, 11:36 AM [facility] [phone] (pager)

## 2013-10-03 NOTE — Progress Notes (Addendum)
CSW (Clinical Education officer, museum) prepared pt dc packet and placed with shadow chart. CSW arranged non-emergent ambulance transport for 1pm. Pt, pt daughter [name], pt nurse, and facility informed. CSW signing off.  Suzanne [name], [place]

## 2013-10-03 NOTE — Discharge Summary (Signed)
Agree with plans for discharge.  [name], MD, [place] [phone] 10/03/2013

## 2013-10-04 ENCOUNTER — Non-Acute Institutional Stay (SKILLED_NURSING_FACILITY): Payer: Medicare Other | Admitting: Internal Medicine

## 2013-10-04 DIAGNOSIS — D62 Acute posthemorrhagic anemia: Secondary | ICD-10-CM

## 2013-10-04 DIAGNOSIS — I509 Heart failure, unspecified: Secondary | ICD-10-CM

## 2013-10-04 DIAGNOSIS — I5032 Chronic diastolic (congestive) heart failure: Secondary | ICD-10-CM

## 2013-10-04 DIAGNOSIS — E1059 Type 1 diabetes mellitus with other circulatory complications: Secondary | ICD-10-CM

## 2013-10-04 DIAGNOSIS — I739 Peripheral vascular disease, unspecified: Secondary | ICD-10-CM

## 2013-10-06 NOTE — Progress Notes (Signed)
HISTORY & PHYSICAL  DATE: 10/04/2013   FACILITY: [facility]  LEVEL OF CARE: SNF (31)  ALLERGIES:  Allergies  Allergen Reactions  . Codeine Other (See Comments)    REACTION: hallucinations  . Hydrochlorothiazide Other (See Comments)    REACTION: hypotension  . Pregabalin Other (See Comments)    REACTION: unknown  . Other Rash    Adhesives and bandages gives pt a rash.    CHIEF COMPLAINT:  Manage PVD, diabetes mellitus and CHF  HISTORY OF PRESENT ILLNESS: Patient is an 80 year old Caucasian female who is admitted to this facility for short-term rehabilitation after her recent hospitalization.  PVD: The patient's peripheral vascular disease remains table. The patient denies ongoing claudication. No complications reported from the medication(s) currently being used. pt has a history of right femoral tibial bypass graft which became occluded and she had ischemia of the right foot. She underwent right above knee amputation and tolerated the procedure well. She denies stump pain. She is tolerating her pain medications without any side effects.  CHF:The patient does not relate significant weight changes, denies sob, DOE, orthopnea, PNDs, pedal edema, palpitations or chest pain.  CHF remains stable.  No complications form the medications being used.  DM:pt's DM remains stable.  Pt denies polyuria, polydipsia, polyphagia, changes in vision or hypoglycemic episodes.  No complications noted from the medication presently being used.  Last hemoglobin A1c is: Not available.  PAST MEDICAL HISTORY :  Past Medical History  Diagnosis Date  . PVD (peripheral vascular disease)     a. PTA & stent right superficial artery PTA & left common iliac artery stenting. Right fem-pop bypass graft. b. Per Dr. Kennon Holter note: h/o aortobifemoral bypass grafting remotely as well as fem-peroneal bypass grafting.  . Mixed hyperlipidemia   . Compression fx, thoracic spine     T12  .  Ischium fracture   . Memory loss, short term   . Pulmonary nodule   . Chronic diastolic CHF (congestive heart failure) 09-2008    a. EF 40% by cath 2010. b. Improved - last echo 2015 with normal EF.  [name] Anemia   . Detached retina 02-2005    R EYE  . Fracture 02-2007    L5 AND PELVIS  . Left ventricular dysfunction     a. EF 40% by cath 2010. b. Improved - last echo 2015 with normal EF.   [name] AAA (abdominal aortic aneurysm)     Repair unknown date  . Critical lower limb ischemia   . Chronic respiratory failure     a. On home O2.  [name] Hypertension   . Warfarin anticoagulation     DC summary from 2010 reports she is "on chronic  Coumadin for vascular disease." This is listed on her med list going back to 2006 by records available. Teaching service H&P remotely says she has h/o DVT but I cannot find any evidence of this. Admission H/P 03/2013 suggests history of atrial fib but all ECGs appear sinus and there is no mention of this previously.  [name] COPD (chronic obstructive pulmonary disease)   . Type II or unspecified type diabetes mellitus without mention of complication, not stated as uncontrolled   . Complication of anesthesia     hard to wake up, bp problems   . Pneumonia     hx  . GERD (gastroesophageal reflux disease)   . Arthritis   . CAD (coronary artery disease) 10/12/2008    a.  30% LAD,30% CX & 40% tandem mid stenosis in the AV groove, 50-60% RCA  . Renal artery stenosis     a. 1-59% by doppler in 2014.; CKD stage III (Dr. Erling Cruz)  . On home oxygen therapy     "2.5L O2 at sleep or w/increased activity" (09/27/2013)    PAST SURGICAL HISTORY: Past Surgical History  Procedure Laterality Date  . Abdominal aortic aneurysm repair  98    "lower left ventricle"  . Aorto-femoral bypass graft Bilateral     S/P aortobifemoral bypass grafting and right fem-peroneal bypass grafting remotely  . Cholecystectomy    . Cataract extraction Right 06-2006  . Vascular surgery    . Cardiac  catheterization    . Retinal detachment surgery Right     "put buckle in; it popped & they had to remove it"  . Eye surgery    . Patch angioplasty Left 04/19/2013    Procedure: VEIN PATCH ANGIOPLASTY - LEFT COMMON FEMORAL ARTERY;  Surgeon: Angelia Mould, MD;  Location: [place];  Service: Vascular;  Laterality: Left;  . Femoral-popliteal bypass graft Left 04/19/2013    Procedure: BYPASS GRAFT FEMORAL- BELOW KNEE POPLITEAL ARTERY WITH VEIN;  Surgeon: Angelia Mould, MD;  Location: [place];  Service: Vascular;  Laterality: Left;  . Fracture surgery      sternum  2008 no surgery  . Above knee leg amputation Right 09/27/2013  . Femoral-peroneal bypass graft    . Aorto-femoral bypass graft    . Patella fracture surgery Right ~ 1987    "shattered; work related injury"  . Total abdominal hysterectomy    . Amputation Right 09/27/2013    Procedure: AMPUTATION ABOVE KNEE-RIGHT;  Surgeon: Angelia Mould, MD;  Location: [place];  Service: Vascular;  Laterality: Right;    SOCIAL HISTORY:  reports that she quit smoking about 16 years ago. Her smoking use included Cigarettes. She has a 80 pack-year smoking history. She has never used smokeless tobacco. She reports that she does not drink alcohol or use illicit drugs.  FAMILY HISTORY:  Family History  Problem Relation Age of Onset  . Tuberculosis Mother   . Cancer Father     CURRENT MEDICATIONS: Reviewed per MAR/see medication list  REVIEW OF SYSTEMS:  See HPI otherwise 14 point ROS is negative.  PHYSICAL EXAMINATION  VS:  See VS section  GENERAL: no acute distress, normal body habitus EYES: conjunctivae normal, sclerae normal, normal eye lids MOUTH/THROAT: lips without lesions,no lesions in the mouth,tongue is without lesions,uvula elevates in midline NECK: supple, trachea midline, no neck masses, no thyroid tenderness, no thyromegaly LYMPHATICS: no LAN in the neck, no supraclavicular LAN RESPIRATORY: breathing is even &  unlabored, BS CTAB CARDIAC: RRR, no murmur,no extra heart sounds, no edema GI:  ABDOMEN: abdomen soft, normal BS, no masses, no tenderness  LIVER/SPLEEN: no hepatomegaly, no splenomegaly MUSCULOSKELETAL: HEAD: normal to inspection  EXTREMITIES: LEFT UPPER EXTREMITY: full range of motion, normal strength & tone RIGHT UPPER EXTREMITY:  full range of motion, normal strength & tone LEFT LOWER EXTREMITY:  full range of motion, normal strength & tone RIGHT LOWER EXTREMITY:  Status post AKA PSYCHIATRIC: the patient is alert & oriented to person, affect & behavior appropriate  LABS/RADIOLOGY:  Labs reviewed: Basic Metabolic Panel:  Recent Labs  09/29/13 0330 10/01/13 1520 10/03/13 0400  NA 139 136* 136*  K 4.7 4.3 4.2  CL 102 100 99  CO2 24 23 23   GLUCOSE 133* 169* 119*  BUN 45*  59* 59*  CREATININE 1.95* 2.37* 2.21*  CALCIUM 9.1 8.6 9.0   Liver Function Tests:  Recent Labs  11/16/12 1434 01/25/13 1915 04/18/13 1454  AST 22 26 23   ALT 14 20 15   ALKPHOS 46 48 55  BILITOT 0.3 0.4 0.3  PROT 6.6 6.4 7.4  ALBUMIN 3.9 3.0* 3.5    Recent Labs  01/25/13 1915  LIPASE 45   CBC:  Recent Labs  11/16/12 1434  01/25/13 1915  01/27/13 0835  09/29/13 0330 10/01/13 1520 10/03/13 0400  WBC 8.6  < > 10.9*  < > 6.3  < > 8.6 8.5 12.8*  NEUTROABS 5.4  --  7.4  --  3.5  --   --   --   --   HGB 10.4*  < > 10.2*  < > 9.6*  < > 8.1* 7.1* 7.3*  HCT 30.5*  < > 31.6*  < > 29.8*  < > 25.7* 22.4* 22.5*  MCV 93.3  < > 98.1  < > 97.4  < > 95.5 95.3 94.5  PLT 218  --  151  < > 147*  < > 203 236 262  < > = values in this interval not displayed.  CBG:  Recent Labs  10/02/13 2054 10/03/13 0622 10/03/13 1120  GLUCAP 134* 112* 137*    Transthoracic Echocardiography  Patient:    Suzanne [name], Suzanne [name] MR #:       [id number] Study Date: 03/31/2013 Gender:     F Age:        43 Height:     157.5cm Weight:     64kg BSA:        1.81m^2 Pt. Status: Room:    ORDERING     Quay Burow,  MD  REFERRING    Quay Burow, MD  ATTENDING    Lyman Bishop  SONOGRAPHER  Marygrace Drought, RCS  PERFORMING   [facility], Outpatient cc:  ------------------------------------------------------------ LV EF: 65% -   70%  ------------------------------------------------------------ Indications:      414.01 Coronary atherosclerosis - native artery.  ------------------------------------------------------------ History:   PMH:  PVD, Renal artery stenosis  Chronic obstructive pulmonary disease.  ------------------------------------------------------------ Study Conclusions  - Left ventricle: The cavity size was normal. Systolic   function was vigorous. The estimated ejection fraction was   in the range of 65% to 70%. Wall motion was normal; there   were no regional wall motion abnormalities. Doppler   parameters are consistent with abnormal left ventricular   relaxation (grade 1 diastolic dysfunction). - Aortic valve: Valve area: 1.54cm^2(VTI). Valve area:   1.5cm^2 (Vmax). - Mitral valve: Calcified annulus. Mild regurgitation. Valve   area by continuity equation (using LVOT flow): 1.97cm^2. - Left atrium: The atrium was mildly dilated.   Anterior-posterior dimension: 40mm (2D). Impressions:  - No significant change from prior ECHO. EF is more vigorous   on current study. Transthoracic echocardiography.  M-mode, complete 2D, spectral Doppler, and color Doppler.  Height:  Height: 157.5cm. Height: 62in.  Weight:  Weight: 64kg. Weight: 140.7lb.  Body mass index:  BMI: 25.8kg/m^2.  Body surface area:    BSA: 1.40m^2.  Blood pressure:     122/66.  Patient status:  Outpatient.  Location:  Echo laboratory.  ------------------------------------------------------------  ------------------------------------------------------------ Left ventricle:  The cavity size was normal. Systolic function was vigorous. The estimated ejection fraction was in the range of 65% to 70%. Wall motion was  normal; there were no regional wall motion abnormalities. Doppler parameters are consistent with abnormal left ventricular  relaxation (grade 1 diastolic dysfunction).  ------------------------------------------------------------ Aortic valve:   Mildly thickened, moderately calcified leaflets. Cusp separation was normal.  Doppler: Transvalvular velocity was within the normal range. There was no stenosis.  No regurgitation.    VTI ratio of LVOT to aortic valve: 0.49. Valve area: 1.54cm^2(VTI). Indexed valve area: 0.93cm^2/m^2 (VTI). Peak velocity ratio of LVOT to aortic valve: 0.48. Valve area: 1.5cm^2 (Vmax). Indexed valve area: 0.91cm^2/m^2 (Vmax).    Mean gradient: 77mm Hg (S). Peak gradient: 56mm Hg (S).  ------------------------------------------------------------ Aorta:  The aorta was not dilated, mildly calcified, and non-diseased.  ------------------------------------------------------------ Mitral valve:   Calcified annulus. Leaflet separation was normal.  Doppler:  Transvalvular velocity was within the normal range. There was no evidence for stenosis.  Mild regurgitation.    Valve area by pressure half-time: 3.1cm^2. Indexed valve area by pressure half-time: 1.88cm^2/m^2. Valve area by continuity equation (using LVOT flow): 1.97cm^2. Indexed valve area by continuity equation (using LVOT flow): 1.19cm^2/m^2.    Mean gradient: 65mm Hg (D). Peak gradient: 91mm Hg (D).  ------------------------------------------------------------ Left atrium:  LA volume/ BSA = 27.9 ml/m2 The atrium was mildly dilated.  ------------------------------------------------------------ Pulmonic valve:   Poorly visualized.  Structurally normal valve.   Cusp separation was normal.  Doppler: Transvalvular velocity was within the normal range.  No regurgitation.  ------------------------------------------------------------ Tricuspid valve:   Structurally normal valve.   Leaflet separation was normal.   Doppler:  Transvalvular velocity was within the normal range.  Trivial regurgitation.  ------------------------------------------------------------ Pulmonary artery:   The main pulmonary artery was normal-sized.  ------------------------------------------------------------ Right atrium:  The atrium was normal in size.  ------------------------------------------------------------ Pericardium:  The pericardium was normal in appearance. There was no pericardial effusion.  ------------------------------------------------------------ Systemic veins: Inferior vena cava: The vessel was normal in size; the respirophasic diameter changes were in the normal range (= 50%); findings are consistent with normal central venous pressure. Diameter: 67mm.  ------------------------------------------------------------ Post procedure conclusions Ascending Aorta:  - The aorta was not dilated, mildly calcified, and   non-diseased.  ------------------------------------------------------------  2D measurements        Normal  Doppler measurements   Normal IVC                            Main pulmonary Diam          8 mm     ------  artery Left ventricle                 Pressure,    13 mm Hg  =30 LVID ED,   38.7 mm     43-52   S chord,                         Left ventricle PLAX                           Ea, lat     8.1 cm/s   ------ LVID ES,   27.6 mm     23-38   ann, tiss chord,                         DP PLAX                           E/Ea, lat  10.4        ------ FS,  chord,   29 %      >29     ann, tiss     8 PLAX                           DP LVPW, ED   11.6 mm     ------  Ea, med     5.7 cm/s   ------ IVS/LVPW   0.76        <1.3    ann, tiss ratio, ED                      DP Ventricular septum             E/Ea, med  14.8        ------ IVS, ED    8.82 mm     ------  ann, tiss     9 LVOT                           DP Diam, S      20 mm     ------  LVOT Area       3.14 cm^2   ------  Peak vel,   85.1 cm/s   ------ Aorta                          S Root diam,   32 mm     ------  VTI, S     21.7 cm     ------ ED                             Aortic valve Left atrium                    Peak vel,   178 cm/s   ------ AP dim       41 mm     ------  S AP dim     2.48 cm/m^2 <2.2    Mean vel,   126 cm/s   ------ index                          S Right ventricle                VTI, S     44.2 cm     ------ RVID ED,   32.9 mm     19-38   Mean          7 mm Hg  ------ PLAX                           gradient,                                S                                Peak         13 mm Hg  ------                                gradient,  S                                VTI ratio  0.49        ------                                LVOT/AV                                Area, VTI  1.54 cm^2   ------                                Area index 0.93 cm^2/m ------                                (VTI)           ^2                                Peak vel   0.48        ------                                ratio,                                LVOT/AV                                Area, Vmax  1.5 cm^2   ------                                Area index 0.91 cm^2/m ------                                (Vmax)          ^2                                Mitral valve                                Peak E vel 84.9 cm/s   ------                                Peak A vel  106 cm/s   ------                                Mean vel,  65.7 cm/s   ------  D                                Decelerati  261 ms     150-23                                on time                0                                Pressure     71 ms     ------                                half-time                                Mean          2 mm Hg  ------                                gradient,                                D                                Peak           6 mm Hg  ------                                gradient,                                D                                Peak E/A    0.8        ------                                ratio                                Area (PHT)  3.1 cm^2   ------                                Area index 1.88 cm^2/m ------                                (PHT)           ^2  Area       1.97 cm^2   ------                                (LVOT)                                continuity                                Area index 1.19 cm^2/m ------                                (LVOT           ^2                                cont)                                Annulus    34.6 cm     ------                                VTI                                Tricuspid valve                                Regurg      161 cm/s   ------                                peak vel                                Peak RV-RA   10 mm Hg  ------                                gradient,                                S                                Systemic veins                                Estimated     3 mm Hg  ------                                CVP  Right ventricle                                Pressure,    13 mm Hg  <30                                S                                Sa vel,    14.1 cm/s   ------                                lat ann,                                tiss DP    ASSESSMENT/PLAN:  PVD-status post right AKA. Continue rehabilitation CHF-compensated Diabetes mellitus with vascular complications-continue Lantus Acute blood loss anemia-recheck hemoglobin  COPD-compensated CAD-stable Hypertension-well controlled Check CBC and BMP  I have reviewed patient's medical records received at admission/from hospitalization.  CPT CODE: 47829  Darrion Wyszynski Y Liberti App[place], [place] [phone]

## 2013-10-14 ENCOUNTER — Non-Acute Institutional Stay (SKILLED_NURSING_FACILITY): Payer: Medicare Other | Admitting: Adult Health

## 2013-10-14 ENCOUNTER — Encounter: Payer: Self-pay | Admitting: Adult Health

## 2013-10-14 DIAGNOSIS — N390 Urinary tract infection, site not specified: Secondary | ICD-10-CM

## 2013-10-14 DIAGNOSIS — S92902A Unspecified fracture of left foot, initial encounter for closed fracture: Secondary | ICD-10-CM

## 2013-10-14 DIAGNOSIS — S92909A Unspecified fracture of unspecified foot, initial encounter for closed fracture: Secondary | ICD-10-CM

## 2013-10-14 NOTE — Progress Notes (Signed)
Patient ID: Suzanne [name], female   DOB: [birth year], 80 y.o.   MRN: [id number]         PROGRESS NOTE  DATE: 10/14/2013  FACILITY:  [facility]  LEVEL OF CARE: SNF (31)  Acute Visit  CHIEF COMPLAINT:  Manage UTI and left foot fracture  HISTORY OF PRESENT ILLNESS: This is an 80 year old who was complaining of dysuria without hematuria. Urine culture shows >100,000 CFU/ml E. Coli. She complained of left foot pain so x-ray was done. X-ray shows acute-appearing fifth metatarsal base fracture.  PAST MEDICAL HISTORY : Reviewed.  No changes/see problem list  CURRENT MEDICATIONS: Reviewed per MAR/see medication list  REVIEW OF SYSTEMS:  GENERAL: no change in appetite, no fatigue, no weight changes, no fever, chills or weakness RESPIRATORY: no cough, SOB, DOE,, wheezing, hemoptysis CARDIAC: no chest pain, edema or palpitations GI: no abdominal pain, diarrhea, constipation, heart burn, nausea or vomiting  PHYSICAL EXAMINATION  GENERAL: no acute distress, normal body habitus EYES: conjunctivae normal, sclerae normal, normal eye lids NECK: supple, trachea midline, no neck masses, no thyroid tenderness, no thyromegaly LYMPHATICS: no LAN in the neck, no supraclavicular LAN RESPIRATORY: breathing is even & unlabored, BS CTAB CARDIAC: RRR, no murmur,no extra heart sounds, no edema GI: abdomen soft, normal BS, no masses, no tenderness, no hepatomegaly, no splenomegaly EXTREMITIES:  Right AKA, left foot with slight redness on anterior left outer foot PSYCHIATRIC: the patient is alert & oriented to person, affect & behavior appropriate  LABS/RADIOLOGY: 10/05/13  WBC 10.1 hemoglobin 7.2 hematocrit 24.0 MCV 99.2 sodium 144 potassium 4.2 glucose 98 BUN 62 creatinine 1.9 calcium 9.2  ASSESSMENT/PLAN:  UTI - start Macrobid 100 mg by mouth twice a day x7 days Left foot fracture - immobilized left foot with splint and Ace wrap until seen by orthopedic  CPT CODE: 46286  Monina  Vargas - NP [facility] [phone]

## 2013-10-18 ENCOUNTER — Other Ambulatory Visit: Payer: Self-pay | Admitting: Physician Assistant

## 2013-10-19 ENCOUNTER — Ambulatory Visit: Payer: Medicare Other | Admitting: Vascular Surgery

## 2013-10-19 NOTE — Telephone Encounter (Signed)
Dr Everlene Farrier at 08/04/13 OV you wanted pt to decrease Lantus by another 2 units. At start and end of OV the units were both at 14 units QD. Did you want the dose to be 12 units now? I wanted to correct the sig on RFs since it still says 16 units.

## 2013-10-20 ENCOUNTER — Telehealth: Payer: Self-pay

## 2013-10-20 NOTE — Telephone Encounter (Addendum)
Dr.daubm Pts daughter [name] would like for you to write a note stating that the pt is wheelchair bound, and will need a handicap accessible apartment. Pt is unable to break her current lease until a note a written from you.  Best# [phone]

## 2013-10-21 NOTE — Telephone Encounter (Signed)
Letter has been drafted.  Pt notified- pt daughter will pick up today.  Letter in pick up drawer.

## 2013-10-21 NOTE — Telephone Encounter (Signed)
Please go ahead and write a note. Patient is status post amputation of her leg for severe peripheral arterial disease and she absolutely is wheelchair-bound and needs the appropriate apartment

## 2013-10-25 ENCOUNTER — Encounter: Payer: Self-pay | Admitting: Vascular Surgery

## 2013-10-26 ENCOUNTER — Ambulatory Visit (INDEPENDENT_AMBULATORY_CARE_PROVIDER_SITE_OTHER): Payer: Medicare Other | Admitting: Vascular Surgery

## 2013-10-26 ENCOUNTER — Encounter: Payer: Self-pay | Admitting: Vascular Surgery

## 2013-10-26 VITALS — BP 143/59 | HR 79 | Resp 20 | Ht 62.0 in | Wt 128.2 lb

## 2013-10-26 DIAGNOSIS — Z48812 Encounter for surgical aftercare following surgery on the circulatory system: Secondary | ICD-10-CM

## 2013-10-26 DIAGNOSIS — I739 Peripheral vascular disease, unspecified: Secondary | ICD-10-CM

## 2013-10-26 NOTE — Progress Notes (Signed)
POST OPERATIVE OFFICE NOTE    CC:  F/u for surgery  HPI:  This is a 80 y.o. female who had undergone a right femoral to anterior tibial artery bypass grafting in 2009 by Dr. Amedeo Plenty.  She has also had aortobifemoral bypass grafting in the past.  She did have progressing pain in the right foot foot.  She was seen by Dr. Stanford Breed office and was found to have significant ischemia.  She did not have any good options for revascularization and a right AKA was recommended.  She subsequently underwent a right AKA on 09/27/13 by Dr. Scot Dock.  She presents today for follow up.  She states that she does still have tenderness in the area of the stump.  She does have a left non displaced 5th metatarsal fracture after a fall a couple of weeks ago and this has hindered her rehabilitation, but they do continue to work with her.  She states that she does have good ROM of the right stump.  Allergies  Allergen Reactions  . Codeine Other (See Comments)    REACTION: hallucinations  . Hydrochlorothiazide Other (See Comments)    REACTION: hypotension  . Pregabalin Other (See Comments)    REACTION: unknown  . Other Rash    Adhesives and bandages gives pt a rash.    Current Outpatient Prescriptions  Medication Sig Dispense Refill  . acetaminophen (TYLENOL) 500 MG tablet Take 1,000 mg by mouth every 4 (four) hours as needed for moderate pain.       [name] albuterol (PROVENTIL) (2.5 MG/3ML) 0.083% nebulizer solution Take 2.5 mg by nebulization 3 (three) times daily as needed for wheezing or shortness of breath.       [name] aspirin EC 325 MG EC tablet Take 1 tablet (325 mg total) by mouth daily.      . budesonide-formoterol (SYMBICORT) 160-4.5 MCG/ACT inhaler Inhale 2 puffs into the lungs 2 (two) times daily.      . Calcium Carb-Cholecalciferol (CALCIUM 600 + D PO) Take 1 tablet by mouth daily.      [name] esomeprazole (NEXIUM) 40 MG capsule Take 40 mg by mouth daily before breakfast.      . furosemide (LASIX) 40 MG tablet Take  40 mg by mouth daily.      [name] HYDROcodone-acetaminophen (NORCO) 7.5-325 MG per tablet Take 1 tablet by mouth every 4 (four) hours as needed. Take one half tablet as needed for pain every 6 hours      . HYDROcodone-acetaminophen (NORCO) 7.5-325 MG per tablet Take 1 tablet by mouth every 4 (four) hours as needed for moderate pain.  30 tablet  0  . ibandronate (BONIVA) 150 MG tablet Take 150 mg by mouth every 30 (thirty) days. Take in the morning with a full glass of water, on an empty stomach, and do not take anything else by mouth or lie down for the next 30 min. (take on the 25th of each month)      . Insulin Glargine (LANTUS SOLOSTAR) 100 UNIT/ML Solostar Pen Inject 12 Units into the skin daily.  15 mL  0  . insulin glargine (LANTUS) 100 UNIT/ML injection Inject 12 Units into the skin at bedtime.       . Ipratropium-Albuterol (COMBIVENT RESPIMAT) 20-100 MCG/ACT AERS respimat Inhale 2 puffs into the lungs every 6 (six) hours as needed for wheezing or shortness of breath.       . lidocaine (LIDODERM) 5 % Place 1 patch onto the skin daily. Remove & Discard patch within  12 hours or as directed by MD      . Omega-3 Fatty Acids (FISH OIL) 1200 MG CAPS Take 1,200 mg by mouth 2 (two) times daily.      . ranitidine (ZANTAC) 150 MG tablet Take 150 mg by mouth 2 (two) times daily.      . rosuvastatin (CRESTOR) 20 MG tablet Take 20 mg by mouth daily.       [name] losartan (COZAAR) 100 MG tablet Take 100 mg by mouth daily.      . metoprolol succinate (TOPROL-XL) 25 MG 24 hr tablet Take 25 mg by mouth daily.        No current facility-administered medications for this visit.     ROS:  See HPI  Physical Exam:  Filed Vitals:   10/26/13 1311  BP: 143/59  Pulse: 79  Resp: 20    Incision:  C/d/i with staples in tact.  Extremities:  Good range of motion of right stump.  Lidocaine patch proximal and anterior to incision.  Small wound to tip of left great toe   A/P:  This is a 80 y.o. female here for f/u for  right AKA done on 09/27/13 by Dr. Scot Dock.  -pt is doing well and incision is healing nicely.   -will remove staples today -she can continue rehab as recommended given her left foot fracture. -she will f/u in 3 months with Dr. Scot Dock with ABI's   Leontine Locket, PA-C [facility] [phone]  Clinic MD:  Pt seen and examined with Dr. Scot Dock

## 2013-10-27 ENCOUNTER — Ambulatory Visit: Payer: Medicare Other | Admitting: Emergency Medicine

## 2013-11-16 ENCOUNTER — Encounter: Payer: Self-pay | Admitting: Adult Health

## 2013-11-16 ENCOUNTER — Non-Acute Institutional Stay (SKILLED_NURSING_FACILITY): Payer: Medicare Other | Admitting: Adult Health

## 2013-11-16 DIAGNOSIS — I739 Peripheral vascular disease, unspecified: Secondary | ICD-10-CM

## 2013-11-16 DIAGNOSIS — E782 Mixed hyperlipidemia: Secondary | ICD-10-CM

## 2013-11-16 DIAGNOSIS — I509 Heart failure, unspecified: Secondary | ICD-10-CM

## 2013-11-16 DIAGNOSIS — I2510 Atherosclerotic heart disease of native coronary artery without angina pectoris: Secondary | ICD-10-CM

## 2013-11-16 DIAGNOSIS — J449 Chronic obstructive pulmonary disease, unspecified: Secondary | ICD-10-CM

## 2013-11-16 DIAGNOSIS — I5032 Chronic diastolic (congestive) heart failure: Secondary | ICD-10-CM

## 2013-11-16 DIAGNOSIS — IMO0001 Reserved for inherently not codable concepts without codable children: Secondary | ICD-10-CM

## 2013-11-16 DIAGNOSIS — M810 Age-related osteoporosis without current pathological fracture: Secondary | ICD-10-CM

## 2013-11-16 DIAGNOSIS — I10 Essential (primary) hypertension: Secondary | ICD-10-CM

## 2013-11-16 DIAGNOSIS — IMO0002 Reserved for concepts with insufficient information to code with codable children: Secondary | ICD-10-CM

## 2013-11-16 DIAGNOSIS — E1165 Type 2 diabetes mellitus with hyperglycemia: Secondary | ICD-10-CM

## 2013-11-16 DIAGNOSIS — J4489 Other specified chronic obstructive pulmonary disease: Secondary | ICD-10-CM

## 2013-11-16 NOTE — Progress Notes (Signed)
Patient ID: Suzanne [name], female   DOB: December 08, 1921, 80 y.o.   MRN: [id number]              PROGRESS NOTE  DATE: 11/16/2013   FACILITY: [facility]  LEVEL OF CARE: SNF (31)  Acute Visit  Discharge Notes  HISTORY OF PRESENT ILLNESS:   This is an 80 year old female who is for discharge home with home health PT, OT, nursing and CNA. DME: Wheelchair with anti-tippers, cushion, leg rests, retractable handrails and sliding board. She has been admitted to [facility] on 10/03/13 from [facility] with PVD status post right above-the-knee amputation.  Patient was admitted to this facility for short-term rehabilitation after the patient's recent hospitalization.  Patient has completed SNF rehabilitation and therapy has cleared the patient for discharge.  Reassessment of ongoing problem(s):  HTN: Pt 's HTN remains stable.  Denies CP, sob, DOE, pedal edema, headaches, dizziness or visual disturbances.  No complications from the medications currently being used.  Last BP : 117/60  CAD: The angina has been stable. The patient denies dyspnea on exertion, orthopnea, pedal edema, palpitations and paroxysmal nocturnal dyspnea. No complications noted from the medication presently being used.  CHF:The patient does not relate significant weight changes, denies sob, DOE, orthopnea, PNDs, pedal edema, palpitations or chest pain.  CHF remains stable.  No complications form the medications being used.   Past Medical History  Diagnosis Date  . PVD (peripheral vascular disease)     a. PTA & stent right superficial artery PTA & left common iliac artery stenting. Right fem-pop bypass graft. b. Per Dr. Kennon Holter note: h/o aortobifemoral bypass grafting remotely as well as fem-peroneal bypass grafting.  . Mixed hyperlipidemia   . Compression fx, thoracic spine     T12  . Ischium fracture   . Memory loss, short term   . Pulmonary nodule   . Chronic diastolic CHF (congestive heart failure)  09-2008    a. EF 40% by cath 2010. b. Improved - last echo 2015 with normal EF.  [name] Anemia   . Detached retina 02-2005    R EYE  . Fracture 02-2007    L5 AND PELVIS  . Left ventricular dysfunction     a. EF 40% by cath 2010. b. Improved - last echo 2015 with normal EF.   [name] AAA (abdominal aortic aneurysm)     Repair unknown date  . Critical lower limb ischemia   . Chronic respiratory failure     a. On home O2.  [name] Hypertension   . Warfarin anticoagulation     DC summary from 2010 reports she is "on chronic  Coumadin for vascular disease." This is listed on her med list going back to 2006 by records available. Teaching service H&P remotely says she has h/o DVT but I cannot find any evidence of this. Admission H/P 03/2013 suggests history of atrial fib but all ECGs appear sinus and there is no mention of this previously.  [name] COPD (chronic obstructive pulmonary disease)   . Type II or unspecified type diabetes mellitus without mention of complication, not stated as uncontrolled   . Complication of anesthesia     hard to wake up, bp problems   . Pneumonia     hx  . GERD (gastroesophageal reflux disease)   . Arthritis   . CAD (coronary artery disease) 10/12/2008    a. 30% LAD,30% CX & 40% tandem mid stenosis in the AV groove, 50-60% RCA  .  Renal artery stenosis     a. 1-59% by doppler in 2014.; CKD stage III (Dr. Erling Cruz)  . On home oxygen therapy     "2.5L O2 at sleep or w/increased activity" (09/27/2013)     Reviewed.  No changes/see problem list  CURRENT MEDICATIONS: Reviewed per MAR/see medication list    Allergies  Allergen Reactions  . Codeine Other (See Comments)    REACTION: hallucinations  . Hydrochlorothiazide Other (See Comments)    REACTION: hypotension  . Pregabalin Other (See Comments)    REACTION: unknown  . Other Rash    Adhesives and bandages gives pt a rash.     REVIEW OF SYSTEMS:  GENERAL: no change in appetite, no fatigue, no weight changes, no fever,  chills or weakness RESPIRATORY: no cough, SOB, DOE, wheezing, hemoptysis CARDIAC: no chest pain, edema or palpitations GI: no abdominal pain, diarrhea, constipation, heart burn, nausea or vomiting  PHYSICAL EXAMINATION  GENERAL: no acute distress, normal body habitus EYES: conjunctivae normal, sclerae normal, normal eye lids NECK: supple, trachea midline, no neck masses, no thyroid tenderness, no thyromegaly RESPIRATORY: breathing is even & unlabored, BS CTAB CARDIAC: RRR, no murmur,no extra heart sounds, no edema GI: abdomen soft, normal BS, no masses, no tenderness, no hepatomegaly, no splenomegaly EXTREMITIES: Right AKA; able to move left lower extremity and bilateral upper extremity PSYCHIATRIC: the patient is alert & oriented to person, affect & behavior appropriate  LABS/RADIOLOGY: 10/05/13  sodium 144 potassium 4.2 glucose 98 BUN 62 creatinine 1.9 calcium 9.2 WBC 10.1 hemoglobin 7.2 hematocrit 24.0 MCV 99.2 Labs reviewed: Basic Metabolic Panel:  Recent Labs  09/29/13 0330 10/01/13 1520 10/03/13 0400  NA 139 136* 136*  K 4.7 4.3 4.2  CL 102 100 99  CO2 24 23 23   GLUCOSE 133* 169* 119*  BUN 45* 59* 59*  CREATININE 1.95* 2.37* 2.21*  CALCIUM 9.1 8.6 9.0   Liver Function Tests:  Recent Labs  01/25/13 1915 04/18/13 1454  AST 26 23  ALT 20 15  ALKPHOS 48 55  BILITOT 0.4 0.3  PROT 6.4 7.4  ALBUMIN 3.0* 3.5    Recent Labs  01/25/13 1915  LIPASE 45   CBC:  Recent Labs  01/25/13 1915  01/27/13 0835  09/29/13 0330 10/01/13 1520 10/03/13 0400  WBC 10.9*  < > 6.3  < > 8.6 8.5 12.8*  NEUTROABS 7.4  --  3.5  --   --   --   --   HGB 10.2*  < > 9.6*  < > 8.1* 7.1* 7.3*  HCT 31.6*  < > 29.8*  < > 25.7* 22.4* 22.5*  MCV 98.1  < > 97.4  < > 95.5 95.3 94.5  PLT 151  < > 147*  < > 203 236 262  < > = values in this interval not displayed.  CBG:  Recent Labs  10/02/13 2054 10/03/13 0622 10/03/13 1120  GLUCAP 134* 112* 137*     ASSESSMENT/PLAN:   PVD  status post right AKA - for  home health PT, OT, nursing and CNA Hyperlipidemia - continue Lipitor Hypertension - well controlled; continue Cozaar and Toprol-XL Diabetes mellitus, type II -  Continue Lantus CAD - stable; continue Ecotrin Chronic diastolic CHF - stable; continue Lasix; check BMP COPD - compensated; continue Symbicort and Combivent Osteoporosis - continue Fosamax  I have filled out patient's discharge paperwork and written prescriptions.  Patient will receive home health PT, OT, Nursing and CNA.  DME provided: Wheelchair with anti-tippers, cushion, leg  rests, retractable handrails and sliding board  Total discharge time: Greater than 30 minutes  Discharge time involved coordination of the discharge process with social worker, nursing staff and therapy department. Medical justification for home health services/DME verified.  CPT CODE: 81448  MEDINA-VARGAS,Jaquay Posthumus, [facility] [phone]

## 2013-11-21 ENCOUNTER — Telehealth: Payer: Self-pay

## 2013-11-21 NOTE — Telephone Encounter (Signed)
Loraine from [facility] called to let us know that they will be visiting Suzanne [name] tomorrow.

## 2013-11-28 ENCOUNTER — Telehealth: Payer: Self-pay

## 2013-11-28 MED ORDER — ROSUVASTATIN CALCIUM 20 MG PO TABS: 20.0000 mg | ORAL_TABLET | Freq: Every day | ORAL | Status: DC

## 2013-11-28 NOTE — Telephone Encounter (Signed)
Pt states she needs a few of her cholesterol pills called in until she receives her meds from [facility].  Please call to [facility] [address]  Pt phone [phone]

## 2013-11-28 NOTE — Telephone Encounter (Signed)
Sent in #30 Crestor Pt advised.

## 2013-11-30 ENCOUNTER — Other Ambulatory Visit: Payer: Self-pay | Admitting: Radiology

## 2013-11-30 MED ORDER — ROSUVASTATIN CALCIUM 20 MG PO TABS
20.0000 mg | ORAL_TABLET | Freq: Every day | ORAL | Status: DC
Start: 2013-11-30 — End: 2014-01-20

## 2013-12-14 ENCOUNTER — Other Ambulatory Visit (HOSPITAL_COMMUNITY): Payer: Self-pay | Admitting: Cardiovascular Disease

## 2013-12-14 DIAGNOSIS — I701 Atherosclerosis of renal artery: Secondary | ICD-10-CM

## 2013-12-15 ENCOUNTER — Ambulatory Visit: Payer: Medicare Other | Admitting: Emergency Medicine

## 2013-12-16 ENCOUNTER — Encounter (HOSPITAL_COMMUNITY): Payer: Self-pay | Admitting: *Deleted

## 2013-12-16 ENCOUNTER — Encounter: Payer: Self-pay | Admitting: Emergency Medicine

## 2013-12-16 ENCOUNTER — Ambulatory Visit (INDEPENDENT_AMBULATORY_CARE_PROVIDER_SITE_OTHER): Payer: Medicare Other | Admitting: Emergency Medicine

## 2013-12-16 VITALS — BP 120/82 | HR 83 | Temp 97.8°F | Resp 16

## 2013-12-16 DIAGNOSIS — R05 Cough: Secondary | ICD-10-CM

## 2013-12-16 DIAGNOSIS — I739 Peripheral vascular disease, unspecified: Secondary | ICD-10-CM

## 2013-12-16 DIAGNOSIS — I2510 Atherosclerotic heart disease of native coronary artery without angina pectoris: Secondary | ICD-10-CM

## 2013-12-16 DIAGNOSIS — I10 Essential (primary) hypertension: Secondary | ICD-10-CM

## 2013-12-16 DIAGNOSIS — D638 Anemia in other chronic diseases classified elsewhere: Secondary | ICD-10-CM

## 2013-12-16 DIAGNOSIS — H9193 Unspecified hearing loss, bilateral: Secondary | ICD-10-CM

## 2013-12-16 DIAGNOSIS — Z23 Encounter for immunization: Secondary | ICD-10-CM

## 2013-12-16 DIAGNOSIS — M79672 Pain in left foot: Secondary | ICD-10-CM

## 2013-12-16 DIAGNOSIS — R059 Cough, unspecified: Secondary | ICD-10-CM

## 2013-12-16 DIAGNOSIS — E118 Type 2 diabetes mellitus with unspecified complications: Secondary | ICD-10-CM

## 2013-12-16 LAB — GLUCOSE, POCT (MANUAL RESULT ENTRY): POC Glucose: 123 mg/dl — AB (ref 70–99)

## 2013-12-16 LAB — POCT GLYCOSYLATED HEMOGLOBIN (HGB A1C)

## 2013-12-16 MED ORDER — CALCIUM CARBONATE-VITAMIN D 600-400 MG-UNIT PO TABS: ORAL_TABLET | ORAL | Status: DC

## 2013-12-16 MED ORDER — ESOMEPRAZOLE MAGNESIUM 40 MG PO CPDR: 40.0000 mg | DELAYED_RELEASE_CAPSULE | Freq: Every day | ORAL | Status: AC

## 2013-12-16 MED ORDER — BUDESONIDE-FORMOTEROL FUMARATE 160-4.5 MCG/ACT IN AERO: INHALATION_SPRAY | RESPIRATORY_TRACT | Status: DC

## 2013-12-16 MED ORDER — ALCOHOL PREP 70 % PADS: MEDICATED_PAD | Status: DC

## 2013-12-16 MED ORDER — IBANDRONATE SODIUM 150 MG PO TABS: 150.0000 mg | ORAL_TABLET | ORAL | Status: DC

## 2013-12-16 MED ORDER — FISH OIL 1200 MG PO CAPS: 1200.0000 mg | ORAL_CAPSULE | Freq: Two times a day (BID) | ORAL | Status: DC

## 2013-12-16 MED ORDER — FUROSEMIDE 40 MG PO TABS: 40.0000 mg | ORAL_TABLET | Freq: Every day | ORAL | Status: AC

## 2013-12-16 MED ORDER — IPRATROPIUM-ALBUTEROL 20-100 MCG/ACT IN AERS: 2.0000 | INHALATION_SPRAY | Freq: Four times a day (QID) | RESPIRATORY_TRACT | Status: AC | PRN

## 2013-12-16 MED ORDER — RANITIDINE HCL 150 MG PO TABS: 150.0000 mg | ORAL_TABLET | Freq: Two times a day (BID) | ORAL | Status: DC

## 2013-12-16 MED ORDER — FERROUS SULFATE 325 (65 FE) MG PO TABS: 325.0000 mg | ORAL_TABLET | Freq: Every day | ORAL | Status: DC

## 2013-12-16 MED ORDER — "PEN NEEDLES 3/16"" 31G X 5 MM MISC": Status: DC

## 2013-12-16 NOTE — Progress Notes (Signed)
   Subjective:    Patient ID: Suzanne [name], female    DOB: August 02, 1921, 80 y.o.   MRN: [id number]  HPI patient and for follow-up. She has diabetes under control with Lantus insulin. She has chronic kidney disease followed by the nephrologist. She has a history of severe peripheral arterial disease which is followed by the vascular surgeons and Dr. Gwenlyn Found. Her cardiology follow-up is with Dr. Gwenlyn Found. Patient has had a dry nonproductive cough. She also would like referral to a hearing specialist.  Review of Systems     Objective:   Physical Exam  Constitutional: She appears well-developed and well-nourished.  Eyes: Pupils are equal, round, and reactive to light.  Neck: No thyromegaly present.  Cardiovascular:  Cardiac exam revealed a regular rate and rhythm no murmur was heard.  Pulmonary/Chest:  Breath sounds are diminished in both bases but no audible wheezes.  Musculoskeletal:  She has a right BK amputation the stump appears normal she has decreased vascular flow to the left foot with a small red area over the mid left third toe   Results for orders placed in visit on 12/16/13  GLUCOSE, POCT (MANUAL RESULT ENTRY)      Result Value Ref Range   POC Glucose 123 (*) 70 - 99 mg/dl  POCT GLYCOSYLATED HEMOGLOBIN (HGB A1C)      Result Value Ref Range   Hemoglobin A1C 6.0.           Assessment & Plan:  Sugar control is excellent. She has had no hypoglycemic episodes. No changes made she takes 12 units of Lantus a day. Her lung disease has been stable and she only uses oxygen at night. She does have a follow-up visit with a kidney specialist and also with the vascular surgeon. She is due to see Dr. Gwenlyn Found but does not want to have the renal artery flow study since her blood pressure is normal off of all blood pressure medications. Her ARB is on hold because of her renal dysfunction. She has had a cough chronic cough and this may be secondary to her osteoporosis therapy with [facility] and this was  placed on hold. Overall she looks great. Flu shot given.

## 2013-12-17 LAB — CBC WITH DIFFERENTIAL/PLATELET
Basophils Absolute: 0.1 10*3/uL (ref 0.0–0.1)
Basophils Relative: 2 % — ABNORMAL HIGH (ref 0–1)
EOS ABS: 0.5 10*3/uL (ref 0.0–0.7)
Eosinophils Relative: 8 % — ABNORMAL HIGH (ref 0–5)
HEMATOCRIT: 34 % — AB (ref 36.0–46.0)
Hemoglobin: 11.1 g/dL — ABNORMAL LOW (ref 12.0–15.0)
LYMPHS PCT: 35 % (ref 12–46)
Lymphs Abs: 2.2 10*3/uL (ref 0.7–4.0)
MCH: 29.3 pg (ref 26.0–34.0)
MCHC: 32.6 g/dL (ref 30.0–36.0)
MCV: 89.7 fL (ref 78.0–100.0)
MONO ABS: 0.7 10*3/uL (ref 0.1–1.0)
Monocytes Relative: 11 % (ref 3–12)
Neutro Abs: 2.8 10*3/uL (ref 1.7–7.7)
Neutrophils Relative %: 44 % (ref 43–77)
Platelets: 237 10*3/uL (ref 150–400)
RBC: 3.79 MIL/uL — AB (ref 3.87–5.11)
RDW: 13.5 % (ref 11.5–15.5)
WBC: 6.3 10*3/uL (ref 4.0–10.5)

## 2013-12-17 LAB — COMPLETE METABOLIC PANEL WITH GFR
ALT: 10 U/L (ref 0–35)
AST: 21 U/L (ref 0–37)
Albumin: 4.1 g/dL (ref 3.5–5.2)
Alkaline Phosphatase: 59 U/L (ref 39–117)
BUN: 39 mg/dL — AB (ref 6–23)
CALCIUM: 9.1 mg/dL (ref 8.4–10.5)
CHLORIDE: 103 meq/L (ref 96–112)
CO2: 26 mEq/L (ref 19–32)
Creat: 1.94 mg/dL — ABNORMAL HIGH (ref 0.50–1.10)
GFR, EST AFRICAN AMERICAN: 26 mL/min — AB
GFR, EST NON AFRICAN AMERICAN: 22 mL/min — AB
GLUCOSE: 107 mg/dL — AB (ref 70–99)
Potassium: 4.9 mEq/L (ref 3.5–5.3)
Sodium: 141 mEq/L (ref 135–145)
Total Bilirubin: 0.3 mg/dL (ref 0.2–1.2)
Total Protein: 6.5 g/dL (ref 6.0–8.3)

## 2013-12-19 NOTE — Telephone Encounter (Signed)
PT Suzanne [name] [phone]  Needs verbal order for home treatment 2 x week for next four weeks.

## 2013-12-19 NOTE — Telephone Encounter (Signed)
Gave verbal order for home care biweekly for the next 4 weeks.

## 2013-12-20 ENCOUNTER — Other Ambulatory Visit: Payer: Self-pay | Admitting: Radiology

## 2013-12-20 MED ORDER — FLUTICASONE-SALMETEROL 100-50 MCG/DOSE IN AEPB: 1.0000 | INHALATION_SPRAY | Freq: Two times a day (BID) | RESPIRATORY_TRACT | Status: DC

## 2013-12-28 ENCOUNTER — Ambulatory Visit: Payer: Medicare Other | Admitting: Vascular Surgery

## 2013-12-28 ENCOUNTER — Encounter (HOSPITAL_COMMUNITY): Payer: Medicare Other

## 2013-12-29 ENCOUNTER — Telehealth (HOSPITAL_COMMUNITY): Payer: Self-pay | Admitting: *Deleted

## 2013-12-30 ENCOUNTER — Other Ambulatory Visit: Payer: Self-pay | Admitting: Emergency Medicine

## 2013-12-30 ENCOUNTER — Telehealth: Payer: Self-pay | Admitting: *Deleted

## 2013-12-30 NOTE — Telephone Encounter (Signed)
Hassan Rowan returned call and stated last OV with them was May 2014 and nothing scheduled prior to end of 2015.

## 2013-12-30 NOTE — Telephone Encounter (Signed)
Phoned & spoke with [facility] @ [facility] and she is going to pull patient's chart for 2015 labs and call me back.

## 2014-01-02 ENCOUNTER — Encounter (HOSPITAL_COMMUNITY): Payer: Medicare Other

## 2014-01-10 ENCOUNTER — Telehealth: Payer: Self-pay

## 2014-01-10 NOTE — Telephone Encounter (Signed)
Patient's therapist wanted to report to Dr Everlene Farrier that the patient mis-stepped with her prothestic leg and actually touched down, but patient has no injuries.  I spoke with Dr Everlene Farrier and made him aware.

## 2014-01-13 ENCOUNTER — Telehealth: Payer: Self-pay

## 2014-01-13 NOTE — Telephone Encounter (Signed)
Pt is requesting a pre-authorization for sybicort.

## 2014-01-20 ENCOUNTER — Telehealth: Payer: Self-pay

## 2014-01-20 MED ORDER — ROSUVASTATIN CALCIUM 20 MG PO TABS: 20.0000 mg | ORAL_TABLET | Freq: Every day | ORAL | Status: DC

## 2014-01-20 NOTE — Telephone Encounter (Signed)
Pt's daughter came in and dropped off pre-authorization forms for Symbicort  for Dr Everlene Farrier. I made a copy for daughter and placed forms in nurses in box @ TL station. Please call daughter @ [phone] when ready Thank you

## 2014-01-20 NOTE — Telephone Encounter (Signed)
RF sent in and pt notified.

## 2014-01-20 NOTE — Telephone Encounter (Signed)
[facility]ill of [facility]or sent [facility]. Patient has 6 pills left

## 2014-01-23 ENCOUNTER — Telehealth: Payer: Self-pay

## 2014-01-23 NOTE — Telephone Encounter (Signed)
Forms will be in Dr. Caren Griffins box for signature today by noon.

## 2014-01-23 NOTE — Telephone Encounter (Signed)
[facility](Suzanne [name]) states pt is recertified for balance/ambulation etc therapy for 4 wks She wanted to make Dr Everlene Farrier aware   Best phone for Suzanne [name](phy therapist) is [phone]

## 2014-01-23 NOTE — Telephone Encounter (Signed)
PA Forms completed, signed by Dr Everlene Farrier and faxed. Pending.

## 2014-01-23 NOTE — Telephone Encounter (Signed)
Dr Everlene Farrier, this looks like it is just FYI.

## 2014-01-24 ENCOUNTER — Encounter: Payer: Self-pay | Admitting: Vascular Surgery

## 2014-01-24 NOTE — Telephone Encounter (Signed)
Pt called to check status and I advised that we have sent in PA form and are waiting for decision. She stated that she thinks she needs a Rx sent in to [facility] for it if it is covered. We will do that if approved. [facility] sent another fax asking for additional info which I sent to them.

## 2014-01-25 ENCOUNTER — Ambulatory Visit: Payer: Medicare Other | Admitting: Vascular Surgery

## 2014-01-25 ENCOUNTER — Encounter: Payer: Self-pay | Admitting: Vascular Surgery

## 2014-01-25 ENCOUNTER — Encounter (HOSPITAL_COMMUNITY): Payer: Medicare Other

## 2014-01-25 ENCOUNTER — Ambulatory Visit (HOSPITAL_COMMUNITY)
Admission: RE | Admit: 2014-01-25 | Discharge: 2014-01-25 | Disposition: A | Discharge status: Home / Self Care | Payer: Medicare Other | Source: Ambulatory Visit | Attending: Vascular Surgery | Admitting: Vascular Surgery

## 2014-01-25 ENCOUNTER — Ambulatory Visit (INDEPENDENT_AMBULATORY_CARE_PROVIDER_SITE_OTHER): Payer: Medicare Other | Admitting: Vascular Surgery

## 2014-01-25 VITALS — BP 138/60 | HR 77 | Ht 62.0 in | Wt 129.0 lb

## 2014-01-25 DIAGNOSIS — Z48812 Encounter for surgical aftercare following surgery on the circulatory system: Secondary | ICD-10-CM | POA: Diagnosis not present

## 2014-01-25 DIAGNOSIS — I739 Peripheral vascular disease, unspecified: Secondary | ICD-10-CM | POA: Diagnosis not present

## 2014-01-25 DIAGNOSIS — I2510 Atherosclerotic heart disease of native coronary artery without angina pectoris: Secondary | ICD-10-CM

## 2014-01-25 NOTE — Progress Notes (Signed)
POST OPERATIVE OFFICE NOTE      HPI:  This is a 80 y.o. female who is here for surgery follow up s/p right AKA 09/27/13 by Dr. Scot Dock and 6 month follow up ABI left LE.  She is doing well in therapy and is in the process of being fitted with a right prothesis.     Allergies  Allergen Reactions  . Codeine Other (See Comments)    REACTION: hallucinations  . Hydrochlorothiazide Other (See Comments)    REACTION: hypotension  . Pregabalin Other (See Comments)    REACTION: unknown  . Other Rash    Adhesives and bandages gives pt a rash.    Current Outpatient Prescriptions  Medication Sig Dispense Refill  . acetaminophen (TYLENOL) 500 MG tablet Take 1,000 mg by mouth every 4 (four) hours as needed for moderate pain.     [name] albuterol (PROVENTIL) (2.5 MG/3ML) 0.083% nebulizer solution Take 2.5 mg by nebulization 3 (three) times daily as needed for wheezing or shortness of breath.     . Alcohol Swabs (ALCOHOL PREP) 70 % PADS To use before injecting lantus 100 each 4  . aspirin EC 325 MG EC tablet Take 1 tablet (325 mg total) by mouth daily.    . Calcium Carbonate-Vitamin D (CALCIUM 600+D) 600-400 MG-UNIT per tablet Use bid 180 tablet 4  . esomeprazole (NEXIUM) 40 MG capsule Take 1 capsule (40 mg total) by mouth daily before breakfast. 90 capsule 4  . ferrous sulfate 325 (65 FE) MG tablet Take 1 tablet (325 mg total) by mouth daily with breakfast. 90 tablet 4  . Fluticasone-Salmeterol (ADVAIR) 100-50 MCG/DOSE AEPB Inhale 1 puff into the lungs 2 (two) times daily. 3 each 4  . furosemide (LASIX) 40 MG tablet Take 1 tablet (40 mg total) by mouth daily. 90 tablet 4  . Insulin Pen Needle (PEN NEEDLES 3/16") 31G X 5 MM MISC To use once daily with LANTUS pens 100 each 4  . Ipratropium-Albuterol (COMBIVENT RESPIMAT) 20-100 MCG/ACT AERS respimat Inhale 2 puffs into the lungs every 6 (six) hours as needed for wheezing or shortness of breath. 3 Inhaler 4  . LANTUS SOLOSTAR 100 UNIT/ML Solostar Pen INJECT  12 UNITS UNDER THE SKIN DAILY 1 pen 0  . lidocaine (LIDODERM) 5 % Place 1 patch onto the skin daily. Remove & Discard patch within 12 hours or as directed by MD    . Omega-3 Fatty Acids (FISH OIL) 1200 MG CAPS Take 1 capsule (1,200 mg total) by mouth 2 (two) times daily. 90 capsule 4  . ranitidine (ZANTAC) 150 MG tablet Take 1 tablet (150 mg total) by mouth 2 (two) times daily. 180 tablet 4  . rosuvastatin (CRESTOR) 20 MG tablet Take 1 tablet (20 mg total) by mouth daily. PATIENT NEEDS OFFICE VISIT FOR ADDITIONAL REFILLS 90 tablet 0  . HYDROcodone-acetaminophen (NORCO) 7.5-325 MG per tablet Take 1 tablet by mouth every 4 (four) hours as needed. Take one half tablet as needed for pain every 6 hours    . HYDROcodone-acetaminophen (NORCO) 7.5-325 MG per tablet Take 1 tablet by mouth every 4 (four) hours as needed for moderate pain. (Patient not taking: Reported on 01/25/2014) 30 tablet 0  . ibandronate (BONIVA) 150 MG tablet Take 1 tablet (150 mg total) by mouth every 30 (thirty) days. Take in the morning with a full glass of water, on an empty stomach, and do not take anything else by mouth or lie down for the next 30 min. (take on the 25th  of each month) (Patient not taking: Reported on 01/25/2014) 4 tablet 4  . losartan (COZAAR) 100 MG tablet Take 100 mg by mouth daily.    . metoprolol succinate (TOPROL-XL) 25 MG 24 hr tablet Take 25 mg by mouth daily.      No current facility-administered medications for this visit.     Review of Systems  Constitutional: Negative for fever and chills.  HENT: Positive for hearing loss. Negative for congestion.   Eyes: Negative for blurred vision and double vision.  Respiratory: Negative for cough and stridor.   Cardiovascular: Negative for chest pain and palpitations.  Gastrointestinal: Negative for nausea and vomiting.  Genitourinary: Negative for dysuria.  Musculoskeletal: Positive for falls.  Skin: Negative for rash.  Neurological: Negative for tingling,  tremors and headaches.  Endo/Heme/Allergies: Does not bruise/bleed easily.  Psychiatric/Behavioral: Negative for depression and memory loss.     Physical Exam:  Filed Vitals:   01/25/14 1510  BP: 138/60  Pulse: 77    Right AKA stump has healed well without tenderness to touch at this time. Left foot without ischemic changes no ulcers or edema.  Left hallux ingrown toe nail with signs of infection. Lungs CTA no wheezing Heart RRR no mumurs Abdomin soft NTTP  ABI Left Biphasic flow 2.2  Assessment/Plan:  This is a 80 y.o. female who is s/p: Right AKA well healed. She is in therapy and being fitted for a prothesis.   Left No ischemic changes with biphasic flow per ABI's.    She will F/U in 6 months for ABI on the left and check of mobility on the right.  If she has concerns or troubles she will call sooner.     Theda Sers Yuri Flener [facility] PA-C [facility] [phone]  Clinic MD:  Pt seen and examined with Dr. Scot Dock  Agree with above. Her right AKA has healed nicely and she is beginning to work towards getting a prosthesis. She has no wounds on her left foot. I plan on seeing her back in 6 months. Hopefully she will be ambulating with her prosthesis at that time. We will obviously keep a close eye on her left foot. On today's study she had biphasic Doppler signals in the left foot. I'll see her in 6 months. She knows to call sooner if she has problems.  [name], MD, [place] [phone] 01/25/2014

## 2014-01-25 NOTE — Addendum Note (Signed)
Addended by: Mena Goes on: 01/25/2014 04:39 PM   Modules accepted: Orders

## 2014-01-26 ENCOUNTER — Encounter (HOSPITAL_COMMUNITY): Payer: Self-pay | Admitting: Cardiovascular Disease

## 2014-01-31 MED ORDER — BUDESONIDE-FORMOTEROL FUMARATE 160-4.5 MCG/ACT IN AERO: 2.0000 | INHALATION_SPRAY | Freq: Two times a day (BID) | RESPIRATORY_TRACT | Status: DC

## 2014-01-31 NOTE — Telephone Encounter (Signed)
For info purposes for future PAs, pt can not use Advair bc it causes her dry cough and labored breathing/SOB. She has been well-controlled (COPD) for several years w/Symbicort.

## 2014-01-31 NOTE — Telephone Encounter (Signed)
PA approved for Symbicort and pt/daughter notified. Per daughters' req sending in 3 mos supply to [facility].

## 2014-02-14 ENCOUNTER — Encounter: Payer: Self-pay | Admitting: Emergency Medicine

## 2014-02-14 ENCOUNTER — Ambulatory Visit (INDEPENDENT_AMBULATORY_CARE_PROVIDER_SITE_OTHER): Payer: Medicare Other | Admitting: Emergency Medicine

## 2014-02-14 VITALS — BP 147/76 | HR 78 | Temp 97.5°F | Resp 16

## 2014-02-14 DIAGNOSIS — I739 Peripheral vascular disease, unspecified: Secondary | ICD-10-CM

## 2014-02-14 DIAGNOSIS — M79605 Pain in left leg: Secondary | ICD-10-CM

## 2014-02-14 DIAGNOSIS — I10 Essential (primary) hypertension: Secondary | ICD-10-CM

## 2014-02-14 DIAGNOSIS — M79602 Pain in left arm: Secondary | ICD-10-CM

## 2014-02-14 DIAGNOSIS — E118 Type 2 diabetes mellitus with unspecified complications: Secondary | ICD-10-CM

## 2014-02-14 DIAGNOSIS — I2510 Atherosclerotic heart disease of native coronary artery without angina pectoris: Secondary | ICD-10-CM

## 2014-02-14 DIAGNOSIS — J449 Chronic obstructive pulmonary disease, unspecified: Secondary | ICD-10-CM

## 2014-02-14 LAB — GLUCOSE, POCT (MANUAL RESULT ENTRY): POC GLUCOSE: 93 mg/dL (ref 70–99)

## 2014-02-14 NOTE — Progress Notes (Signed)
Subjective:    Patient ID: Suzanne [name], female    DOB: 24-Apr-1921, 80 y.o.   MRN: [id number]  HPI Patient presents for follow up with daughter, her POA, for DM, CKD, HTN, and COPD with new problem of left arm pain.   Diabetes under control with Lantus insulin and is taking 12 units without any missed doses. Has not taken glucose at home in two weeks due to malfunctioning meters. Numbers are usually btw 95-105.  Does not have follow up for CKD with Dr. Florene Glen, nephrologist as he does not need to see him unless sent by Dr. Everlene Farrier or Dr. Gwenlyn Found.   Severe peripheral arterial disease/CAD which is followed by the vascular surgeons and Dr. Gwenlyn Found. Next appt not scheduled as of now. Has temporary prosthesis. Next appt 02/20/13 and may get permanent prosthesis at that time.  Was taken off BP meds for physical therapy. Home BP systolic numbers in 509T. BP elevated in office today. Daughter believes it is related to having difficulty breathing last night.   Had difficulty breathing last night. COPD was well controlled with Symbicort up until insurance made them do trial of Advair which did not work. Has now been on Symbicort for 1 week. To aid with breathing took nebulized albuterol and Symbicort. Has not taken combivent. Is breathing much better this morning. Feels like breathing difficulty with the weather change.   Pain in left arm and hand for 2 weeks. Does not radiate to neck or back. Fingers sometimes tingle, but does not go numb. Daughter believes it is related to using walker in physical therapy, but no other trauma to the area. Is left hand dominate.   Review of Systems  Constitutional: Negative for fever, activity change and appetite change.  Respiratory: Negative for cough, chest tightness, shortness of breath and wheezing.   Cardiovascular: Negative for chest pain and palpitations.  Gastrointestinal: Negative for nausea, vomiting and abdominal pain.  Musculoskeletal: Positive for myalgias  (left arm) and gait problem. Negative for back pain, joint swelling, arthralgias and neck pain.  Skin: Positive for wound (left great toe). Negative for rash.  Neurological: Negative for weakness, numbness and headaches.      Objective:   Physical Exam  Constitutional: She is oriented to person, place, and time. She appears well-developed and well-nourished. No distress.  Blood pressure 147/76, pulse 78, temperature 97.5 F (36.4 C), resp. rate 16, weight 0 lb (0 kg), SpO2 93 %.  HENT:  Head: Normocephalic and atraumatic.  Right Ear: External ear normal.  Left Ear: External ear normal.  Mouth/Throat: Oropharynx is clear and moist.  Eyes: Conjunctivae are normal. Pupils are equal, round, and reactive to light. Right eye exhibits no discharge. Left eye exhibits no discharge.  Neck: Normal range of motion. Neck supple. No thyromegaly present.  Cardiovascular: Normal rate, regular rhythm, normal heart sounds and intact distal pulses.  Exam reveals no gallop and no friction rub.   No murmur heard. Pulmonary/Chest: Effort normal and breath sounds normal. No respiratory distress. She has no wheezes. She has no rales.  Musculoskeletal: Normal range of motion. She exhibits no edema or tenderness (with palpation to great toe).  Lymphadenopathy:    She has no cervical adenopathy.  Neurological: She is alert and oriented to person, place, and time.  Skin: She is not diaphoretic.   Results for orders placed or performed in visit on 02/14/14  POCT glucose (manual entry)  Result Value Ref Range   POC Glucose 93 70 - 99  mg/dl       Assessment & Plan:  1. Type 2 diabetes mellitus with complication Well controlled with Lantus. Decrease Lantus from 12 to 10 units. Working on getting a new meter. - POCT glucose (manual entry)  2. PAD (peripheral artery disease) No follow up scheduled at this time. Circulation on left is good and right amputation stump has healed well.  3. Essential  hypertension Well controlled without medication.  4. Chronic obstructive pulmonary disease, unspecified COPD, unspecified chronic bronchitis type Continue albuterol, symbicort, and combivent as prescribed.  5. Arm pain, diffuse, left May use lidoderm patches that already have at home and Tylenol prn. Be mindful of positioning at physical therapy.   Alveta Heimlich PA-C  [facility] 02/14/2014 10:18 AM

## 2014-02-14 NOTE — Patient Instructions (Signed)
1. Lidoderm patches or Tylenol for arm pain. 2. Cut back Lantus 10 units.

## 2014-03-12 ENCOUNTER — Other Ambulatory Visit: Payer: Self-pay | Admitting: Physician Assistant

## 2014-03-15 ENCOUNTER — Ambulatory Visit: Payer: Medicare Other | Attending: Vascular Surgery | Admitting: Physical Therapy

## 2014-03-15 ENCOUNTER — Encounter: Payer: Self-pay | Admitting: Physical Therapy

## 2014-03-15 DIAGNOSIS — R29818 Other symptoms and signs involving the nervous system: Secondary | ICD-10-CM | POA: Diagnosis not present

## 2014-03-15 DIAGNOSIS — R6889 Other general symptoms and signs: Secondary | ICD-10-CM | POA: Diagnosis not present

## 2014-03-15 DIAGNOSIS — R2689 Other abnormalities of gait and mobility: Secondary | ICD-10-CM

## 2014-03-15 DIAGNOSIS — R531 Weakness: Secondary | ICD-10-CM | POA: Diagnosis not present

## 2014-03-15 DIAGNOSIS — R269 Unspecified abnormalities of gait and mobility: Secondary | ICD-10-CM | POA: Diagnosis not present

## 2014-03-16 NOTE — Therapy (Signed)
[facility] [address] [place], [place], [address] Phone: [phone]   Fax:  [phone]  Physical Therapy Evaluation  Patient Details  Name: Suzanne [name] MRN: [id number] Date of Birth: 11-Oct-1921 Referring Provider:  Darlyne Russian, MD  Encounter Date: 03/15/2014      PT End of Session - 03/16/14 1213    Visit Number 1   Number of Visits 17   Date for PT Re-Evaluation 05/12/14   PT Start Time 5638   PT Stop Time 1100   PT Time Calculation (min) 45 min   Equipment Utilized During Treatment Gait belt   Activity Tolerance Patient tolerated treatment well   Behavior During Therapy [facility] for tasks assessed/performed      Past Medical History  Diagnosis Date  . PVD (peripheral vascular disease)     a. PTA & stent right superficial artery PTA & left common iliac artery stenting. Right fem-pop bypass graft. b. Per Dr. Kennon Holter note: h/o aortobifemoral bypass grafting remotely as well as fem-peroneal bypass grafting.  . Mixed hyperlipidemia   . Compression fx, thoracic spine     T12  . Ischium fracture   . Memory loss, short term   . Pulmonary nodule   . Chronic diastolic CHF (congestive heart failure) 09-2008    a. EF 40% by cath 2010. b. Improved - last echo 2015 with normal EF.  [name] Anemia   . Detached retina 02-2005    R EYE  . Fracture 02-2007    L5 AND PELVIS  . Left ventricular dysfunction     a. EF 40% by cath 2010. b. Improved - last echo 2015 with normal EF.   [name] AAA (abdominal aortic aneurysm)     Repair unknown date  . Critical lower limb ischemia   . Chronic respiratory failure     a. On home O2.  [name] Hypertension   . Warfarin anticoagulation     DC summary from 2010 reports she is "on chronic  Coumadin for vascular disease." This is listed on her med list going back to 2006 by records available. Teaching service H&P remotely says she has h/o DVT but I cannot find any evidence of this. Admission H/P  03/2013 suggests history of atrial fib but all ECGs appear sinus and there is no mention of this previously.  [name] COPD (chronic obstructive pulmonary disease)   . Type II or unspecified type diabetes mellitus without mention of complication, not stated as uncontrolled   . Complication of anesthesia     hard to wake up, bp problems   . Pneumonia     hx  . GERD (gastroesophageal reflux disease)   . Arthritis   . CAD (coronary artery disease) 10/12/2008    a. 30% LAD,30% CX & 40% tandem mid stenosis in the AV groove, 50-60% RCA  . Renal artery stenosis     a. 1-59% by doppler in 2014.; CKD stage III (Dr. Erling Cruz)  . On home oxygen therapy     "2.5L O2 at sleep or w/increased activity" (09/27/2013)    Past Surgical History  Procedure Laterality Date  . Abdominal aortic aneurysm repair  98    "lower left ventricle"  . Aorto-femoral bypass graft Bilateral     S/P aortobifemoral bypass grafting and right fem-peroneal bypass grafting remotely  . Cholecystectomy    . Cataract extraction Right 06-2006  . Vascular surgery    . Cardiac catheterization    . Retinal detachment surgery Right     "  put buckle in; it popped & they had to remove it"  . Eye surgery    . Patch angioplasty Left 04/19/2013    Procedure: VEIN PATCH ANGIOPLASTY - LEFT COMMON FEMORAL ARTERY;  Surgeon: Angelia Mould, MD;  Location: [facility];  Service: Vascular;  Laterality: Left;  . Femoral-popliteal bypass graft Left 04/19/2013    Procedure: BYPASS GRAFT FEMORAL- BELOW KNEE POPLITEAL ARTERY WITH VEIN;  Surgeon: Angelia Mould, MD;  Location: [facility];  Service: Vascular;  Laterality: Left;  . Fracture surgery      sternum  2008 no surgery  . Above knee leg amputation Right 09/27/2013  . Femoral-peroneal bypass graft    . Aorto-femoral bypass graft    . Patella fracture surgery Right ~ 1987    "shattered; work related injury"  . Total abdominal hysterectomy    . Amputation Right 09/27/2013    Procedure: AMPUTATION  ABOVE KNEE-RIGHT;  Surgeon: Angelia Mould, MD;  Location: [facility];  Service: Vascular;  Laterality: Right;  . Lower extremity angiogram Left 04/04/2013    Procedure: LOWER EXTREMITY ANGIOGRAM;  Surgeon: Lorretta Harp, MD;  Location: [facility] CATH LAB;  Service: Cardiovascular;  Laterality: Left;    There were no vitals taken for this visit.  Visit Diagnosis:  Abnormality of gait  Activity intolerance  Weakness generalized  Balance problem      Subjective Assessment - 03/16/14 1215    Symptoms this 81yo female underwent 09/27/13 a Right Transfemoral Amputation due to occlusion in 81yo bypass graft. She recieved her first prosthesis 12/27/13. She had HHPT and discharged with supervision with RW & prosthesis.           [facility] PT Assessment - 03/15/14 1015    Assessment   Medical Diagnosis TFA   Onset Date 12/27/13   Precautions   Precautions Fall   Restrictions   Weight Bearing Restrictions No   Balance Screen   Has the patient fallen in the past 6 months Yes   How many times? 1  ambulating with PT    Has the patient had a decrease in activity level because of a fear of falling?  Yes  not safe with prosthesis alone   Is the patient reluctant to leave their home because of a fear of falling?  Yes   [place] Private residence   Living Arrangements Alone  adult dtr comes daily & stays ~2 nights   Type of [place] Access Level entry  handicap ramp out of parking lot   [place] One level   [place] - 2 wheels;Cane - single point;Bedside commode;Tub bench;Grab bars - tub/shower;Grab bars - toilet;Wheelchair - manual   Prior Function   Level of Independence Independent with basic ADLs;Independent with homemaking with ambulation;Independent with gait;Independent with transfers   Vocation Retired   [employer]   Overall Cognitive Status Within Functional Limits for tasks assessed   Observation/Other Assessments   Focus  on Therapeutic Outcomes (FOTO)  30  Functional Status    Fear Avoidance Belief Questionnaire (FABQ)  43   Posture/Postural Control   Posture/Postural Control Postural limitations   Postural Limitations Rounded Shoulders;Forward head;Increased thoracic kyphosis;Flexed trunk;Weight shift left   Strength   Overall Strength Within functional limits for tasks performed   Overall Strength Comments UE & LLE grossly 5/5 for 81yo tested in sitting  Right hip grossly 3/5   Transfers   Transfers Sit to Stand;Stand to Sit   Sit  to Stand 5: Supervision;With upper extremity assist;With armrests;From chair/3-in-1  to RW for stabilization   Sit to Stand Details (indicate cue type and reason) manual cues to control prosthetic knee   Stand to Sit 5: Supervision;With upper extremity assist;With armrests;To chair/3-in-1  from RW    Stand to Sit Details verbal cues on safety   Ambulation/Gait   Ambulation/Gait Yes   Ambulation/Gait Assistance 4: Min guard   Ambulation Distance (Feet) 70 Feet   Assistive device Rolling walker;Prosthesis   Gait Pattern Step-to pattern;Decreased step length - left;Decreased stance time - right;Decreased stride length;Decreased hip/knee flexion - right;Decreased weight shift to right;Right hip hike;Lateral hip instability;Trunk flexed;Abducted- right;Poor foot clearance - right   Ambulation Surface Level;Indoor   Gait velocity 0.51 ft/sec   Gait Comments SpO2 93%, HR 104, Dyspnea 3/4 after gait  104 is 80% of max HR of 130 for her age.   Static Standing Balance   Static Standing - Balance Support No upper extremity supported   Static Standing - Level of Assistance 4: Min assist   Static Standing - Comment/# of Minutes 15 seconds   Dynamic Standing Balance   Dynamic Standing - Balance Support Left upper extremity supported;Bilateral upper extremity supported;During functional activity  RW support, reaching with dominant RUE, trunk with BUE   Dynamic Standing - Level of  Assistance 5: Stand by assistance   Dynamic Standing - Balance Activities Reaching for objects;Head turns;Head nods   Dynamic Standing - Comments reaches 3" anteriorly & to knee level towards floor         Prosthetics Assessment - 03/15/14 1015    Prosthetic Care Dependent with Skin check;Residual limb care;Prosthetic cleaning;Correct ply sock adjustment;Proper wear schedule/adjustment  donning prosthesis   Prosthetic Care Comments  Donned with air pocket distally   Donning prosthesis  Supervision   Doffing prosthesis  Modified independent (Device/Increase time)   Current prosthetic wear tolerance (days/week)  1-2 days/wk   Current prosthetic wear tolerance (#hours/day)  ~8 of 15 awake hours on 1-2 days that she wears  Awakens ~8am, donnes ~10am, doffes ~6pm, bed ~11pm   Current prosthetic weight-bearing tolerance (hours/day)  tolerated 3 minutes of standing with RW without pain   Residual limb condition  no open areas but fragile skin   K code/activity level with prosthetic use  2                           PT Short Term Goals - 03/15/14 1100    PT SHORT TERM GOAL #1   Title wears prostheis >/= 4 days /wk for >/= 50% of awake hours. (Target Date: 04/14/14)   Time 4   Period Weeks   Status New   PT SHORT TERM GOAL #2   Title reaches 5" anteriorly & manages pants for toileting in standing with rolling walker safely with no balance loss.  (Target Date: 04/14/14)   Time 4   Period Weeks   Status New   PT SHORT TERM GOAL #3   Title ambulates 100' with rolling walker & prosthesis with supervision.  (Target Date: 04/14/14)   Time 4   Period Weeks   Status New           PT Long Term Goals - 03/16/14 1256    PT LONG TERM GOAL #1   Title Patient wears prosthesis daily >66% of awake hours without pain or skin issues.  (Target Date: 05/12/14)   Time 8   Period Weeks  Status New   PT LONG TERM GOAL #2   Title verbalizes proper prosthetic care  (Target Date:  05/12/14)   Time 8   Period Weeks   Status New   PT LONG TERM GOAL #3   Title ambulates 100' with rolling walker & prosthesis modified independent.  (Target Date: 05/12/14)   Time 8   Period Weeks   Status New   PT LONG TERM GOAL #4   Title negotiates ramp & curb with rolling walker & prosthesis with daughter minimal assist safely  (Target Date: 05/12/14)   Time 8   Period Weeks   Status New   PT LONG TERM GOAL #5   Title reaches into upper / lower cabinets & manages clothes in standing with walker /counter support modified independent.  (Target Date: 05/12/14)   Time 8   Period Weeks   Status New   Additional Long Term Goals   Additional Long Term Goals Yes   PT LONG TERM GOAL #6   Title Functional Status self-report on FOTO improves by 10 points.  (Target Date: 05/12/14)   Time 8   Period Weeks   Status New               Plan - 04-01-14 1215    Clinical Impression Statement This 81yo female underwent a right Transfemoral Amputation on 09/27/13 due to occulsion of femoral-popliteal graft. She recieved her first prosthesis on 12/27/13 and had HHPT to work on prosthetic gait initially. When she was seen for follow-up, she was referred to Prosthetic specialist PT to facilitate independent level gait. She is limiting wear of prosthesis to 1-2 days /wk which limits her functional mobility. Her balance and gait require assist for safety.   Pt will benefit from skilled therapeutic intervention in order to improve on the following deficits Abnormal gait;Decreased activity tolerance;Decreased balance;Decreased endurance;Decreased knowledge of use of DME;Decreased mobility;Decreased strength   Rehab Potential Good   PT Frequency 2x / week   PT Duration 8 weeks   PT Treatment/Interventions ADLs/Self Care Home Management;DME Instruction;Gait training;Stair training;Functional mobility training;Therapeutic activities;Therapeutic exercise;Balance training;Neuromuscular  re-education;Patient/family education;Other (comment)  prosthetic training   PT Next Visit Plan Prosthetic care instruction including daily wear, HEP   PT Home Exercise Plan balance & midline at sink   Consulted and Agree with Plan of Care Patient;Family member/caregiver   Family Member Consulted daughter          G-Codes - Apr 01, 2014 1305    Functional Assessment Tool Used Patient ambulates 67' with rolling walker & prostheis with minimal gaurd at 0.51 ft/sec.    Functional Limitation Mobility: Walking and moving around   Mobility: Walking and Moving Around Current Status [phone]) At least 60 percent but less than 80 percent impaired, limited or restricted   Mobility: Walking and Moving Around Goal Status [phone]) At least 40 percent but less than 60 percent impaired, limited or restricted       Problem List Patient Active Problem List   Diagnosis Date Noted  . Essential hypertension, benign 11/16/2013  . Peripheral vascular disease, unspecified 07/20/2013  . Discoloration of skin-Right leg 07/15/2013  . Cold feeling-Right Leg 07/15/2013  . Pain in limb-Right Leg 07/15/2013  . Atherosclerotic PVD with ulceration 04/13/2013  . Critical lower limb ischemia 04/04/2013  . Claudication 04/03/2013  . UTI (lower urinary tract infection) 01/25/2013  . Urosepsis 01/25/2013  . Long term (current) use of anticoagulants 05/12/2012  . PAD (peripheral artery disease) 03/03/2012  . Liver cyst 02/26/2012  .  Cyst, kidney, acquired 02/26/2012  . Kidney stone 02/26/2012  . Chronic kidney disease, stage 3 11/19/2011  . COPD (chronic obstructive pulmonary disease) 03/18/2011  . Osteoporosis 12/17/2010  . Type II diabetes mellitus, uncontrolled   . PVD (peripheral vascular disease)   . Mixed hyperlipidemia   . Renal artery stenosis   . Memory loss, short term   . Pulmonary nodule   . CAD (coronary artery disease)   . CHF (congestive heart failure)   . Anemia     Clytee Heinrich PT, DPT PT  Specializing in Prosthetics 03/16/2014, 1:08 PM  [facility] [address] [place], [place], [address] Phone: [phone]   Fax:  [phone]

## 2014-03-22 ENCOUNTER — Ambulatory Visit: Payer: Medicare Other | Attending: Vascular Surgery | Admitting: Physical Therapy

## 2014-03-22 DIAGNOSIS — R6889 Other general symptoms and signs: Secondary | ICD-10-CM | POA: Insufficient documentation

## 2014-03-22 DIAGNOSIS — R531 Weakness: Secondary | ICD-10-CM | POA: Insufficient documentation

## 2014-03-22 DIAGNOSIS — R269 Unspecified abnormalities of gait and mobility: Secondary | ICD-10-CM | POA: Diagnosis present

## 2014-03-22 DIAGNOSIS — R29818 Other symptoms and signs involving the nervous system: Secondary | ICD-10-CM | POA: Diagnosis not present

## 2014-03-22 DIAGNOSIS — R2689 Other abnormalities of gait and mobility: Secondary | ICD-10-CM

## 2014-03-23 ENCOUNTER — Encounter: Payer: Self-pay | Admitting: Physical Therapy

## 2014-03-23 NOTE — Therapy (Signed)
[facility] [address] [place], [place], [address] Phone: [phone]   Fax:  [phone]  Physical Therapy Treatment  Patient Details  Name: Suzanne [name] MRN: [id number] Date of Birth: [birth year] Referring Provider:  Darlyne Russian, MD  Encounter Date: 03/22/2014      PT End of Session - 03/22/14 1530    Visit Number 2   Number of Visits 17   Date for PT Re-Evaluation 05/12/14   PT Start Time 6789   PT Stop Time 1615   PT Time Calculation (min) 45 min   Equipment Utilized During Treatment Gait belt   Activity Tolerance Patient tolerated treatment well   Behavior During Therapy [facility] for tasks assessed/performed      Past Medical History  Diagnosis Date  . PVD (peripheral vascular disease)     a. PTA & stent right superficial artery PTA & left common iliac artery stenting. Right fem-pop bypass graft. b. Per Dr. Kennon Holter note: h/o aortobifemoral bypass grafting remotely as well as fem-peroneal bypass grafting.  . Mixed hyperlipidemia   . Compression fx, thoracic spine     T12  . Ischium fracture   . Memory loss, short term   . Pulmonary nodule   . Chronic diastolic CHF (congestive heart failure) 09-2008    a. EF 40% by cath 2010. b. Improved - last echo 2015 with normal EF.  [name] Anemia   . Detached retina 02-2005    R EYE  . Fracture 02-2007    L5 AND PELVIS  . Left ventricular dysfunction     a. EF 40% by cath 2010. b. Improved - last echo 2015 with normal EF.   [name] AAA (abdominal aortic aneurysm)     Repair unknown date  . Critical lower limb ischemia   . Chronic respiratory failure     a. On home O2.  [name] Hypertension   . Warfarin anticoagulation     DC summary from 2010 reports she is "on chronic  Coumadin for vascular disease." This is listed on her med list going back to 2006 by records available. Teaching service H&P remotely says she has h/o DVT but I cannot find any evidence of this. Admission H/P  03/2013 suggests history of atrial fib but all ECGs appear sinus and there is no mention of this previously.  [name] COPD (chronic obstructive pulmonary disease)   . Type II or unspecified type diabetes mellitus without mention of complication, not stated as uncontrolled   . Complication of anesthesia     hard to wake up, bp problems   . Pneumonia     hx  . GERD (gastroesophageal reflux disease)   . Arthritis   . CAD (coronary artery disease) 10/12/2008    a. 30% LAD,30% CX & 40% tandem mid stenosis in the AV groove, 50-60% RCA  . Renal artery stenosis     a. 1-59% by doppler in 2014.; CKD stage III (Dr. Erling Cruz)  . On home oxygen therapy     "2.5L O2 at sleep or w/increased activity" (09/27/2013)    Past Surgical History  Procedure Laterality Date  . Abdominal aortic aneurysm repair  98    "lower left ventricle"  . Aorto-femoral bypass graft Bilateral     S/P aortobifemoral bypass grafting and right fem-peroneal bypass grafting remotely  . Cholecystectomy    . Cataract extraction Right 06-2006  . Vascular surgery    . Cardiac catheterization    . Retinal detachment surgery Right     "  put buckle in; it popped & they had to remove it"  . Eye surgery    . Patch angioplasty Left 04/19/2013    Procedure: VEIN PATCH ANGIOPLASTY - LEFT COMMON FEMORAL ARTERY;  Surgeon: Angelia Mould, MD;  Location: [facility];  Service: Vascular;  Laterality: Left;  . Femoral-popliteal bypass graft Left 04/19/2013    Procedure: BYPASS GRAFT FEMORAL- BELOW KNEE POPLITEAL ARTERY WITH VEIN;  Surgeon: Angelia Mould, MD;  Location: [facility];  Service: Vascular;  Laterality: Left;  . Fracture surgery      sternum  2008 no surgery  . Above knee leg amputation Right 09/27/2013  . Femoral-peroneal bypass graft    . Aorto-femoral bypass graft    . Patella fracture surgery Right ~ 1987    "shattered; work related injury"  . Total abdominal hysterectomy    . Amputation Right 09/27/2013    Procedure: AMPUTATION  ABOVE KNEE-RIGHT;  Surgeon: Angelia Mould, MD;  Location: [facility];  Service: Vascular;  Laterality: Right;  . Lower extremity angiogram Left 04/04/2013    Procedure: LOWER EXTREMITY ANGIOGRAM;  Surgeon: Lorretta Harp, MD;  Location: [facility] CATH LAB;  Service: Cardiovascular;  Laterality: Left;    There were no vitals taken for this visit.  Visit Diagnosis:  Abnormality of gait  Activity intolerance  Weakness generalized  Balance problem      Subjective Assessment - 03/23/14 1317    Symptoms The prosthesis pushes into her pubic bone and hurts.   Currently in Pain? No/denies                    [facility] Adult PT Treatment/Exercise - 03/22/14 1530    Transfers   Sit to Stand 4: Min assist;With upper extremity assist;With armrests;From chair/3-in-1   Sit to Stand Details (indicate cue type and reason) demo /instructed proper technique including prosthesis position and wt shift  10 reps   Stand to Sit 4: Min guard;With upper extremity assist;With armrests;To chair/3-in-1   Stand to Sit Details demo /instructed proper technique including prosthesis position and wt shift  10 reps   Prosthetics   Prosthetic Care Comments  PT demo /cued on using towel /pillow under left pelvis/thigh to level pelvis, donning with proper rotation   Education Provided Proper wear schedule/adjustment  proper donning   Person(s) Educated Patient;Child(ren)   Education Method Explanation;Demonstration;Verbal cues   Education Method Verbalized understanding;Returned demonstration;Needs further instruction;Verbal cues required;Tactile cues required                PT Education - 03/22/14 1530    Education provided Yes   Education Details Sit to/from stand, siting with prosthesis, called prosthetist re: alignment   Person(s) Educated Patient;Child(ren)   Methods Explanation;Demonstration   Comprehension Verbalized understanding;Returned demonstration;Need further instruction;Verbal cues  required;Tactile cues required          PT Short Term Goals - 03/15/14 1100    PT SHORT TERM GOAL #1   Title wears prostheis >/= 4 days /wk for >/= 50% of awake hours. (Target Date: 04/14/14)   Time 4   Period Weeks   Status New   PT SHORT TERM GOAL #2   Title reaches 5" anteriorly & manages pants for toileting in standing with rolling walker safely with no balance loss.  (Target Date: 04/14/14)   Time 4   Period Weeks   Status New   PT SHORT TERM GOAL #3   Title ambulates 100' with rolling walker & prosthesis with supervision.  (  Target Date: 04/14/14)   Time 4   Period Weeks   Status New           PT Long Term Goals - 03/16/14 1256    PT LONG TERM GOAL #1   Title Patient wears prosthesis daily >66% of awake hours without pain or skin issues.  (Target Date: 05/12/14)   Time 8   Period Weeks   Status New   PT LONG TERM GOAL #2   Title verbalizes proper prosthetic care  (Target Date: 05/12/14)   Time 8   Period Weeks   Status New   PT LONG TERM GOAL #3   Title ambulates 100' with rolling walker & prosthesis modified independent.  (Target Date: 05/12/14)   Time 8   Period Weeks   Status New   PT LONG TERM GOAL #4   Title negotiates ramp & curb with rolling walker & prosthesis with daughter minimal assist safely  (Target Date: 05/12/14)   Time 8   Period Weeks   Status New   PT LONG TERM GOAL #5   Title reaches into upper / lower cabinets & manages clothes in standing with walker /counter support modified independent.  (Target Date: 05/12/14)   Time 8   Period Weeks   Status New   Additional Long Term Goals   Additional Long Term Goals Yes   PT LONG TERM GOAL #6   Title Functional Status self-report on FOTO improves by 10 points.  (Target Date: 05/12/14)   Time 8   Period Weeks   Status New               Plan - 03/22/14 1530    Clinical Impression Statement Patient needs adduction angle of socket checked, add a torser pad to back to decrease rotation in  sitting. PT set-up appt with prosthetist on 03/27/13. Patient improved sit to/from stand with visual / verbal cue to weight shift to front of walker to "smell rose" Patient daughter plans to purchase artifical rose to attach to walker as visual cue to remember   Pt will benefit from skilled therapeutic intervention in order to improve on the following deficits Abnormal gait;Decreased activity tolerance;Decreased balance;Decreased endurance;Decreased knowledge of use of DME;Decreased mobility;Decreased strength   Rehab Potential Good   PT Frequency 2x / week   PT Duration 8 weeks   PT Treatment/Interventions ADLs/Self Care Home Management;DME Instruction;Gait training;Stair training;Functional mobility training;Therapeutic activities;Therapeutic exercise;Balance training;Neuromuscular re-education;Patient/family education;Other (comment)  prosthetic training   PT Next Visit Plan review sit to /from stand,   PT Home Exercise Plan balance & midline at sink   Consulted and Agree with Plan of Care Patient;Family member/caregiver   Family Member Consulted daughter        Problem List Patient Active Problem List   Diagnosis Date Noted  . Essential hypertension, benign 11/16/2013  . Peripheral vascular disease, unspecified 07/20/2013  . Discoloration of skin-Right leg 07/15/2013  . Cold feeling-Right Leg 07/15/2013  . Pain in limb-Right Leg 07/15/2013  . Atherosclerotic PVD with ulceration 04/13/2013  . Critical lower limb ischemia 04/04/2013  . Claudication 04/03/2013  . UTI (lower urinary tract infection) 01/25/2013  . Urosepsis 01/25/2013  . Long term (current) use of anticoagulants 05/12/2012  . PAD (peripheral artery disease) 03/03/2012  . Liver cyst 02/26/2012  . Cyst, kidney, acquired 02/26/2012  . Kidney stone 02/26/2012  . Chronic kidney disease, stage 3 11/19/2011  . COPD (chronic obstructive pulmonary disease) 03/18/2011  . Osteoporosis 12/17/2010  . Type II  diabetes mellitus,  uncontrolled   . PVD (peripheral vascular disease)   . Mixed hyperlipidemia   . Renal artery stenosis   . Memory loss, short term   . Pulmonary nodule   . CAD (coronary artery disease)   . CHF (congestive heart failure)   . Anemia     Chryl Holten PT, DPT 03/23/2014, 1:37 PM  [facility] [address] [place], [place], [address] Phone: [phone]   Fax:  [phone]

## 2014-03-28 ENCOUNTER — Encounter: Payer: Self-pay | Admitting: Physical Therapy

## 2014-03-28 ENCOUNTER — Ambulatory Visit: Payer: Medicare Other | Admitting: Physical Therapy

## 2014-03-28 DIAGNOSIS — Z89611 Acquired absence of right leg above knee: Secondary | ICD-10-CM

## 2014-03-28 DIAGNOSIS — R269 Unspecified abnormalities of gait and mobility: Secondary | ICD-10-CM | POA: Diagnosis not present

## 2014-03-28 DIAGNOSIS — R2689 Other abnormalities of gait and mobility: Secondary | ICD-10-CM

## 2014-03-28 DIAGNOSIS — R531 Weakness: Secondary | ICD-10-CM

## 2014-03-28 DIAGNOSIS — R6889 Other general symptoms and signs: Secondary | ICD-10-CM

## 2014-03-28 NOTE — Therapy (Signed)
[facility] [address] [place], [place], [address] Phone: [phone]   Fax:  [phone]  Physical Therapy Treatment  Patient Details  Name: Suzanne [name] MRN: [id number] Date of Birth: Aug 18, 1921 Referring Provider:  Darlyne Russian, MD  Encounter Date: 03/28/2014      PT End of Session - 03/28/14 1100    Visit Number 3   Number of Visits 17   Date for PT Re-Evaluation 05/12/14   PT Start Time 1100   PT Stop Time 1145   PT Time Calculation (min) 45 min   Equipment Utilized During Treatment Gait belt   Activity Tolerance Patient tolerated treatment well   Behavior During Therapy [facility] for tasks assessed/performed      Past Medical History  Diagnosis Date  . PVD (peripheral vascular disease)     a. PTA & stent right superficial artery PTA & left common iliac artery stenting. Right fem-pop bypass graft. b. Per Dr. Kennon Holter note: h/o aortobifemoral bypass grafting remotely as well as fem-peroneal bypass grafting.  . Mixed hyperlipidemia   . Compression fx, thoracic spine     T12  . Ischium fracture   . Memory loss, short term   . Pulmonary nodule   . Chronic diastolic CHF (congestive heart failure) 09-2008    a. EF 40% by cath 2010. b. Improved - last echo 2015 with normal EF.  [name] Anemia   . Detached retina 02-2005    R EYE  . Fracture 02-2007    L5 AND PELVIS  . Left ventricular dysfunction     a. EF 40% by cath 2010. b. Improved - last echo 2015 with normal EF.   [name] AAA (abdominal aortic aneurysm)     Repair unknown date  . Critical lower limb ischemia   . Chronic respiratory failure     a. On home O2.  [name] Hypertension   . Warfarin anticoagulation     DC summary from 2010 reports she is "on chronic  Coumadin for vascular disease." This is listed on her med list going back to 2006 by records available. Teaching service H&P remotely says she has h/o DVT but I cannot find any evidence of this. Admission H/P  03/2013 suggests history of atrial fib but all ECGs appear sinus and there is no mention of this previously.  [name] COPD (chronic obstructive pulmonary disease)   . Type II or unspecified type diabetes mellitus without mention of complication, not stated as uncontrolled   . Complication of anesthesia     hard to wake up, bp problems   . Pneumonia     hx  . GERD (gastroesophageal reflux disease)   . Arthritis   . CAD (coronary artery disease) 10/12/2008    a. 30% LAD,30% CX & 40% tandem mid stenosis in the AV groove, 50-60% RCA  . Renal artery stenosis     a. 1-59% by doppler in 2014.; CKD stage III (Dr. Erling Cruz)  . On home oxygen therapy     "2.5L O2 at sleep or w/increased activity" (09/27/2013)    Past Surgical History  Procedure Laterality Date  . Abdominal aortic aneurysm repair  98    "lower left ventricle"  . Aorto-femoral bypass graft Bilateral     S/P aortobifemoral bypass grafting and right fem-peroneal bypass grafting remotely  . Cholecystectomy    . Cataract extraction Right 06-2006  . Vascular surgery    . Cardiac catheterization    . Retinal detachment surgery Right     "  put buckle in; it popped & they had to remove it"  . Eye surgery    . Patch angioplasty Left 04/19/2013    Procedure: VEIN PATCH ANGIOPLASTY - LEFT COMMON FEMORAL ARTERY;  Surgeon: Angelia Mould, MD;  Location: [facility];  Service: Vascular;  Laterality: Left;  . Femoral-popliteal bypass graft Left 04/19/2013    Procedure: BYPASS GRAFT FEMORAL- BELOW KNEE POPLITEAL ARTERY WITH VEIN;  Surgeon: Angelia Mould, MD;  Location: [facility];  Service: Vascular;  Laterality: Left;  . Fracture surgery      sternum  2008 no surgery  . Above knee leg amputation Right 09/27/2013  . Femoral-peroneal bypass graft    . Aorto-femoral bypass graft    . Patella fracture surgery Right ~ 1987    "shattered; work related injury"  . Total abdominal hysterectomy    . Amputation Right 09/27/2013    Procedure: AMPUTATION  ABOVE KNEE-RIGHT;  Surgeon: Angelia Mould, MD;  Location: [facility];  Service: Vascular;  Laterality: Right;  . Lower extremity angiogram Left 04/04/2013    Procedure: LOWER EXTREMITY ANGIOGRAM;  Surgeon: Lorretta Harp, MD;  Location: [facility] CATH LAB;  Service: Cardiovascular;  Laterality: Left;    There were no vitals taken for this visit.  Visit Diagnosis:  Abnormality of gait  Activity intolerance  Weakness generalized  Balance problem  Status post above knee amputation of right lower extremity      Subjective Assessment - 03/28/14 1100    Symptoms Reports wearing prosthesis daily now. She tried cushion under left buttocks but was not comfortable.   Currently in Pain? No/denies      Prosthetic Training: PT instructed / reviewed proper rotation with donning while performing. Patient verbalized better understanding.  Patient ambulates 65' X 2 with rolling walker & prosthesis with minimal guard / cues on wt shift and pelvis. PT cued on pursed lip breathing with emphasis on exhalation.  Parallel bars with mirrors for visual input and manual, verbal cues working on initiating swing with pelvis, initial contact with heel and wt shift over prosthesis in stance. Sit to/from stand wc to RW with cues on wt shift & prosthetic control.                      PT Education - 03/28/14 1100    Education provided Yes   Education Details Using pelvis to intiate swing and wt shift in stance. Limiting talking while walking to help with SOB.   Person(s) Educated Patient   Methods Explanation;Demonstration   Comprehension Verbalized understanding;Returned demonstration;Need further instruction          PT Short Term Goals - 03/15/14 1100    PT SHORT TERM GOAL #1   Title wears prostheis >/= 4 days /wk for >/= 50% of awake hours. (Target Date: 04/14/14)   Time 4   Period Weeks   Status New   PT SHORT TERM GOAL #2   Title reaches 5" anteriorly & manages pants for toileting in  standing with rolling walker safely with no balance loss.  (Target Date: 04/14/14)   Time 4   Period Weeks   Status New   PT SHORT TERM GOAL #3   Title ambulates 100' with rolling walker & prosthesis with supervision.  (Target Date: 04/14/14)   Time 4   Period Weeks   Status New           PT Long Term Goals - 03/16/14 1256    PT LONG TERM  GOAL #1   Title Patient wears prosthesis daily >66% of awake hours without pain or skin issues.  (Target Date: 05/12/14)   Time 8   Period Weeks   Status New   PT LONG TERM GOAL #2   Title verbalizes proper prosthetic care  (Target Date: 05/12/14)   Time 8   Period Weeks   Status New   PT LONG TERM GOAL #3   Title ambulates 100' with rolling walker & prosthesis modified independent.  (Target Date: 05/12/14)   Time 8   Period Weeks   Status New   PT LONG TERM GOAL #4   Title negotiates ramp & curb with rolling walker & prosthesis with daughter minimal assist safely  (Target Date: 05/12/14)   Time 8   Period Weeks   Status New   PT LONG TERM GOAL #5   Title reaches into upper / lower cabinets & manages clothes in standing with walker /counter support modified independent.  (Target Date: 05/12/14)   Time 8   Period Weeks   Status New   Additional Long Term Goals   Additional Long Term Goals Yes   PT LONG TERM GOAL #6   Title Functional Status self-report on FOTO improves by 10 points.  (Target Date: 05/12/14)   Time 8   Period Weeks   Status New               Plan - 03/28/14 1100    Clinical Impression Statement Patient saw prosthetist who made changes requested and less pain /discomfort. Patient improved prosthetic control with instruction and practice in using pelvis for controling motion.   Pt will benefit from skilled therapeutic intervention in order to improve on the following deficits Abnormal gait;Decreased activity tolerance;Decreased balance;Decreased endurance;Decreased knowledge of use of DME;Decreased mobility;Decreased  strength   Rehab Potential Good   PT Frequency 2x / week   PT Duration 8 weeks   PT Treatment/Interventions ADLs/Self Care Home Management;DME Instruction;Gait training;Stair training;Functional mobility training;Therapeutic activities;Therapeutic exercise;Balance training;Neuromuscular re-education;Patient/family education;Other (comment)  prosthetic training   PT Next Visit Plan gait with rolling walker, balance activities   Consulted and Agree with Plan of Care Patient;Family member/caregiver   Family Member Consulted daughter        Problem List Patient Active Problem List   Diagnosis Date Noted  . Essential hypertension, benign 11/16/2013  . Peripheral vascular disease, unspecified 07/20/2013  . Discoloration of skin-Right leg 07/15/2013  . Cold feeling-Right Leg 07/15/2013  . Pain in limb-Right Leg 07/15/2013  . Atherosclerotic PVD with ulceration 04/13/2013  . Critical lower limb ischemia 04/04/2013  . Claudication 04/03/2013  . UTI (lower urinary tract infection) 01/25/2013  . Urosepsis 01/25/2013  . Long term (current) use of anticoagulants 05/12/2012  . PAD (peripheral artery disease) 03/03/2012  . Liver cyst 02/26/2012  . Cyst, kidney, acquired 02/26/2012  . Kidney stone 02/26/2012  . Chronic kidney disease, stage 3 11/19/2011  . COPD (chronic obstructive pulmonary disease) 03/18/2011  . Osteoporosis 12/17/2010  . Type II diabetes mellitus, uncontrolled   . PVD (peripheral vascular disease)   . Mixed hyperlipidemia   . Renal artery stenosis   . Memory loss, short term   . Pulmonary nodule   . CAD (coronary artery disease)   . CHF (congestive heart failure)   . Anemia     Jamey Reas PT, DPT Physical Therapist Specializing in Prosthetics 03/28/2014, 7:22 PM  [facility] [address] [place], [place], [address] Phone: [phone]  Fax:  [phone]

## 2014-04-02 ENCOUNTER — Other Ambulatory Visit: Payer: Self-pay | Admitting: Emergency Medicine

## 2014-04-04 ENCOUNTER — Ambulatory Visit: Payer: Medicare Other | Admitting: Physical Therapy

## 2014-04-04 ENCOUNTER — Encounter: Payer: Self-pay | Admitting: Physical Therapy

## 2014-04-04 DIAGNOSIS — R269 Unspecified abnormalities of gait and mobility: Secondary | ICD-10-CM | POA: Diagnosis not present

## 2014-04-04 DIAGNOSIS — R531 Weakness: Secondary | ICD-10-CM

## 2014-04-04 DIAGNOSIS — Z89611 Acquired absence of right leg above knee: Secondary | ICD-10-CM

## 2014-04-04 DIAGNOSIS — R2689 Other abnormalities of gait and mobility: Secondary | ICD-10-CM

## 2014-04-04 DIAGNOSIS — R6889 Other general symptoms and signs: Secondary | ICD-10-CM

## 2014-04-05 NOTE — Therapy (Signed)
[facility] [address] [place], [place], [address] Phone: [phone]   Fax:  [phone]  Physical Therapy Treatment  Patient Details  Name: Suzanne [name] MRN: [id number] Date of Birth: 09-07-1921 Referring Provider:  Darlyne Russian, MD  Encounter Date: 04/04/2014      PT End of Session - 04/04/14 1445    Visit Number 4   Number of Visits 17   Date for PT Re-Evaluation 05/12/14   PT Start Time 6387   PT Stop Time 1530   PT Time Calculation (min) 45 min   Equipment Utilized During Treatment Gait belt   Activity Tolerance Patient tolerated treatment well   Behavior During Therapy [facility] for tasks assessed/performed      Past Medical History  Diagnosis Date  . PVD (peripheral vascular disease)     a. PTA & stent right superficial artery PTA & left common iliac artery stenting. Right fem-pop bypass graft. b. Per Dr. Kennon Holter note: h/o aortobifemoral bypass grafting remotely as well as fem-peroneal bypass grafting.  . Mixed hyperlipidemia   . Compression fx, thoracic spine     T12  . Ischium fracture   . Memory loss, short term   . Pulmonary nodule   . Chronic diastolic CHF (congestive heart failure) 09-2008    a. EF 40% by cath 2010. b. Improved - last echo 2015 with normal EF.  [name] Anemia   . Detached retina 02-2005    R EYE  . Fracture 02-2007    L5 AND PELVIS  . Left ventricular dysfunction     a. EF 40% by cath 2010. b. Improved - last echo 2015 with normal EF.   [name] AAA (abdominal aortic aneurysm)     Repair unknown date  . Critical lower limb ischemia   . Chronic respiratory failure     a. On home O2.  [name] Hypertension   . Warfarin anticoagulation     DC summary from 2010 reports she is "on chronic  Coumadin for vascular disease." This is listed on her med list going back to 2006 by records available. Teaching service H&P remotely says she has h/o DVT but I cannot find any evidence of this. Admission H/P  03/2013 suggests history of atrial fib but all ECGs appear sinus and there is no mention of this previously.  [name] COPD (chronic obstructive pulmonary disease)   . Type II or unspecified type diabetes mellitus without mention of complication, not stated as uncontrolled   . Complication of anesthesia     hard to wake up, bp problems   . Pneumonia     hx  . GERD (gastroesophageal reflux disease)   . Arthritis   . CAD (coronary artery disease) 10/12/2008    a. 30% LAD,30% CX & 40% tandem mid stenosis in the AV groove, 50-60% RCA  . Renal artery stenosis     a. 1-59% by doppler in 2014.; CKD stage III (Dr. Erling Cruz)  . On home oxygen therapy     "2.5L O2 at sleep or w/increased activity" (09/27/2013)    Past Surgical History  Procedure Laterality Date  . Abdominal aortic aneurysm repair  98    "lower left ventricle"  . Aorto-femoral bypass graft Bilateral     S/P aortobifemoral bypass grafting and right fem-peroneal bypass grafting remotely  . Cholecystectomy    . Cataract extraction Right 06-2006  . Vascular surgery    . Cardiac catheterization    . Retinal detachment surgery Right     "  put buckle in; it popped & they had to remove it"  . Eye surgery    . Patch angioplasty Left 04/19/2013    Procedure: VEIN PATCH ANGIOPLASTY - LEFT COMMON FEMORAL ARTERY;  Surgeon: Angelia Mould, MD;  Location: [facility];  Service: Vascular;  Laterality: Left;  . Femoral-popliteal bypass graft Left 04/19/2013    Procedure: BYPASS GRAFT FEMORAL- BELOW KNEE POPLITEAL ARTERY WITH VEIN;  Surgeon: Angelia Mould, MD;  Location: [facility];  Service: Vascular;  Laterality: Left;  . Fracture surgery      sternum  2008 no surgery  . Above knee leg amputation Right 09/27/2013  . Femoral-peroneal bypass graft    . Aorto-femoral bypass graft    . Patella fracture surgery Right ~ 1987    "shattered; work related injury"  . Total abdominal hysterectomy    . Amputation Right 09/27/2013    Procedure: AMPUTATION  ABOVE KNEE-RIGHT;  Surgeon: Angelia Mould, MD;  Location: [facility];  Service: Vascular;  Laterality: Right;  . Lower extremity angiogram Left 04/04/2013    Procedure: LOWER EXTREMITY ANGIOGRAM;  Surgeon: Lorretta Harp, MD;  Location: [facility] CATH LAB;  Service: Cardiovascular;  Laterality: Left;    There were no vitals taken for this visit.  Visit Diagnosis:  Abnormality of gait  Activity intolerance  Weakness generalized  Balance problem  Status post above knee amputation of right lower extremity    Prosthetic Training: PT instructed patient and daughter in donning with proper rotation & pelvic alignment and use of hand sanitizer on suction ring with donning liner to make easier. PT instructed in wear needs to be consistent daily & risk of skin breakdown &/or tenderness with large fluctuations like no wear to 6 hours. PT recommended wear 2x/day totaling 6-8hours. PT instructed in ambulating with family & friends with greater frequency over distance even if only 10' from one chair to another and to sit on couch, recliner and dining room chair with family available to help. Patient and daughter verbalize understanding of all of above. Sit to stand w/c to RW with contact /tactile cues on weight shift. Patient ambulated 25' X 2 and 14' with rolling walker & prosthesis with minimal assist and cues on posture, advancing prosthesis with initial contact with heel and wt shift over prosthesis in stance.                      PT Education - 04/04/14 1530    Education provided Yes   Education Details wear daily, increase frequency of gait first and sit on difficult to arise from chairs when family available to help instead of avoiding all together   Person(s) Educated Patient;Child(ren)   Methods Explanation   Comprehension Verbalized understanding          PT Short Term Goals - 03/15/14 1100    PT SHORT TERM GOAL #1   Title wears prostheis >/= 4 days /wk for >/= 50% of  awake hours. (Target Date: 04/14/14)   Time 4   Period Weeks   Status New   PT SHORT TERM GOAL #2   Title reaches 5" anteriorly & manages pants for toileting in standing with rolling walker safely with no balance loss.  (Target Date: 04/14/14)   Time 4   Period Weeks   Status New   PT SHORT TERM GOAL #3   Title ambulates 100' with rolling walker & prosthesis with supervision.  (Target Date: 04/14/14)   Time 4  Period Weeks   Status New           PT Long Term Goals - 03/16/14 1256    PT LONG TERM GOAL #1   Title Patient wears prosthesis daily >66% of awake hours without pain or skin issues.  (Target Date: 05/12/14)   Time 8   Period Weeks   Status New   PT LONG TERM GOAL #2   Title verbalizes proper prosthetic care  (Target Date: 05/12/14)   Time 8   Period Weeks   Status New   PT LONG TERM GOAL #3   Title ambulates 100' with rolling walker & prosthesis modified independent.  (Target Date: 05/12/14)   Time 8   Period Weeks   Status New   PT LONG TERM GOAL #4   Title negotiates ramp & curb with rolling walker & prosthesis with daughter minimal assist safely  (Target Date: 05/12/14)   Time 8   Period Weeks   Status New   PT LONG TERM GOAL #5   Title reaches into upper / lower cabinets & manages clothes in standing with walker /counter support modified independent.  (Target Date: 05/12/14)   Time 8   Period Weeks   Status New   Additional Long Term Goals   Additional Long Term Goals Yes   PT LONG TERM GOAL #6   Title Functional Status self-report on FOTO improves by 10 points.  (Target Date: 05/12/14)   Time 8   Period Weeks   Status New               Plan - 04/04/14 1530    Clinical Impression Statement Patient seems to understand need to increase wear to improve function with prosthesis. She seems to understand need for consistency with wear to build up tolerance /conditioning for prosthesis. However daughter indicates that she has heard this previously and did  not carry thru.   Pt will benefit from skilled therapeutic intervention in order to improve on the following deficits Abnormal gait;Decreased activity tolerance;Decreased balance;Decreased endurance;Decreased knowledge of use of DME;Decreased mobility;Decreased strength   Rehab Potential Good   PT Frequency 2x / week   PT Duration 8 weeks   PT Treatment/Interventions ADLs/Self Care Home Management;DME Instruction;Gait training;Stair training;Functional mobility training;Therapeutic activities;Therapeutic exercise;Balance training;Neuromuscular re-education;Patient/family education;Other (comment)  prosthetic training   PT Next Visit Plan gait with rolling walker, balance activities   Consulted and Agree with Plan of Care Patient;Family member/caregiver   Family Member Consulted daughter        Problem List Patient Active Problem List   Diagnosis Date Noted  . Essential hypertension, benign 11/16/2013  . Peripheral vascular disease, unspecified 07/20/2013  . Discoloration of skin-Right leg 07/15/2013  . Cold feeling-Right Leg 07/15/2013  . Pain in limb-Right Leg 07/15/2013  . Atherosclerotic PVD with ulceration 04/13/2013  . Critical lower limb ischemia 04/04/2013  . Claudication 04/03/2013  . UTI (lower urinary tract infection) 01/25/2013  . Urosepsis 01/25/2013  . Long term (current) use of anticoagulants 05/12/2012  . PAD (peripheral artery disease) 03/03/2012  . Liver cyst 02/26/2012  . Cyst, kidney, acquired 02/26/2012  . Kidney stone 02/26/2012  . Chronic kidney disease, stage 3 11/19/2011  . COPD (chronic obstructive pulmonary disease) 03/18/2011  . Osteoporosis 12/17/2010  . Type II diabetes mellitus, uncontrolled   . PVD (peripheral vascular disease)   . Mixed hyperlipidemia   . Renal artery stenosis   . Memory loss, short term   . Pulmonary nodule   . CAD (  coronary artery disease)   . CHF (congestive heart failure)   . Anemia     Rindi Beechy PT,  DPT 04/05/2014, 9:21 AM  [facility] [address] [place], [place], [address] Phone: [phone]   Fax:  [phone]

## 2014-04-14 ENCOUNTER — Ambulatory Visit: Payer: Medicare Other | Admitting: Physical Therapy

## 2014-04-14 ENCOUNTER — Encounter: Payer: Self-pay | Admitting: Physical Therapy

## 2014-04-14 DIAGNOSIS — Z89611 Acquired absence of right leg above knee: Secondary | ICD-10-CM

## 2014-04-14 DIAGNOSIS — R269 Unspecified abnormalities of gait and mobility: Secondary | ICD-10-CM | POA: Diagnosis not present

## 2014-04-14 DIAGNOSIS — R531 Weakness: Secondary | ICD-10-CM

## 2014-04-14 DIAGNOSIS — R2689 Other abnormalities of gait and mobility: Secondary | ICD-10-CM

## 2014-04-14 DIAGNOSIS — R6889 Other general symptoms and signs: Secondary | ICD-10-CM

## 2014-04-14 NOTE — Therapy (Signed)
[facility] [address] [place], [place], [address] Phone: [phone]   Fax:  [phone]  Physical Therapy Treatment  Patient Details  Name: Suzanne [name] MRN: [id number] Date of Birth: [birth year] Referring Provider:  Darlyne Russian, MD  Encounter Date: 04/14/2014      PT End of Session - 04/14/14 1630    Visit Number 5   Number of Visits 17   Date for PT Re-Evaluation 05/12/14   PT Start Time 8341   PT Stop Time 1615   PT Time Calculation (min) 45 min   Equipment Utilized During Treatment Gait belt   Activity Tolerance Patient tolerated treatment well   Behavior During Therapy [facility] for tasks assessed/performed      Past Medical History  Diagnosis Date  . PVD (peripheral vascular disease)     a. PTA & stent right superficial artery PTA & left common iliac artery stenting. Right fem-pop bypass graft. b. Per Dr. Kennon Holter note: h/o aortobifemoral bypass grafting remotely as well as fem-peroneal bypass grafting.  . Mixed hyperlipidemia   . Compression fx, thoracic spine     T12  . Ischium fracture   . Memory loss, short term   . Pulmonary nodule   . Chronic diastolic CHF (congestive heart failure) 09-2008    a. EF 40% by cath 2010. b. Improved - last echo 2015 with normal EF.  [name] Anemia   . Detached retina 02-2005    R EYE  . Fracture 02-2007    L5 AND PELVIS  . Left ventricular dysfunction     a. EF 40% by cath 2010. b. Improved - last echo 2015 with normal EF.   [name] AAA (abdominal aortic aneurysm)     Repair unknown date  . Critical lower limb ischemia   . Chronic respiratory failure     a. On home O2.  [name] Hypertension   . Warfarin anticoagulation     DC summary from 2010 reports she is "on chronic  Coumadin for vascular disease." This is listed on her med list going back to 2006 by records available. Teaching service H&P remotely says she has h/o DVT but I cannot find any evidence of this. Admission H/P  03/2013 suggests history of atrial fib but all ECGs appear sinus and there is no mention of this previously.  [name] COPD (chronic obstructive pulmonary disease)   . Type II or unspecified type diabetes mellitus without mention of complication, not stated as uncontrolled   . Complication of anesthesia     hard to wake up, bp problems   . Pneumonia     hx  . GERD (gastroesophageal reflux disease)   . Arthritis   . CAD (coronary artery disease) 10/12/2008    a. 30% LAD,30% CX & 40% tandem mid stenosis in the AV groove, 50-60% RCA  . Renal artery stenosis     a. 1-59% by doppler in 2014.; CKD stage III (Dr. Erling Cruz)  . On home oxygen therapy     "2.5L O2 at sleep or w/increased activity" (09/27/2013)    Past Surgical History  Procedure Laterality Date  . Abdominal aortic aneurysm repair  98    "lower left ventricle"  . Aorto-femoral bypass graft Bilateral     S/P aortobifemoral bypass grafting and right fem-peroneal bypass grafting remotely  . Cholecystectomy    . Cataract extraction Right 06-2006  . Vascular surgery    . Cardiac catheterization    . Retinal detachment surgery Right     "  put buckle in; it popped & they had to remove it"  . Eye surgery    . Patch angioplasty Left 04/19/2013    Procedure: VEIN PATCH ANGIOPLASTY - LEFT COMMON FEMORAL ARTERY;  Surgeon: Angelia Mould, MD;  Location: [facility];  Service: Vascular;  Laterality: Left;  . Femoral-popliteal bypass graft Left 04/19/2013    Procedure: BYPASS GRAFT FEMORAL- BELOW KNEE POPLITEAL ARTERY WITH VEIN;  Surgeon: Angelia Mould, MD;  Location: [facility];  Service: Vascular;  Laterality: Left;  . Fracture surgery      sternum  2008 no surgery  . Above knee leg amputation Right 09/27/2013  . Femoral-peroneal bypass graft    . Aorto-femoral bypass graft    . Patella fracture surgery Right ~ 1987    "shattered; work related injury"  . Total abdominal hysterectomy    . Amputation Right 09/27/2013    Procedure: AMPUTATION  ABOVE KNEE-RIGHT;  Surgeon: Angelia Mould, MD;  Location: [facility];  Service: Vascular;  Laterality: Right;  . Lower extremity angiogram Left 04/04/2013    Procedure: LOWER EXTREMITY ANGIOGRAM;  Surgeon: Lorretta Harp, MD;  Location: [facility] CATH LAB;  Service: Cardiovascular;  Laterality: Left;    There were no vitals taken for this visit.  Visit Diagnosis:  Abnormality of gait  Activity intolerance  Weakness generalized  Balance problem  Status post above knee amputation of right lower extremity      Subjective Assessment - 04/14/14 1634    Symptoms Patient reports wear 2x/day for 2 hours in morning and 2-4 hours in afternoon. Daughter brought video of patient transfer w/c to her recliner.   Currently in Pain? No/denies     Prosthetic Training:  PT reviewed video taken by daughter of patient transferring between w/c and her recliner without her prosthesis. She appeared safe without assistance. PT requested daughter video patient standing up from recliner. PT instructed in standing up and gait technique of 1) moving RW by extending elbows 2) place prosthetic heel on floor out in front without lifting up 3) shift weight forward over prosthesis 4) step with left leg. PT wrote directions in large print. Patient read & verbalized understanding. PT instructed to read only 5xday and envision herself doing each step. Patient and daughter verbalized understanding. Patient sit to stand with verbal cues. Patient ambulated 33' with RW & prosthesis with minimal guard / contact assist.                       PT Education - 04/14/14 1641    Education provided Yes   Education Details sit to stand technique, gait sequence / technique,    Person(s) Educated Patient   Methods Explanation;Demonstration;Handout   Comprehension Verbalized understanding;Returned demonstration;Verbal cues required;Tactile cues required;Need further instruction          PT Short Term Goals -  04/14/14 1655    PT SHORT TERM GOAL #1   Title wears prostheis >/= 4 days /wk for >/= 50% of awake hours. (Target Date: 04/14/14)   Baseline MET 04/14/14 as reports wear total of 6 hours daily over last week and out of bed ~10-12 hours.   Time 4   Period Weeks   Status Achieved   PT SHORT TERM GOAL #2   Title reaches 5" anteriorly & manages pants for toileting in standing with rolling walker safely with no balance loss.  (Target Date: 04/14/14)   Baseline MET 04/14/14 patient reaches 5" anteriorly with RW support and reports  managing pants while standing.   Time 4   Period Weeks   Status Partially Met   PT SHORT TERM GOAL #3   Title ambulates 62' with rolling walker & prosthesis with supervision.  (NEW Target Date: 05/12/14)   Baseline NOT MET 04/14/14 Patient ambulates 23' with RW & prosthesis with minimal gaurd /contact assist.    Time 4   Period Weeks   Status Revised           PT Long Term Goals - 04/14/14 1659    PT LONG TERM GOAL #1   Title Patient wears prosthesis daily >66% of awake hours without pain or skin issues.  (Target Date: 05/12/14)   Time 8   Period Weeks   Status On-going   PT LONG TERM GOAL #2   Title verbalizes proper prosthetic care with daughter's verbal assistance as needed  (Target Date: 05/12/14)   Time 8   Period Weeks   Status On-going   PT LONG TERM GOAL #3   Title ambulates 20' with rolling walker & prosthesis modified independent.  (Target Date: 05/12/14)   Time 8   Period Weeks   Status Revised   PT LONG TERM GOAL #4   Title negotiates ramp & curb with rolling walker & prosthesis with daughter minimal assist safely  (Target Date: 05/12/14)   Time 8   Period Weeks   Status On-going   PT LONG TERM GOAL #5   Title reaches into upper / lower cabinets & manages clothes in standing with walker /counter support modified independent.  (Target Date: 05/12/14)   Time 8   Period Weeks   Status On-going   PT LONG TERM GOAL #6   Title Functional Status  self-report on FOTO improves by 10 points.  (Target Date: 05/12/14)   Time 8   Period Weeks   Status On-going               Plan - 04/14/14 1646    Clinical Impression Statement patient seems to understand gait techniques better and how technique allows greater distance covered by same number of steps. patient will need more practice to improve safety. Patient met 2 of 3 STGs and progressed towards gait goal. Distance on LTG needs to be decreaed due to pulmonary issues will limit how far she can safely walk.   Pt will benefit from skilled therapeutic intervention in order to improve on the following deficits Abnormal gait;Decreased activity tolerance;Decreased balance;Decreased endurance;Decreased knowledge of use of DME;Decreased mobility;Decreased strength   Rehab Potential Good   PT Frequency 2x / week   PT Duration 8 weeks   PT Treatment/Interventions ADLs/Self Care Home Management;DME Instruction;Gait training;Stair training;Functional mobility training;Therapeutic activities;Therapeutic exercise;Balance training;Neuromuscular re-education;Patient/family education;Other (comment)  prosthetic training   PT Next Visit Plan gait with rolling walker, balance activities   Consulted and Agree with Plan of Care Patient;Family member/caregiver   Family Member Consulted daughter        Problem List Patient Active Problem List   Diagnosis Date Noted  . Essential hypertension, benign 11/16/2013  . Peripheral vascular disease, unspecified 07/20/2013  . Discoloration of skin-Right leg 07/15/2013  . Cold feeling-Right Leg 07/15/2013  . Pain in limb-Right Leg 07/15/2013  . Atherosclerotic PVD with ulceration 04/13/2013  . Critical lower limb ischemia 04/04/2013  . Claudication 04/03/2013  . UTI (lower urinary tract infection) 01/25/2013  . Urosepsis 01/25/2013  . Long term (current) use of anticoagulants 05/12/2012  . PAD (peripheral artery disease) 03/03/2012  . Liver cyst  02/26/2012  . Cyst, kidney, acquired 02/26/2012  . Kidney stone 02/26/2012  . Chronic kidney disease, stage 3 11/19/2011  . COPD (chronic obstructive pulmonary disease) 03/18/2011  . Osteoporosis 12/17/2010  . Type II diabetes mellitus, uncontrolled   . PVD (peripheral vascular disease)   . Mixed hyperlipidemia   . Renal artery stenosis   . Memory loss, short term   . Pulmonary nodule   . CAD (coronary artery disease)   . CHF (congestive heart failure)   . Anemia     Jamey Reas  PT, DPT Physical Therapist Specializing in Prosthetics 04/14/2014, [facility] [address] [place], [place], [address] Phone: [phone]   Fax:  [phone]

## 2014-04-18 ENCOUNTER — Ambulatory Visit: Payer: Medicare Other | Attending: Vascular Surgery | Admitting: Physical Therapy

## 2014-04-18 ENCOUNTER — Encounter: Payer: Self-pay | Admitting: Physical Therapy

## 2014-04-18 DIAGNOSIS — R6889 Other general symptoms and signs: Secondary | ICD-10-CM | POA: Diagnosis not present

## 2014-04-18 DIAGNOSIS — R269 Unspecified abnormalities of gait and mobility: Secondary | ICD-10-CM | POA: Insufficient documentation

## 2014-04-18 DIAGNOSIS — R531 Weakness: Secondary | ICD-10-CM | POA: Diagnosis not present

## 2014-04-18 DIAGNOSIS — R2689 Other abnormalities of gait and mobility: Secondary | ICD-10-CM

## 2014-04-18 DIAGNOSIS — R29818 Other symptoms and signs involving the nervous system: Secondary | ICD-10-CM | POA: Diagnosis not present

## 2014-04-18 DIAGNOSIS — Z89611 Acquired absence of right leg above knee: Secondary | ICD-10-CM

## 2014-04-18 NOTE — Therapy (Signed)
[facility] [address] [place], [place], [address] Phone: [phone]   Fax:  [phone]  Physical Therapy Treatment  Patient Details  Name: Suzanne [name] MRN: [id number] Date of Birth: 10-18-1921 Referring Provider:  Darlyne Russian, MD  Encounter Date: 04/18/2014      PT End of Session - 04/18/14 1639    Visit Number 6   Number of Visits 17   Date for PT Re-Evaluation 05/12/14   PT Start Time 1324   PT Stop Time 1620   PT Time Calculation (min) 50 min   Equipment Utilized During Treatment Gait belt   Activity Tolerance Patient tolerated treatment well   Behavior During Therapy [facility] for tasks assessed/performed      Past Medical History  Diagnosis Date  . PVD (peripheral vascular disease)     a. PTA & stent right superficial artery PTA & left common iliac artery stenting. Right fem-pop bypass graft. b. Per Dr. Kennon Holter note: h/o aortobifemoral bypass grafting remotely as well as fem-peroneal bypass grafting.  . Mixed hyperlipidemia   . Compression fx, thoracic spine     T12  . Ischium fracture   . Memory loss, short term   . Pulmonary nodule   . Chronic diastolic CHF (congestive heart failure) 09-2008    a. EF 40% by cath 2010. b. Improved - last echo 2015 with normal EF.  [name] Anemia   . Detached retina 02-2005    R EYE  . Fracture 02-2007    L5 AND PELVIS  . Left ventricular dysfunction     a. EF 40% by cath 2010. b. Improved - last echo 2015 with normal EF.   [name] AAA (abdominal aortic aneurysm)     Repair unknown date  . Critical lower limb ischemia   . Chronic respiratory failure     a. On home O2.  [name] Hypertension   . Warfarin anticoagulation     DC summary from 2010 reports she is "on chronic  Coumadin for vascular disease." This is listed on her med list going back to 2006 by records available. Teaching service H&P remotely says she has h/o DVT but I cannot find any evidence of this. Admission H/P  03/2013 suggests history of atrial fib but all ECGs appear sinus and there is no mention of this previously.  [name] COPD (chronic obstructive pulmonary disease)   . Type II or unspecified type diabetes mellitus without mention of complication, not stated as uncontrolled   . Complication of anesthesia     hard to wake up, bp problems   . Pneumonia     hx  . GERD (gastroesophageal reflux disease)   . Arthritis   . CAD (coronary artery disease) 10/12/2008    a. 30% LAD,30% CX & 40% tandem mid stenosis in the AV groove, 50-60% RCA  . Renal artery stenosis     a. 1-59% by doppler in 2014.; CKD stage III (Dr. Erling Cruz)  . On home oxygen therapy     "2.5L O2 at sleep or w/increased activity" (09/27/2013)    Past Surgical History  Procedure Laterality Date  . Abdominal aortic aneurysm repair  98    "lower left ventricle"  . Aorto-femoral bypass graft Bilateral     S/P aortobifemoral bypass grafting and right fem-peroneal bypass grafting remotely  . Cholecystectomy    . Cataract extraction Right 06-2006  . Vascular surgery    . Cardiac catheterization    . Retinal detachment surgery Right     "  put buckle in; it popped & they had to remove it"  . Eye surgery    . Patch angioplasty Left 04/19/2013    Procedure: VEIN PATCH ANGIOPLASTY - LEFT COMMON FEMORAL ARTERY;  Surgeon: Angelia Mould, MD;  Location: [facility];  Service: Vascular;  Laterality: Left;  . Femoral-popliteal bypass graft Left 04/19/2013    Procedure: BYPASS GRAFT FEMORAL- BELOW KNEE POPLITEAL ARTERY WITH VEIN;  Surgeon: Angelia Mould, MD;  Location: [facility];  Service: Vascular;  Laterality: Left;  . Fracture surgery      sternum  2008 no surgery  . Above knee leg amputation Right 09/27/2013  . Femoral-peroneal bypass graft    . Aorto-femoral bypass graft    . Patella fracture surgery Right ~ 1987    "shattered; work related injury"  . Total abdominal hysterectomy    . Amputation Right 09/27/2013    Procedure: AMPUTATION  ABOVE KNEE-RIGHT;  Surgeon: Angelia Mould, MD;  Location: [facility];  Service: Vascular;  Laterality: Right;  . Lower extremity angiogram Left 04/04/2013    Procedure: LOWER EXTREMITY ANGIOGRAM;  Surgeon: Lorretta Harp, MD;  Location: [facility] CATH LAB;  Service: Cardiovascular;  Laterality: Left;    There were no vitals taken for this visit.  Visit Diagnosis:  Abnormality of gait  Activity intolerance  Weakness generalized  Balance problem  Status post above knee amputation of right lower extremity      Subjective Assessment - 04/18/14 1536    Symptoms Read paper 5-10 weeks with envisioning standing up and walking as instructed   Currently in Pain? No/denies     Prosthetic Training: PT reviewed proper standing & gait. Sit to stand with minimal cues. Patient ambulated 25' X 2 with RW & prosthesis with SBA with cues on posture / RW movement including distance. Stand to sit with cues on foot placement to facilitate unweight prosthesis to enable to unlock.  PT wrote step by step directions for sit to stand to sit with instructions to read 5-10 times/day & envision self performing activity. PT reviewed with daughter at end of session since she did not come back during session today.                        PT Education - 04/18/14 1638    Education provided Yes   Education Details sit to stand to sit technique   Person(s) Educated Patient;Child(ren)   Methods Explanation;Demonstration;Handout   Comprehension Verbalized understanding;Returned demonstration;Verbal cues required;Need further instruction          PT Short Term Goals - 04/14/14 1655    PT SHORT TERM GOAL #1   Title wears prostheis >/= 4 days /wk for >/= 50% of awake hours. (Target Date: 04/14/14)   Baseline MET 04/14/14 as reports wear total of 6 hours daily over last week and out of bed ~10-12 hours.   Time 4   Period Weeks   Status Achieved   PT SHORT TERM GOAL #2   Title reaches 5"  anteriorly & manages pants for toileting in standing with rolling walker safely with no balance loss.  (Target Date: 04/14/14)   Baseline MET 04/14/14 patient reaches 5" anteriorly with RW support and reports managing pants while standing.   Time 4   Period Weeks   Status Partially Met   PT SHORT TERM GOAL #3   Title ambulates 19' with rolling walker & prosthesis with supervision.  (NEW Target Date: 05/12/14)   Baseline NOT  MET 04/14/14 Patient ambulates 23' with RW & prosthesis with minimal gaurd /contact assist.    Time 4   Period Weeks   Status Revised           PT Long Term Goals - 04/14/14 1659    PT LONG TERM GOAL #1   Title Patient wears prosthesis daily >66% of awake hours without pain or skin issues.  (Target Date: 05/12/14)   Time 8   Period Weeks   Status On-going   PT LONG TERM GOAL #2   Title verbalizes proper prosthetic care with daughter's verbal assistance as needed  (Target Date: 05/12/14)   Time 8   Period Weeks   Status On-going   PT LONG TERM GOAL #3   Title ambulates 20' with rolling walker & prosthesis modified independent.  (Target Date: 05/12/14)   Time 8   Period Weeks   Status Revised   PT LONG TERM GOAL #4   Title negotiates ramp & curb with rolling walker & prosthesis with daughter minimal assist safely  (Target Date: 05/12/14)   Time 8   Period Weeks   Status On-going   PT LONG TERM GOAL #5   Title reaches into upper / lower cabinets & manages clothes in standing with walker /counter support modified independent.  (Target Date: 05/12/14)   Time 8   Period Weeks   Status On-going   PT LONG TERM GOAL #6   Title Functional Status self-report on FOTO improves by 10 points.  (Target Date: 05/12/14)   Time 8   Period Weeks   Status On-going               Problem List Patient Active Problem List   Diagnosis Date Noted  . Essential hypertension, benign 11/16/2013  . Peripheral vascular disease, unspecified 07/20/2013  . Discoloration of  skin-Right leg 07/15/2013  . Cold feeling-Right Leg 07/15/2013  . Pain in limb-Right Leg 07/15/2013  . Atherosclerotic PVD with ulceration 04/13/2013  . Critical lower limb ischemia 04/04/2013  . Claudication 04/03/2013  . UTI (lower urinary tract infection) 01/25/2013  . Urosepsis 01/25/2013  . Long term (current) use of anticoagulants 05/12/2012  . PAD (peripheral artery disease) 03/03/2012  . Liver cyst 02/26/2012  . Cyst, kidney, acquired 02/26/2012  . Kidney stone 02/26/2012  . Chronic kidney disease, stage 3 11/19/2011  . COPD (chronic obstructive pulmonary disease) 03/18/2011  . Osteoporosis 12/17/2010  . Type II diabetes mellitus, uncontrolled   . PVD (peripheral vascular disease)   . Mixed hyperlipidemia   . Renal artery stenosis   . Memory loss, short term   . Pulmonary nodule   . CAD (coronary artery disease)   . CHF (congestive heart failure)   . Anemia     Rhapsody Wolven PT, DPT 04/18/2014, 4:40 PM  [facility] [address] [place], [place], [address] Phone: [phone]   Fax:  [phone]

## 2014-04-20 ENCOUNTER — Ambulatory Visit (INDEPENDENT_AMBULATORY_CARE_PROVIDER_SITE_OTHER): Payer: Medicare Other | Admitting: Emergency Medicine

## 2014-04-20 ENCOUNTER — Encounter: Payer: Self-pay | Admitting: Emergency Medicine

## 2014-04-20 VITALS — BP 114/67 | HR 82 | Temp 97.6°F | Resp 16

## 2014-04-20 DIAGNOSIS — E1165 Type 2 diabetes mellitus with hyperglycemia: Secondary | ICD-10-CM | POA: Diagnosis not present

## 2014-04-20 DIAGNOSIS — I10 Essential (primary) hypertension: Secondary | ICD-10-CM | POA: Diagnosis not present

## 2014-04-20 DIAGNOSIS — M25512 Pain in left shoulder: Secondary | ICD-10-CM | POA: Diagnosis not present

## 2014-04-20 DIAGNOSIS — IMO0002 Reserved for concepts with insufficient information to code with codable children: Secondary | ICD-10-CM

## 2014-04-20 LAB — CBC WITH DIFFERENTIAL/PLATELET
BASOS PCT: 1 % (ref 0–1)
Basophils Absolute: 0.1 10*3/uL (ref 0.0–0.1)
Eosinophils Absolute: 0.2 10*3/uL (ref 0.0–0.7)
Eosinophils Relative: 2 % (ref 0–5)
HEMATOCRIT: 34.3 % — AB (ref 36.0–46.0)
HEMOGLOBIN: 11.5 g/dL — AB (ref 12.0–15.0)
LYMPHS ABS: 2 10*3/uL (ref 0.7–4.0)
Lymphocytes Relative: 20 % (ref 12–46)
MCH: 30.3 pg (ref 26.0–34.0)
MCHC: 33.5 g/dL (ref 30.0–36.0)
MCV: 90.5 fL (ref 78.0–100.0)
MONO ABS: 0.9 10*3/uL (ref 0.1–1.0)
MPV: 9.6 fL (ref 8.6–12.4)
Monocytes Relative: 9 % (ref 3–12)
NEUTROS ABS: 6.7 10*3/uL (ref 1.7–7.7)
Neutrophils Relative %: 68 % (ref 43–77)
Platelets: 240 10*3/uL (ref 150–400)
RBC: 3.79 MIL/uL — ABNORMAL LOW (ref 3.87–5.11)
RDW: 14.2 % (ref 11.5–15.5)
WBC: 9.9 10*3/uL (ref 4.0–10.5)

## 2014-04-20 LAB — GLUCOSE, POCT (MANUAL RESULT ENTRY): POC Glucose: 145 mg/dl — AB (ref 70–99)

## 2014-04-20 LAB — BASIC METABOLIC PANEL
BUN: 60 mg/dL — ABNORMAL HIGH (ref 6–23)
CHLORIDE: 102 meq/L (ref 96–112)
CO2: 25 meq/L (ref 19–32)
Calcium: 9.9 mg/dL (ref 8.4–10.5)
Creat: 1.92 mg/dL — ABNORMAL HIGH (ref 0.50–1.10)
GLUCOSE: 134 mg/dL — AB (ref 70–99)
Potassium: 4 mEq/L (ref 3.5–5.3)
Sodium: 140 mEq/L (ref 135–145)

## 2014-04-20 LAB — POCT GLYCOSYLATED HEMOGLOBIN (HGB A1C): Hemoglobin A1C: 6.7

## 2014-04-20 MED ORDER — TRIAMCINOLONE ACETONIDE 40 MG/ML IJ SUSP
40.0000 mg | Freq: Once | INTRAMUSCULAR | Status: AC
  Administered 2014-04-20: 40 mg via INTRA_ARTICULAR

## 2014-04-20 MED ORDER — TRIAMCINOLONE ACETONIDE 40 MG/ML IJ SUSP: 40.0000 mg | Freq: Once | INTRAMUSCULAR | Status: DC

## 2014-04-20 NOTE — Progress Notes (Addendum)
Subjective:  This chart was scribed for Suzanne Russian, MD by Tamsen Roers, at [facility].  This patient was seen in room 21 and the patient's care was started at 11:14 AM.    Patient ID: Suzanne [name], female    DOB: 05-08-1921, 81 y.o.   MRN: [id number]  HPI  HPI Comments: [name] is a 81 y.o. female who presents to [facility] with a past medical history of diabetes and has had her right leg amputated due to decreased blood flow. Patient complains of having constant left shoulder pain which increases with movement. Per daughter, she notes that her mother wakes her up at night from the pain.  Patient notes that she is doing very well today and is compliant with using her oxygen at night.  Patient denies chest pain but states that she has shortness of breath when she gets up and tries to walk.  Patient is currently off of both of her blood pressure medications.     Patient Active Problem List   Diagnosis Date Noted  . Essential hypertension, benign 11/16/2013  . Peripheral vascular disease, unspecified 07/20/2013  . Discoloration of skin-Right leg 07/15/2013  . Cold feeling-Right Leg 07/15/2013  . Pain in limb-Right Leg 07/15/2013  . Atherosclerotic PVD with ulceration 04/13/2013  . Critical lower limb ischemia 04/04/2013  . Claudication 04/03/2013  . UTI (lower urinary tract infection) 01/25/2013  . Urosepsis 01/25/2013  . Long term (current) use of anticoagulants 05/12/2012  . PAD (peripheral artery disease) 03/03/2012  . Liver cyst 02/26/2012  . Cyst, kidney, acquired 02/26/2012  . Kidney stone 02/26/2012  . Chronic kidney disease, stage 3 11/19/2011  . COPD (chronic obstructive pulmonary disease) 03/18/2011  . Osteoporosis 12/17/2010  . Type II diabetes mellitus, uncontrolled   . PVD (peripheral vascular disease)   . Mixed hyperlipidemia   . Renal artery stenosis   . Memory loss, short term   . Pulmonary nodule   .  CAD (coronary artery disease)   . CHF (congestive heart failure)   . Anemia    Past Medical History  Diagnosis Date  . PVD (peripheral vascular disease)     a. PTA & stent right superficial artery PTA & left common iliac artery stenting. Right fem-pop bypass graft. b. Per Dr. Kennon Holter note: h/o aortobifemoral bypass grafting remotely as well as fem-peroneal bypass grafting.  . Mixed hyperlipidemia   . Compression fx, thoracic spine     T12  . Ischium fracture   . Memory loss, short term   . Pulmonary nodule   . Chronic diastolic CHF (congestive heart failure) 09-2008    a. EF 40% by cath 2010. b. Improved - last echo 2015 with normal EF.  [name] Anemia   . Detached retina 02-2005    R EYE  . Fracture 02-2007    L5 AND PELVIS  . Left ventricular dysfunction     a. EF 40% by cath 2010. b. Improved - last echo 2015 with normal EF.   [name] AAA (abdominal aortic aneurysm)     Repair unknown date  . Critical lower limb ischemia   . Chronic respiratory failure     a. On home O2.  [name] Hypertension   . Warfarin anticoagulation     DC summary from 2010 reports she is "on chronic  Coumadin for vascular disease." This is listed on her med list going back to 2006 by records available. Teaching service H&P remotely  says she has h/o DVT but I cannot find any evidence of this. Admission H/P 03/2013 suggests history of atrial fib but all ECGs appear sinus and there is no mention of this previously.  [name] COPD (chronic obstructive pulmonary disease)   . Type II or unspecified type diabetes mellitus without mention of complication, not stated as uncontrolled   . Complication of anesthesia     hard to wake up, bp problems   . Pneumonia     hx  . GERD (gastroesophageal reflux disease)   . Arthritis   . CAD (coronary artery disease) 10/12/2008    a. 30% LAD,30% CX & 40% tandem mid stenosis in the AV groove, 50-60% RCA  . Renal artery stenosis     a. 1-59% by doppler in 2014.; CKD stage III (Dr. Erling Cruz)  .  On home oxygen therapy     "2.5L O2 at sleep or w/increased activity" (09/27/2013)   Past Surgical History  Procedure Laterality Date  . Abdominal aortic aneurysm repair  98    "lower left ventricle"  . Aorto-femoral bypass graft Bilateral     S/P aortobifemoral bypass grafting and right fem-peroneal bypass grafting remotely  . Cholecystectomy    . Cataract extraction Right 06-2006  . Vascular surgery    . Cardiac catheterization    . Retinal detachment surgery Right     "put buckle in; it popped & they had to remove it"  . Eye surgery    . Patch angioplasty Left 04/19/2013    Procedure: VEIN PATCH ANGIOPLASTY - LEFT COMMON FEMORAL ARTERY;  Surgeon: Angelia Mould, MD;  Location: [facility];  Service: Vascular;  Laterality: Left;  . Femoral-popliteal bypass graft Left 04/19/2013    Procedure: BYPASS GRAFT FEMORAL- BELOW KNEE POPLITEAL ARTERY WITH VEIN;  Surgeon: Angelia Mould, MD;  Location: [facility];  Service: Vascular;  Laterality: Left;  . Fracture surgery      sternum  2008 no surgery  . Above knee leg amputation Right 09/27/2013  . Femoral-peroneal bypass graft    . Aorto-femoral bypass graft    . Patella fracture surgery Right ~ 1987    "shattered; work related injury"  . Total abdominal hysterectomy    . Amputation Right 09/27/2013    Procedure: AMPUTATION ABOVE KNEE-RIGHT;  Surgeon: Angelia Mould, MD;  Location: [facility];  Service: Vascular;  Laterality: Right;  . Lower extremity angiogram Left 04/04/2013    Procedure: LOWER EXTREMITY ANGIOGRAM;  Surgeon: Lorretta Harp, MD;  Location: [facility] CATH LAB;  Service: Cardiovascular;  Laterality: Left;   Allergies  Allergen Reactions  . Codeine Other (See Comments)    REACTION: hallucinations  . Hydrochlorothiazide Other (See Comments)    REACTION: hypotension  . Pregabalin Other (See Comments)    REACTION: unknown  . Other Rash    Adhesives and bandages gives pt a rash.   Prior to Admission medications   Medication  Sig Start Date End Date Taking? Authorizing Provider  acetaminophen (TYLENOL) 500 MG tablet Take 1,000 mg by mouth every 4 (four) hours as needed for moderate pain.     Historical Provider, MD  albuterol (PROVENTIL) (2.5 MG/3ML) 0.083% nebulizer solution Take 2.5 mg by nebulization 3 (three) times daily as needed for wheezing or shortness of breath.     Historical Provider, MD  Alcohol Swabs (ALCOHOL PREP) 70 % PADS To use before injecting lantus 12/16/13   Suzanne Russian, MD  aspirin EC 325 MG EC tablet Take 1 tablet (  325 mg total) by mouth daily. 04/05/13   Dayna N Dunn, PA-C  budesonide-formoterol (SYMBICORT) 160-4.5 MCG/ACT inhaler Inhale 2 puffs into the lungs 2 (two) times daily. 01/31/14   Suzanne Russian, MD  Calcium Carbonate-Vitamin D (CALCIUM 600+D) 600-400 MG-UNIT per tablet Use bid 12/16/13   Suzanne Russian, MD  esomeprazole (NEXIUM) 40 MG capsule Take 1 capsule (40 mg total) by mouth daily before breakfast. 12/16/13   Suzanne Russian, MD  ferrous sulfate 325 (65 FE) MG tablet Take 1 tablet (325 mg total) by mouth daily with breakfast. 12/16/13   Suzanne Russian, MD  furosemide (LASIX) 40 MG tablet Take 1 tablet (40 mg total) by mouth daily. 12/16/13   Suzanne Russian, MD  Insulin Glargine (LANTUS SOLOSTAR) 100 UNIT/ML Solostar Pen INJECT 12 UNITS UNDER THE SKIN DAILY 03/14/14   Mancel Bale, PA-C  Insulin Pen Needle (PEN NEEDLES 3/16") 31G X 5 MM MISC To use once daily with LANTUS pens 12/16/13   Suzanne Russian, MD  Ipratropium-Albuterol (COMBIVENT RESPIMAT) 20-100 MCG/ACT AERS respimat Inhale 2 puffs into the lungs every 6 (six) hours as needed for wheezing or shortness of breath. 12/16/13   Suzanne Russian, MD  lidocaine (LIDODERM) 5 % Place 1 patch onto the skin daily. Remove & Discard patch within 12 hours or as directed by MD    Historical Provider, MD  Omega-3 Fatty Acids (FISH OIL) 1200 MG CAPS Take 1 capsule (1,200 mg total) by mouth 2 (two) times daily. 12/16/13   Suzanne Russian, MD    ranitidine (ZANTAC) 150 MG tablet Take 1 tablet (150 mg total) by mouth 2 (two) times daily. 12/16/13   Suzanne Russian, MD  rosuvastatin (CRESTOR) 20 MG tablet Take 1 tablet (20 mg total) by mouth daily. 04/04/14   Suzanne Russian, MD   History   Social History  . Marital Status: Widowed    Spouse Name: N/A  . Number of Children: N/A  . Years of Education: N/A   Occupational History  . Not on file.   Social History Main Topics  . Smoking status: Former Smoker -- 2.00 packs/day for 40 years    Types: Cigarettes    Quit date: 02/16/1997  . Smokeless tobacco: Never Used  . Alcohol Use: No  . Drug Use: No  . Sexual Activity: No   Other Topics Concern  . Not on file   Social History Narrative   Lives by self, cares for self, drives.      Review of Systems  Constitutional: Negative for fever and chills.  HENT: Negative for congestion.   Respiratory: Positive for shortness of breath.   Cardiovascular: Negative for chest pain.  Musculoskeletal: Positive for myalgias.       Objective:   Physical Exam  CONSTITUTIONAL: Well developed/well nourished, She is alert cooperative HEAD: Normocephalic/atraumatic EYES: EOMI/PERRL ENMT: Mucous membranes moist NECK: supple no meningeal signs SPINE/BACK:entire spine nontender CV: S1/S2 noted, no murmurs/rubs/gallops noted LUNGS: dry rales in both bases  ABDOMEN: soft, nontender, no rebound or guarding, bowel sounds noted throughout abdomen GU:no cva tenderness NEURO: Pt is awake/alert/appropriate, moves all extremitiesx4.  No facial droop.   EXTREMITIES: healed ulcer over the tip of the left great toe , Right BK amputation.  Left shoulder: tenderness in the subdeltoid area with pain on abduction.  SKIN: warm, color normal PSYCH: no abnormalities of mood noted, alert and oriented to situation   Filed Vitals:   04/20/14 1110  BP: 114/67  Pulse: 82  Temp: 97.6 F (36.4 C)  TempSrc: Oral  Resp: 16  SpO2: 94%   Results for  orders placed or performed in visit on 04/20/14  POCT glucose (manual entry)  Result Value Ref Range   POC Glucose 145 (A) 70 - 99 mg/dl  POCT glycosylated hemoglobin (Hb A1C)  Result Value Ref Range   Hemoglobin A1C 6.7    PROCEDURE NOTE : The left subdeltoid area was prepped with Betadine 2 cleaned with alcohol swab. No more 1 mL of 2% plain. This area was injected with 40 of Kenalog along with 2 mL of 2% plain. Patient tolerated well       Assessment & Plan:  Patient has a bursitis in her left shoulder which was injected. She is doing great status post amputation of her right leg. A1c has risen to 6.7. No changes made in her insulin dosage at present.I personally performed the services described in this documentation, which was scribed in my presence. The recorded information has been reviewed and is accurate.

## 2014-04-26 ENCOUNTER — Ambulatory Visit: Payer: Medicare Other | Admitting: Physical Therapy

## 2014-04-26 ENCOUNTER — Encounter: Payer: Self-pay | Admitting: Physical Therapy

## 2014-04-26 DIAGNOSIS — Z89611 Acquired absence of right leg above knee: Secondary | ICD-10-CM

## 2014-04-26 DIAGNOSIS — R531 Weakness: Secondary | ICD-10-CM

## 2014-04-26 DIAGNOSIS — R6889 Other general symptoms and signs: Secondary | ICD-10-CM

## 2014-04-26 DIAGNOSIS — R269 Unspecified abnormalities of gait and mobility: Secondary | ICD-10-CM | POA: Diagnosis not present

## 2014-04-26 DIAGNOSIS — R2689 Other abnormalities of gait and mobility: Secondary | ICD-10-CM

## 2014-04-26 NOTE — Patient Instructions (Signed)
   ONLY DO IN STANDING WITH SOMEONE WITH YOU  WHEN ALONE SIT AT EDGE OF CHAIR WITH NO BACK SUPPORT OR ARM SUPPORT  Feet Apart, Head Motion - Eyes Open   With eyes open, feet apart, move head slowly: up and down, right / left and diagonals (up-right to down-left and then up-left to down-right). Repeat __10-15__ times each direction per session. Do _1-2___ sessions per day.  Copyright  VHI. All rights reserved.  Feet Apart, Head Motion - Eyes Closed   With eyes closed and feet shoulder width apart, move head slowly, up and down, right / left and diagonals (up-right to down-left and then up-left to down-right). Repeat __10-15__ times per session. Do __1-2__ sessions per day.  Copyright  VHI. All rights reserved.

## 2014-04-27 NOTE — Therapy (Signed)
[facility] [address] [place], [place], [address] Phone: [phone]   Fax:  [phone]  Physical Therapy Treatment  Patient Details  Name: Suzanne [name] MRN: [id number] Date of Birth: [birth year] Referring Provider:  Darlyne Russian, MD  Encounter Date: 04/26/2014      PT End of Session - 04/26/14 1445    Visit Number 7   Number of Visits 17   Date for PT Re-Evaluation 05/12/14   PT Start Time 7425   PT Stop Time 1530   PT Time Calculation (min) 45 min   Equipment Utilized During Treatment Gait belt   Activity Tolerance Patient tolerated treatment well   Behavior During Therapy [facility] for tasks assessed/performed      Past Medical History  Diagnosis Date  . PVD (peripheral vascular disease)     a. PTA & stent right superficial artery PTA & left common iliac artery stenting. Right fem-pop bypass graft. b. Per Dr. Kennon Holter note: h/o aortobifemoral bypass grafting remotely as well as fem-peroneal bypass grafting.  . Mixed hyperlipidemia   . Compression fx, thoracic spine     T12  . Ischium fracture   . Memory loss, short term   . Pulmonary nodule   . Chronic diastolic CHF (congestive heart failure) 09-2008    a. EF 40% by cath 2010. b. Improved - last echo 2015 with normal EF.  [name] Anemia   . Detached retina 02-2005    R EYE  . Fracture 02-2007    L5 AND PELVIS  . Left ventricular dysfunction     a. EF 40% by cath 2010. b. Improved - last echo 2015 with normal EF.   [name] AAA (abdominal aortic aneurysm)     Repair unknown date  . Critical lower limb ischemia   . Chronic respiratory failure     a. On home O2.  [name] Hypertension   . Warfarin anticoagulation     DC summary from 2010 reports she is "on chronic  Coumadin for vascular disease." This is listed on her med list going back to 2006 by records available. Teaching service H&P remotely says she has h/o DVT but I cannot find any evidence of this. Admission H/P  03/2013 suggests history of atrial fib but all ECGs appear sinus and there is no mention of this previously.  [name] COPD (chronic obstructive pulmonary disease)   . Type II or unspecified type diabetes mellitus without mention of complication, not stated as uncontrolled   . Complication of anesthesia     hard to wake up, bp problems   . Pneumonia     hx  . GERD (gastroesophageal reflux disease)   . Arthritis   . CAD (coronary artery disease) 10/12/2008    a. 30% LAD,30% CX & 40% tandem mid stenosis in the AV groove, 50-60% RCA  . Renal artery stenosis     a. 1-59% by doppler in 2014.; CKD stage III (Dr. Erling Cruz)  . On home oxygen therapy     "2.5L O2 at sleep or w/increased activity" (09/27/2013)    Past Surgical History  Procedure Laterality Date  . Abdominal aortic aneurysm repair  98    "lower left ventricle"  . Aorto-femoral bypass graft Bilateral     S/P aortobifemoral bypass grafting and right fem-peroneal bypass grafting remotely  . Cholecystectomy    . Cataract extraction Right 06-2006  . Vascular surgery    . Cardiac catheterization    . Retinal detachment surgery Right     "  put buckle in; it popped & they had to remove it"  . Eye surgery    . Patch angioplasty Left 04/19/2013    Procedure: VEIN PATCH ANGIOPLASTY - LEFT COMMON FEMORAL ARTERY;  Surgeon: Angelia Mould, MD;  Location: [facility];  Service: Vascular;  Laterality: Left;  . Femoral-popliteal bypass graft Left 04/19/2013    Procedure: BYPASS GRAFT FEMORAL- BELOW KNEE POPLITEAL ARTERY WITH VEIN;  Surgeon: Angelia Mould, MD;  Location: [facility];  Service: Vascular;  Laterality: Left;  . Fracture surgery      sternum  2008 no surgery  . Above knee leg amputation Right 09/27/2013  . Femoral-peroneal bypass graft    . Aorto-femoral bypass graft    . Patella fracture surgery Right ~ 1987    "shattered; work related injury"  . Total abdominal hysterectomy    . Amputation Right 09/27/2013    Procedure: AMPUTATION  ABOVE KNEE-RIGHT;  Surgeon: Angelia Mould, MD;  Location: [facility];  Service: Vascular;  Laterality: Right;  . Lower extremity angiogram Left 04/04/2013    Procedure: LOWER EXTREMITY ANGIOGRAM;  Surgeon: Lorretta Harp, MD;  Location: [facility] C[facility] LAB;  Service: Cardiovascular;  Laterality: Left;    There were no vitals taken for this visit.  Visit Diagnosis:  Activity intolerance  Weakness generalized  Balance problem  Status post above knee amputation of right lower extremity      Subjective Assessment - 04/26/14 1518    Symptoms Dr said she has bursitis in left shoulder and gave her a cortisone shot on 04/20/14. Shoulder too painful to use to push up or do much.   Currently in Pain? Yes   Pain Score 6    Pain Location Shoulder   Pain Orientation Left   Pain Descriptors / Indicators Sharp   Aggravating Factors  pushing or pulling with left arm   Pain Relieving Factors medication and rest     Prosthetic Training: Patient's daughter had 2 videos taken of patient trying to sit and stand from recliner in her room. See clinical impression under plan section below. PT demo / instructed to try turning towards front left corner so left UE not so posterior. Patient and daughter verbalized understanding and will try. NeuroMuscular Re-ed: Standing in parallel bars with mirror for feedback balance activities: with single UE support requires SBA and with light UE on PTs waist when standing anterior requires min Guard. Head movements side to side, up/down and diagonals with eyes open and closed. Repeated in sitting without back or arm support. Reaching forward and down. Trunk rotation.                       PT Education - 04/26/14 1445    Education provided Yes   Education Details vestibular exercises seated without back support if alone and standing with family / caregiver only   Person(s) Educated Patient;Child(ren)   Methods Explanation;Demonstration;Handout    Comprehension Verbalized understanding;Returned demonstration          PT Short Term Goals - 04/14/14 1655    PT SHORT TERM GOAL #1   Title wears prostheis >/= 4 days /wk for >/= 50% of awake hours. (Target Date: 04/14/14)   Baseline MET 04/14/14 as reports wear total of 6 hours daily over last week and out of bed ~10-12 hours.   Time 4   Period Weeks   Status Achieved   PT SHORT TERM GOAL #2   Title reaches 5" anteriorly & manages  pants for toileting in standing with rolling walker safely with no balance loss.  (Target Date: 04/14/14)   Baseline MET 04/14/14 patient reaches 5" anteriorly with RW support and reports managing pants while standing.   Time 4   Period Weeks   Status Partially Met   PT SHORT TERM GOAL #3   Title ambulates 67' with rolling walker & prosthesis with supervision.  (NEW Target Date: 05/12/14)   Baseline NOT MET 04/14/14 Patient ambulates 94' with RW & prosthesis with minimal gaurd /contact assist.    Time 4   Period Weeks   Status Revised           PT Long Term Goals - 04/14/14 1659    PT LONG TERM GOAL #1   Title Patient wears prosthesis daily >66% of awake hours without pain or skin issues.  (Target Date: 05/12/14)   Time 8   Period Weeks   Status On-going   PT LONG TERM GOAL #2   Title verbalizes proper prosthetic care with daughter's verbal assistance as needed  (Target Date: 05/12/14)   Time 8   Period Weeks   Status On-going   PT LONG TERM GOAL #3   Title ambulates 20' with rolling walker & prosthesis modified independent.  (Target Date: 05/12/14)   Time 8   Period Weeks   Status Revised   PT LONG TERM GOAL #4   Title negotiates ramp & curb with rolling walker & prosthesis with daughter minimal assist safely  (Target Date: 05/12/14)   Time 8   Period Weeks   Status On-going   PT LONG TERM GOAL #5   Title reaches into upper / lower cabinets & manages clothes in standing with walker /counter support modified independent.  (Target Date: 05/12/14)    Time 8   Period Weeks   Status On-going   PT LONG TERM GOAL #6   Title Functional Status self-report on FOTO improves by 10 points.  (Target Date: 05/12/14)   Time 8   Period Weeks   Status On-going               Plan - 04/26/14 1445    Clinical Impression Statement Patient's recliner appears to have 3 issues that make it difficult for her to get out. 1) seat is inclined back 2) armrests are posterior to edge of seat making her place arms behind her to push up. This could be a factor in her shoulder bursitis. 3) foot plate prevents left knee flexion to get her foot under her to stand up.                       Pt will benefit from skilled therapeutic intervention in order to improve on the following deficits Abnormal gait;Decreased activity tolerance;Decreased balance;Decreased endurance;Decreased knowledge of use of DME;Decreased mobility;Decreased strength   Rehab Potential Good   PT Frequency 2x / week   PT Duration 8 weeks   PT Treatment/Interventions ADLs/Self Care Home Management;DME Instruction;Gait training;Stair training;Functional mobility training;Therapeutic activities;Therapeutic exercise;Balance training;Neuromuscular re-education;Patient/family education;Other (comment)  prosthetic training   PT Next Visit Plan gait with rolling walker, balance activities   Consulted and Agree with Plan of Care Patient;Family member/caregiver   Family Member Consulted daughter        Problem List Patient Active Problem List   Diagnosis Date Noted  . Essential hypertension, benign 11/16/2013  . Peripheral vascular disease, unspecified 07/20/2013  . Discoloration of skin-Right leg 07/15/2013  . Cold feeling-Right Leg 07/15/2013  .  Pain in limb-Right Leg 07/15/2013  . Atherosclerotic PVD with ulceration 04/13/2013  . Critical lower limb ischemia 04/04/2013  . Claudication 04/03/2013  . UTI (lower urinary tract infection) 01/25/2013  . Urosepsis 01/25/2013  . Long term  (current) use of anticoagulants 05/12/2012  . PAD (peripheral artery disease) 03/03/2012  . Liver cyst 02/26/2012  . Cyst, kidney, acquired 02/26/2012  . Kidney stone 02/26/2012  . Chronic kidney disease, stage 3 11/19/2011  . COPD (chronic obstructive pulmonary disease) 03/18/2011  . Osteoporosis 12/17/2010  . Type II diabetes mellitus, uncontrolled   . PVD (peripheral vascular disease)   . Mixed hyperlipidemia   . Renal artery stenosis   . Memory loss, short term   . Pulmonary nodule   . CAD (coronary artery disease)   . CHF (congestive heart failure)   . Anemia     Jasamine Pottinger PT, DPT 04/27/2014, 8:08 AM  [facility] [address] [place], [place], [address] Phone: [phone]   Fax:  [phone]

## 2014-05-02 ENCOUNTER — Encounter: Payer: Medicare Other | Admitting: Physical Therapy

## 2014-05-03 ENCOUNTER — Encounter: Payer: Self-pay | Admitting: Physical Therapy

## 2014-05-03 ENCOUNTER — Ambulatory Visit: Payer: Medicare Other | Admitting: Physical Therapy

## 2014-05-03 DIAGNOSIS — Z89611 Acquired absence of right leg above knee: Secondary | ICD-10-CM

## 2014-05-03 DIAGNOSIS — R269 Unspecified abnormalities of gait and mobility: Secondary | ICD-10-CM

## 2014-05-03 DIAGNOSIS — R2689 Other abnormalities of gait and mobility: Secondary | ICD-10-CM

## 2014-05-03 DIAGNOSIS — R531 Weakness: Secondary | ICD-10-CM

## 2014-05-04 NOTE — Therapy (Signed)
[facility] [address] [place], [place], [address] Phone: [phone]   Fax:  [phone]  Physical Therapy Treatment  Patient Details  Name: Suzanne [name] MRN: [id number] Date of Birth: [birth year] Referring Provider:  Darlyne Russian, MD  Encounter Date: 05/03/2014      PT End of Session - 05/03/14 1530    Visit Number 8   Number of Visits 17   Date for PT Re-Evaluation 05/12/14   PT Start Time 0867   PT Stop Time 1615   PT Time Calculation (min) 45 min   Equipment Utilized During Treatment Gait belt   Activity Tolerance Patient tolerated treatment well   Behavior During Therapy [facility] for tasks assessed/performed      Past Medical History  Diagnosis Date  . PVD (peripheral vascular disease)     a. PTA & stent right superficial artery PTA & left common iliac artery stenting. Right fem-pop bypass graft. b. Per Dr. Kennon Holter note: h/o aortobifemoral bypass grafting remotely as well as fem-peroneal bypass grafting.  . Mixed hyperlipidemia   . Compression fx, thoracic spine     T12  . Ischium fracture   . Memory loss, short term   . Pulmonary nodule   . Chronic diastolic CHF (congestive heart failure) 09-2008    a. EF 40% by cath 2010. b. Improved - last echo 2015 with normal EF.  [name] Anemia   . Detached retina 02-2005    R EYE  . Fracture 02-2007    L5 AND PELVIS  . Left ventricular dysfunction     a. EF 40% by cath 2010. b. Improved - last echo 2015 with normal EF.   [name] AAA (abdominal aortic aneurysm)     Repair unknown date  . Critical lower limb ischemia   . Chronic respiratory failure     a. On home O2.  [name] Hypertension   . Warfarin anticoagulation     DC summary from 2010 reports she is "on chronic  Coumadin for vascular disease." This is listed on her med list going back to 2006 by records available. Teaching service H&P remotely says she has h/o DVT but I cannot find any evidence of this. Admission H/P  03/2013 suggests history of atrial fib but all ECGs appear sinus and there is no mention of this previously.  [name] COPD (chronic obstructive pulmonary disease)   . Type II or unspecified type diabetes mellitus without mention of complication, not stated as uncontrolled   . Complication of anesthesia     hard to wake up, bp problems   . Pneumonia     hx  . GERD (gastroesophageal reflux disease)   . Arthritis   . CAD (coronary artery disease) 10/12/2008    a. 30% LAD,30% CX & 40% tandem mid stenosis in the AV groove, 50-60% RCA  . Renal artery stenosis     a. 1-59% by doppler in 2014.; CKD stage III (Dr. Erling Cruz)  . On home oxygen therapy     "2.5L O2 at sleep or w/increased activity" (09/27/2013)    Past Surgical History  Procedure Laterality Date  . Abdominal aortic aneurysm repair  98    "lower left ventricle"  . Aorto-femoral bypass graft Bilateral     S/P aortobifemoral bypass grafting and right fem-peroneal bypass grafting remotely  . Cholecystectomy    . Cataract extraction Right 06-2006  . Vascular surgery    . Cardiac catheterization    . Retinal detachment surgery Right     "  put buckle in; it popped & they had to remove it"  . Eye surgery    . Patch angioplasty Left 04/19/2013    Procedure: VEIN PATCH ANGIOPLASTY - LEFT COMMON FEMORAL ARTERY;  Surgeon: Angelia Mould, MD;  Location: [facility];  Service: Vascular;  Laterality: Left;  . Femoral-popliteal bypass graft Left 04/19/2013    Procedure: BYPASS GRAFT FEMORAL- BELOW KNEE POPLITEAL ARTERY WITH VEIN;  Surgeon: Angelia Mould, MD;  Location: [facility];  Service: Vascular;  Laterality: Left;  . Fracture surgery      sternum  2008 no surgery  . Above knee leg amputation Right 09/27/2013  . Femoral-peroneal bypass graft    . Aorto-femoral bypass graft    . Patella fracture surgery Right ~ 1987    "shattered; work related injury"  . Total abdominal hysterectomy    . Amputation Right 09/27/2013    Procedure: AMPUTATION  ABOVE KNEE-RIGHT;  Surgeon: Angelia Mould, MD;  Location: [facility];  Service: Vascular;  Laterality: Right;  . Lower extremity angiogram Left 04/04/2013    Procedure: LOWER EXTREMITY ANGIOGRAM;  Surgeon: Lorretta Harp, MD;  Location: [facility] CATH LAB;  Service: Cardiovascular;  Laterality: Left;    There were no vitals filed for this visit.  Visit Diagnosis:  Abnormality of gait  Balance problem  Status post above knee amputation of right lower extremity  Weakness generalized      Subjective Assessment - 05/03/14 1539    Symptoms (p) worn prosthesis 5 of 7 days for 4 hours only 4 days and 2x 1 day. She has been resting her left shoulder.   Currently in Pain? (p) Yes   Pain Score (p) 2    Pain Location (p) Shoulder   Pain Orientation (p) Left   Pain Descriptors / Indicators (p) Sore   Aggravating Factors  (p) touching area   Multiple Pain Sites (p) No                       [facility] Adult PT Treatment/Exercise - 05/03/14 1530    Transfers   Transfers Sit to Stand;Stand to Sit   Sit to Stand 5: Supervision;With upper extremity assist;With armrests;From chair/3-in-1  to RW for stabilization   Sit to Stand Details (indicate cue type and reason) minimal cues on technique   Stand to Sit 5: Supervision;With upper extremity assist;With armrests;To chair/3-in-1  from RW    Stand to Sit Details minimal cues on technique   Ambulation/Gait   Ambulation/Gait Yes   Ambulation/Gait Assistance 4: Min guard;5: Supervision   Ambulation/Gait Assistance Details cues on initial contact to stabilize prosthetic knee   Ambulation Distance (Feet) 30 Feet  4 reps   Assistive device Rolling walker;Prosthesis   Gait Pattern Step-to pattern;Decreased step length - left;Decreased stance time - right;Decreased stride length;Decreased hip/knee flexion - right;Decreased weight shift to right;Right hip hike;Lateral hip instability;Trunk flexed;Abducted- right;Poor foot clearance - right    Ambulation Surface Level;Indoor   Gait velocity --   Gait Comments --  104 is 80% of max HR of 130 for her age.   Posture/Postural Control   Posture/Postural Control Postural limitations   Postural Limitations Rounded Shoulders;Forward head;Increased thoracic kyphosis;Flexed trunk;Weight shift left   Prosthetics   Prosthetic Care Comments  Donned with air pocket distally   Current prosthetic wear tolerance (days/week)  5 days over last week to allow left shoulder to heal   Current prosthetic wear tolerance (#hours/day)  4 hours  1x /day except  1 day did do 2x/day   Current prosthetic weight-bearing tolerance (hours/day)  tolerated 3 minutes of standing with RW without pain   Residual limb condition  no open areas but fragile skin   Education Provided Proper wear schedule/adjustment   Person(s) Educated Patient;Child(ren)   Education Method Explanation   Education Method Verbalized understanding                  PT Short Term Goals - 04/14/14 1655    PT SHORT TERM GOAL #1   Title wears prostheis >/= 4 days /wk for >/= 50% of awake hours. (Target Date: 04/14/14)   Baseline MET 04/14/14 as reports wear total of 6 hours daily over last week and out of bed ~10-12 hours.   Time 4   Period Weeks   Status Achieved   PT SHORT TERM GOAL #2   Title reaches 5" anteriorly & manages pants for toileting in standing with rolling walker safely with no balance loss.  (Target Date: 04/14/14)   Baseline MET 04/14/14 patient reaches 5" anteriorly with RW support and reports managing pants while standing.   Time 4   Period Weeks   Status Partially Met   PT SHORT TERM GOAL #3   Title ambulates 39' with rolling walker & prosthesis with supervision.  (NEW Target Date: 05/12/14)   Baseline NOT MET 04/14/14 Patient ambulates 31' with RW & prosthesis with minimal gaurd /contact assist.    Time 4   Period Weeks   Status Revised           PT Long Term Goals - 04/14/14 1659    PT LONG TERM GOAL  #1   Title Patient wears prosthesis daily >66% of awake hours without pain or skin issues.  (Target Date: 05/12/14)   Time 8   Period Weeks   Status On-going   PT LONG TERM GOAL #2   Title verbalizes proper prosthetic care with daughter's verbal assistance as needed  (Target Date: 05/12/14)   Time 8   Period Weeks   Status On-going   PT LONG TERM GOAL #3   Title ambulates 20' with rolling walker & prosthesis modified independent.  (Target Date: 05/12/14)   Time 8   Period Weeks   Status Revised   PT LONG TERM GOAL #4   Title negotiates ramp & curb with rolling walker & prosthesis with daughter minimal assist safely  (Target Date: 05/12/14)   Time 8   Period Weeks   Status On-going   PT LONG TERM GOAL #5   Title reaches into upper / lower cabinets & manages clothes in standing with walker /counter support modified independent.  (Target Date: 05/12/14)   Time 8   Period Weeks   Status On-going   PT LONG TERM GOAL #6   Title Functional Status self-report on FOTO improves by 10 points.  (Target Date: 05/12/14)   Time 8   Period Weeks   Status On-going               Plan - 05/03/14 1530    Clinical Impression Statement Patient was able to stand, sit and ambulate better with repeatedly reading & envisioning technique at home as advised by PT.    Pt will benefit from skilled therapeutic intervention in order to improve on the following deficits Abnormal gait;Decreased activity tolerance;Decreased balance;Decreased endurance;Decreased knowledge of use of DME;Decreased mobility;Decreased strength   Rehab Potential Good   PT Frequency 2x / week   PT Duration 8 weeks  PT Treatment/Interventions ADLs/Self Care Home Management;DME Instruction;Gait training;Stair training;Functional mobility training;Therapeutic activities;Therapeutic exercise;Balance training;Neuromuscular re-education;Patient/family education;Other (comment)  prosthetic training   PT Next Visit Plan assess LTGs,  gait  with rolling walker, balance activities   Consulted and Agree with Plan of Care Patient;Family member/caregiver   Family Member Consulted daughter        Problem List Patient Active Problem List   Diagnosis Date Noted  . Essential hypertension, benign 11/16/2013  . Peripheral vascular disease, unspecified 07/20/2013  . Discoloration of skin-Right leg 07/15/2013  . Cold feeling-Right Leg 07/15/2013  . Pain in limb-Right Leg 07/15/2013  . Atherosclerotic PVD with ulceration 04/13/2013  . Critical lower limb ischemia 04/04/2013  . Claudication 04/03/2013  . UTI (lower urinary tract infection) 01/25/2013  . Urosepsis 01/25/2013  . Long term (current) use of anticoagulants 05/12/2012  . PAD (peripheral artery disease) 03/03/2012  . Liver cyst 02/26/2012  . Cyst, kidney, acquired 02/26/2012  . Kidney stone 02/26/2012  . Chronic kidney disease, stage 3 11/19/2011  . COPD (chronic obstructive pulmonary disease) 03/18/2011  . Osteoporosis 12/17/2010  . Type II diabetes mellitus, uncontrolled   . PVD (peripheral vascular disease)   . Mixed hyperlipidemia   . Renal artery stenosis   . Memory loss, short term   . Pulmonary nodule   . CAD (coronary artery disease)   . CHF (congestive heart failure)   . Anemia     Marissa Weaver PT, DPT 05/04/2014, 12:58 PM  [facility] [address] [place], [place], [address] Phone: [phone]   Fax:  [phone]

## 2014-05-11 ENCOUNTER — Encounter: Payer: Self-pay | Admitting: Physical Therapy

## 2014-05-11 ENCOUNTER — Ambulatory Visit: Payer: Medicare Other | Admitting: Physical Therapy

## 2014-05-11 DIAGNOSIS — R2689 Other abnormalities of gait and mobility: Secondary | ICD-10-CM

## 2014-05-11 DIAGNOSIS — R269 Unspecified abnormalities of gait and mobility: Secondary | ICD-10-CM | POA: Diagnosis not present

## 2014-05-11 DIAGNOSIS — R531 Weakness: Secondary | ICD-10-CM

## 2014-05-11 DIAGNOSIS — Z89611 Acquired absence of right leg above knee: Secondary | ICD-10-CM

## 2014-05-11 DIAGNOSIS — R6889 Other general symptoms and signs: Secondary | ICD-10-CM

## 2014-05-12 NOTE — Therapy (Signed)
[facility] [address] [place], [place], [address] Phone: [phone]   Fax:  [phone]  Physical Therapy Treatment  Patient Details  Name: Suzanne [name] MRN: [id number] Date of Birth: [birth year] Referring Provider:  Darlyne Russian, MD  Encounter Date: 05/11/2014      PT End of Session - 05/11/14 1530    Visit Number 9   Number of Visits 17   Date for PT Re-Evaluation 05/12/14   PT Start Time 1324   PT Stop Time 1625   PT Time Calculation (min) 55 min   Equipment Utilized During Treatment Gait belt   Activity Tolerance Patient tolerated treatment well   Behavior During Therapy [facility] for tasks assessed/performed      Past Medical History  Diagnosis Date  . PVD (peripheral vascular disease)     a. PTA & stent right superficial artery PTA & left common iliac artery stenting. Right fem-pop bypass graft. b. Per Dr. Kennon Holter note: h/o aortobifemoral bypass grafting remotely as well as fem-peroneal bypass grafting.  . Mixed hyperlipidemia   . Compression fx, thoracic spine     T12  . Ischium fracture   . Memory loss, short term   . Pulmonary nodule   . Chronic diastolic CHF (congestive heart failure) 09-2008    a. EF 40% by cath 2010. b. Improved - last echo 2015 with normal EF.  [name] Anemia   . Detached retina 02-2005    R EYE  . Fracture 02-2007    L5 AND PELVIS  . Left ventricular dysfunction     a. EF 40% by cath 2010. b. Improved - last echo 2015 with normal EF.   [name] AAA (abdominal aortic aneurysm)     Repair unknown date  . Critical lower limb ischemia   . Chronic respiratory failure     a. On home O2.  [name] Hypertension   . Warfarin anticoagulation     DC summary from 2010 reports she is "on chronic  Coumadin for vascular disease." This is listed on her med list going back to 2006 by records available. Teaching service H&P remotely says she has h/o DVT but I cannot find any evidence of this. Admission H/P  03/2013 suggests history of atrial fib but all ECGs appear sinus and there is no mention of this previously.  [name] COPD (chronic obstructive pulmonary disease)   . Type II or unspecified type diabetes mellitus without mention of complication, not stated as uncontrolled   . Complication of anesthesia     hard to wake up, bp problems   . Pneumonia     hx  . GERD (gastroesophageal reflux disease)   . Arthritis   . CAD (coronary artery disease) 10/12/2008    a. 30% LAD,30% CX & 40% tandem mid stenosis in the AV groove, 50-60% RCA  . Renal artery stenosis     a. 1-59% by doppler in 2014.; CKD stage III (Dr. Erling Cruz)  . On home oxygen therapy     "2.5L O2 at sleep or w/increased activity" (09/27/2013)    Past Surgical History  Procedure Laterality Date  . Abdominal aortic aneurysm repair  98    "lower left ventricle"  . Aorto-femoral bypass graft Bilateral     S/P aortobifemoral bypass grafting and right fem-peroneal bypass grafting remotely  . Cholecystectomy    . Cataract extraction Right 06-2006  . Vascular surgery    . Cardiac catheterization    . Retinal detachment surgery Right     "  put buckle in; it popped & they had to remove it"  . Eye surgery    . Patch angioplasty Left 04/19/2013    Procedure: VEIN PATCH ANGIOPLASTY - LEFT COMMON FEMORAL ARTERY;  Surgeon: Angelia Mould, MD;  Location: [place];  Service: Vascular;  Laterality: Left;  . Femoral-popliteal bypass graft Left 04/19/2013    Procedure: BYPASS GRAFT FEMORAL- BELOW KNEE POPLITEAL ARTERY WITH VEIN;  Surgeon: Angelia Mould, MD;  Location: [place];  Service: Vascular;  Laterality: Left;  . Fracture surgery      sternum  2008 no surgery  . Above knee leg amputation Right 09/27/2013  . Femoral-peroneal bypass graft    . Aorto-femoral bypass graft    . Patella fracture surgery Right ~ 1987    "shattered; work related injury"  . Total abdominal hysterectomy    . Amputation Right 09/27/2013    Procedure: AMPUTATION  ABOVE KNEE-RIGHT;  Surgeon: Angelia Mould, MD;  Location: [place];  Service: Vascular;  Laterality: Right;  . Lower extremity angiogram Left 04/04/2013    Procedure: LOWER EXTREMITY ANGIOGRAM;  Surgeon: Lorretta Harp, MD;  Location: [facility] CATH LAB;  Service: Cardiovascular;  Laterality: Left;    There were no vitals filed for this visit.  Visit Diagnosis:  Abnormality of gait  Balance problem  Status post above knee amputation of right lower extremity  Weakness generalized  Activity intolerance      Subjective Assessment - 05/11/14 1542    Symptoms Wore prosthesis daily from time she dresses (1-2 hours after arising) until she gets ready for bed.   Currently in Pain? No/denies                       [facility] Adult PT Treatment/Exercise - 05/11/14 1530    Transfers   Transfers Sit to Stand;Stand to Sit   Sit to Stand 5: Supervision;With upper extremity assist;With armrests;From chair/3-in-1  to RW for stabilization   Sit to Stand Details (indicate cue type and reason) min cues on prosthesis control   Stand to Sit 5: Supervision;With upper extremity assist;With armrests;To chair/3-in-1  from RW    Stand to Sit Details min cues on prosthetic control   Ambulation/Gait   Ambulation/Gait Yes   Ambulation/Gait Assistance 5: Supervision;4: Min assist;3: Mod assist  SBA straight path, MinA turns, ModA to back up to position   Ambulation/Gait Assistance Details tactile / verbal cues on pelvic motion for turns & ambulating backwards to position to sit   Ambulation Distance (Feet) 25 Feet  2 reps   Assistive device Rolling walker;Prosthesis   Gait Pattern Step-to pattern;Decreased step length - left;Decreased stance time - right;Decreased stride length;Decreased hip/knee flexion - right;Decreased weight shift to right;Right hip hike;Lateral hip instability;Trunk flexed;Abducted- right;Poor foot clearance - right   Ambulation Surface Level;Indoor   Gait velocity -  backwards ModA   Gait Comments SpO2 93%, HR 104, Dyspnea 3/4 after 1st gait, SpO2 87% after 2nd gait  104 is 80% of max HR of 130 for her age.   Posture/Postural Control   Posture/Postural Control --   Postural Limitations --   Static Standing Balance   Static Standing - Balance Support Bilateral upper extremity supported;No upper extremity supported   Static Standing - Level of Assistance 5: Stand by assistance;4: Min assist  SBA BUE on RW, MinA without UE support   Static Standing - Comment/# of Minutes 2 minutes with BUE, 5 sec without UE support   Dynamic Standing  Balance   Dynamic Standing - Balance Support Right upper extremity supported;During functional activity  RW support   Dynamic Standing - Level of Assistance 5: Stand by assistance   Dynamic Standing - Balance Activities Reaching for objects   Prosthetics   Prosthetic Care Comments  --   Current prosthetic wear tolerance (days/week)  daily 7 days /wk   Current prosthetic wear tolerance (#hours/day)  4-8 hours  reports wears 4 hours up to dressing (1-2 hrs) to bed   Current prosthetic weight-bearing tolerance (hours/day)  tolerated 3 minutes of standing with RW without pain   Residual limb condition  no open areas but fragile skin   Education Provided Proper wear schedule/adjustment;Correct ply sock adjustment   Person(s) Educated Patient;Child(ren)   Education Method Explanation   Education Method Verbalized understanding                PT Education - 05/11/14 1530    Education provided Yes   Education Details Patient questioning walking on ramp to outside area and 4" threshold to patio, PT instructed in safety & cardio concerns vs benefits - recommended w/c to access, toileting with prosthesis   Person(s) Educated Patient;Parent(s)   Methods Explanation;Demonstration   Comprehension Verbalized understanding;Need further instruction          PT Short Term Goals - 04/14/14 1655    PT SHORT TERM GOAL #1    Title wears prostheis >/= 4 days /wk for >/= 50% of awake hours. (Target Date: 04/14/14)   Baseline MET 04/14/14 as reports wear total of 6 hours daily over last week and out of bed ~10-12 hours.   Time 4   Period Weeks   Status Achieved   PT SHORT TERM GOAL #2   Title reaches 5" anteriorly & manages pants for toileting in standing with rolling walker safely with no balance loss.  (Target Date: 04/14/14)   Baseline MET 04/14/14 patient reaches 5" anteriorly with RW support and reports managing pants while standing.   Time 4   Period Weeks   Status Partially Met   PT SHORT TERM GOAL #3   Title ambulates 76' with rolling walker & prosthesis with supervision.  (NEW Target Date: 05/12/14)   Baseline NOT MET 04/14/14 Patient ambulates 80' with RW & prosthesis with minimal gaurd /contact assist.    Time 4   Period Weeks   Status Revised           PT Long Term Goals - 05/11/14 1530    PT LONG TERM GOAL #1   Title Patient wears prosthesis daily >66% of awake hours without pain or skin issues.  (NEW Target Date: 06/09/14)   Baseline Progressing 05/11/2014 Patient wears prosthesis from 4 hours to 8 hours per day.   Time 4   Period Weeks   Status On-going   PT LONG TERM GOAL #2   Title verbalizes proper prosthetic care with daughter's verbal assistance as needed  (NEW Target Date: 06/09/14)   Baseline Progressing 05/11/2014 reports donning modified independent, cues on adjusting ply socks   Time 4   Period Weeks   Status On-going   PT LONG TERM GOAL #3   Title ambulates 30' with rolling walker & prosthesis modified independent.  (NEW Target Date: 06/09/14)   Baseline Progressing 05/11/2014 Patient ambulates 25' with RW & prosthesis with supervision on straight pathes & minA on turns, modA on backwards to position to sit.   Time 4   Period Weeks   Status On-going  PT LONG TERM GOAL #4   Title negotiates ramp & curb with rolling walker & prosthesis with daughter minimal assist safely  (Target  Date: 05/12/14)   Baseline uses w/c for all community PT discontinued as not safe as community ambulator. 05/12/14   Time 8   Period Weeks   Status Deferred   PT LONG TERM GOAL #5   Title reaches into upper / lower cabinets & manages clothes in standing with walker /counter support modified independent. (NEW Target Date: 06/09/14)   Baseline Progressing 05/12/2014 reaches to upper shelf on lower cabinet and lowest shelf on upper cabinet with RW support with supervision. MinA with managing clothes.   Time 4   Period Weeks   Status On-going   PT LONG TERM GOAL #6   Title Functional Status self-report on FOTO improves by 10 points.  (NEW Target Date: 06/09/14)   Baseline Not retested until discharge   Time 4   Period Weeks   Status On-going               Plan - 05/11/14 1530    Clinical Impression Statement Patient is progressing towards LTGs and functioning at household level with prosthesis safely. She would benefit from further PT / skilled care to improve independence and safety.   Pt will benefit from skilled therapeutic intervention in order to improve on the following deficits Abnormal gait;Decreased activity tolerance;Decreased balance;Decreased endurance;Decreased knowledge of use of DME;Decreased mobility;Decreased strength   Rehab Potential Good   PT Frequency 1x / week   PT Duration 4 weeks   PT Treatment/Interventions ADLs/Self Care Home Management;DME Instruction;Gait training;Stair training;Functional mobility training;Therapeutic activities;Therapeutic exercise;Balance training;Neuromuscular re-education;Patient/family education;Other (comment)  prosthetic training   PT Next Visit Plan gait with rolling walker turns & backing up, balance activities    Consulted and Agree with Plan of Care Patient;Family member/caregiver   Family Member Consulted daughter        Problem List Patient Active Problem List   Diagnosis Date Noted  . Essential hypertension, benign  11/16/2013  . Peripheral vascular disease, unspecified 07/20/2013  . Discoloration of skin-Right leg 07/15/2013  . Cold feeling-Right Leg 07/15/2013  . Pain in limb-Right Leg 07/15/2013  . Atherosclerotic PVD with ulceration 04/13/2013  . Critical lower limb ischemia 04/04/2013  . Claudication 04/03/2013  . UTI (lower urinary tract infection) 01/25/2013  . Urosepsis 01/25/2013  . Long term (current) use of anticoagulants 05/12/2012  . PAD (peripheral artery disease) 03/03/2012  . Liver cyst 02/26/2012  . Cyst, kidney, acquired 02/26/2012  . Kidney stone 02/26/2012  . Chronic kidney disease, stage 3 11/19/2011  . COPD (chronic obstructive pulmonary disease) 03/18/2011  . Osteoporosis 12/17/2010  . Type II diabetes mellitus, uncontrolled   . PVD (peripheral vascular disease)   . Mixed hyperlipidemia   . Renal artery stenosis   . Memory loss, short term   . Pulmonary nodule   . CAD (coronary artery disease)   . CHF (congestive heart failure)   . Anemia     Griffyn Kucinski PT, DPT 05/12/2014, 10:32 AM  [facility] [address] [place], [place], [address] Phone: [phone]   Fax:  [phone]

## 2014-05-18 ENCOUNTER — Ambulatory Visit: Payer: Medicare Other | Admitting: Physical Therapy

## 2014-05-18 ENCOUNTER — Encounter: Payer: Self-pay | Admitting: Physical Therapy

## 2014-05-18 DIAGNOSIS — R531 Weakness: Secondary | ICD-10-CM

## 2014-05-18 DIAGNOSIS — R269 Unspecified abnormalities of gait and mobility: Secondary | ICD-10-CM | POA: Diagnosis not present

## 2014-05-18 DIAGNOSIS — Z89611 Acquired absence of right leg above knee: Secondary | ICD-10-CM

## 2014-05-18 DIAGNOSIS — R2689 Other abnormalities of gait and mobility: Secondary | ICD-10-CM

## 2014-05-18 NOTE — Therapy (Signed)
[facility] [address] [place], [place], [address] Phone: [phone]   Fax:  [phone]  Physical Therapy Treatment  Patient Details  Name: Suzanne [name] MRN: [id number] Date of Birth: [birth year] Referring Provider:  Darlyne Russian, MD  Encounter Date: 05/18/2014      PT End of Session - 05/18/14 1434    Visit Number 10   Number of Visits 17   Date for PT Re-Evaluation 05/12/14   PT Start Time 1355   Equipment Utilized During Treatment Gait belt   Activity Tolerance Patient tolerated treatment well   Behavior During Therapy [facility] for tasks assessed/performed      Past Medical History  Diagnosis Date  . PVD (peripheral vascular disease)     a. PTA & stent right superficial artery PTA & left common iliac artery stenting. Right fem-pop bypass graft. b. Per Dr. Kennon Holter note: h/o aortobifemoral bypass grafting remotely as well as fem-peroneal bypass grafting.  . Mixed hyperlipidemia   . Compression fx, thoracic spine     T12  . Ischium fracture   . Memory loss, short term   . Pulmonary nodule   . Chronic diastolic CHF (congestive heart failure) 09-2008    a. EF 40% by cath 2010. b. Improved - last echo 2015 with normal EF.  [name] Anemia   . Detached retina 02-2005    R EYE  . Fracture 02-2007    L5 AND PELVIS  . Left ventricular dysfunction     a. EF 40% by cath 2010. b. Improved - last echo 2015 with normal EF.   [name] AAA (abdominal aortic aneurysm)     Repair unknown date  . Critical lower limb ischemia   . Chronic respiratory failure     a. On home O2.  [name] Hypertension   . Warfarin anticoagulation     DC summary from 2010 reports she is "on chronic  Coumadin for vascular disease." This is listed on her med list going back to 2006 by records available. Teaching service H&P remotely says she has h/o DVT but I cannot find any evidence of this. Admission H/P 03/2013 suggests history of atrial fib but all ECGs appear  sinus and there is no mention of this previously.  [name] COPD (chronic obstructive pulmonary disease)   . Type II or unspecified type diabetes mellitus without mention of complication, not stated as uncontrolled   . Complication of anesthesia     hard to wake up, bp problems   . Pneumonia     hx  . GERD (gastroesophageal reflux disease)   . Arthritis   . CAD (coronary artery disease) 10/12/2008    a. 30% LAD,30% CX & 40% tandem mid stenosis in the AV groove, 50-60% RCA  . Renal artery stenosis     a. 1-59% by doppler in 2014.; CKD stage III (Dr. Erling Cruz)  . On home oxygen therapy     "2.5L O2 at sleep or w/increased activity" (09/27/2013)    Past Surgical History  Procedure Laterality Date  . Abdominal aortic aneurysm repair  98    "lower left ventricle"  . Aorto-femoral bypass graft Bilateral     S/P aortobifemoral bypass grafting and right fem-peroneal bypass grafting remotely  . Cholecystectomy    . Cataract extraction Right 06-2006  . Vascular surgery    . Cardiac catheterization    . Retinal detachment surgery Right     "put buckle in; it popped & they had to remove  it"  . Eye surgery    . Patch angioplasty Left 04/19/2013    Procedure: VEIN PATCH ANGIOPLASTY - LEFT COMMON FEMORAL ARTERY;  Surgeon: Angelia Mould, MD;  Location: [place];  Service: Vascular;  Laterality: Left;  . Femoral-popliteal bypass graft Left 04/19/2013    Procedure: BYPASS GRAFT FEMORAL- BELOW KNEE POPLITEAL ARTERY WITH VEIN;  Surgeon: Angelia Mould, MD;  Location: [place];  Service: Vascular;  Laterality: Left;  . Fracture surgery      sternum  2008 no surgery  . Above knee leg amputation Right 09/27/2013  . Femoral-peroneal bypass graft    . Aorto-femoral bypass graft    . Patella fracture surgery Right ~ 1987    "shattered; work related injury"  . Total abdominal hysterectomy    . Amputation Right 09/27/2013    Procedure: AMPUTATION ABOVE KNEE-RIGHT;  Surgeon: Angelia Mould, MD;   Location: [place];  Service: Vascular;  Laterality: Right;  . Lower extremity angiogram Left 04/04/2013    Procedure: LOWER EXTREMITY ANGIOGRAM;  Surgeon: Lorretta Harp, MD;  Location: [facility] CATH [facility];  Service: Cardiovascular;  Laterality: Left;    There were no vitals filed for this visit.  Visit Diagnosis:  Abnormality of gait  Balance problem  Status post above knee amputation of right lower extremity  Weakness generalized      Subjective Assessment - 05/18/14 1400    Symptoms Patient reports she thought over discussion with PT last session and feels ready for discharge today. She has been standing to perform activities in her room and walking by herself with walker & prsothesis.   Currently in Pain? No/denies                       [facility] Adult PT Treatment/Exercise - 05/18/14 1400    Transfers   Transfers Sit to Stand;Stand to Sit   Sit to Stand 6: Modified independent (Device/Increase time);With upper extremity assist;With armrests;From chair/3-in-1  to RW for stabilization   Stand to Sit 6: Modified independent (Device/Increase time);With upper extremity assist;With armrests;To chair/3-in-1  from RW    Ambulation/Gait   Ambulation/Gait Yes   Ambulation/Gait Assistance 6: Modified independent (Device/Increase time)   Ambulation Distance (Feet) 25 Feet  25' including turning & backing up to chair, 50' X 1   Assistive device Rolling walker;Prosthesis   Gait Pattern Decreased step length - left;Decreased stance time - right;Decreased stride length;Decreased hip/knee flexion - right;Decreased weight shift to right;Right hip hike;Lateral hip instability;Trunk flexed;Abducted- right;Poor foot clearance - right;Step-through pattern   Ambulation Surface Level;Indoor   Gait Comments SpO2 93%, HR 103, Dyspnea 2/4  104 is 80% of max HR of 130 for her age.   Static Standing Balance   Static Standing - Balance Support Bilateral upper extremity supported;No upper extremity  supported;Left upper extremity supported;Right upper extremity supported;During functional activity   Static Standing - Level of Assistance 6: Modified independent (Device/Increase time)   Static Standing - Comment/# of Minutes 2 minutes   Dynamic Standing Balance   Dynamic Standing - Balance Support Right upper extremity supported;During functional activity  RW support   Dynamic Standing - Level of Assistance 6: Modified independent (Device/Increase time)   Dynamic Standing - Balance Activities Reaching for objects   Dynamic Standing - Comments simulating toileting, manages clothes with walker independently   Prosthetics   Current prosthetic wear tolerance (days/week)  daily 7 days /wk   Current prosthetic wear tolerance (#hours/day)  8 hours  reports  wear from dressing (1-2 hrs after arising) to ~8pm.    Current prosthetic weight-bearing tolerance (hours/day)  tolerated 5 minutes of standing with RW without pain   Residual limb condition  no open areas but fragile skin                PT Education - 05/18/14 1445    Education provided Yes   Education Details continuing ongoing activity level with high frequency of short walks and longer to tolerance walks with daughter / family supervision 3-5 days/wk.   Person(s) Educated Patient;Child(ren)   Methods Explanation;Demonstration   Comprehension Verbalized understanding          PT Short Term Goals - 04/14/14 1655    PT SHORT TERM GOAL #1   Title wears prostheis >/= 4 days /wk for >/= 50% of awake hours. (Target Date: 04/14/14)   Baseline MET 04/14/14 as reports wear total of 6 hours daily over last week and out of bed ~10-12 hours.   Time 4   Period Weeks   Status Achieved   PT SHORT TERM GOAL #2   Title reaches 5" anteriorly & manages pants for toileting in standing with rolling walker safely with no balance loss.  (Target Date: 04/14/14)   Baseline MET 04/14/14 patient reaches 5" anteriorly with RW support and reports  managing pants while standing.   Time 4   Period Weeks   Status Partially Met   PT SHORT TERM GOAL #3   Title ambulates 59' with rolling walker & prosthesis with supervision.  (NEW Target Date: 05/12/14)   Baseline NOT MET 04/14/14 Patient ambulates 76' with RW & prosthesis with minimal gaurd /contact assist.    Time 4   Period Weeks   Status Revised           PT Long Term Goals - 05/18/14 1400    PT LONG TERM GOAL #1   Title Patient wears prosthesis daily >66% of awake hours without pain or skin issues.  (NEW Target Date: 06/09/14)   Baseline MET 05/18/2014   Time 4   Period Weeks   Status Achieved   PT LONG TERM GOAL #2   Title verbalizes proper prosthetic care with daughter's verbal assistance as needed  (NEW Target Date: 06/09/14)   Baseline MET 05/18/2014   Time 4   Period Weeks   Status Achieved   PT LONG TERM GOAL #3   Title ambulates 30' with rolling walker & prosthesis modified independent.  (NEW Target Date: 06/09/14)   Baseline MET 05/18/2014   Time 4   Period Weeks   Status Achieved   PT LONG TERM GOAL #4   Title negotiates ramp & curb with rolling walker & prosthesis with daughter minimal assist safely  (Target Date: 05/12/14)   Baseline uses w/c for all community PT discontinued as not safe as community ambulator. 05/12/14   Time 8   Period Weeks   Status Deferred   PT LONG TERM GOAL #5   Title reaches into upper / lower cabinets & manages clothes in standing with walker /counter support modified independent. (NEW Target Date: 06/09/14)   Baseline MET 05/18/2014    Time 4   Period Weeks   Status Achieved   PT LONG TERM GOAL #6   Title Functional Status self-report on FOTO improves by 10 points.  (NEW Target Date: 06/09/14)   Baseline MET 05/18/2014 Functional Status improved to 40.6 from 29.5 initially.   Time 4   Period Weeks   Status  Achieved               Plan - 2014/06/16 1400    Clinical Impression Statement Patient met all LTGs.   Pt will benefit  from skilled therapeutic intervention in order to improve on the following deficits Abnormal gait;Decreased activity tolerance;Decreased balance;Decreased endurance;Decreased knowledge of use of DME;Decreased mobility;Decreased strength   Rehab Potential Good   PT Frequency 1x / week   PT Duration 4 weeks   PT Treatment/Interventions ADLs/Self Care Home Management;DME Instruction;Gait training;Stair training;Functional mobility training;Therapeutic activities;Therapeutic exercise;Balance training;Neuromuscular re-education;Patient/family education;Other (comment)  prosthetic training   PT Next Visit Plan discharge   Consulted and Agree with Plan of Care Patient;Family member/caregiver   Family Member Consulted daughter          G-Codes - 2014/06/16 Apr 28, 2114    Functional Assessment Tool Used ambulates 50' with rolling walker & prosthesis modified independent.   Functional Limitation Mobility: Walking and moving around   Mobility: Walking and Moving Around Goal Status [phone]) At least 40 percent but less than 60 percent impaired, limited or restricted   Mobility: Walking and Moving Around Discharge Status [phone]) At least 40 percent but less than 60 percent impaired, limited or restricted      Problem List Patient Active Problem List   Diagnosis Date Noted  . Essential hypertension, benign 11/16/2013  . Peripheral vascular disease, unspecified 07/20/2013  . Discoloration of skin-Right leg 07/15/2013  . Cold feeling-Right Leg 07/15/2013  . Pain in limb-Right Leg 07/15/2013  . Atherosclerotic PVD with ulceration 04/13/2013  . Critical lower limb ischemia 04/04/2013  . Claudication 04/03/2013  . UTI (lower urinary tract infection) 01/25/2013  . Urosepsis 01/25/2013  . Long term (current) use of anticoagulants 05/12/2012  . PAD (peripheral artery disease) 03/03/2012  . Liver cyst 02/26/2012  . Cyst, kidney, acquired 02/26/2012  . Kidney stone 02/26/2012  . Chronic kidney disease, stage 3  11/19/2011  . COPD (chronic obstructive pulmonary disease) 03/18/2011  . Osteoporosis 12/17/2010  . Type II diabetes mellitus, uncontrolled   . PVD (peripheral vascular disease)   . Mixed hyperlipidemia   . Renal artery stenosis   . Memory loss, short term   . Pulmonary nodule   . CAD (coronary artery disease)   . CHF (congestive heart failure)   . Anemia     Paden Kuras PT, DPT 16-Jun-2014, 9:20 PM  [facility] [address] [place], [place], [address] Phone: [phone]   Fax:  [phone]

## 2014-05-18 NOTE — Therapy (Signed)
[facility] [address] [place], [place], [address] Phone: [phone]   Fax:  [phone]  Patient Details  Name: Suzanne [name] MRN: [id number] Date of Birth: 11-22-1921 Referring Provider:  No ref. provider found  Encounter Date: 05/18/2014 PHYSICAL THERAPY DISCHARGE SUMMARY  Visits from Start of Care: 10  Current functional level related to goals / functional outcomes:     PT Long Term Goals - 05/18/14 1400    PT LONG TERM GOAL #1   Title Patient wears prosthesis daily >66% of awake hours without pain or skin issues.  (NEW Target Date: 06/09/14)   Baseline MET 05/18/2014   Time 4   Period Weeks   Status Achieved   PT LONG TERM GOAL #2   Title verbalizes proper prosthetic care with daughter's verbal assistance as needed  (NEW Target Date: 06/09/14)   Baseline MET 05/18/2014   Time 4   Period Weeks   Status Achieved   PT LONG TERM GOAL #3   Title ambulates 30' with rolling walker & prosthesis modified independent.  (NEW Target Date: 06/09/14)   Baseline MET 05/18/2014   Time 4   Period Weeks   Status Achieved   PT LONG TERM GOAL #4   Title negotiates ramp & curb with rolling walker & prosthesis with daughter minimal assist safely  (Target Date: 05/12/14)   Baseline uses w/c for all community PT discontinued as not safe as community ambulator. 05/12/14   Time 8   Period Weeks   Status Deferred   PT LONG TERM GOAL #5   Title reaches into upper / lower cabinets & manages clothes in standing with walker /counter support modified independent. (NEW Target Date: 06/09/14)   Baseline MET 05/18/2014    Time 4   Period Weeks   Status Achieved   PT LONG TERM GOAL #6   Title Functional Status self-report on FOTO improves by 10 points.  (NEW Target Date: 06/09/14)   Baseline MET 05/18/2014 Functional Status improved to 40.6 from 29.5 initially.   Time 4   Period Weeks   Status Achieved       Remaining deficits: This 81yo  female is limited in activity level by chronic medical issues including COPD. She improved her independence with prosthesis with walker at household level only.   Education / Equipment: Prosthetic care / use.  Plan: Patient agrees to discharge.  Patient goals were met. Patient is being discharged due to meeting the stated rehab goals.  ?????       Jamey Reas PT, DPT 05/18/2014, 9:22 PM  [facility] [address] [place], [place], [address] Phone: [phone]   Fax:  [phone]

## 2014-05-23 ENCOUNTER — Other Ambulatory Visit: Payer: Self-pay | Admitting: Physician Assistant

## 2014-05-25 ENCOUNTER — Encounter: Payer: Medicare Other | Admitting: Physical Therapy

## 2014-05-29 ENCOUNTER — Telehealth (HOSPITAL_COMMUNITY): Payer: Self-pay | Admitting: *Deleted

## 2014-05-29 NOTE — Telephone Encounter (Signed)
She needs a ROV with Dr Gwenlyn Found.  She is due for lower extremity dopplers with [facility] in June.  It appears that she is due for renal dopplers with [place] (actually in October 2015).

## 2014-05-29 NOTE — Telephone Encounter (Signed)
Pt called wanting to know if she should have any dopplers done this month. There isnt a recall in the system for anything but she is sure that she is due for something.  Please advise

## 2014-05-30 ENCOUNTER — Other Ambulatory Visit (HOSPITAL_COMMUNITY): Payer: Self-pay | Admitting: Cardiovascular Disease

## 2014-05-30 DIAGNOSIS — I701 Atherosclerosis of renal artery: Secondary | ICD-10-CM

## 2014-05-31 ENCOUNTER — Telehealth (HOSPITAL_COMMUNITY): Payer: Self-pay | Admitting: *Deleted

## 2014-05-31 NOTE — Telephone Encounter (Signed)
Pt says that she sees Dr. Florene Glen for her kidneys and does not think she needs to have a renal doppler scheduled with [place]. She does believe that she needs to have an aorta doppler scheduled because she has not had one in a while.  Please advise

## 2014-05-31 NOTE — Telephone Encounter (Signed)
I called and spoke with patient.  I explained the difference between seeing Dr Florene Glen and having a renal doppler.  Patient will proceed with the renal doppler.  Ebony, please proceed with scheduling the renal doppler.

## 2014-06-01 ENCOUNTER — Encounter: Payer: Medicare Other | Admitting: Physical Therapy

## 2014-06-08 ENCOUNTER — Encounter (HOSPITAL_COMMUNITY): Payer: Medicare Other

## 2014-06-08 ENCOUNTER — Encounter: Payer: Medicare Other | Admitting: Physical Therapy

## 2014-06-09 ENCOUNTER — Ambulatory Visit (HOSPITAL_COMMUNITY)
Admission: RE | Admit: 2014-06-09 | Discharge: 2014-06-09 | Disposition: A | Discharge status: Home / Self Care | Payer: Medicare Other | Source: Ambulatory Visit | Attending: Cardiology | Admitting: Cardiology

## 2014-06-09 DIAGNOSIS — I701 Atherosclerosis of renal artery: Secondary | ICD-10-CM | POA: Diagnosis present

## 2014-06-09 NOTE — Progress Notes (Signed)
Renal artery duplex completed.  °Brianna L Mazza,RVT °

## 2014-06-13 ENCOUNTER — Encounter: Payer: Self-pay | Admitting: *Deleted

## 2014-06-19 ENCOUNTER — Encounter: Payer: Self-pay | Admitting: Physical Therapy

## 2014-06-29 ENCOUNTER — Encounter: Payer: Self-pay | Admitting: Emergency Medicine

## 2014-06-29 ENCOUNTER — Ambulatory Visit (INDEPENDENT_AMBULATORY_CARE_PROVIDER_SITE_OTHER): Payer: Medicare Other | Admitting: Emergency Medicine

## 2014-06-29 VITALS — BP 136/66 | HR 87 | Temp 98.2°F | Resp 16

## 2014-06-29 DIAGNOSIS — E785 Hyperlipidemia, unspecified: Secondary | ICD-10-CM | POA: Diagnosis not present

## 2014-06-29 DIAGNOSIS — I701 Atherosclerosis of renal artery: Secondary | ICD-10-CM | POA: Diagnosis not present

## 2014-06-29 DIAGNOSIS — E1122 Type 2 diabetes mellitus with diabetic chronic kidney disease: Secondary | ICD-10-CM | POA: Diagnosis not present

## 2014-06-29 DIAGNOSIS — N189 Chronic kidney disease, unspecified: Secondary | ICD-10-CM

## 2014-06-29 DIAGNOSIS — D649 Anemia, unspecified: Secondary | ICD-10-CM

## 2014-06-29 DIAGNOSIS — I739 Peripheral vascular disease, unspecified: Secondary | ICD-10-CM

## 2014-06-29 DIAGNOSIS — J439 Emphysema, unspecified: Secondary | ICD-10-CM | POA: Diagnosis not present

## 2014-06-29 LAB — BASIC METABOLIC PANEL
BUN: 38 mg/dL — ABNORMAL HIGH (ref 6–23)
CO2: 27 meq/L (ref 19–32)
Calcium: 9.3 mg/dL (ref 8.4–10.5)
Chloride: 104 mEq/L (ref 96–112)
Creat: 1.96 mg/dL — ABNORMAL HIGH (ref 0.50–1.10)
Glucose, Bld: 136 mg/dL — ABNORMAL HIGH (ref 70–99)
Potassium: 4.3 mEq/L (ref 3.5–5.3)
SODIUM: 140 meq/L (ref 135–145)

## 2014-06-29 LAB — CBC WITH DIFFERENTIAL/PLATELET
Basophils Absolute: 0.1 10*3/uL (ref 0.0–0.1)
Basophils Relative: 1 % (ref 0–1)
Eosinophils Absolute: 0.2 10*3/uL (ref 0.0–0.7)
Eosinophils Relative: 3 % (ref 0–5)
HEMATOCRIT: 32 % — AB (ref 36.0–46.0)
Hemoglobin: 10.7 g/dL — ABNORMAL LOW (ref 12.0–15.0)
LYMPHS PCT: 23 % (ref 12–46)
Lymphs Abs: 1.7 10*3/uL (ref 0.7–4.0)
MCH: 30.7 pg (ref 26.0–34.0)
MCHC: 33.4 g/dL (ref 30.0–36.0)
MCV: 92 fL (ref 78.0–100.0)
MONO ABS: 0.5 10*3/uL (ref 0.1–1.0)
MONOS PCT: 7 % (ref 3–12)
MPV: 9.3 fL (ref 8.6–12.4)
NEUTROS ABS: 4.9 10*3/uL (ref 1.7–7.7)
Neutrophils Relative %: 66 % (ref 43–77)
Platelets: 229 10*3/uL (ref 150–400)
RBC: 3.48 MIL/uL — AB (ref 3.87–5.11)
RDW: 12.7 % (ref 11.5–15.5)
WBC: 7.4 10*3/uL (ref 4.0–10.5)

## 2014-06-29 LAB — POCT GLYCOSYLATED HEMOGLOBIN (HGB A1C): Hemoglobin A1C: 6.7

## 2014-06-29 LAB — GLUCOSE, POCT (MANUAL RESULT ENTRY): POC GLUCOSE: 155 mg/dL — AB (ref 70–99)

## 2014-06-29 MED ORDER — LIDOCAINE 5 % EX PTCH: 1.0000 | MEDICATED_PATCH | CUTANEOUS | Status: DC

## 2014-06-29 MED ORDER — BUDESONIDE-FORMOTEROL FUMARATE 160-4.5 MCG/ACT IN AERO: 2.0000 | INHALATION_SPRAY | Freq: Two times a day (BID) | RESPIRATORY_TRACT | Status: AC

## 2014-06-29 MED ORDER — LIDOCAINE 5 % EX PTCH: 1.0000 | MEDICATED_PATCH | CUTANEOUS | Status: AC

## 2014-06-29 MED ORDER — INSULIN GLARGINE 100 UNIT/ML SOLOSTAR PEN: 8.0000 [IU] | PEN_INJECTOR | Freq: Every day | SUBCUTANEOUS | Status: DC

## 2014-06-29 MED ORDER — FISH OIL 1200 MG PO CAPS: 1200.0000 mg | ORAL_CAPSULE | Freq: Two times a day (BID) | ORAL | Status: DC

## 2014-06-29 NOTE — Progress Notes (Signed)
Subjective:  This chart was scribed for Darlyne Russian, MD by Ladene Artist, ED Scribe. The patient was seen in room 21. Patient's care was started at 11:04 AM.   Patient ID: Suzanne [name], female    DOB: [birth year], 81 y.o.   MRN: [id number]  Chief Complaint  Patient presents with  . Follow-up    diabetes  . Medication Refill    symbicort and lidoderm patches   HPI  HPI Comments: Suzanne [name] is a 81 y.o. female, with a h/ AAA, PVD, CHF, HTN, CAD, COPD, DM, hyperlipidemia, who presents to the [facility] for an initial follow-up of DM. Pt reports one sweaty spell this morning but has not had any other hypoglycemic episodes. Overall, pt reports that she has a good appetite and functions well at home.   Shortness of Breath Pt's SOB has not changed. She continues to use her inhalers.   R BK Amputation  Pt has experienced some phantom limb pain at site of R BK amputation, but this has not been frequent.   Diabetes Pt has been on 10 units of insulin which we will decrease to 8.   Past Medical History  Diagnosis Date  . PVD (peripheral vascular disease)     a. PTA & stent right superficial artery PTA & left common iliac artery stenting. Right fem-pop bypass graft. b. Per Dr. Kennon Holter note: h/o aortobifemoral bypass grafting remotely as well as fem-peroneal bypass grafting.  . Mixed hyperlipidemia   . Compression fx, thoracic spine     T12  . Ischium fracture   . Memory loss, short term   . Pulmonary nodule   . Chronic diastolic CHF (congestive heart failure) 09-2008    a. EF 40% by cath 2010. b. Improved - last echo 2015 with normal EF.  [name] Anemia   . Detached retina 02-2005    R EYE  . Fracture 02-2007    L5 AND PELVIS  . Left ventricular dysfunction     a. EF 40% by cath 2010. b. Improved - last echo 2015 with normal EF.   [name] AAA (abdominal aortic aneurysm)     Repair unknown date  . Critical lower limb ischemia   . Chronic respiratory failure     a.  On home O2.  [name] Hypertension   . Warfarin anticoagulation     DC summary from 2010 reports she is "on chronic  Coumadin for vascular disease." This is listed on her med list going back to 2006 by records available. Teaching service H&P remotely says she has h/o DVT but I cannot find any evidence of this. Admission H/P 03/2013 suggests history of atrial fib but all ECGs appear sinus and there is no mention of this previously.  [name] COPD (chronic obstructive pulmonary disease)   . Type II or unspecified type diabetes mellitus without mention of complication, not stated as uncontrolled   . Complication of anesthesia     hard to wake up, bp problems   . Pneumonia     hx  . GERD (gastroesophageal reflux disease)   . Arthritis   . CAD (coronary artery disease) 10/12/2008    a. 30% LAD,30% CX & 40% tandem mid stenosis in the AV groove, 50-60% RCA  . Renal artery stenosis     a. 1-59% by doppler in 2014.; CKD stage III (Dr. Erling Cruz)  . On home oxygen therapy     "2.5L O2 at sleep or w/increased activity" (09/27/2013)  Current Outpatient Prescriptions on File Prior to Visit  Medication Sig Dispense Refill  . acetaminophen (TYLENOL) 500 MG tablet Take 1,000 mg by mouth every 4 (four) hours as needed for moderate pain.     [name] albuterol (PROVENTIL) (2.5 MG/3ML) 0.083% nebulizer solution Take 2.5 mg by nebulization 3 (three) times daily as needed for wheezing or shortness of breath.     . Alcohol Swabs (ALCOHOL PREP) 70 % PADS To use before injecting lantus 100 each 4  . aspirin EC 325 MG EC tablet Take 1 tablet (325 mg total) by mouth daily.    . budesonide-formoterol (SYMBICORT) 160-4.5 MCG/ACT inhaler Inhale 2 puffs into the lungs 2 (two) times daily. 3 Inhaler 1  . Calcium Carbonate-Vitamin D (CALCIUM 600+D) 600-400 MG-UNIT per tablet Use bid 180 tablet 4  . esomeprazole (NEXIUM) 40 MG capsule Take 1 capsule (40 mg total) by mouth daily before breakfast. 90 capsule 4  . ferrous sulfate 325 (65 FE)  MG tablet Take 1 tablet (325 mg total) by mouth daily with breakfast. 90 tablet 4  . furosemide (LASIX) 40 MG tablet Take 1 tablet (40 mg total) by mouth daily. 90 tablet 4  . Insulin Pen Needle (PEN NEEDLES 3/16") 31G X 5 MM MISC To use once daily with LANTUS pens 100 each 4  . Ipratropium-Albuterol (COMBIVENT RESPIMAT) 20-100 MCG/ACT AERS respimat Inhale 2 puffs into the lungs every 6 (six) hours as needed for wheezing or shortness of breath. 3 Inhaler 4  . LANTUS SOLOSTAR 100 UNIT/ML Solostar Pen INJECT 12 UNITS UNDER THE SKIN DAILY 15 mL 0  . lidocaine (LIDODERM) 5 % Place 1 patch onto the skin daily. Remove & Discard patch within 12 hours or as directed by MD    . Omega-3 Fatty Acids (FISH OIL) 1200 MG CAPS Take 1 capsule (1,200 mg total) by mouth 2 (two) times daily. 90 capsule 4  . ranitidine (ZANTAC) 150 MG tablet Take 1 tablet (150 mg total) by mouth 2 (two) times daily. 180 tablet 4  . rosuvastatin (CRESTOR) 20 MG tablet Take 1 tablet (20 mg total) by mouth daily. 90 tablet 1   No current facility-administered medications on file prior to visit.   Allergies  Allergen Reactions  . Codeine Other (See Comments)    REACTION: hallucinations  . Hydrochlorothiazide Other (See Comments)    REACTION: hypotension  . Pregabalin Other (See Comments)    REACTION: unknown  . Other Rash    Adhesives and bandages gives pt a rash.   Review of Systems  Constitutional: Negative for appetite change.  Respiratory: Positive for shortness of breath (unchanged).   Musculoskeletal: Positive for myalgias.   BP 136/66 mmHg  Pulse 87  Temp(Src) 98.2 F (36.8 C) (Oral)  Resp 16  SpO2 91%    Objective:   Physical Exam  Constitutional: She is oriented to person, place, and time. She appears well-developed and well-nourished. No distress.  HENT:  Head: Normocephalic and atraumatic.  Eyes: Conjunctivae and EOM are normal.  Neck: Neck supple. No tracheal deviation present.  Cardiovascular: Normal  rate.   Pulmonary/Chest: Effort normal. No respiratory distress.  Musculoskeletal: Normal range of motion.  R BK amputation.  Neurological: She is alert and oriented to person, place, and time.  Skin: Skin is warm and dry.  Psychiatric: She has a normal mood and affect. Her behavior is normal.  Nursing note and vitals reviewed. I personally performed the services described in this documentation, which was scribed in my presence. The  recorded information has been reviewed and is accurate.  Nena Jordan, MD    Assessment & Plan:   Patient did have a sweaty spell this morning. I cut back her insulin to 8 units at night. She will also have a snack at bedtime.I personally performed the services described in this documentation, which was scribed in my presence. The recorded information has been reviewed and is accurate.  Nena Jordan, MD

## 2014-07-13 ENCOUNTER — Ambulatory Visit: Payer: Medicare Other | Admitting: Emergency Medicine

## 2014-07-19 ENCOUNTER — Encounter: Payer: Self-pay | Admitting: Cardiovascular Disease

## 2014-07-19 ENCOUNTER — Ambulatory Visit (INDEPENDENT_AMBULATORY_CARE_PROVIDER_SITE_OTHER): Payer: Medicare Other | Admitting: Cardiovascular Disease

## 2014-07-19 VITALS — BP 128/54 | HR 89 | Ht 63.0 in

## 2014-07-19 DIAGNOSIS — I10 Essential (primary) hypertension: Secondary | ICD-10-CM

## 2014-07-19 DIAGNOSIS — I2583 Coronary atherosclerosis due to lipid rich plaque: Secondary | ICD-10-CM

## 2014-07-19 DIAGNOSIS — I701 Atherosclerosis of renal artery: Secondary | ICD-10-CM | POA: Diagnosis not present

## 2014-07-19 DIAGNOSIS — I2510 Atherosclerotic heart disease of native coronary artery without angina pectoris: Secondary | ICD-10-CM

## 2014-07-19 DIAGNOSIS — E782 Mixed hyperlipidemia: Secondary | ICD-10-CM

## 2014-07-19 DIAGNOSIS — I739 Peripheral vascular disease, unspecified: Secondary | ICD-10-CM | POA: Diagnosis not present

## 2014-07-19 NOTE — Assessment & Plan Note (Signed)
History of noncritical CAD by cardiac catheterization which I performed 10/12/08. Her EF at that time was 35-40% by 2-D echo but had normalized 02/17/2011

## 2014-07-19 NOTE — Assessment & Plan Note (Signed)
History of hypertension blood pressure measures at 120/54. She is not on antihypertensives medications at this time

## 2014-07-19 NOTE — Assessment & Plan Note (Signed)
History of hyperlipidemia on Crestor 20 mg a day followed by her PCP

## 2014-07-19 NOTE — Assessment & Plan Note (Signed)
History of 80% right renal artery stenosis found at the time of remote angiography 02/02/98. We have been following her duplex ultrasound recently performed that showed this was stable. Given her age I decided to stop checking renal Doppler studies.

## 2014-07-19 NOTE — Assessment & Plan Note (Signed)
History of peripheral arterial disease status post remote iliac PTA and stenting by myself in 1997 as well as right SFA PTA. Following this she had aortobifemoral bypass grafting performed in Dr. Marya Amsler case as well as right femoral to peroneal bypass grafting. When I angiogramed her 02/02/98 she also had a 60% distal left SFA stenosis with 2 vessel runoff she had high velocities noted on 2+ ultrasound in her distal left SFA for many years and because of chronic pain and ongoing ischemia I elected to angiogram her 04/04/13 revealing a 95% calcified plaque in the left common femoral artery just distal to the left limb of aortobifemoral bypass graft as well as tandem 95% stenoses in the distal left SFA and the above-knee popliteal artery with 1 vessel runoff via the anterior tibial. She also underwent endarterectomy with patch and plasty of her left common femoral artery with left femoral to below-the-knee popliteal bypass grafting by Dr. Scot Dock on 04/19/13 for limb salvage.her last lower extremity arterial Doppler studies in our office performed 09/08/13 revealed a patent left M to below-the-knee popliteal bypass graft as well as left common femoral endarterectomy site with an occluded right fem- peroneal bypass graft. She ultimately developed gangrene and critical limb ischemia on the right side and underwent right above-the-knee amputation by Dr. Gae Gallop. Her stump has healed well and she is being fitted with a prosthesis.

## 2014-07-19 NOTE — Patient Instructions (Signed)
Dr Gwenlyn Found has requested that you have a lower extremity arterial duplex. This test is an ultrasound of the arteries in the leg. It looks at arterial blood flow in the leg. Allow one hour for Lower Arterial scans. There are no restrictions or special instructions.  Dr Gwenlyn Found recommends that you schedule a follow-up appointment in 1 year. You will receive a reminder letter in the mail two months in advance. If you don't receive a letter, please call our office to schedule the follow-up appointment.

## 2014-07-19 NOTE — Progress Notes (Signed)
07/19/2014 Suzanne [name]   [birth year]  [id number]  Primary Physician DAUB, Lina Sayre, MD Primary Cardiologist: Lorretta Harp MD Renae Gloss   HPI:  The patient is an 81 year old, thin and frail-appearing, widowed Caucasian female, mother of 1 daughter Suzanne [name]) who accompanies her today. I last saw her approximately 03/24/13.[name] She has a history of mild CAD by cath which I performed October 12, 2008. Her EF at that time was 35% to 40% and by echo her EF is normal done February 17, 8840, with diastolic dysfunction. She has had episodes of CHF exacerbation. Her other problems include COPD on home oxygen, hypertension and hyperlipidemia and peripheral vascular occlusive disease. She has had aortobifemoral bypass grafting remotely as well as fem-peroneal bypass grafting. I last angiogram'd her February 02, 1998, revealing patient aortobifemoral and a patent right fem-peroneal bypass graft with a high-grade lesion beyond the graft insertion. She had 1-vessel runoff on the right, 2 on the left with high-grade distal left SFA disease on the left and bilateral renal artery stenosis. She denies chest pain but is chronically short of breath. She has developed an open wound on her left heel and is going to the wound care center. Her last Doppler of her lower extremities performed a year ago revealed a right ABI of 0.75 with a patent fem-peroneal bypass graft, an occluded right SFA with 1-vessel runoff and high-grade left common femoral and SFA disease. She also has moderate renal insufficiency followed by Dr. Erling Cruz. Her major complaints are shortness of breath. She is on home O2. She is scheduled to see Dr. Everlene Farrier in the office next week for continued evaluation. She denies chest pain.  Because of ongoing pain in her left leg I angiogram at her 04/04/13 revealed a high-grade calcified exophytic plaque in the left common femoral artery with high-grade distal left SFA and popliteal disease with  two-vessel runoff. Dr. Scot Dock performed endarterectomy patch angioplasty as well as left fem-tib below the knee popliteal bypass grafting on 04/19/13. Her Dopplers performed in July of last year showed a patent femoropopliteal bypass graft on the left but occluded femoral peroneal bypass graft on the right. The following night she developed critical limb ischemia and ultimately underwent right above-the-knee amputation. She currently gets around in a wheelchair. She is being fitted with a prosthesis.   Current Outpatient Prescriptions  Medication Sig Dispense Refill  . acetaminophen (TYLENOL) 500 MG tablet Take 1,000 mg by mouth every 4 (four) hours as needed for moderate pain.     [name] albuterol (PROVENTIL) (2.5 MG/3ML) 0.083% nebulizer solution Take 2.5 mg by nebulization 3 (three) times daily as needed for wheezing or shortness of breath.     . Alcohol Swabs (ALCOHOL PREP) 70 % PADS To use before injecting lantus 100 each 4  . aspirin EC 325 MG EC tablet Take 1 tablet (325 mg total) by mouth daily.    . budesonide-formoterol (SYMBICORT) 160-4.5 MCG/ACT inhaler Inhale 2 puffs into the lungs 2 (two) times daily. 3 Inhaler 3  . Calcium Carbonate-Vitamin D (CALCIUM 600+D) 600-400 MG-UNIT per tablet Use bid 180 tablet 4  . esomeprazole (NEXIUM) 40 MG capsule Take 1 capsule (40 mg total) by mouth daily before breakfast. 90 capsule 4  . ferrous sulfate 325 (65 FE) MG tablet Take 1 tablet (325 mg total) by mouth daily with breakfast. 90 tablet 4  . furosemide (LASIX) 40 MG tablet Take 1 tablet (40 mg total) by mouth daily. 90 tablet 4  .  Insulin Glargine (LANTUS SOLOSTAR) 100 UNIT/ML Solostar Pen Inject 8 Units into the skin daily. 15 mL 3  . Insulin Pen Needle (PEN NEEDLES 3/16") 31G X 5 MM MISC To use once daily with LANTUS pens 100 each 4  . Ipratropium-Albuterol (COMBIVENT RESPIMAT) 20-100 MCG/ACT AERS respimat Inhale 2 puffs into the lungs every 6 (six) hours as needed for wheezing or shortness of  breath. 3 Inhaler 4  . lidocaine (LIDODERM) 5 % Place 1 patch onto the skin daily. Remove & Discard patch within 12 hours or as directed by MD 90 patch 3  . Omega-3 Fatty Acids (FISH OIL) 1200 MG CAPS Take 1 capsule (1,200 mg total) by mouth 2 (two) times daily. 180 capsule 3  . ranitidine (ZANTAC) 150 MG tablet Take 1 tablet (150 mg total) by mouth 2 (two) times daily. 180 tablet 4  . rosuvastatin (CRESTOR) 20 MG tablet Take 1 tablet (20 mg total) by mouth daily. 90 tablet 1   No current facility-administered medications for this visit.    Allergies  Allergen Reactions  . Codeine Other (See Comments)    REACTION: hallucinations  . Hydrochlorothiazide Other (See Comments)    REACTION: hypotension  . Pregabalin Other (See Comments)    REACTION: unknown  . Other Rash    Adhesives and bandages gives pt a rash.    History   Social History  . Marital Status: Widowed    Spouse Name: N/A  . Number of Children: N/A  . Years of Education: N/A   Occupational History  . Not on file.   Social History Main Topics  . Smoking status: Former Smoker -- 2.00 packs/day for 40 years    Types: Cigarettes    Quit date: 02/16/1997  . Smokeless tobacco: Never Used  . Alcohol Use: No  . Drug Use: No  . Sexual Activity: No   Other Topics Concern  . Not on file   Social History Narrative   Lives by self, cares for self, drives.       Review of Systems: General: negative for chills, fever, night sweats or weight changes.  Cardiovascular: negative for chest pain, dyspnea on exertion, edema, orthopnea, palpitations, paroxysmal nocturnal dyspnea or shortness of breath Dermatological: negative for rash Respiratory: negative for cough or wheezing Urologic: negative for hematuria Abdominal: negative for nausea, vomiting, diarrhea, bright red blood per rectum, melena, or hematemesis Neurologic: negative for visual changes, syncope, or dizziness All other systems reviewed and are otherwise  negative except as noted above.    Blood pressure 128/54, pulse 89, height '5\' 3"'$  (1.6 m).  General appearance: alert and no distress Neck: no adenopathy, no JVD, supple, symmetrical, trachea midline, thyroid not enlarged, symmetric, no tenderness/mass/nodules and bilateral carotid bruits Lungs: clear to auscultation bilaterally Heart: regular rate and rhythm, S1, S2 normal, no murmur, click, rub or gallop Extremities: healed stump right above-the-knee and dictation  EKG sinus rhythm at 89 with septal Q waves and nonspecific ST-T wave changes with left axis deviation. I personally reviewed this EKG  ASSESSMENT AND PLAN:   Renal artery stenosis History of 80% right renal artery stenosis found at the time of remote angiography 02/02/98. We have been following her duplex ultrasound recently performed that showed this was stable. Given her age I decided to stop checking renal Doppler studies.   PVD (peripheral vascular disease) History of peripheral arterial disease status post remote iliac PTA and stenting by myself in 1997 as well as right SFA PTA. Following this she had  aortobifemoral bypass grafting performed in Dr. Marya Amsler case as well as right femoral to peroneal bypass grafting. When I angiogramed her 02/02/98 she also had a 60% distal left SFA stenosis with 2 vessel runoff she had high velocities noted on 2+ ultrasound in her distal left SFA for many years and because of chronic pain and ongoing ischemia I elected to angiogram her 04/04/13 revealing a 95% calcified plaque in the left common femoral artery just distal to the left limb of aortobifemoral bypass graft as well as tandem 95% stenoses in the distal left SFA and the above-knee popliteal artery with 1 vessel runoff via the anterior tibial. She also underwent endarterectomy with patch and plasty of her left common femoral artery with left femoral to below-the-knee popliteal bypass grafting by Dr. Scot Dock on 04/19/13 for limb salvage.her last  lower extremity arterial Doppler studies in our office performed 09/08/13 revealed a patent left M to below-the-knee popliteal bypass graft as well as left common femoral endarterectomy site with an occluded right fem- peroneal bypass graft. She ultimately developed gangrene and critical limb ischemia on the right side and underwent right above-the-knee amputation by Dr. Gae Gallop. Her stump has healed well and she is being fitted with a prosthesis.   Mixed hyperlipidemia History of hyperlipidemia on Crestor 20 mg a day followed by her PCP   Essential hypertension, benign History of hypertension blood pressure measures at 120/54. She is not on antihypertensives medications at this time   CAD (coronary artery disease) History of noncritical CAD by cardiac catheterization which I performed 10/12/08. Her EF at that time was 35-40% by 2-D echo but had normalized 02/17/2011       Lorretta Harp MD [facility], [place] 07/19/2014 3:56 PM

## 2014-07-27 ENCOUNTER — Encounter: Payer: Self-pay | Admitting: Emergency Medicine

## 2014-07-27 DIAGNOSIS — E0859 Diabetes mellitus due to underlying condition with other circulatory complications: Secondary | ICD-10-CM | POA: Insufficient documentation

## 2014-08-02 ENCOUNTER — Encounter (HOSPITAL_COMMUNITY): Payer: Medicare Other

## 2014-08-02 ENCOUNTER — Ambulatory Visit: Payer: Medicare Other | Admitting: Vascular Surgery

## 2014-08-21 ENCOUNTER — Other Ambulatory Visit: Payer: Self-pay | Admitting: Emergency Medicine

## 2014-08-29 ENCOUNTER — Telehealth: Payer: Self-pay

## 2014-08-29 NOTE — Telephone Encounter (Signed)
Paperwork in your box to fill out for pt to get a refill on her nebulizer solution. Please sign and place in my box when complete.

## 2014-09-06 NOTE — Patient Outreach (Signed)
[facility]) Care Management  09/06/2014  Suzanne [name] [birth year] [id number]   Referral from Marshall List, assigned [name], RN to outreach.  Suzanne [name]. [place], [place] Management [place] Assistant Phone: [phone] Fax: [phone]

## 2014-09-11 ENCOUNTER — Other Ambulatory Visit: Payer: Self-pay | Admitting: Cardiovascular Disease

## 2014-09-11 DIAGNOSIS — I739 Peripheral vascular disease, unspecified: Secondary | ICD-10-CM

## 2014-09-18 DIAGNOSIS — J90 Pleural effusion, not elsewhere classified: Secondary | ICD-10-CM

## 2014-09-18 HISTORY — DX: Pleural effusion, not elsewhere classified: J90

## 2014-09-27 ENCOUNTER — Ambulatory Visit (HOSPITAL_COMMUNITY)
Admission: RE | Admit: 2014-09-27 | Discharge: 2014-09-27 | Disposition: A | Discharge status: Home / Self Care | Payer: Medicare Other | Source: Ambulatory Visit | Attending: Internal Medicine | Admitting: Internal Medicine

## 2014-09-27 DIAGNOSIS — I739 Peripheral vascular disease, unspecified: Secondary | ICD-10-CM | POA: Insufficient documentation

## 2014-09-29 ENCOUNTER — Encounter: Payer: Self-pay | Admitting: *Deleted

## 2014-10-01 ENCOUNTER — Other Ambulatory Visit: Payer: Self-pay | Admitting: Emergency Medicine

## 2014-10-02 ENCOUNTER — Other Ambulatory Visit: Payer: Self-pay

## 2014-10-02 NOTE — Patient Outreach (Signed)
[place] [facility]) Care Management  10/02/2014  Suzanne [name] 17-Mar-1921 [id number]   Telephone call to patient regarding high risk list referral.  Discussed and offered [facility] care management services to patient.  Patient declined services.  Patient given contact name and phone number for [facility] care management and advised to call if needs arise.  PLAN; RNCM will refer to Lurline Del to close due to refusal of services. RNCM will notify patients primary MD of refusal of services.  [name] RN,BSN,CCM [place] Coordinator [phone]

## 2014-10-06 NOTE — Patient Outreach (Signed)
[place]) Care Management  10/02/2014  Suzanne [name] [birth year] [id number]   Notification from [name], RN to close case due to patient refused [place] Management services.  Thanks, [name]. [place], [place] Assistant Phone: [phone] Fax: [phone]

## 2014-10-17 ENCOUNTER — Inpatient Hospital Stay (HOSPITAL_COMMUNITY)
Admission: EM | Admit: 2014-10-17 | Discharge: 2014-10-24 | DRG: 180 | Disposition: A | Discharge status: Hospice | Payer: Medicare Other | Attending: Family Medicine | Admitting: Family Medicine

## 2014-10-17 ENCOUNTER — Encounter (HOSPITAL_COMMUNITY): Payer: Self-pay

## 2014-10-17 ENCOUNTER — Ambulatory Visit (INDEPENDENT_AMBULATORY_CARE_PROVIDER_SITE_OTHER): Payer: Medicare Other | Admitting: Emergency Medicine

## 2014-10-17 ENCOUNTER — Telehealth: Payer: Self-pay | Admitting: Cardiovascular Disease

## 2014-10-17 ENCOUNTER — Inpatient Hospital Stay (HOSPITAL_COMMUNITY): Payer: Medicare Other

## 2014-10-17 ENCOUNTER — Encounter: Payer: Self-pay | Admitting: Emergency Medicine

## 2014-10-17 ENCOUNTER — Emergency Department (HOSPITAL_COMMUNITY): Payer: Medicare Other

## 2014-10-17 VITALS — BP 113/70 | HR 90 | Temp 98.3°F | Resp 18

## 2014-10-17 DIAGNOSIS — Z87891 Personal history of nicotine dependence: Secondary | ICD-10-CM | POA: Diagnosis not present

## 2014-10-17 DIAGNOSIS — I5030 Unspecified diastolic (congestive) heart failure: Secondary | ICD-10-CM

## 2014-10-17 DIAGNOSIS — K5900 Constipation, unspecified: Secondary | ICD-10-CM | POA: Diagnosis not present

## 2014-10-17 DIAGNOSIS — E782 Mixed hyperlipidemia: Secondary | ICD-10-CM | POA: Diagnosis present

## 2014-10-17 DIAGNOSIS — J90 Pleural effusion, not elsewhere classified: Secondary | ICD-10-CM | POA: Diagnosis present

## 2014-10-17 DIAGNOSIS — R918 Other nonspecific abnormal finding of lung field: Secondary | ICD-10-CM | POA: Insufficient documentation

## 2014-10-17 DIAGNOSIS — I209 Angina pectoris, unspecified: Secondary | ICD-10-CM | POA: Diagnosis not present

## 2014-10-17 DIAGNOSIS — H3321 Serous retinal detachment, right eye: Secondary | ICD-10-CM | POA: Diagnosis present

## 2014-10-17 DIAGNOSIS — K219 Gastro-esophageal reflux disease without esophagitis: Secondary | ICD-10-CM | POA: Diagnosis present

## 2014-10-17 DIAGNOSIS — J438 Other emphysema: Secondary | ICD-10-CM | POA: Diagnosis not present

## 2014-10-17 DIAGNOSIS — Z885 Allergy status to narcotic agent status: Secondary | ICD-10-CM

## 2014-10-17 DIAGNOSIS — C3490 Malignant neoplasm of unspecified part of unspecified bronchus or lung: Secondary | ICD-10-CM | POA: Diagnosis present

## 2014-10-17 DIAGNOSIS — N390 Urinary tract infection, site not specified: Secondary | ICD-10-CM | POA: Diagnosis present

## 2014-10-17 DIAGNOSIS — Z809 Family history of malignant neoplasm, unspecified: Secondary | ICD-10-CM | POA: Diagnosis not present

## 2014-10-17 DIAGNOSIS — J439 Emphysema, unspecified: Secondary | ICD-10-CM | POA: Diagnosis not present

## 2014-10-17 DIAGNOSIS — E876 Hypokalemia: Secondary | ICD-10-CM | POA: Diagnosis not present

## 2014-10-17 DIAGNOSIS — Z66 Do not resuscitate: Secondary | ICD-10-CM | POA: Diagnosis present

## 2014-10-17 DIAGNOSIS — Z791 Long term (current) use of non-steroidal anti-inflammatories (NSAID): Secondary | ICD-10-CM

## 2014-10-17 DIAGNOSIS — I480 Paroxysmal atrial fibrillation: Secondary | ICD-10-CM | POA: Diagnosis not present

## 2014-10-17 DIAGNOSIS — J9611 Chronic respiratory failure with hypoxia: Secondary | ICD-10-CM | POA: Diagnosis present

## 2014-10-17 DIAGNOSIS — I701 Atherosclerosis of renal artery: Secondary | ICD-10-CM | POA: Diagnosis present

## 2014-10-17 DIAGNOSIS — R0902 Hypoxemia: Secondary | ICD-10-CM | POA: Diagnosis not present

## 2014-10-17 DIAGNOSIS — I2584 Coronary atherosclerosis due to calcified coronary lesion: Secondary | ICD-10-CM | POA: Diagnosis not present

## 2014-10-17 DIAGNOSIS — Z7982 Long term (current) use of aspirin: Secondary | ICD-10-CM | POA: Diagnosis not present

## 2014-10-17 DIAGNOSIS — N189 Chronic kidney disease, unspecified: Secondary | ICD-10-CM | POA: Diagnosis not present

## 2014-10-17 DIAGNOSIS — J449 Chronic obstructive pulmonary disease, unspecified: Secondary | ICD-10-CM | POA: Diagnosis present

## 2014-10-17 DIAGNOSIS — I313 Pericardial effusion (noninflammatory): Secondary | ICD-10-CM | POA: Diagnosis present

## 2014-10-17 DIAGNOSIS — Z794 Long term (current) use of insulin: Secondary | ICD-10-CM | POA: Diagnosis not present

## 2014-10-17 DIAGNOSIS — I129 Hypertensive chronic kidney disease with stage 1 through stage 4 chronic kidney disease, or unspecified chronic kidney disease: Secondary | ICD-10-CM | POA: Diagnosis present

## 2014-10-17 DIAGNOSIS — I447 Left bundle-branch block, unspecified: Secondary | ICD-10-CM | POA: Diagnosis present

## 2014-10-17 DIAGNOSIS — Z888 Allergy status to other drugs, medicaments and biological substances status: Secondary | ICD-10-CM | POA: Diagnosis not present

## 2014-10-17 DIAGNOSIS — M545 Low back pain: Secondary | ICD-10-CM | POA: Diagnosis present

## 2014-10-17 DIAGNOSIS — I5022 Chronic systolic (congestive) heart failure: Secondary | ICD-10-CM | POA: Diagnosis not present

## 2014-10-17 DIAGNOSIS — I739 Peripheral vascular disease, unspecified: Secondary | ICD-10-CM | POA: Diagnosis not present

## 2014-10-17 DIAGNOSIS — Z89611 Acquired absence of right leg above knee: Secondary | ICD-10-CM

## 2014-10-17 DIAGNOSIS — Z7951 Long term (current) use of inhaled steroids: Secondary | ICD-10-CM

## 2014-10-17 DIAGNOSIS — G3184 Mild cognitive impairment, so stated: Secondary | ICD-10-CM | POA: Diagnosis present

## 2014-10-17 DIAGNOSIS — R0602 Shortness of breath: Secondary | ICD-10-CM | POA: Diagnosis not present

## 2014-10-17 DIAGNOSIS — F0390 Unspecified dementia without behavioral disturbance: Secondary | ICD-10-CM | POA: Diagnosis present

## 2014-10-17 DIAGNOSIS — E1122 Type 2 diabetes mellitus with diabetic chronic kidney disease: Secondary | ICD-10-CM

## 2014-10-17 DIAGNOSIS — I2510 Atherosclerotic heart disease of native coronary artery without angina pectoris: Secondary | ICD-10-CM | POA: Diagnosis not present

## 2014-10-17 DIAGNOSIS — Z9981 Dependence on supplemental oxygen: Secondary | ICD-10-CM

## 2014-10-17 DIAGNOSIS — E118 Type 2 diabetes mellitus with unspecified complications: Secondary | ICD-10-CM

## 2014-10-17 DIAGNOSIS — J810 Acute pulmonary edema: Secondary | ICD-10-CM | POA: Diagnosis not present

## 2014-10-17 DIAGNOSIS — J910 Malignant pleural effusion: Secondary | ICD-10-CM | POA: Diagnosis present

## 2014-10-17 DIAGNOSIS — E1151 Type 2 diabetes mellitus with diabetic peripheral angiopathy without gangrene: Secondary | ICD-10-CM | POA: Diagnosis present

## 2014-10-17 DIAGNOSIS — I319 Disease of pericardium, unspecified: Secondary | ICD-10-CM | POA: Diagnosis not present

## 2014-10-17 DIAGNOSIS — Z515 Encounter for palliative care: Secondary | ICD-10-CM

## 2014-10-17 DIAGNOSIS — R911 Solitary pulmonary nodule: Secondary | ICD-10-CM | POA: Diagnosis present

## 2014-10-17 DIAGNOSIS — N184 Chronic kidney disease, stage 4 (severe): Secondary | ICD-10-CM | POA: Diagnosis not present

## 2014-10-17 DIAGNOSIS — I3139 Other pericardial effusion (noninflammatory): Secondary | ICD-10-CM | POA: Insufficient documentation

## 2014-10-17 DIAGNOSIS — R079 Chest pain, unspecified: Secondary | ICD-10-CM | POA: Diagnosis not present

## 2014-10-17 DIAGNOSIS — J189 Pneumonia, unspecified organism: Secondary | ICD-10-CM | POA: Diagnosis present

## 2014-10-17 DIAGNOSIS — I5021 Acute systolic (congestive) heart failure: Secondary | ICD-10-CM | POA: Diagnosis not present

## 2014-10-17 DIAGNOSIS — Z79899 Other long term (current) drug therapy: Secondary | ICD-10-CM | POA: Diagnosis not present

## 2014-10-17 DIAGNOSIS — I5042 Chronic combined systolic (congestive) and diastolic (congestive) heart failure: Secondary | ICD-10-CM | POA: Diagnosis present

## 2014-10-17 DIAGNOSIS — I509 Heart failure, unspecified: Secondary | ICD-10-CM | POA: Diagnosis not present

## 2014-10-17 HISTORY — DX: Paroxysmal atrial fibrillation: I48.0

## 2014-10-17 HISTORY — DX: Pleural effusion, not elsewhere classified: J90

## 2014-10-17 HISTORY — DX: Urinary tract infection, site not specified: N39.0

## 2014-10-17 HISTORY — DX: Reserved for inherently not codable concepts without codable children: IMO0001

## 2014-10-17 LAB — BRAIN NATRIURETIC PEPTIDE: B NATRIURETIC PEPTIDE 5: 298.3 pg/mL — AB (ref 0.0–100.0)

## 2014-10-17 LAB — CBC
HCT: 30.5 % — ABNORMAL LOW (ref 36.0–46.0)
Hemoglobin: 9.9 g/dL — ABNORMAL LOW (ref 12.0–15.0)
MCH: 30.5 pg (ref 26.0–34.0)
MCHC: 32.5 g/dL (ref 30.0–36.0)
MCV: 93.8 fL (ref 78.0–100.0)
PLATELETS: 308 10*3/uL (ref 150–400)
RBC: 3.25 MIL/uL — AB (ref 3.87–5.11)
RDW: 12.6 % (ref 11.5–15.5)
WBC: 9.7 10*3/uL (ref 4.0–10.5)

## 2014-10-17 LAB — BASIC METABOLIC PANEL
Anion gap: 9 (ref 5–15)
BUN: 29 mg/dL — AB (ref 6–20)
CALCIUM: 8.8 mg/dL — AB (ref 8.9–10.3)
CHLORIDE: 98 mmol/L — AB (ref 101–111)
CO2: 29 mmol/L (ref 22–32)
CREATININE: 1.59 mg/dL — AB (ref 0.44–1.00)
GFR calc Af Amer: 32 mL/min — ABNORMAL LOW (ref >60)
GFR calc non Af Amer: 27 mL/min — ABNORMAL LOW (ref >60)
GLUCOSE: 122 mg/dL — AB (ref 65–99)
Potassium: 3.4 mmol/L — ABNORMAL LOW (ref 3.5–5.1)
Sodium: 136 mmol/L (ref 135–145)

## 2014-10-17 LAB — URINALYSIS, ROUTINE W REFLEX MICROSCOPIC
BILIRUBIN URINE: NEGATIVE
GLUCOSE, UA: NEGATIVE mg/dL
Ketones, ur: NEGATIVE mg/dL
NITRITE: NEGATIVE
PH: 5.5 (ref 5.0–8.0)
Protein, ur: NEGATIVE mg/dL
SPECIFIC GRAVITY, URINE: 1.007 (ref 1.005–1.030)
Urobilinogen, UA: 0.2 mg/dL (ref 0.0–1.0)

## 2014-10-17 LAB — GLUCOSE, CAPILLARY: Glucose-Capillary: 162 mg/dL — ABNORMAL HIGH (ref 65–99)

## 2014-10-17 LAB — URINE MICROSCOPIC-ADD ON

## 2014-10-17 LAB — I-STAT TROPONIN, ED: Troponin i, poc: 0.07 ng/mL (ref 0.00–0.08)

## 2014-10-17 LAB — GLUCOSE, POCT (MANUAL RESULT ENTRY): POC Glucose: 197 mg/dl — AB (ref 70–99)

## 2014-10-17 LAB — TROPONIN I: Troponin I: 0.05 ng/mL — ABNORMAL HIGH (ref <0.031)

## 2014-10-17 MED ORDER — FUROSEMIDE 10 MG/ML IJ SOLN
40.0000 mg | Freq: Two times a day (BID) | INTRAMUSCULAR | Status: DC
  Administered 2014-10-17 – 2014-10-18 (×2): 40 mg via INTRAVENOUS
  Filled 2014-10-17 (×4): qty 4

## 2014-10-17 MED ORDER — IPRATROPIUM-ALBUTEROL 20-100 MCG/ACT IN AERS: 2.0000 | INHALATION_SPRAY | Freq: Four times a day (QID) | RESPIRATORY_TRACT | Status: DC | PRN

## 2014-10-17 MED ORDER — BUDESONIDE-FORMOTEROL FUMARATE 160-4.5 MCG/ACT IN AERO
2.0000 | INHALATION_SPRAY | Freq: Two times a day (BID) | RESPIRATORY_TRACT | Status: DC
  Administered 2014-10-17 – 2014-10-24 (×12): 2 via RESPIRATORY_TRACT
  Filled 2014-10-17 (×5): qty 6

## 2014-10-17 MED ORDER — PANTOPRAZOLE SODIUM 40 MG PO TBEC
40.0000 mg | DELAYED_RELEASE_TABLET | Freq: Every day | ORAL | Status: DC
  Administered 2014-10-17 – 2014-10-24 (×8): 40 mg via ORAL
  Filled 2014-10-17 (×8): qty 1

## 2014-10-17 MED ORDER — IPRATROPIUM-ALBUTEROL 0.5-2.5 (3) MG/3ML IN SOLN
3.0000 mL | Freq: Four times a day (QID) | RESPIRATORY_TRACT | Status: DC | PRN
  Administered 2014-10-17 – 2014-10-18 (×2): 3 mL via RESPIRATORY_TRACT
  Filled 2014-10-17: qty 3

## 2014-10-17 MED ORDER — HEPARIN SODIUM (PORCINE) 5000 UNIT/ML IJ SOLN
5000.0000 [IU] | Freq: Three times a day (TID) | INTRAMUSCULAR | Status: DC
  Administered 2014-10-20 – 2014-10-22 (×4): 5000 [IU] via SUBCUTANEOUS
  Filled 2014-10-17 (×18): qty 1

## 2014-10-17 MED ORDER — ASPIRIN EC 325 MG PO TBEC
325.0000 mg | DELAYED_RELEASE_TABLET | Freq: Every day | ORAL | Status: DC
  Administered 2014-10-18 – 2014-10-23 (×6): 325 mg via ORAL
  Filled 2014-10-17 (×11): qty 1

## 2014-10-17 MED ORDER — ALBUTEROL SULFATE (2.5 MG/3ML) 0.083% IN NEBU: 2.5000 mg | INHALATION_SOLUTION | Freq: Three times a day (TID) | RESPIRATORY_TRACT | Status: DC | PRN

## 2014-10-17 MED ORDER — ROSUVASTATIN CALCIUM 20 MG PO TABS
20.0000 mg | ORAL_TABLET | Freq: Every day | ORAL | Status: DC
  Administered 2014-10-18 – 2014-10-20 (×3): 20 mg via ORAL
  Filled 2014-10-17 (×6): qty 1

## 2014-10-17 MED ORDER — FERROUS SULFATE 325 (65 FE) MG PO TABS
325.0000 mg | ORAL_TABLET | Freq: Every day | ORAL | Status: DC
  Administered 2014-10-20 – 2014-10-24 (×5): 325 mg via ORAL
  Filled 2014-10-17 (×11): qty 1

## 2014-10-17 MED ORDER — FAMOTIDINE 10 MG PO TABS
10.0000 mg | ORAL_TABLET | Freq: Every day | ORAL | Status: DC
  Administered 2014-10-17 – 2014-10-24 (×8): 10 mg via ORAL
  Filled 2014-10-17 (×9): qty 1

## 2014-10-17 MED ORDER — INSULIN GLARGINE 100 UNIT/ML ~~LOC~~ SOLN
4.0000 [IU] | Freq: Every day | SUBCUTANEOUS | Status: DC
  Administered 2014-10-17 – 2014-10-20 (×4): 4 [IU] via SUBCUTANEOUS
  Filled 2014-10-17 (×5): qty 0.04

## 2014-10-17 MED ORDER — INSULIN ASPART 100 UNIT/ML ~~LOC~~ SOLN
0.0000 [IU] | Freq: Three times a day (TID) | SUBCUTANEOUS | Status: DC
  Administered 2014-10-18 – 2014-10-19 (×4): 1 [IU] via SUBCUTANEOUS
  Administered 2014-10-20: 2 [IU] via SUBCUTANEOUS

## 2014-10-17 MED ORDER — ACETAMINOPHEN 500 MG PO TABS
1000.0000 mg | ORAL_TABLET | ORAL | Status: DC | PRN
  Administered 2014-10-21: 1000 mg via ORAL
  Filled 2014-10-17 (×2): qty 2

## 2014-10-17 MED ORDER — FUROSEMIDE 40 MG PO TABS: 40.0000 mg | ORAL_TABLET | Freq: Every day | ORAL | Status: DC

## 2014-10-17 MED ORDER — ENOXAPARIN SODIUM 40 MG/0.4ML ~~LOC~~ SOLN: 40.0000 mg | SUBCUTANEOUS | Status: DC

## 2014-10-17 MED ORDER — DEXTROSE 5 % IV SOLN
1.0000 g | Freq: Once | INTRAVENOUS | Status: AC
  Administered 2014-10-17: 1 g via INTRAVENOUS
  Filled 2014-10-17: qty 10

## 2014-10-17 NOTE — Telephone Encounter (Signed)
Dr. Everlene Farrier needs to speak with Dr. Gwenlyn Found

## 2014-10-17 NOTE — ED Provider Notes (Signed)
CSN: [id number]     Arrival date & time 10/17/14  1227 History   First MD Initiated Contact with Patient 10/17/14 1234     Chief Complaint  Patient presents with  . Chest Pain     (Consider location/radiation/quality/duration/timing/severity/associated sxs/prior Treatment) HPI Comments: Patient with a complicated medical history presents from urgent care with shortness of breath. She has a history diabetes mellitus, CHF, hypertension, COPD, peripheral vascular disease status post right leg amputation, aortic aneurysm. With the help of her daughter who gives part of the history, she's been having some increased shortness of breath over the last few days. She's had a cough which is mostly dry. She is normally on 2 L of oxygen at home. One week ago she had some indigestion symptoms with the daughter was concerned about it being cardiac. The patient hasn't been complaining of any chest pain or indigestion since that time. She hasn't had any known fevers. No vomiting or diarrhea. She does report constipation and feels like she has to have the urge to have a bowel movement. She has had some intermittent pain in her abdomen. She was seen by Dr. Everlene Farrier at the urgent care center. He was concerned about some EKG abnormalities associated with the shortness of breath and was sent over here for further evaluation. A rectal exam was done by Dr. Everlene Farrier which did not reveal any evidence of impaction.  Patient is a 81 y.o. female presenting with chest pain.  Chest Pain Associated symptoms: abdominal pain, cough and shortness of breath   Associated symptoms: no back pain, no diaphoresis, no dizziness, no fatigue, no fever, no headache, no nausea, no numbness, not vomiting and no weakness     Past Medical History  Diagnosis Date  . PVD (peripheral vascular disease)     a. PTA & stent right superficial artery PTA & left common iliac artery stenting. Right fem-pop bypass graft. b. Per Dr. Kennon Holter note: h/o  aortobifemoral bypass grafting remotely as well as fem-peroneal bypass grafting.  . Mixed hyperlipidemia   . Compression fx, thoracic spine     T12  . Ischium fracture   . Memory loss, short term   . Pulmonary nodule   . Chronic diastolic CHF (congestive heart failure) 09-2008    a. EF 40% by cath 2010. b. Improved - last echo 2015 with normal EF.  [name] Anemia   . Detached retina 02-2005    R EYE  . Fracture 02-2007    L5 AND PELVIS  . Left ventricular dysfunction     a. EF 40% by cath 2010. b. Improved - last echo 2015 with normal EF.   [name] AAA (abdominal aortic aneurysm)     Repair unknown date  . Critical lower limb ischemia   . Chronic respiratory failure     a. On home O2.  [name] Hypertension   . Warfarin anticoagulation     DC summary from 2010 reports she is "on chronic  Coumadin for vascular disease." This is listed on her med list going back to 2006 by records available. Teaching service H&P remotely says she has h/o DVT but I cannot find any evidence of this. Admission H/P 03/2013 suggests history of atrial fib but all ECGs appear sinus and there is no mention of this previously.  [name] COPD (chronic obstructive pulmonary disease)   . Type II or unspecified type diabetes mellitus without mention of complication, not stated as uncontrolled   . Complication of anesthesia     hard  to wake up, bp problems   . Pneumonia     hx  . GERD (gastroesophageal reflux disease)   . Arthritis   . CAD (coronary artery disease) 10/12/2008    a. 30% LAD,30% CX & 40% tandem mid stenosis in the AV groove, 50-60% RCA  . Renal artery stenosis     a. 1-59% by doppler in 2014.; CKD stage III (Dr. Erling Cruz)  . On home oxygen therapy     "2.5L O2 at sleep or w/increased activity" (09/27/2013)  . History of right above knee amputation     performed by Dr. Gae Gallop August 2014   Past Surgical History  Procedure Laterality Date  . Abdominal aortic aneurysm repair  98    "lower left ventricle"  .  Aorto-femoral bypass graft Bilateral     S/P aortobifemoral bypass grafting and right fem-peroneal bypass grafting remotely  . Cholecystectomy    . Cataract extraction Right 06-2006  . Vascular surgery    . Cardiac catheterization    . Retinal detachment surgery Right     "put buckle in; it popped & they had to remove it"  . Eye surgery    . Patch angioplasty Left 04/19/2013    Procedure: VEIN PATCH ANGIOPLASTY - LEFT COMMON FEMORAL ARTERY;  Surgeon: Angelia Mould, MD;  Location: [place];  Service: Vascular;  Laterality: Left;  . Femoral-popliteal bypass graft Left 04/19/2013    Procedure: BYPASS GRAFT FEMORAL- BELOW KNEE POPLITEAL ARTERY WITH VEIN;  Surgeon: Angelia Mould, MD;  Location: [place];  Service: Vascular;  Laterality: Left;  . Fracture surgery      sternum  2008 no surgery  . Above knee leg amputation Right 09/27/2013  . Femoral-peroneal bypass graft    . Aorto-femoral bypass graft    . Patella fracture surgery Right ~ 1987    "shattered; work related injury"  . Total abdominal hysterectomy    . Amputation Right 09/27/2013    Procedure: AMPUTATION ABOVE KNEE-RIGHT;  Surgeon: Angelia Mould, MD;  Location: [place];  Service: Vascular;  Laterality: Right;  . Lower extremity angiogram Left 04/04/2013    Procedure: LOWER EXTREMITY ANGIOGRAM;  Surgeon: Lorretta Harp, MD;  Location: [facility] CATH LAB;  Service: Cardiovascular;  Laterality: Left;   Family History  Problem Relation Age of Onset  . Tuberculosis Mother   . Cancer Father    Social History  Substance Use Topics  . Smoking status: Former Smoker -- 2.00 packs/day for 40 years    Types: Cigarettes    Quit date: 02/16/1997  . Smokeless tobacco: Never Used  . Alcohol Use: No   OB History    No data available     Review of Systems  Constitutional: Negative for fever, chills, diaphoresis and fatigue.  HENT: Negative for congestion, rhinorrhea and sneezing.   Eyes: Negative.   Respiratory: Positive for  cough and shortness of breath. Negative for chest tightness.   Cardiovascular: Positive for chest pain. Negative for leg swelling.  Gastrointestinal: Positive for abdominal pain and constipation. Negative for nausea, vomiting, diarrhea and blood in stool.  Genitourinary: Negative for frequency, hematuria, flank pain and difficulty urinating.  Musculoskeletal: Negative for back pain and arthralgias.  Skin: Negative for rash.  Neurological: Negative for dizziness, speech difficulty, weakness, numbness and headaches.      Allergies  Codeine; Hydrochlorothiazide; Pregabalin; and Other  Home Medications   Prior to Admission medications   Medication Sig Start Date End Date Taking? Authorizing Provider  acetaminophen (TYLENOL) 500 MG tablet Take 1,000 mg by mouth every 4 (four) hours as needed for moderate pain.    Yes Historical Provider, MD  albuterol (PROVENTIL) (2.5 MG/3ML) 0.083% nebulizer solution Take 2.5 mg by nebulization 3 (three) times daily as needed for wheezing or shortness of breath.    Yes Historical Provider, MD  aspirin EC 325 MG EC tablet Take 1 tablet (325 mg total) by mouth daily. 04/05/13  Yes Dayna N Dunn, PA-C  budesonide-formoterol (SYMBICORT) 160-4.5 MCG/ACT inhaler Inhale 2 puffs into the lungs 2 (two) times daily. 06/29/14  Yes Darlyne Russian, MD  Calcium Carbonate-Vitamin D (CALCIUM 600+D) 600-400 MG-UNIT per tablet Use bid Patient taking differently: Take 1 tablet by mouth 2 (two) times daily. Use bid 12/16/13  Yes Darlyne Russian, MD  CRESTOR 20 MG tablet TAKE 1 TABLET DAILY 10/03/14  Yes Darlyne Russian, MD  esomeprazole (NEXIUM) 40 MG capsule Take 1 capsule (40 mg total) by mouth daily before breakfast. 12/16/13  Yes Darlyne Russian, MD  ferrous sulfate 325 (65 FE) MG tablet Take 1 tablet (325 mg total) by mouth daily with breakfast. 12/16/13  Yes Darlyne Russian, MD  furosemide (LASIX) 40 MG tablet Take 1 tablet (40 mg total) by mouth daily. 12/16/13  Yes Darlyne Russian, MD   Insulin Glargine (LANTUS SOLOSTAR) 100 UNIT/ML Solostar Pen Inject 8 Units into the skin daily. 06/29/14  Yes Darlyne Russian, MD  Ipratropium-Albuterol (COMBIVENT RESPIMAT) 20-100 MCG/ACT AERS respimat Inhale 2 puffs into the lungs every 6 (six) hours as needed for wheezing or shortness of breath. 12/16/13  Yes Darlyne Russian, MD  lidocaine (LIDODERM) 5 % Place 1 patch onto the skin daily. Remove & Discard patch within 12 hours or as directed by MD 06/29/14  Yes Darlyne Russian, MD  Omega-3 Fatty Acids (FISH OIL) 1200 MG CAPS Take 1 capsule (1,200 mg total) by mouth 2 (two) times daily. 06/29/14  Yes Darlyne Russian, MD  ranitidine (ZANTAC) 150 MG tablet Take 1 tablet (150 mg total) by mouth 2 (two) times daily. 12/16/13  Yes Darlyne Russian, MD  Alcohol Swabs (ALCOHOL PREP) 70 % PADS To use before injecting lantus 12/16/13   Darlyne Russian, MD  Insulin Pen Needle (PEN NEEDLES 3/16") 31G X 5 MM MISC To use once daily with LANTUS pens 12/16/13   Darlyne Russian, MD   BP 112/56 mmHg  Pulse 83  Temp(Src) 98 F (36.7 C) (Oral)  Resp 20  SpO2 99% Physical Exam  Constitutional: She is oriented to person, place, and time. She appears well-developed and well-nourished.  HENT:  Head: Normocephalic and atraumatic.  Eyes: Pupils are equal, round, and reactive to light.  Neck: Normal range of motion. Neck supple.  Cardiovascular: Normal rate, regular rhythm and normal heart sounds.   Pulmonary/Chest: Effort normal. No respiratory distress. She has wheezes. She has no rales. She exhibits no tenderness.  Few crackles and wheezes in the lungs bilaterally  Abdominal: Soft. Bowel sounds are normal. There is no tenderness. There is no rebound and no guarding.  Musculoskeletal: Normal range of motion. She exhibits no edema.  Status post right high leg amputation. Her left leg has normal color with a faint but present pedal pulse.  Lymphadenopathy:    She has no cervical adenopathy.  Neurological: She is alert and  oriented to person, place, and time.  Skin: Skin is warm and dry. No rash noted.  Psychiatric: She has a normal  mood and affect.    ED Course  Procedures (including critical care time) Labs Review Labs Reviewed  BASIC METABOLIC PANEL - Abnormal; Notable for the following:    Potassium 3.4 (*)    Chloride 98 (*)    Glucose, Bld 122 (*)    BUN 29 (*)    Creatinine, Ser 1.59 (*)    Calcium 8.8 (*)    GFR calc non Af Amer 27 (*)    GFR calc Af Amer 32 (*)    All other components within normal limits  CBC - Abnormal; Notable for the following:    RBC 3.25 (*)    Hemoglobin 9.9 (*)    HCT 30.5 (*)    All other components within normal limits  BRAIN NATRIURETIC PEPTIDE - Abnormal; Notable for the following:    B Natriuretic Peptide 298.3 (*)    All other components within normal limits  URINALYSIS, ROUTINE W REFLEX MICROSCOPIC (NOT AT [facility]) - Abnormal; Notable for the following:    APPearance CLOUDY (*)    Hgb urine dipstick TRACE (*)    Leukocytes, UA MODERATE (*)    All other components within normal limits  URINE MICROSCOPIC-ADD ON - Abnormal; Notable for the following:    Bacteria, UA MANY (*)    All other components within normal limits  I-STAT TROPOININ, ED    Imaging Review Dg Chest 2 View  10/17/2014   CLINICAL DATA:  Chest pain with cough  EXAM: CHEST  2 VIEW  COMPARISON:  09/08/2013  FINDINGS: Cardiac enlargement. Moderately large left pleural effusion with left lower lobe collapse. Right lung is clear. No right pleural effusion. No definite edema. Vascularity appears normal.  Chronic compression fracture approximately L1 unchanged.  IMPRESSION: Moderately large left effusion with left lower lobe collapse. This could be due to underlying tumor or infection. Consider chest CT for further evaluation. Thoracentesis may also be helpful.   Electronically Signed   By: Franchot Gallo M.D.   On: 10/17/2014 14:07   I have personally reviewed and evaluated these images and lab  results as part of my medical decision-making.   EKG Interpretation   Date/Time:  Tuesday October 17 2014 12:33:36 EDT Ventricular Rate:  86 PR Interval:  164 QRS Duration: 119 QT Interval:  422 QTC Calculation: 505 R Axis:   -41 Text Interpretation:  Sinus rhythm Incomplete left bundle branch block  Baseline wander in lead(s) V6 ?minimal ST elevation V1-V3, as compared to  prior EKG Confirmed by Kamaya Keckler  MD, Elmina Hendel ([id number]) on 10/17/2014 1:18:18 PM      MDM   Final diagnoses:  Pleural effusion    Patient presents with some increased weakness and shortness of breath. On chest x-ray she has a large left pleural effusion. She does have some slight EKG abnormalities but her troponin is negative. Her oxygen saturations are in the upper 90s on her baseline oxygen. Her blood pressure is stable. Patient receives primary care at [facility] urgent care. I will consult the family medicine team for admission.    Malvin Johns, MD 10/17/14 [phone]

## 2014-10-17 NOTE — Telephone Encounter (Signed)
Dr Gwenlyn Found spoke with Dr Everlene Farrier.
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
## 2014-10-17 NOTE — Progress Notes (Signed)
Pt admitted to 2w28 from ED at 1730. Pt alert and responsive. She denies any pain at admission. Oriented pt to room and use of call light. Explained importance of calling for assistance at all times as needed. Pt verbalized understanding. She was placed on fall precautions due to high fall risk on assessment. Bed alarm activated.

## 2014-10-17 NOTE — ED Notes (Signed)
Pt. Presents from [facility] with complaint of CP starting 2 days ago. Pt. Denies pain today. Pt. Went to [facility] for increased SOB with exertion. Pt. On 2L O2 at home. Pt. With hx of CHF.

## 2014-10-18 ENCOUNTER — Encounter (HOSPITAL_COMMUNITY): Payer: Self-pay | Admitting: General Practice

## 2014-10-18 ENCOUNTER — Inpatient Hospital Stay (HOSPITAL_COMMUNITY): Payer: Medicare Other

## 2014-10-18 DIAGNOSIS — J189 Pneumonia, unspecified organism: Secondary | ICD-10-CM

## 2014-10-18 DIAGNOSIS — J810 Acute pulmonary edema: Secondary | ICD-10-CM

## 2014-10-18 DIAGNOSIS — K5900 Constipation, unspecified: Secondary | ICD-10-CM | POA: Insufficient documentation

## 2014-10-18 DIAGNOSIS — R079 Chest pain, unspecified: Secondary | ICD-10-CM | POA: Insufficient documentation

## 2014-10-18 DIAGNOSIS — I509 Heart failure, unspecified: Secondary | ICD-10-CM

## 2014-10-18 DIAGNOSIS — R0602 Shortness of breath: Secondary | ICD-10-CM | POA: Insufficient documentation

## 2014-10-18 DIAGNOSIS — N390 Urinary tract infection, site not specified: Secondary | ICD-10-CM

## 2014-10-18 DIAGNOSIS — E118 Type 2 diabetes mellitus with unspecified complications: Secondary | ICD-10-CM | POA: Insufficient documentation

## 2014-10-18 DIAGNOSIS — R0902 Hypoxemia: Secondary | ICD-10-CM

## 2014-10-18 DIAGNOSIS — I5021 Acute systolic (congestive) heart failure: Secondary | ICD-10-CM

## 2014-10-18 LAB — GLUCOSE, CAPILLARY
GLUCOSE-CAPILLARY: 125 mg/dL — AB (ref 65–99)
Glucose-Capillary: 129 mg/dL — ABNORMAL HIGH (ref 65–99)
Glucose-Capillary: 95 mg/dL (ref 65–99)
Glucose-Capillary: 162 mg/dL — ABNORMAL HIGH (ref 65–99)

## 2014-10-18 LAB — LIPID PANEL
Cholesterol: 104 mg/dL (ref 0–200)
HDL: 49 mg/dL (ref >40)
LDL CALC: 35 mg/dL (ref 0–99)
Total CHOL/HDL Ratio: 2.1 RATIO
Triglycerides: 102 mg/dL (ref <150)
VLDL: 20 mg/dL (ref 0–40)

## 2014-10-18 LAB — BASIC METABOLIC PANEL
ANION GAP: 12 (ref 5–15)
Anion gap: 12 (ref 5–15)
BUN: 27 mg/dL — ABNORMAL HIGH (ref 6–20)
BUN: 27 mg/dL — ABNORMAL HIGH (ref 6–20)
CHLORIDE: 97 mmol/L — AB (ref 101–111)
CO2: 29 mmol/L (ref 22–32)
CO2: 29 mmol/L (ref 22–32)
Calcium: 8.6 mg/dL — ABNORMAL LOW (ref 8.9–10.3)
Calcium: 9 mg/dL (ref 8.9–10.3)
Chloride: 100 mmol/L — ABNORMAL LOW (ref 101–111)
Creatinine, Ser: 1.65 mg/dL — ABNORMAL HIGH (ref 0.44–1.00)
Creatinine, Ser: 1.75 mg/dL — ABNORMAL HIGH (ref 0.44–1.00)
GFR calc Af Amer: 30 mL/min — ABNORMAL LOW (ref >60)
GFR calc Af Amer: 28 mL/min — ABNORMAL LOW (ref >60)
GFR calc non Af Amer: 26 mL/min — ABNORMAL LOW (ref >60)
GFR calc non Af Amer: 24 mL/min — ABNORMAL LOW (ref >60)
GLUCOSE: 142 mg/dL — AB (ref 65–99)
Glucose, Bld: 98 mg/dL (ref 65–99)
POTASSIUM: 3.2 mmol/L — AB (ref 3.5–5.1)
Potassium: 3.6 mmol/L (ref 3.5–5.1)
Sodium: 138 mmol/L (ref 135–145)
Sodium: 141 mmol/L (ref 135–145)

## 2014-10-18 LAB — CBC
HEMATOCRIT: 30.7 % — AB (ref 36.0–46.0)
HEMOGLOBIN: 9.5 g/dL — AB (ref 12.0–15.0)
MCH: 29.1 pg (ref 26.0–34.0)
MCHC: 30.9 g/dL (ref 30.0–36.0)
MCV: 93.9 fL (ref 78.0–100.0)
Platelets: 312 10*3/uL (ref 150–400)
RBC: 3.27 MIL/uL — ABNORMAL LOW (ref 3.87–5.11)
RDW: 12.6 % (ref 11.5–15.5)
WBC: 7.8 10*3/uL (ref 4.0–10.5)

## 2014-10-18 LAB — TROPONIN I
TROPONIN I: 0.05 ng/mL — AB (ref <0.031)
TROPONIN I: 0.05 ng/mL — AB (ref <0.031)
Troponin I: 0.05 ng/mL — ABNORMAL HIGH (ref <0.031)
Troponin I: 0.06 ng/mL — ABNORMAL HIGH (ref <0.031)

## 2014-10-18 LAB — TSH: TSH: 2.72 u[IU]/mL (ref 0.350–4.500)

## 2014-10-18 LAB — MAGNESIUM: Magnesium: 1.8 mg/dL (ref 1.7–2.4)

## 2014-10-18 MED ORDER — IPRATROPIUM-ALBUTEROL 0.5-2.5 (3) MG/3ML IN SOLN
3.0000 mL | Freq: Four times a day (QID) | RESPIRATORY_TRACT | Status: DC
  Administered 2014-10-18 – 2014-10-22 (×18): 3 mL via RESPIRATORY_TRACT
  Filled 2014-10-18 (×19): qty 3

## 2014-10-18 MED ORDER — FUROSEMIDE 40 MG PO TABS
40.0000 mg | ORAL_TABLET | Freq: Every day | ORAL | Status: DC
  Administered 2014-10-19 – 2014-10-24 (×6): 40 mg via ORAL
  Filled 2014-10-18 (×13): qty 1

## 2014-10-18 MED ORDER — POTASSIUM CHLORIDE CRYS ER 20 MEQ PO TBCR
40.0000 meq | EXTENDED_RELEASE_TABLET | Freq: Once | ORAL | Status: AC
  Administered 2014-10-18: 40 meq via ORAL
  Filled 2014-10-18: qty 2

## 2014-10-18 MED ORDER — DILTIAZEM HCL 100 MG IV SOLR
5.0000 mg/h | INTRAVENOUS | Status: DC
  Administered 2014-10-18: 5 mg/h via INTRAVENOUS
  Filled 2014-10-18 (×2): qty 100

## 2014-10-18 MED ORDER — CARVEDILOL 6.25 MG PO TABS
6.2500 mg | ORAL_TABLET | Freq: Two times a day (BID) | ORAL | Status: DC
  Administered 2014-10-19 – 2014-10-20 (×3): 6.25 mg via ORAL
  Filled 2014-10-18 (×7): qty 1

## 2014-10-18 MED ORDER — DEXTROSE 5 % IV SOLN
1.0000 g | INTRAVENOUS | Status: DC
  Administered 2014-10-18: 1 g via INTRAVENOUS
  Filled 2014-10-18 (×3): qty 10

## 2014-10-18 MED ORDER — BISACODYL 10 MG RE SUPP: 10.0000 mg | Freq: Every day | RECTAL | Status: DC | PRN

## 2014-10-18 MED ORDER — ALBUTEROL SULFATE (2.5 MG/3ML) 0.083% IN NEBU: 2.5000 mg | INHALATION_SOLUTION | RESPIRATORY_TRACT | Status: DC | PRN

## 2014-10-18 NOTE — Consult Note (Signed)
CARDIOLOGY CONSULT NOTE   Patient ID: Suzanne [name] MRN: [id number], DOB/AGE: 1921-11-25   Admit date: 10/17/2014 Date of Consult: 10/18/2014 Reason for Consult: Atrial Fibrillation   Primary Physician: Jenny Reichmann, MD Primary Cardiologist: Dr. Gwenlyn Found  HPI : Suzanne [name] is a 81 y.o. female with past medical history of CAD (last cath 42/6834), Diastolic CHF, COPD (on home O2 nightly), CKD Stage 3, HTN, HLD,GERD, DM and PVD (left femoral popliteal bypass graft 04/2013; s/p R AKA 09/2013) who presented to the ED from [facility] on 10/17/2014 for increased shortness of breath with rest over the past two weeks.   Also reported having one episode of burning chest pain while at rest the week prior which was mostly relieved with consuming Ginger Ale and belching. She did have a burning sensation in her chest for the rest of the day. No chest pain since.  CXR performed in the ED showed a large left pleural effusion. BNP was elevated to 298.3 at and troponin was at 0.05. Patient had a chest CT on 10/17/14 which showed a large left hilar pulmonary mass which pulmonology thinks is likely cancer. They will not proceed with bronchoscopy as patient does not want to undergo chemo or radiation.  Patient had an Echo on 10/18/2014 which showed EF of 35-40% with Grade 1 Diastolic Dysfunction. Her prior Echo was in 03/2013 and showed an EF of 65-70%.   Noted to be in atrial fibrillation with RVR around 1:00PM on 10/18/2014. Rate was in the 130's - 140's. Patient felt fatigued at the time and had increased shortness of breath. Denies any chest pain or palpitations.  Cardizem drip was started and her rate is now in the 100's - 110's.   With her Right AKA amputation she mostly uses a wheelchair for ambulation. Denies any recent falls.  Problem List Past Medical History  Diagnosis Date  . PVD (peripheral vascular disease)     a. PTA & stent right superficial artery PTA & left common iliac artery  stenting. Right fem-pop bypass graft. b. Per Dr. Kennon Holter note: h/o aortobifemoral bypass grafting remotely as well as fem-peroneal bypass grafting.  . Mixed hyperlipidemia   . Compression fx, thoracic spine     T12  . Ischium fracture   . Memory loss, short term   . Pulmonary nodule   . Chronic diastolic CHF (congestive heart failure) 09-2008    a. EF 40% by cath 2010. b. Improved - last echo 2015 with normal EF.  [name] Anemia   . Detached retina 02-2005    R EYE  . Fracture 02-2007    L5 AND PELVIS  . Left ventricular dysfunction     a. EF 40% by cath 2010. b. Improved - last echo 2015 with normal EF.   [name] AAA (abdominal aortic aneurysm)     Repair unknown date  . Critical lower limb ischemia   . Chronic respiratory failure     a. On home O2.  [name] Hypertension   . Warfarin anticoagulation     DC summary from 2010 reports she is "on chronic  Coumadin for vascular disease." This is listed on her med list going back to 2006 by records available. Teaching service H&P remotely says she has h/o DVT but I cannot find any evidence of this. Admission H/P 03/2013 suggests history of atrial fib but all ECGs appear sinus and there is no mention of this previously.  [name] COPD (chronic obstructive pulmonary disease)   .  Type II or unspecified type diabetes mellitus without mention of complication, not stated as uncontrolled   . Complication of anesthesia     hard to wake up, bp problems   . Pneumonia     hx  . GERD (gastroesophageal reflux disease)   . Arthritis   . CAD (coronary artery disease) 10/12/2008    a. 30% LAD,30% CX & 40% tandem mid stenosis in the AV groove, 50-60% RCA  . Renal artery stenosis     a. 1-59% by doppler in 2014.; CKD stage III (Dr. Erling Cruz)  . On home oxygen therapy     "2.5L O2 at sleep or w/increased activity" (09/27/2013)  . History of right above knee amputation     performed by Dr. Gae Gallop August 2014  . Pleural effusion 09/2014  . Shortness of breath dyspnea     . UTI (urinary tract infection)     Past Surgical History  Procedure Laterality Date  . Abdominal aortic aneurysm repair  98    "lower left ventricle"  . Aorto-femoral bypass graft Bilateral     S/P aortobifemoral bypass grafting and right fem-peroneal bypass grafting remotely  . Cholecystectomy    . Cataract extraction Right 06-2006  . Vascular surgery    . Cardiac catheterization    . Retinal detachment surgery Right     "put buckle in; it popped & they had to remove it"  . Eye surgery    . Patch angioplasty Left 04/19/2013    Procedure: VEIN PATCH ANGIOPLASTY - LEFT COMMON FEMORAL ARTERY;  Surgeon: Angelia Mould, MD;  Location: [place];  Service: Vascular;  Laterality: Left;  . Femoral-popliteal bypass graft Left 04/19/2013    Procedure: BYPASS GRAFT FEMORAL- BELOW KNEE POPLITEAL ARTERY WITH VEIN;  Surgeon: Angelia Mould, MD;  Location: [place];  Service: Vascular;  Laterality: Left;  . Fracture surgery      sternum  2008 no surgery  . Above knee leg amputation Right 09/27/2013  . Femoral-peroneal bypass graft    . Aorto-femoral bypass graft    . Patella fracture surgery Right ~ 1987    "shattered; work related injury"  . Total abdominal hysterectomy    . Amputation Right 09/27/2013    Procedure: AMPUTATION ABOVE KNEE-RIGHT;  Surgeon: Angelia Mould, MD;  Location: [place];  Service: Vascular;  Laterality: Right;  . Lower extremity angiogram Left 04/04/2013    Procedure: LOWER EXTREMITY ANGIOGRAM;  Surgeon: Lorretta Harp, MD;  Location: [facility] C[facility];  Service: Cardiovascular;  Laterality: Left;     Allergies Allergies  Allergen Reactions  . Codeine Other (See Comments)    REACTION: hallucinations  . Hydrochlorothiazide Other (See Comments)    REACTION: hypotension  . Pregabalin Other (See Comments)    REACTION: unknown  . Other Rash    Adhesives and bandages gives pt a rash.      Inpatient Medications . aspirin EC  325 mg Oral Daily  .  budesonide-formoterol  2 puff Inhalation BID  . cefTRIAXone (ROCEPHIN)  IV  1 g Intravenous Q24H  . famotidine  10 mg Oral Daily  . ferrous sulfate  325 mg Oral Q breakfast  . [START ON 10/19/2014] furosemide  40 mg Oral Daily  . heparin subcutaneous  5,000 Units Subcutaneous 3 times per day  . insulin aspart  0-9 Units Subcutaneous TID WC  . insulin glargine  4 Units Subcutaneous QHS  . ipratropium-albuterol  3 mL Nebulization Q6H  . pantoprazole  40  mg Oral Daily  . rosuvastatin  20 mg Oral Daily    Family History Family History  Problem Relation Age of Onset  . Tuberculosis Mother   . Cancer Father      Social History Social History   Social History  . Marital Status: Widowed    Spouse Name: N/A  . Number of Children: N/A  . Years of Education: N/A   Occupational History  . Not on file.   Social History Main Topics  . Smoking status: Former Smoker -- 2.00 packs/day for 40 years    Types: Cigarettes    Quit date: 02/16/1997  . Smokeless tobacco: Never Used  . Alcohol Use: No  . Drug Use: No  . Sexual Activity: No   Other Topics Concern  . Not on file   Social History Narrative   Lives by self, cares for self, drives.       Review of Systems  General:  No chills, fever, night sweats or weight changes.  Cardiovascular:  No chest pain, edema, orthopnea, palpitations, paroxysmal nocturnal dyspnea. Positive for dyspnea. Dermatological: No rash, lesions/masses Respiratory: Positive for cough and dyspnea Urologic: No hematuria, dysuria Abdominal:   No nausea, vomiting, diarrhea, bright red blood per rectum, melena, or hematemesis Neurologic:  No visual changes, wkns, changes in mental status. All other systems reviewed and are otherwise negative except as noted above.  Physical Exam Blood pressure 119/62, pulse 79, temperature 98 F (36.7 C), temperature source Oral, resp. rate 18, height '5\' 2"'$  (1.575 m), weight 119 lb 11.2 oz (54.296 kg), SpO2 96 %.  General:  Pleasant, Caucasian female in  NAD Psych: Normal affect. Neuro: Alert and oriented X 3. Moves all extremities spontaneously. HEENT: Normal  Neck: Supple without bruits or JVD. Lungs:  Resp regular and unlabored, Decreased breath sounds at left base. Heart: Irregularly irregular, no s3, s4, or murmurs. Abdomen: Soft, non-tender, non-distended, BS + x 4.  Extremities: No clubbing, cyanosis or edema. DP/PT/Radials 2+ on left lower extremity. AKA on right.  Labs   Recent Labs  10/17/14 1634 10/17/14 2308 10/18/14 0606  TROPONINI 0.05* 0.05* 0.05*   Lab Results  Component Value Date   WBC 7.8 10/18/2014   HGB 9.5* 10/18/2014   HCT 30.7* 10/18/2014   MCV 93.9 10/18/2014   PLT 312 10/18/2014    Recent Labs Lab 10/18/14 0606  NA 138  K 3.2*  CL 97*  CO2 29  BUN 27*  CREATININE 1.65*  CALCIUM 8.6*  GLUCOSE 142*   Lab Results  Component Value Date   CHOL 104 10/18/2014   HDL 49 10/18/2014   LDLCALC 35 10/18/2014   TRIG 102 10/18/2014    Radiology/Studies  Dg Chest 2 View: 10/17/2014   CLINICAL DATA:  Chest pain with cough  EXAM: CHEST  2 VIEW  COMPARISON:  09/08/2013  FINDINGS: Cardiac enlargement. Moderately large left pleural effusion with left lower lobe collapse. Right lung is clear. No right pleural effusion. No definite edema. Vascularity appears normal.  Chronic compression fracture approximately L1 unchanged.  IMPRESSION: Moderately large left effusion with left lower lobe collapse. This could be due to underlying tumor or infection. Consider chest CT for further evaluation. Thoracentesis may also be helpful.   Electronically Signed   By: Franchot Gallo M.D.   On: 10/17/2014 14:07   Ct Chest Wo Contrast: 10/17/2014   CLINICAL DATA:  LEFT pleural effusion  EXAM: CT CHEST WITHOUT CONTRAST  TECHNIQUE: Multidetector CT imaging of the chest was performed  following the standard protocol without IV contrast. Sagittal and coronal MPR images reconstructed from axial data set.   COMPARISON:  03/15/2009; correlation chest radiograph 10/17/2014  FINDINGS: Scattered atherosclerotic calcifications aorta, proximal great vessels, and coronary arteries.  Aneurysmal dilatation ascending thoracic aorta 4.1 x 4.1 cm image 26.  Small pericardial effusion.  Upper normal caliber main pulmonary artery.  Moderate LEFT pleural effusion.  Hila poorly assessed due to lack of IV contrast but abnormal soft tissue is seen at the LEFT hilum and AP window extending into the subcarinal region either representing tumor adenopathy.  This appearance represents an interval change since the prior CT.  Overall of the area of abnormal soft tissue measures up to 6.8 x 5.0 x 6.5 cm.  No definite pleural nodularity identified.  Visualized upper abdomen unremarkable.  Emphysematous changes greatest at apices.  Complete atelectasis of LEFT lower lobe with occlusion of LEFT lower lobe bronchus at its origin.  Scattered tracheobronchial cartilaginous calcification.  6 mm RIGHT lung base nodule image 35 stable since 2011.  No infiltrate or pneumothorax.  Mild superior endplate compression fracture of a lower thoracic vertebra unchanged since 2011.  Degenerative disc disease changes lower thoracic spine.  IMPRESSION: Abnormal soft tissue at the LEFT pulmonary hilum extending into the mediastinum and LEFT lower lobe with occlusion of the LEFT lower lobe bronchus at its origin, high suspicious for tumor though portions this may represent adenopathy.  Consider bronchoscopy.  Associated moderate LEFT pleural effusion with atelectasis of LEFT lower lobe.  Small pericardial effusion with extensive atherosclerotic disease changes and mild aneurysmal dilatation of the ascending thoracic aorta, recommendation below.  Recommend annual imaging followup by CTA or MRA. This recommendation follows 2010 ACCF/AHA/AATS/ACR/ASA/SCA/SCAI/SIR/STS/SVM Guidelines for the Diagnosis and Management of Patients with Thoracic Aortic Disease. Circulation.  2010; 121: [id number]   Electronically Signed   By: Lavonia Dana M.D.   On: 10/17/2014 16:35    ECHO:  Study Conclusions - Left ventricle: The cavity size was normal. Wall thickness was increased in a pattern of mild LVH. Systolic function was moderately reduced. The estimated ejection fraction was in the range of 35% to 40%. Doppler parameters are consistent with abnormal left ventricular relaxation (grade 1 diastolic dysfunction). - Aortic valve: Mildly calcified annulus. Valve area (VTI): 1.58 cm^2. Valve area (Vmax): 1.52 cm^2. Valve area (Vmean): 1.73 cm^2. - Mitral valve: Moderately calcified annulus. Mildly thickened, mildly calcified leaflets . Valve area by continuity equation (using LVOT flow): 1.14 cm^2. - Pericardium, extracardiac: A moderate pericardial effusion was identified.   ASSESSMENT AND PLAN  1. New Onset Atrial Fibrillation with RVR - no prior history of atrial fibrillation according to the patient and her daughter. Was on Coumadin in the past for PAD but has not taken any anticoagulation medications in years besides daily '325mg'$  ASA. - rate initially in the 130's -140's but placed on Diltiazem drip and converted back to NSR. Will discontinue Cardizem drip. Will start Coreg 6.'25mg'$  BID and titrate up as tolerated. - This patients CHA2DS2-VASc Score and unadjusted Ischemic Stroke Rate (% per year) is equal to 9.7 % stroke rate/year from a score of 6 Above score calculated as 1 point each if present [CHF, HTN, DM, Vascular=MI/PAD/Aortic Plaque, Age if 65-74, or Female] Above score calculated as 2 points each if present [Age > 75, or Stroke/TIA/TE] - At increased risk of cardioembolic events but with advanced age, transferring in and out of wheelchair at home, and new lung mass concerning for malignancy, would be very hesitant  to start on anticoagulation besides her '325mg'$  ASA she had been taking PTA.   2. EKG Abnormality in setting of atypical chest  pain and history of CAD - one episode of atypical chest pain last week that was relieved with Ginger Ale and belching. No reoccurrence of pain since. - EKG on admission showed  new LBBB. LBBB had also been previously noted on EKG in 08/2013. - Echo on 10/18/2014 showed of EF of 35-40% with Grade 1 Diastolic Dysfunction. Her prior Echo was in 03/2013 and showed an EF of 65-70%.  - cyclic troponin values have been stable at 0.05. Flat trend is likely in setting of acute congestive heart failure and Stage 3 CKD.  - In light of new lung mass that is probably CA and patients request for no futher w/u or treatment of mass and advanced age and frail state,  would not proceed with further cardiac w/u of CP  3. Acute Systolic Congestive Heart Failure - Echo on 10/18/2014 showed EF of 35-40% with Grade 1 Diastolic Dysfunction. Her prior Echo was in 03/2013 and showed an EF of 65-70%.  - BNP elevated to 298 on admission. Was on Lasix '40mg'$  PO daily PTA and this has been resumed. - Does not appear volume overloaded on physical exam.  - Coreg 6.'25mg'$  BID started. Titrate up as BP and HR allow. - No ACE/ARB due to CKD. Consider adding Hydralazine or Nitrates as BP can tolerate.  4. PVD - s/p AKA 09/2013 and Left femoral popliteal bypass graft in  04/2013  5. COPD - on 2.5L O2 nightly at home. - per admitting team  6. CKD Stage 3 - creatinine 1.59 on admission. Her creatinine has not been this low since 03/2013 and was most recently 1.96 in 06/2014.  7. Left large pleural effusion - thoracentesis planned today by IR - per admitting team  8. Large Left Pulmonary Hilum Mass on CT - pulmonology team feels this is likely cancer - will not proceed with bronchoscopy as patient does not want chemo/radiation  9. HLD - continue statin therapy  10.  Moderate pericardial effusion with no tamponade.  In setting of hilar mass, malignant effusion is of concern.  Patient does not wish anything further be done about  lung mass so will not proceed with diagnostic pericardiocentesis since she does not want treatment for probable lung CA at this time.  Signed, Dineen Kid, PA-C 10/18/2014, 5:38 PM Pager: [phone]

## 2014-10-18 NOTE — Progress Notes (Signed)
FPTS Interim Progress Note  S: Called to see patient for possible atrial fibrillation noted on telemetry by RN; HR in the 130s-140s at that time. At bedside, patient denies chest pain, but endorses new-onset chest pressure with nausea and abdominal discomfort. Also notes increased anxiety.  O: BP 119/62 mmHg  Pulse 79  Temp(Src) 98 F (36.7 C) (Oral)  Resp 18  Ht '5\' 2"'$  (1.575 m)  Wt 54.296 kg (119 lb 11.2 oz)  BMI 21.89 kg/m2  SpO2 96%   Physical Exam: General: Patient resting in bed, in no acute distress. Apparently anxious. Cardiovascular: Irregularly irregular rhythm with rate in the 120s-130s. No murmurs/rubs/gallops appreciated.  EKG (8/31, 12:13): Absence of p waves noted with ventricular rate 89. No ischemic changes present. Nonspecific T wave changes.  A/P: Atrial Fibrillation with RVR: Patient with new-onset a fib with RVR that was not present on previous EKGs. No prior history of atrial fibrillation found in EMR or reported by patient/daughter. Likely secondary to increased stress secondary to volume overload and acute illness. - Cardiology consulted - Diltiazem drip - Telemetry - Trend troponins - STAT BMET, Mag  Orion Crook, Med Student 10/18/2014, 3:24 PM MS4, [facility] pager [phone]  RESIDENT ADDENDUM  I have separately seen and examined the patient. I have discussed the findings and exam with the medical student and agree with the above note, which I have edited appropriately. I helped develop the management plan that is described in the student's note, and I agree with the content.  Virginia Crews, MD PGY-2,  [facility] 10/18/2014  3:45 PM

## 2014-10-18 NOTE — Progress Notes (Signed)
Utilization review completed.  

## 2014-10-18 NOTE — Progress Notes (Signed)
Family Medicine Teaching Service Daily Progress Note Intern Pager: [phone]  Patient name: Suzanne [name] Medical record number: [id number] Date of birth: 10/15/1921 Age: 81 y.o. Gender: female  Primary Care Provider: Jenny Reichmann, MD Consultants: Interventional Radiology, Pulmonology Code Status: DNR  Pt Overview and Major Events to Date:  8/30: Admitted with SOB. CXR with left effusion with left lower lobe collapse; CT notable for abnormal soft tissue at the left pulmonary hilum extending into the mediastinum.  Assessment and Plan:  Suzanne [name] is a 81 y.o. female presenting with two weeks of progressive shortness of breath and imaging concerning for large left-sided effusion and an abnormal soft tissue mass at the left pulmonary hilum. PMH is significant for CHF, COPD, HTN, DM, aortic aneurysm, and peripheral vascular disease s/p R leg AKA.  Shortness of Breath: Patient with COPD and 80 pack year smoking history, now presents with progressive shortness of breath and fatigue. CXR notable for a moderately large left-sided pleural effusion with left lower lobe collapse. CT notable for abnormal soft tissue at the left pulmonary hilum extending into the mediastinum and left lower lobe with occlusion of the left lower lobe bronchus at the origin. Per radiology, high suspicion for tumor, though this mass may also represent adenopathy. Continues to be afebrile, radiologic changes not consistent with pneumonia. Not improving with Lasix administration and effusion is one-sided, making CHF exacerbation less likely. - Pulmonology consult for possible bronchoscopy - Thoracocentesis today per IR - Continue home Symbicort - Patient with increased wheezes on exam today, will schedule Duoneb q6h - Albuterol q2h PRN - Reduce Lasix to home 40 mg PO daily - Daily CBC, CMP - On 2L O2 at present and sating well, home O2 2.5L. Will titrate up as necessary.  Chest Pain: Patient with history of CAD and CHF.  Description of chest pain one week ago is atypical and inconsistent with cardiac pain. She is without pain at present. EKG with mild ST elevation in the anteroseptal leads with new LBBB. Troponins 0.05 persistently overnight. Would suspect GERD as likely diagnosis of previous episode, but will assess for cardiac etiology considering new EKG changes. - Repeat ECHO today - Repeat EKG - Risk stratification with TSH, Hemoglobin A1c, and lipid panel pending - ASA - Continue home pepcid and protonix  Constipation: Patient with new-onset constipation and tenesmus. Reports passing small, darkened, pencil-thin stools over recent months. Also with mass (?stool burden) on physical exam. Most recent colonoscopy 12 years ago. - Abdominal CT w/o contrast today - Dulcolax enema PRN  Hypokalemia: K 3.2 today, from 4.3 three months ago. - Will replete  DM: On Lantus 4U qhs at home. - Continue home Lantus - SSI  HLD: On Crestor 20 mg daily at home. - Continue home Crestor  CKD Stage III: History of 80% renal artery stenosis found at time of remote angiography (02/02/1998) that has been followed by Dr. Gwenlyn Found and has been stable on repeat renal Doppler studies. Creatinine on admission 1.59, which appears to be near her baseline.  FEN/GI: NPO until s/p thoracocentesis PPx: Heparin per pharmacy  Disposition: Continue inpatient admission pending improvement in clinical status.  Subjective:  Patient reports feeling well overnight, though with continued shortness of breath that is made worse with speaking and traveling to the bedside commode. Continued mild productive cough that is unchanged from her baseline. Denies subjective fever/chills. Denies chest pain.  No bowel movement overnight. On further questioning, reports several months of constipation. She only occasionally passes small,  dark brown/black, pencil-thin stools. Also endorses tenesmus.  Objective: Temp:  [97.5 F (36.4 C)-98.8 F (37.1 C)]  97.5 F (36.4 C) (08/31 4193) Pulse Rate:  [64-93] 93 (08/31 0632) Resp:  [16-24] 19 (08/31 0632) BP: (102-130)/(53-99) 130/62 mmHg (08/31 0632) SpO2:  [92 %-100 %] 96 % (08/31 0632) Weight:  [54.296 kg (119 lb 11.2 oz)] 54.296 kg (119 lb 11.2 oz) (08/30 1724)  Physical Exam: General: Patient resting comfortably in bed, in no acute distress. Cardiovascular: Regular rate and rhythm. 3/6 holosystolic murmur present diffusely. Radial pulses 2+ bilaterally. Respiratory: Normal work of breathing at rest, increased work of breathing with speech. End-expiratory wheezes present diffusely. Decreased breath sounds over the mid and lower left lung. Mild crackles diffusely, more pronounced on the left. Abdomen: Soft, non-tender and non-distended. +BS. Notably, 5-6 cm mass (?stool burden) appreciated today in the LLQ, extending into the pelvis. Extremities: R AKA. No edema or calf tenderness in the LLE.  Laboratory:  Recent Labs Lab 10/17/14 1402 10/18/14 0606  WBC 9.7 7.8  HGB 9.9* 9.5*  HCT 30.5* 30.7*  PLT 308 312    Recent Labs Lab 10/17/14 1402 10/18/14 0606  NA 136 138  K 3.4* 3.2*  CL 98* 97*  CO2 29 29  BUN 29* 27*  CREATININE 1.59* 1.65*  CALCIUM 8.8* 8.6*  GLUCOSE 122* 142*    Troponin I: 0.05 x3 TSH, lipid panel, and A1c: Pending  Imaging/Diagnostic Tests: Dg Chest 2 View  10/17/2014   CLINICAL DATA:  Chest pain with cough  EXAM: CHEST  2 VIEW  COMPARISON:  09/08/2013  FINDINGS: Cardiac enlargement. Moderately large left pleural effusion with left lower lobe collapse. Right lung is clear. No right pleural effusion. No definite edema. Vascularity appears normal.  Chronic compression fracture approximately L1 unchanged.  IMPRESSION: Moderately large left effusion with left lower lobe collapse. This could be due to underlying tumor or infection. Consider chest CT for further evaluation. Thoracentesis may also be helpful.   Electronically Signed   By: Franchot Gallo M.D.   On:  10/17/2014 14:07   Ct Chest Wo Contrast  10/17/2014   CLINICAL DATA:  LEFT pleural effusion  EXAM: CT CHEST WITHOUT CONTRAST  TECHNIQUE: Multidetector CT imaging of the chest was performed following the standard protocol without IV contrast. Sagittal and coronal MPR images reconstructed from axial data set.  COMPARISON:  03/15/2009; correlation chest radiograph 10/17/2014  FINDINGS: Scattered atherosclerotic calcifications aorta, proximal great vessels, and coronary arteries.  Aneurysmal dilatation ascending thoracic aorta 4.1 x 4.1 cm image 26.  Small pericardial effusion.  Upper normal caliber main pulmonary artery.  Moderate LEFT pleural effusion.  Hila poorly assessed due to lack of IV contrast but abnormal soft tissue is seen at the LEFT hilum and AP window extending into the subcarinal region either representing tumor adenopathy.  This appearance represents an interval change since the prior CT.  Overall of the area of abnormal soft tissue measures up to 6.8 x 5.0 x 6.5 cm.  No definite pleural nodularity identified.  Visualized upper abdomen unremarkable.  Emphysematous changes greatest at apices.  Complete atelectasis of LEFT lower lobe with occlusion of LEFT lower lobe bronchus at its origin.  Scattered tracheobronchial cartilaginous calcification.  6 mm RIGHT lung base nodule image 35 stable since 2011.  No infiltrate or pneumothorax.  Mild superior endplate compression fracture of a lower thoracic vertebra unchanged since 2011.  Degenerative disc disease changes lower thoracic spine.  IMPRESSION: Abnormal soft tissue at the LEFT pulmonary  hilum extending into the mediastinum and LEFT lower lobe with occlusion of the LEFT lower lobe bronchus at its origin, high suspicious for tumor though portions this may represent adenopathy.  Consider bronchoscopy.  Associated moderate LEFT pleural effusion with atelectasis of LEFT lower lobe.  Small pericardial effusion with extensive atherosclerotic disease changes  and mild aneurysmal dilatation of the ascending thoracic aorta, recommendation below.  Recommend annual imaging followup by CTA or MRA. This recommendation follows 2010 ACCF/AHA/AATS/ACR/ASA/SCA/SCAI/SIR/STS/SVM Guidelines for the Diagnosis and Management of Patients with Thoracic Aortic Disease. Circulation. 2010; 121: [id number]   Electronically Signed   By: Lavonia Dana M.D.   On: 10/17/2014 16:35    Orion Crook, Med Student 10/18/2014, 7:39 AM MS4, [place] Intern pager: [phone], text pages welcome   RESIDENT ADDENDUM  I have separately seen and examined the patient. I have discussed the findings and exam with the medical student and agree with the above note, which I have edited appropriately. I helped develop the management plan that is described in the student's note, and I agree with the content.   Additionally I have outlined my exam and assessment/plan below:   PE:  General: Patient resting comfortably in bed, in no acute distress. HEENT: NCAT, MMM Cardiovascular: RRR. 3/6 holosystolic murmur. Radial pulses 2+ bilaterally. Respiratory: Normal work of breathing at rest, increased work of breathing with speech and any movement. End-expiratory wheezes present diffusely. Decreased breath sounds over the mid and lower left lung. Mild crackles in left base. Abdomen: Soft, NTND. +BS. Notably, softball sized mass vs stool burden in the LLQ, extending into the pelvis. Extremities: R AKA. No edema or calf tenderness in the LLE.  A/P: Suzanne [name] is a 81 y.o. female presenting with SOB, large left-sided effusion and an abnormal soft tissue mass at the left pulmonary hilum. PMH is significant for CHF, COPD, HTN, DM, aortic aneurysm, and PAD s/p R leg AKA.  Shortness of Breath, L pleural effusion, Possible mass in L pulmonary hilum, COPD: Continues to be short of breath. - Pulmonology consult for possible bronchoscopy - Thoracocentesis today per IR - Continue home  Symbicort - Scheduled Duoneb q6h and Albuterol q2h PRN - Reduce Lasix to home 40 mg PO daily, as CHF exacerbation unlikely and patient euvolemic today - O2 prn  Chest Pain: Resolved. EKG with mild ST elevation in the anteroseptal leads with new LBBB. Troponins 0.05 persistently overnight. Likely GERD, but consider cardiac etiology with EKG changes - Repeat ECHO today - Repeat EKG pending - Risk stratification with TSH, Hemoglobin A1c, and lipid panel pending - ASA - Continue home pepcid and protonix  Constipation, possible mass in LLQ: Mass possibly stool burden vs tumor - Abdominal CT w/o contrast today - Dulcolax enema PRN  Hypokalemia: K 3.2 today. - Will replete with KDur 40 mEq today - Continue to monitor  DM: On Lantus 4U qhs at home. - Continue home Lantus - SSI  HLD: On Crestor 20 mg daily at home. - Continue home Crestor  CKD Stage III: At baseline - Avoid nephrotoxic agents - Continue to monitor  FEN/GI: NPO pending thoracocentesis PPx: Heparin per pharmacy   Virginia Crews, MD PGY-2,  [facility] 10/18/2014  9:46 AM

## 2014-10-18 NOTE — Progress Notes (Signed)
  Echocardiogram 2D Echocardiogram has been performed.  Bobbye Charleston 10/18/2014, 1:16 PM

## 2014-10-18 NOTE — H&P (Addendum)
[facility] ATTENDING ADMISSION NOTE Josephina Melcher,MD I  have seen and examined this patient, reviewed their chart. I have discussed this patient with the resident. I agree with the resident's findings, assessment and care plan.  81 Y/O F with PMX of CHF,COPD,HTN,HLD,DM2,CKD 3,PAV S/P right AKA,pulmonary nodule brought in by her daughter for 2 wks hx of SOB and burning chest pain. She currently denies chest pain. She is on 2.5 L of O2 at baseline but has been needing to increase her O2 more at home recently. She denies any fever,she coughs occasionally. Patient presented to her PCP's office today who sent her over for abnormal breath sound and questionable abnormal EKG. She also endorsed being constipated for days.  Exam: Gen: Not in distress, sitting calmly in a chair. HEENT: EOMI,PERRLA. Neuro: Awake and alert, oriented x 3. Resp: Air entry equal with mild wheezing and fine crackles at her lung base more on the left. Heart: S1 S2 normal, no murmur. Abd: Soft, NT/ND, BS+ and normal. Ext: No edema of her left LL. AKA of the right  A/:P: 81 Y/O F with 1. SOB: Multifactorial ( Pleural infection r/o malignancy,or mild CHF/COPD exacerbation)    + Pleural left effusion on chest xray with possible Pneumonic changes.     CT chest:Abnormal soft tissue at the LEFT pulmonary hilum extending into the mediastinum and LEFT lower lobe with occlusion of the LEFT lower lobe bronchus at its origin, high suspicious for tumor though portions this may represent adenopathy. Left pleural effusion still present. Mild ascending thoracic aorta dilation.     PNA unlikely, she is afebrile, WBC normal and CT chest not suggestive of PNA.     Patient will benefit from bronchoscopy. Recommending pulmonary consult for bronchoscopy and/IR for thoracentesis.     Home regimen for COPD and CHF.     Continue home O2 as, titrate up if requiring more.  2. Chest pain: Atypical in nature, currently asymptomatic.     EKG with mild ST  elevation at the anteroseptal lead. With new LBBB.     LBBB may occur in the setting of chronic HTN vs ischemic heart disease.     Repeat EKG, cycle troponin,obtain ECHO, check risk stratification labs ( Bmet, TSH, A1C,FLP).     ASA coverage but will monitor for reocurrence of her burning pain (?? GERD).     On PEPCID and protonix for GI protection.     Continue Crestor for HLD.     Pain control with tylenol as needed.  3. Constipation: Abdominal exam benign. Miralax/senna for constipation.     GI workup as outpatient if no improvement.  4. Chronic Dx: CHF,COPD, HLD, DM2,GERD: Review home regimen and continue as appropriate.  5. Abnormal UA: Patient asymptomatic: Urine culture obtained. We will followup with patient and reassess her for UTI symptoms.  NB: Thoracic dilation/aneuryms noted on CT. Patient has hx of this. Followup CT in 1 yr with her PCP.

## 2014-10-18 NOTE — Consult Note (Signed)
Name: Suzanne [name] MRN: [id number] DOB: 08-06-1921    ADMISSION DATE:  10/17/2014 CONSULTATION DATE:  8/31  REFERRING MD :  FPTS  CHIEF COMPLAINT:  Lung mass   BRIEF PATIENT DESCRIPTION: 81yo female with hx dCHF, HTN, severe COPD on home O2 (has never seen outpt pulm), DM who presented 8/30 with 2 week hx increasing SOB.  Found to have mod L pleural effusion and large left hilar pulmonary mass extending into the mediastinum and LLL with complete LLL collapse.  PCCM consulted for ?FOB.   SIGNIFICANT EVENTS    STUDIES:  2D echo 8/31>>>   HISTORY OF PRESENT ILLNESS: 81yo female with hx dCHF, HTN, severe COPD on home O2, DM who presented 8/30 with 2 week hx increasing SOB.  Found to have mod L pleural effusion and large left hilar pulmonary mass extending into the mediastinum and LLL with complete LLL collapse.  PCCM consulted for ?FOB.  Pt denies weight loss, hemoptysis, purulent sputum, fever, chest pain.  Does c/o abd tenderness and nausea.  Has 80 pack year smoking hx, quit 1998.    Has R AKA.  At baseline gets very SOB just transferring in/out of wheelchair.    PAST MEDICAL HISTORY :   has a past medical history of PVD (peripheral vascular disease); Mixed hyperlipidemia; Compression fx, thoracic spine; Ischium fracture; Memory loss, short term; Pulmonary nodule; Chronic diastolic CHF (congestive heart failure) (09-2008); Anemia; Detached retina (02-2005); Fracture (02-2007); Left ventricular dysfunction; AAA (abdominal aortic aneurysm); Critical lower limb ischemia; Chronic respiratory failure; Hypertension; Warfarin anticoagulation; COPD (chronic obstructive pulmonary disease); Type II or unspecified type diabetes mellitus without mention of complication, not stated as uncontrolled; Complication of anesthesia; Pneumonia; GERD (gastroesophageal reflux disease); Arthritis; CAD (coronary artery disease) (10/12/2008); Renal artery stenosis; On home oxygen therapy; History of right above  knee amputation; Pleural effusion (09/2014); Shortness of breath dyspnea; and UTI (urinary tract infection).  has past surgical history that includes Abdominal aortic aneurysm repair (98); Aorto-femoral Bypass Graft (Bilateral); Cholecystectomy; Cataract extraction (Right, 06-2006); Vascular surgery; Cardiac catheterization; Retinal detachment surgery (Right); Eye surgery; Patch angioplasty (Left, 04/19/2013); Femoral-popliteal Bypass Graft (Left, 04/19/2013); Fracture surgery; Above knee leg amputaton (Right, 09/27/2013); Femoral peroneal bypass graft; Aorto-femoral Bypass Graft; Patella fracture surgery (Right, ~ 1987); Total abdominal hysterectomy; Amputation (Right, 09/27/2013); and lower extremity angiogram (Left, 04/04/2013). Prior to Admission medications   Medication Sig Start Date End Date Taking? Authorizing Provider  acetaminophen (TYLENOL) 500 MG tablet Take 1,000 mg by mouth every 4 (four) hours as needed for moderate pain.    Yes Historical Provider, MD  albuterol (PROVENTIL) (2.5 MG/3ML) 0.083% nebulizer solution Take 2.5 mg by nebulization 3 (three) times daily as needed for wheezing or shortness of breath.    Yes Historical Provider, MD  aspirin EC 325 MG EC tablet Take 1 tablet (325 mg total) by mouth daily. 04/05/13  Yes Dayna N Dunn, PA-C  budesonide-formoterol (SYMBICORT) 160-4.5 MCG/ACT inhaler Inhale 2 puffs into the lungs 2 (two) times daily. 06/29/14  Yes Darlyne Russian, MD  Calcium Carbonate-Vitamin D (CALCIUM 600+D) 600-400 MG-UNIT per tablet Use bid Patient taking differently: Take 1 tablet by mouth 2 (two) times daily. Use bid 12/16/13  Yes Darlyne Russian, MD  CRESTOR 20 MG tablet TAKE 1 TABLET DAILY 10/03/14  Yes Darlyne Russian, MD  esomeprazole (NEXIUM) 40 MG capsule Take 1 capsule (40 mg total) by mouth daily before breakfast. 12/16/13  Yes Darlyne Russian, MD  ferrous sulfate 325 (65 FE) MG  tablet Take 1 tablet (325 mg total) by mouth daily with breakfast. 12/16/13  Yes Darlyne Russian, MD   furosemide (LASIX) 40 MG tablet Take 1 tablet (40 mg total) by mouth daily. 12/16/13  Yes Darlyne Russian, MD  Insulin Glargine (LANTUS SOLOSTAR) 100 UNIT/ML Solostar Pen Inject 8 Units into the skin daily. 06/29/14  Yes Darlyne Russian, MD  Ipratropium-Albuterol (COMBIVENT RESPIMAT) 20-100 MCG/ACT AERS respimat Inhale 2 puffs into the lungs every 6 (six) hours as needed for wheezing or shortness of breath. 12/16/13  Yes Darlyne Russian, MD  lidocaine (LIDODERM) 5 % Place 1 patch onto the skin daily. Remove & Discard patch within 12 hours or as directed by MD 06/29/14  Yes Darlyne Russian, MD  Omega-3 Fatty Acids (FISH OIL) 1200 MG CAPS Take 1 capsule (1,200 mg total) by mouth 2 (two) times daily. 06/29/14  Yes Darlyne Russian, MD  ranitidine (ZANTAC) 150 MG tablet Take 1 tablet (150 mg total) by mouth 2 (two) times daily. 12/16/13  Yes Darlyne Russian, MD  Alcohol Swabs (ALCOHOL PREP) 70 % PADS To use before injecting lantus 12/16/13   Darlyne Russian, MD  Insulin Pen Needle (PEN NEEDLES 3/16") 31G X 5 MM MISC To use once daily with LANTUS pens 12/16/13   Darlyne Russian, MD   Allergies  Allergen Reactions  . Codeine Other (See Comments)    REACTION: hallucinations  . Hydrochlorothiazide Other (See Comments)    REACTION: hypotension  . Pregabalin Other (See Comments)    REACTION: unknown  . Other Rash    Adhesives and bandages gives pt a rash.    FAMILY HISTORY:  family history includes Cancer in her father; Tuberculosis in her mother. SOCIAL HISTORY:  reports that she quit smoking about 17 years ago. Her smoking use included Cigarettes. She has a 80 pack-year smoking history. She has never used smokeless tobacco. She reports that she does not drink alcohol or use illicit drugs.  REVIEW OF SYSTEMS:   As per HPI - All other systems reviewed and were neg.    SUBJECTIVE:   VITAL SIGNS: Temp:  [97.5 F (36.4 C)-98.8 F (37.1 C)] 98 F (36.7 C) (08/31 1414) Pulse Rate:  [79-93] 79 (08/31 1414) Resp:   [18-24] 18 (08/31 1414) BP: (103-130)/(54-99) 119/62 mmHg (08/31 1502) SpO2:  [94 %-100 %] 96 % (08/31 1414) Weight:  [119 lb 11.2 oz (54.296 kg)] 119 lb 11.2 oz (54.296 kg) (08/30 1724)  PHYSICAL EXAMINATION: General:  Very pleasant, elderly female, NAD, appears much younger than stated age Neuro:  Awake, alert, appropriate, MAE  HEENT:  Mm moist, no JVD  Cardiovascular:  s1s2 irreg Lungs:  resps even non labored on [place], diminished L>R, faint exp wheeze  Abdomen:  Round, soft, mildly tender diffusely  Musculoskeletal:  Warm and dry, no edema, R AKA   Recent Labs Lab 10/17/14 1402 10/18/14 0606  NA 136 138  K 3.4* 3.2*  CL 98* 97*  CO2 29 29  BUN 29* 27*  CREATININE 1.59* 1.65*  GLUCOSE 122* 142*    Recent Labs Lab 10/17/14 1402 10/18/14 0606  HGB 9.9* 9.5*  HCT 30.5* 30.7*  WBC 9.7 7.8  PLT 308 312   Dg Chest 2 View  10/17/2014   CLINICAL DATA:  Chest pain with cough  EXAM: CHEST  2 VIEW  COMPARISON:  09/08/2013  FINDINGS: Cardiac enlargement. Moderately large left pleural effusion with left lower lobe collapse. Right lung is clear. No right  pleural effusion. No definite edema. Vascularity appears normal.  Chronic compression fracture approximately L1 unchanged.  IMPRESSION: Moderately large left effusion with left lower lobe collapse. This could be due to underlying tumor or infection. Consider chest CT for further evaluation. Thoracentesis may also be helpful.   Electronically Signed   By: Franchot Gallo M.D.   On: 10/17/2014 14:07   Ct Chest Wo Contrast  10/17/2014   CLINICAL DATA:  LEFT pleural effusion  EXAM: CT CHEST WITHOUT CONTRAST  TECHNIQUE: Multidetector CT imaging of the chest was performed following the standard protocol without IV contrast. Sagittal and coronal MPR images reconstructed from axial data set.  COMPARISON:  03/15/2009; correlation chest radiograph 10/17/2014  FINDINGS: Scattered atherosclerotic calcifications aorta, proximal great vessels, and  coronary arteries.  Aneurysmal dilatation ascending thoracic aorta 4.1 x 4.1 cm image 26.  Small pericardial effusion.  Upper normal caliber main pulmonary artery.  Moderate LEFT pleural effusion.  Hila poorly assessed due to lack of IV contrast but abnormal soft tissue is seen at the LEFT hilum and AP window extending into the subcarinal region either representing tumor adenopathy.  This appearance represents an interval change since the prior CT.  Overall of the area of abnormal soft tissue measures up to 6.8 x 5.0 x 6.5 cm.  No definite pleural nodularity identified.  Visualized upper abdomen unremarkable.  Emphysematous changes greatest at apices.  Complete atelectasis of LEFT lower lobe with occlusion of LEFT lower lobe bronchus at its origin.  Scattered tracheobronchial cartilaginous calcification.  6 mm RIGHT lung base nodule image 35 stable since 2011.  No infiltrate or pneumothorax.  Mild superior endplate compression fracture of a lower thoracic vertebra unchanged since 2011.  Degenerative disc disease changes lower thoracic spine.  IMPRESSION: Abnormal soft tissue at the LEFT pulmonary hilum extending into the mediastinum and LEFT lower lobe with occlusion of the LEFT lower lobe bronchus at its origin, high suspicious for tumor though portions this may represent adenopathy.  Consider bronchoscopy.  Associated moderate LEFT pleural effusion with atelectasis of LEFT lower lobe.  Small pericardial effusion with extensive atherosclerotic disease changes and mild aneurysmal dilatation of the ascending thoracic aorta, recommendation below.  Recommend annual imaging followup by CTA or MRA. This recommendation follows 2010 ACCF/AHA/AATS/ACR/ASA/SCA/SCAI/SIR/STS/SVM Guidelines for the Diagnosis and Management of Patients with Thoracic Aortic Disease. Circulation. 2010; 121: W098-J191   Electronically Signed   By: Lavonia Dana M.D.   On: 10/17/2014 16:35    ASSESSMENT / PLAN:  L pleural effusion - malignant v  CHF.  Lung mass with complete LLL post obstructive collapse  COPD  AFib RVR   Very pleasant elderly female with fairly poor functional status at baseline r/t R AKA and severe COPD.  Discussed lung mass and pleural effusion at length with she and her daughter.  She feels that even if this were cancer, she would not want to pursue aggressive treatment and therefor does not want to undergo invasive bx.  Think it is reasonable to offer thoracentesis for therapeutic and diagnostic purposes IF there is a significant amount of fluid and with the understanding that the diagnostic yield is much lower than tissue bx.  They would want thoracentesis for symptom management only. Suspect malignant effusion but could also be r/t CHF in setting new onset AFib with RVR.  Would need to control HR prior to thoracentesis.    PLAN -  Supplemental O2 as needed  eval chest with u/s  Consider thoracentesis if significant amount of fluid -  diagnostic and therapeutic  DNR noted  Continue BD's  Pulmonary hygiene  HR control per primary and cards to see  Consider gentle diuresis once AFib rate controled    Nickolas Madrid, NP 10/18/2014  4:21 PM Pager: [phone] or [phone]  Attending Note:  81 year old female with PMH above presenting with SOB.  CXR followed a CT that I reviewed myself show an obstructive mass on the left with pleural effusion.  Discussed with FP resident and PCCM-NP.  Lung mass: likely cancer  - No bronch.  - Patient refusing chemo/radiation so no need for bronch.  Post obstructive PNA.  - Agree with rocephine.  - F/U on culture.  Hypoxemia: due to post obstruction.  - Supplemental O2.  - Will need an ambulatory desaturation study prior to discharge to get home O2.  A-fib:  - Rate control.  - Consider cardiology consult.  Pulmonary edema:  - Diureses as ordered.  PCCM will sign off, please call back if needed.  Rush Farmer, M.D. [facility] Pulmonary/Critical Care  Medicine. Pager: [phone]. After hours pager: [phone].

## 2014-10-19 ENCOUNTER — Inpatient Hospital Stay (HOSPITAL_COMMUNITY): Payer: Medicare Other

## 2014-10-19 ENCOUNTER — Encounter (HOSPITAL_COMMUNITY): Payer: Self-pay | Admitting: Cardiology

## 2014-10-19 DIAGNOSIS — R918 Other nonspecific abnormal finding of lung field: Secondary | ICD-10-CM | POA: Insufficient documentation

## 2014-10-19 DIAGNOSIS — N184 Chronic kidney disease, stage 4 (severe): Secondary | ICD-10-CM

## 2014-10-19 DIAGNOSIS — I3139 Other pericardial effusion (noninflammatory): Secondary | ICD-10-CM | POA: Insufficient documentation

## 2014-10-19 DIAGNOSIS — I319 Disease of pericardium, unspecified: Secondary | ICD-10-CM

## 2014-10-19 DIAGNOSIS — I480 Paroxysmal atrial fibrillation: Secondary | ICD-10-CM | POA: Diagnosis not present

## 2014-10-19 DIAGNOSIS — I313 Pericardial effusion (noninflammatory): Secondary | ICD-10-CM | POA: Insufficient documentation

## 2014-10-19 DIAGNOSIS — I2510 Atherosclerotic heart disease of native coronary artery without angina pectoris: Secondary | ICD-10-CM

## 2014-10-19 DIAGNOSIS — I5022 Chronic systolic (congestive) heart failure: Secondary | ICD-10-CM

## 2014-10-19 HISTORY — DX: Paroxysmal atrial fibrillation: I48.0

## 2014-10-19 LAB — GLUCOSE, CAPILLARY
Glucose-Capillary: 138 mg/dL — ABNORMAL HIGH (ref 65–99)
Glucose-Capillary: 112 mg/dL — ABNORMAL HIGH (ref 65–99)
Glucose-Capillary: 135 mg/dL — ABNORMAL HIGH (ref 65–99)
Glucose-Capillary: 170 mg/dL — ABNORMAL HIGH (ref 65–99)

## 2014-10-19 LAB — HEMOGLOBIN A1C
HEMOGLOBIN A1C: 7.3 % — AB (ref 4.8–5.6)
MEAN PLASMA GLUCOSE: 163 mg/dL

## 2014-10-19 LAB — URINE CULTURE: Culture: >=100000

## 2014-10-19 LAB — TROPONIN I: TROPONIN I: 0.06 ng/mL — AB (ref <0.031)

## 2014-10-19 MED ORDER — IOHEXOL 300 MG/ML  SOLN
25.0000 mL | INTRAMUSCULAR | Status: AC
  Administered 2014-10-19 (×2): 25 mL via ORAL

## 2014-10-19 MED ORDER — CEPHALEXIN 250 MG PO CAPS
250.0000 mg | ORAL_CAPSULE | Freq: Two times a day (BID) | ORAL | Status: AC
  Administered 2014-10-19 – 2014-10-21 (×6): 250 mg via ORAL
  Filled 2014-10-19 (×7): qty 1

## 2014-10-19 MED ORDER — BISACODYL 10 MG RE SUPP
10.0000 mg | Freq: Once | RECTAL | Status: AC
  Administered 2014-10-19: 10 mg via RECTAL
  Filled 2014-10-19: qty 1

## 2014-10-19 NOTE — Discharge Summary (Signed)
[facility] Discharge Summary  Patient name: Suzanne [name] Medical record number: [id number] Date of birth: 04-07-1921 Age: 81 y.o. Gender: female Date of Admission: 10/17/2014  Date of Discharge: 10/24/14 Admitting Physician: Kinnie Feil, MD  Primary Care Provider: Jenny Reichmann, MD Consultants: Pulmonology, Cardiology, Palliative Care  Indication for Hospitalization: Shortness of Breath  Discharge Diagnoses/Problem List:   1. Shortness of Breath, Lung Mass 2. New-Onset Paroxysmal Atrial Fibrillation with RVR 3. Constipation 4. EKG Abnormality 5. Acute Systolic CHF 6. UTI 7. Hypokalemia 8. Diabetes Mellitus 9. Hyperlipidemia 10. CKD Stage III  Disposition: [facility]  Discharge Condition: Stable  Discharge Exam:  General: Elderly woman lying in bed, pleasant and conversational, in NAD HEENT: [place]/AT, EOMI, MMM Cardiovascular: RRR. 2/6 systolic murmur loudest in upper L sternal border Respiratory: Normal WOB. No crackles or wheezes auscultated today. Decreased breath sounds on the L compared to the R. Abdomen: +BS, soft, but mildly distended, TTP in RLQ and LLQ. MSK: R AKA site well healed.   Brief Hospital Course:  Suzanne [name] is a 81 y.o. woman who presented with two weeks of progressive shortness of breath and was found to have a left-sided pleural effusion and an abnormal soft tissue mass at the left pulmonary hilum. Please see hospital course by problem list below.  Lung Mass, Shortness of Breath: On admission, chest CT was notable for an abnormal soft tissue mass at the left pulmonary hilum extending into the mediastinum; this was felt to be highly suspicious for tumor. There was also a moderate left pleural effusion with atelectasis of the left lower lobe. She was seen by Pulmonology, who performed a bedside ultrasound and felt that thoracocentesis would not be palliative for her. Also discussed bronchoscopy with biopsy with  the patient and her daughter, who declined further invasive work-up and wished to pursue solely palliative care. She was maintained on 2L O2 by [place] continuously throughout admission and saturated well, though with persistent shortness of breath on exertion. Continued on home Symbicort and PRN albuterol. Also started on q6h Duonebs for increased wheezing during admission. Palliative Care was consulted and recommended inpatient hospice. They felt that she should expect to live another 2-4 weeks. They also recommended Ativan and Oxycodone PRN for dyspnea. She was discharged to [facility] on 9/6.   New-Onset Paroxysmal Atrial Fibrillation with RVR: Patient with new-onset atrial fibrillation during admission, started briefly on diltiazem drip with conversion to normal sinus rhythm; however, later converted back to atrial fibrillation. Discontinued diltiazem drip and started on Coreg 6.5 BID, increased to 9.375 mg BID with successful rate control. Discharged on Coreg 9.375; not started on anticoagulation due to significant bleeding risk.  Constipation: At admission, patient with report of two months of new-onset constipation. Also reported that she was passing only small, pencil-thin stools that were dark brown to black in color. Abdominal CT was obtained, which was notable only for significant stool burden and spasm of the proximal ascending colon. Given suppositories with resolution of constipation and discharged on a bowel regimen of Miralax qd and Dulcolax suppository qd PRN.  EKG Abnormality, Acute Systolic CHF: EKG initially with new LBBB and mild ST elevation in the anterior leads, ST changes subsequently resolved. Troponins persistently 0.05-0.07 during admission. ECHO notable for HFrEF (EF 35-40%). Continued home Lasix and ASA during admission. After Pt was transitioned to comfort care, we continued to Lasix for comfort measures, but d/c'ed the ASA.  UTI: Urine culture with >100,000 CFU  e. Coli,  pan sensitive. Received 2 days IV ceftriaxone and 3 days PO Keflex (8/30-9/3).   Hypokalemia: K 3.4 on admission, supplemented. Resolved at discharge and will not continue as this is not necessary for comfort.  DM, HLD, CKD Stage III: Continued on home Lantus and Crestor on admission. Creatinine 1.59 on admission, at her baseline. After she was transitioned to comfort care, we stopped her insulin and Crestor.  Issues for Follow Up:  1. None  Significant Procedures: None  Significant Labs and Imaging:   Recent Labs Lab 10/17/14 1402 10/18/14 0606 10/21/14 1502  WBC 9.7 7.8 8.6  HGB 9.9* 9.5* 9.5*  HCT 30.5* 30.7* 30.6*  PLT 308 312 314    Recent Labs Lab 10/17/14 1402 10/18/14 0606 10/18/14 1610 10/21/14 1502  NA 136 138 141 135  K 3.4* 3.2* 3.6 3.9  CL 98* 97* 100* 96*  CO2 '29 29 29 27  '$ GLUCOSE 122* 142* 98 168*  BUN 29* 27* 27* 40*  CREATININE 1.59* 1.65* 1.75* 1.61*  CALCIUM 8.8* 8.6* 9.0 8.7*  MG  --   --  1.8  --    Urine Culture: >100,000 CFU escherichia coli, pan sensitive BNP: 298.3 Troponins: 0.05-0.07 persistently TSH: 2.720 Lipid Panel: Cholesterol 104, Triglycerides 102, HDL 49, LDL 35 (WNL) Hgb A1c: 7.3 Magnesium: 1.8  Transthoracic Echocardiogram (10/18/14): - Left ventricle: The cavity size was normal. Wall thickness wasincreased in a pattern of mild LVH. Systolic function wasmoderately reduced. The estimated ejection fraction was in therange of 35% to 40%. Doppler parameters are consistent withabnormal left ventricular relaxation (grade 1 diastolicdysfunction). - Aortic valve: Mildly calcified annulus. Valve area (VTI): 1.58cm^2. Valve area (Vmax): 1.52 cm^2. Valve area (Vmean): 1.73cm^2. - Mitral valve: Moderately calcified annulus. Mildly thickened,mildly calcified leaflets . Valve area by continuity equation(using LVOT flow): 1.14 cm^2. - Pericardium, extracardiac: A moderate pericardial effusion wasidentified.   No results  found.  Results/Tests Pending at Time of Discharge: None  Discharge Medications:    Medication List    STOP taking these medications        Alcohol Prep 70 % Pads     aspirin 325 MG EC tablet     Calcium Carbonate-Vitamin D 600-400 MG-UNIT per tablet  Commonly known as:  CALCIUM 600+D     CRESTOR 20 MG tablet  Generic drug:  rosuvastatin     ferrous sulfate 325 (65 FE) MG tablet     Fish Oil 1200 MG Caps     Insulin Glargine 100 UNIT/ML Solostar Pen  Commonly known as:  LANTUS SOLOSTAR     Pen Needles 3/16" 31G X 5 MM Misc      TAKE these medications        acetaminophen 500 MG tablet  Commonly known as:  TYLENOL  Take 1,000 mg by mouth every 4 (four) hours as needed for moderate pain.     albuterol (2.5 MG/3ML) 0.083% nebulizer solution  Commonly known as:  PROVENTIL  Take 2.5 mg by nebulization 3 (three) times daily as needed for wheezing or shortness of breath.     bisacodyl 10 MG suppository  Commonly known as:  DULCOLAX  Place 1 suppository (10 mg total) rectally daily as needed for moderate constipation.     budesonide-formoterol 160-4.5 MCG/ACT inhaler  Commonly known as:  SYMBICORT  Inhale 2 puffs into the lungs 2 (two) times daily.     carvedilol 3.125 MG tablet  Commonly known as:  COREG  Take 3 tablets (9.375 mg total) by  mouth 2 (two) times daily with a meal.     esomeprazole 40 MG capsule  Commonly known as:  NEXIUM  Take 1 capsule (40 mg total) by mouth daily before breakfast.     furosemide 40 MG tablet  Commonly known as:  LASIX  Take 1 tablet (40 mg total) by mouth daily.     Ipratropium-Albuterol 20-100 MCG/ACT Aers respimat  Commonly known as:  COMBIVENT RESPIMAT  Inhale 2 puffs into the lungs every 6 (six) hours as needed for wheezing or shortness of breath.     lidocaine 5 %  Commonly known as:  LIDODERM  Place 1 patch onto the skin daily. Remove & Discard patch within 12 hours or as directed by MD     LORazepam 0.5 MG tablet   Commonly known as:  ATIVAN  Take 1 tablet (0.5 mg total) by mouth every 4 (four) hours as needed for anxiety.     oxyCODONE 5 MG immediate release tablet  Commonly known as:  Oxy IR/ROXICODONE  Take 1 tablet (5 mg total) by mouth every 4 (four) hours as needed for moderate pain (dyspnea).     polyethylene glycol packet  Commonly known as:  MIRALAX / GLYCOLAX  Take 17 g by mouth daily.     ranitidine 150 MG tablet  Commonly known as:  ZANTAC  Take 1 tablet (150 mg total) by mouth 2 (two) times daily.        Discharge Instructions: Please refer to Patient Instructions section of EMR for full details.  Patient was counseled important signs and symptoms that should prompt return to medical care, changes in medications, dietary instructions, activity restrictions, and follow up appointments.   Follow-Up Appointments:  None  Sela Hua, MD 10/24/2014, 9:53 AM PGY-1, Family Medicine Service

## 2014-10-19 NOTE — Progress Notes (Addendum)
Initial Nutrition Assessment  DOCUMENTATION CODES:   Not applicable  INTERVENTION:   Advance diet as medically appropriate    No further nutrition intervention -- pt's daughter declined  NUTRITION DIAGNOSIS:   Increased nutrient needs related to chronic illness as evidenced by estimated needs  GOAL:   Patient will meet greater than or equal to 90% of their needs   MONITOR:   Diet advancement, PO intake, Labs, Weight trends, I & O's  REASON FOR ASSESSMENT:   Malnutrition Screening Tool  ASSESSMENT:   81 y.o. Female with PMH of CHF, CAD, COPD, PVD s/p R AKA; presented with two weeks of progressive SOB and imaging concerning for left-sided effusion and an abnormal soft tissue mass at the left pulmonary hilum, also with LLQ mass noted.  RD spoke with pt's daughter at bedside.  Pt lives alone, however, daughter helps with meal preparation.  Daughter reports pt typically eats well.  No recent weight loss.  Pt does not drink any type of oral nutrition supplement at home -- daughter declined addition of during hospitalization.  Nutrition focused physical exam completed.  No muscle or subcutaneous fat depletion noticed.  Diet Order:  Diet regular Room service appropriate?: Yes; Fluid consistency:: Thin  Skin:  Reviewed, no issues  Last BM:  N/A  Height:   Ht Readings from Last 1 Encounters:  10/17/14 '5\' 2"'$  (1.575 m)    Weight:   Wt Readings from Last 1 Encounters:  10/17/14 119 lb 11.2 oz (54.296 kg)    Ideal Body Weight:  50 kg  BMI:  Body mass index is 21.89 kg/(m^2).  Estimated Nutritional Needs:   Kcal:  1200-1400  Protein:  55-65 gm  Fluid:  >/= 1.5 L  EDUCATION NEEDS:   No education needs identified at this time  Arthur Holms, RD, LDN Pager #: [phone] After-Hours Pager #: [phone]

## 2014-10-19 NOTE — Progress Notes (Signed)
Patient: Suzanne [name] / Admit Date: 10/17/2014 / Date of Encounter: 10/19/2014, 9:18 AM   Subjective: Feeling better this AM. No CP, SOB, palpitations. C/o dry mouth - has to be NPO for abdominal CT today.   Objective: Telemetry: maintaining NSR, rare PVC Physical Exam: Blood pressure 108/84, pulse 88, temperature 97.6 F (36.4 C), temperature source Oral, resp. rate 19, height '5\' 2"'$  (1.575 m), weight 119 lb 11.2 oz (54.296 kg), SpO2 98 %. General: Well developed frail appearing elderly WF in no acute distress. Head: Normocephalic, atraumatic, sclera non-icteric, no xanthomas, nares are without discharge. Neck: JVP not elevated. Lungs: Diffusely diminished, with absent BS left base, occasional faint rhonchi. No wheezing or rales. Breathing is unlabored. Heart: RRR S1 S2 without murmurs, rubs, or gallops.  Abdomen: Soft, non-tender, non-distended with normoactive bowel sounds. No rebound/guarding. Extremities: No clubbing or cyanosis. No edema LLE. S/p R AKA. Warm. Neuro: Alert and oriented X 3. Moves all extremities spontaneously. Somewhat hard of hearing. Psych:  Responds to questions appropriately with a normal affect.  No intake or output data in the 24 hours ending 10/19/14 0918  Inpatient Medications:  . aspirin EC  325 mg Oral Daily  . budesonide-formoterol  2 puff Inhalation BID  . carvedilol  6.25 mg Oral BID WC  . cefTRIAXone (ROCEPHIN)  IV  1 g Intravenous Q24H  . famotidine  10 mg Oral Daily  . ferrous sulfate  325 mg Oral Q breakfast  . furosemide  40 mg Oral Daily  . heparin subcutaneous  5,000 Units Subcutaneous 3 times per day  . insulin aspart  0-9 Units Subcutaneous TID WC  . insulin glargine  4 Units Subcutaneous QHS  . ipratropium-albuterol  3 mL Nebulization Q6H  . pantoprazole  40 mg Oral Daily  . rosuvastatin  20 mg Oral Daily   Infusions:    Labs:  Recent Labs  10/18/14 0606 10/18/14 1610  NA 138 141  K 3.2* 3.6  CL 97* 100*  CO2 29 29    GLUCOSE 142* 98  BUN 27* 27*  CREATININE 1.65* 1.75*  CALCIUM 8.6* 9.0  MG  --  1.8   No results for input(s): AST, ALT, ALKPHOS, BILITOT, PROT, ALBUMIN in the last 72 hours.  Recent Labs  10/17/14 1402 10/18/14 0606  WBC 9.7 7.8  HGB 9.9* 9.5*  HCT 30.5* 30.7*  MCV 93.8 93.9  PLT 308 312    Recent Labs  10/18/14 0606 10/18/14 1610 10/18/14 2058 10/19/14 0400  TROPONINI 0.05* 0.05* 0.06* 0.06*   Invalid input(s): POCBNP  Recent Labs  10/18/14 0859  HGBA1C 7.3*     Radiology/Studies:  Dg Chest 2 View  10/17/2014   CLINICAL DATA:  Chest pain with cough  EXAM: CHEST  2 VIEW  COMPARISON:  09/08/2013  FINDINGS: Cardiac enlargement. Moderately large left pleural effusion with left lower lobe collapse. Right lung is clear. No right pleural effusion. No definite edema. Vascularity appears normal.  Chronic compression fracture approximately L1 unchanged.  IMPRESSION: Moderately large left effusion with left lower lobe collapse. This could be due to underlying tumor or infection. Consider chest CT for further evaluation. Thoracentesis may also be helpful.   Electronically Signed   By: Franchot Gallo M.D.   On: 10/17/2014 14:07   Ct Chest Wo Contrast  10/17/2014   CLINICAL DATA:  LEFT pleural effusion  EXAM: CT CHEST WITHOUT CONTRAST  TECHNIQUE: Multidetector CT imaging of the chest was performed following the standard protocol without IV contrast. Sagittal  and coronal MPR images reconstructed from axial data set.  COMPARISON:  03/15/2009; correlation chest radiograph 10/17/2014  FINDINGS: Scattered atherosclerotic calcifications aorta, proximal great vessels, and coronary arteries.  Aneurysmal dilatation ascending thoracic aorta 4.1 x 4.1 cm image 26.  Small pericardial effusion.  Upper normal caliber main pulmonary artery.  Moderate LEFT pleural effusion.  Hila poorly assessed due to lack of IV contrast but abnormal soft tissue is seen at the LEFT hilum and AP window extending into  the subcarinal region either representing tumor adenopathy.  This appearance represents an interval change since the prior CT.  Overall of the area of abnormal soft tissue measures up to 6.8 x 5.0 x 6.5 cm.  No definite pleural nodularity identified.  Visualized upper abdomen unremarkable.  Emphysematous changes greatest at apices.  Complete atelectasis of LEFT lower lobe with occlusion of LEFT lower lobe bronchus at its origin.  Scattered tracheobronchial cartilaginous calcification.  6 mm RIGHT lung base nodule image 35 stable since 2011.  No infiltrate or pneumothorax.  Mild superior endplate compression fracture of a lower thoracic vertebra unchanged since 2011.  Degenerative disc disease changes lower thoracic spine.  IMPRESSION: Abnormal soft tissue at the LEFT pulmonary hilum extending into the mediastinum and LEFT lower lobe with occlusion of the LEFT lower lobe bronchus at its origin, high suspicious for tumor though portions this may represent adenopathy.  Consider bronchoscopy.  Associated moderate LEFT pleural effusion with atelectasis of LEFT lower lobe.  Small pericardial effusion with extensive atherosclerotic disease changes and mild aneurysmal dilatation of the ascending thoracic aorta, recommendation below.  Recommend annual imaging followup by CTA or MRA. This recommendation follows 2010 ACCF/AHA/AATS/ACR/ASA/SCA/SCAI/SIR/STS/SVM Guidelines for the Diagnosis and Management of Patients with Thoracic Aortic Disease. Circulation. 2010; 121: P950-D326   Electronically Signed   By: Lavonia Dana M.D.   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 She was found to have large  pleural effusion and large left hilar mass extending into the mediastinum and LLL with complete LLL collapse, which pulm feels is likely cancer. Also noted to be in AF RVR around 1pm on 10/18/14. Note previously on Coumadin for PAD but has not taken any meds in years besides daily ASA '325mg'$  daily.  1. New onset atrial fibrillation with RVR -> spont converted to NSR - at increased risk of cardioembolic events but with advanced age, frailty, decreased mobility, transferring in and out of wheelchair at home, and new lung mass concerning for malignancy, would be very hesitant to start on anticoagulation besides her '325mg'$  ASA she had been taking PTA - continue rate control strategy - maintaining NSR on current regimen (using BB rather than diltiazem due to ?EF) - if rhythm becomes an issue can use amiodarone for palliative basis  2. Abnormal troponin in setting of mild CAD in 2010 and above comorbidities - one episode of atypical chest pain last week that was relieved with ginger ale and belching; no recurrence of pain since - troponin trend relatively flat, likely in setting of AF RVR and CKD - In light of new lung mass that is probably CA and patients request for no futher w/u or treatment of mass  and advanced age and frail state, would not proceed with further cardiac w/u of CP  3. New LV dysfunction by echocardiogram - 2D Echo 10/18/14: mild LVH, EF 35-40%, grade 1 DD, mildly calcified AV annulus, mod calcified MV annulus, mod pericardial effusion - continue new Coreg and home Lasix; not on ACEI/ARB right now due to CKD and little room in BP to advance HR controlling medicines if needed  4. Large left pulmonary hilum mass on CT, also with large left pleural effusion  - pulm signed off as patient does not want any chemo/radiation for her lung mass, therefore no role for diagnostic bx - daughter reports thoracentesis was cancelled - further per IM - for CT abd/pelvis given consiptation and dark  pencil-thin stool - I see palliative consult has been placed which I believe is very appropriate  5. Moderate pericardial effusion by echo 10/18/14 - no evidence of tamponade - in setting of hilar mass, malignant effusion is of concern. Patient does not wish anything further be done about lung mass so will not proceed with diagnostic pericardiocentesis since she does not want treatment for probable lung CA at this time.  Signed, Melina Copa PA-C Pager: [phone]    Patient seen and examined. Agree with assessment and plan. Maintaining sinus rhythm. Feels that she is breathing better. No rub, 1/6 sem, diffusely decreased BS.  Pt does not wish to pursue further RX for lung mass highly suspicious for malignancy.   Troy Sine, MD, [facility] 10/19/2014 3:58 PM

## 2014-10-19 NOTE — Progress Notes (Signed)
Family Medicine Teaching Service Daily Progress Note Intern Pager: [phone]  Patient name: Suzanne [name] Medical record number: [id number] Date of birth: 11-Mar-1921 Age: 81 y.o. Gender: female  Primary Care Provider: Jenny Reichmann, MD Consultants: Pulmonology, Cardiology, Palliative Care Code Status: DNR  Pt Overview and Major Events to Date:  8/30: Admitted with SOB. CXR with left effusion with left lower lobe collapse; CT notable for abnormal soft tissue at the left pulmonary hilum extending into the mediastinum. 8/31: Mass noted in LLQ. Continued constipation. New-onset paroxysmal afib with RVR, put on diltiazem drip with successful conversion to sinus rhythm.  Assessment and Plan:  Suzanne [name] is a 80 y.o. female presenting with two weeks of progressive shortness of breath and imaging concerning for left-sided effusion and an abnormal soft tissue mass at the left pulmonary hilum, also with LLQ mass noted on exam. PMH is significant for CHF, CAD, COPD, HTN, DM, aortic aneurysm, and peripheral vascular disease s/p R leg AKA.  Shortness of Breath, Suspicious Lung Mass with Effusion: Patient seen by Pulmonology overnight, and following that visit patient and family have decided not to pursue biopsy of her suspicious lung mass. Pulmonology also performed bedside ultrasound and felt that thoracocentesis would not be palliative for her, as there is only a small amount of fluid that appears to have been present for an extended period. Patient and family would like to pursue palliative care at this time.  - Appreciate Pulmonology recommendations - Palliative Care consult - Continue home Symbicort - Continue Duoneb q6h - Albuterol q2h PRN - Lasix 40 mg PO daily - Daily CBC, CMP - On 2L O2 at present and sating well, home O2 2.5L. Will titrate up as necessary.  New-Onset Paroxysmal Atrial Fibrillation with RVR: Yesterday, patient with first episode of atrial fibrillation with RVR, rates into  the 130s-140s. Started briefly on a diltiazem drip with successful conversion to sinus rhythm. Started on Coreg with sufficient rate control overnight. - Appreciate Cardiology recommendations - Continue Coreg 6.25 mg BID, will titrate as appropriate - Agree with Cardiology recommendation that risk outweighs benefit in regard to starting anticoagulation in this patient. - Continue home ASA 325 mg  EKG Abnormality, Acute Systolic CHF: Troponins stable overnight. Patient and family wish to defer workup of new LBBB and ECHO notable for HFrEF (EF 35-40%), and rather will be pursuing palliative care. TSH and lipid panel for risk stratification WNL. A1c only mildly elevated. - Appreciate cardiology recommendations - d/c troponins - Coreg as above - Will consider hydralazine or nitrate as BP allows.  Constipation: Patient with new-onset constipation and tenesmus. Mass appreciated on exam; patient without BM since admission, passing some gas. Patient and family aware, desire imaging to assess abdominal mass; however, do not wish to pursue further diagnostic measures. - Abdominal CT w/o contrast today - Dulcolax enema scheduled daily and PRN - Continue home pepcid and protonix  UTI: Urine culture with >100,000 CFU e. Coli, pan sensitive. She has received 2 days of IV Ceftriaxone. - Transition to PO Keflex to complete 5 days of antibiotics (8/30-9/3)  Hypokalemia: Resolved.  DM: On Lantus 4U qhs at home. A1c 7.3 this admission. - Continue home Lantus - SSI  HLD: On Crestor 20 mg daily at home. - Continue home Crestor  CKD Stage III: History of 80% renal artery stenosis found at time of remote angiography (02/02/1998) that has been followed by Dr. Gwenlyn Found and has been stable on repeat renal Doppler studies. Creatinine on admission 1.59, which  appears to be near her baseline.  FEN/GI: NPO until s/p thoracocentesis PPx: Heparin per pharmacy  Disposition: Continue inpatient admission pending  improvement in clinical status.  Subjective: Overnight, patient without improvement in shortness of breath. Reports continued chest pressure over the lower sternum. Denies palpitations. Also endorses continued constipation and pain in her lower abdomen. Passed some gas yesterday and overnight, but none yet this morning. Denies nausea and vomiting.  Objective: Temp:  [97.6 F (36.4 C)-98.2 F (36.8 C)] 97.6 F (36.4 C) (09/01 0651) Pulse Rate:  [79-98] 88 (09/01 0651) Resp:  [18-20] 19 (09/01 0651) BP: (103-124)/(58-84) 108/84 mmHg (09/01 0651) SpO2:  [96 %-98 %] 96 % (09/01 0651)  Physical Exam: General: Patient resting comfortably in bed, in no acute distrses. Cardiovascular: Regular rate and rhythm. 2/6 holosystolic murmur appreciated. LLE DP pulse 2+, radial pulses 2+ bilaterally. Respiratory: Normal work of breathing at rest on 2L [place]. Patient without wheezes this morning. Decreased breath sounds over the left lower and mid lung. No crackles appreciated. Abdomen: Soft and non-distended. Tender to palpation in the RLQ and LLQ. +BS. 5-6 cm elongated mass still appreciated in the LLQ, extending into the pelvis. Extremities: R AKA. No edema or calf tenderness in the LLE. Tenderness over the left shin which she reports is chronic 2/2 skin changes.  Laboratory:  Recent Labs Lab 10/17/14 1402 10/18/14 0606  WBC 9.7 7.8  HGB 9.9* 9.5*  HCT 30.5* 30.7*  PLT 308 312    Recent Labs Lab 10/17/14 1402 10/18/14 0606 10/18/14 1610  NA 136 138 141  K 3.4* 3.2* 3.6  CL 98* 97* 100*  CO2 '29 29 29  '$ BUN 29* 27* 27*  CREATININE 1.59* 1.65* 1.75*  CALCIUM 8.8* 8.6* 9.0  GLUCOSE 122* 142* 98    Troponins: 0.05, 0.06, 0.06 Magnesium: 1.8 Hemoglobin A1c: 7.3 Lipid Panel: Cholesterol 104, Triglycerides 102, HDL 49, LDL 35 TSH: 2.720 (WNL)  Imaging:  CARDIAC ECHO (8/31): Study Conclusions: - Left ventricle: The cavity size was normal. Wall thickness wasincreased in a pattern of  mild LVH. Systolic function was moderately reduced. The estimated ejection fraction was in therange of 35% to 40%. Doppler parameters are consistent withabnormal left ventricular relaxation (grade 1 diastolicdysfunction). - Aortic valve: Mildly calcified annulus. Valve area (VTI): 1.58cm^2. Valve area (Vmax): 1.52 cm^2. Valve area (Vmean): 1.73cm^2. - Mitral valve: Moderately calcified annulus. Mildly thickened,mildly calcified leaflets. Valve area by continuity equation(using LVOT flow): 1.14 cm^2. - Pericardium, extracardiac: A moderate pericardial effusion wasidentified.  Orion Crook, Med Student 10/19/2014, 7:27 AM MS4, [place] Intern pager: [phone], text pages welcome  RESIDENT ADDENDUM  I have separately seen and examined the patient. I have discussed the findings and exam with the medical student and agree with the above note, which I have edited appropriately. I helped develop the management plan that is described in the student's note, and I agree with the content.  Additionally I have outlined my exam and assessment/plan below:   Doing well. Mild pain in the region of the esophagus. No more heart palpitations.   PE:  Blood pressure 108/84, pulse 88, temperature 97.6 F (36.4 C), temperature source Oral, resp. rate 19, height '5\' 2"'$  (1.575 m), weight 119 lb 11.2 oz (54.296 kg), SpO2 98 %. General: Lying in bed in NAD. Non-toxic Eyes: Conjunctivae non-injected.  Neck: Supple, no LAD Cardiovascular: RRR. 3/6 systolic murmur. 1+ DP pulse on the left. Respiratory: No increased WOB at rest. End expiratory wheezing throughout. Decreased breath sounds  on the L compared to the R. Crackles in the left in the base up to mid lung.  Abdomen: Hypoactive bowel sounds. Some hard distension noted in the LLQ.  MSK: R AKA site well healed. Violaceous changes to the L LE.    A/P:  Suzanne [name] is a 81 y.o. female presenting with SOB, large left-sided effusion and an  abnormal soft tissue mass at the left pulmonary hilum. PMH is significant for CHF, COPD, HTN, DM, aortic aneurysm, and PAD s/p R leg AKA.  Shortness of Breath, L pleural effusion, Possible mass in L pulmonary hilum, COPD: SOB appears to have improved some, currently on 2L [place] satting 96-98% at rest.  - Pulmonology consulted and performed a bedside U/S, felt neither a bronchoscopy or thoracentesis would be beneficial.  - Continue home Symbicort - Scheduled Duoneb q6h and Albuterol q2h PRN - Continue home Lasix 40 mg PO daily - O2 prn (uses O2 at home) - palliative consult to see pt today  Chest Pain: Resolved. EKG with mild ST elevation in the anteroseptal leads with new LBBB. Troponins 0.05 persistently overnight. Likely GERD give location this AM, but consider cardiac etiology with EKG changes. Repeat ECHO with an EF 81-01%, grade 1 diastolic dysfunction, and moderate pericardial effusion with no evidence of tamponade.  - ASA  - Continue home pepcid and protonix  Atrial fibrillation: New onset on 8/31. Reverted back to NSR on dilt drip, currently on coreg and continues to be in NSR.  - in the future try to avoid diltiazem given h/o decreased EF  - per cardiology, if rhythm becomes an issue, could consider  Amiodarone for a palliative basis (cocnerns over ILD).   Constipation, possible mass in LLQ: Mass possibly stool burden vs tumor - Abdominal CT w/o contrast today.  - Dulcolax enema PRN  Urinary tract infection: difficult to assess if this is asymptomatic bacteruria vs UTI as patient not able to give a good history. S/p ceftriaxone in the ED which was continued yesterday when urine culture resulted back to >100,000 cfu of Ecoli - Transition from ceftriaxone to Keflex today.    Hypokalemia: K 3.6 today. - Continue to monitor  DM: On Lantus 4U qhs at home. - Continue home Lantus - SSI  HLD: On Crestor 20 mg daily at home. - Continue home Crestor  CKD Stage III: At baseline - Avoid  nephrotoxic agents - Continue to monitor  FEN/GI: NPO pending CT abdomen/pelvis PPx: Heparin per pharmacy   Archie Patten, MD PGY-2,  [place] [facility] 10/19/2014  12:01 PM

## 2014-10-20 DIAGNOSIS — Z515 Encounter for palliative care: Secondary | ICD-10-CM | POA: Insufficient documentation

## 2014-10-20 DIAGNOSIS — I2584 Coronary atherosclerosis due to calcified coronary lesion: Secondary | ICD-10-CM

## 2014-10-20 DIAGNOSIS — R918 Other nonspecific abnormal finding of lung field: Secondary | ICD-10-CM

## 2014-10-20 LAB — GLUCOSE, CAPILLARY
GLUCOSE-CAPILLARY: 153 mg/dL — AB (ref 65–99)
GLUCOSE-CAPILLARY: 145 mg/dL — AB (ref 65–99)
Glucose-Capillary: 132 mg/dL — ABNORMAL HIGH (ref 65–99)
Glucose-Capillary: 149 mg/dL — ABNORMAL HIGH (ref 65–99)

## 2014-10-20 LAB — TROPONIN I
TROPONIN I: 0.03 ng/mL (ref <0.031)
TROPONIN I: 0.03 ng/mL (ref <0.031)
Troponin I: 0.03 ng/mL (ref <0.031)

## 2014-10-20 MED ORDER — BACLOFEN 5 MG HALF TABLET
5.0000 mg | ORAL_TABLET | Freq: Once | ORAL | Status: AC
  Administered 2014-10-20: 5 mg via ORAL
  Filled 2014-10-20: qty 1

## 2014-10-20 MED ORDER — BISACODYL 10 MG RE SUPP
10.0000 mg | Freq: Once | RECTAL | Status: DC
  Filled 2014-10-20 (×2): qty 1

## 2014-10-20 MED ORDER — POLYETHYLENE GLYCOL 3350 17 G PO PACK
17.0000 g | PACK | Freq: Every day | ORAL | Status: DC
  Administered 2014-10-21 – 2014-10-24 (×4): 17 g via ORAL
  Filled 2014-10-20 (×7): qty 1

## 2014-10-20 MED ORDER — CARVEDILOL 6.25 MG PO TABS
9.3750 mg | ORAL_TABLET | Freq: Two times a day (BID) | ORAL | Status: DC
  Administered 2014-10-20 – 2014-10-24 (×8): 9.375 mg via ORAL
  Filled 2014-10-20 (×16): qty 1

## 2014-10-20 MED ORDER — BACLOFEN 5 MG HALF TABLET
5.0000 mg | ORAL_TABLET | Freq: Once | ORAL | Status: DC
  Filled 2014-10-20: qty 1

## 2014-10-20 NOTE — Progress Notes (Signed)
Family Medicine Teaching Service Daily Progress Note Intern Pager: [phone]  Patient name: Suzanne [name] Medical record number: [id number] Date of birth: Apr 16, 1921 Age: 81 y.o. Gender: female  Primary Care Provider: Jenny Reichmann, MD Consultants: Palliative Care, Cardiology, Pulmonology Code Status: DNR  Pt Overview and Major Events to Date:  8/30: Admitted with SOB. CXR with left effusion with left lower lobe collapse; CT notable for abnormal soft tissue at the left pulmonary hilum extending into the mediastinum. 8/31: Continued constipation with hard induration in LLQ. New-onset paroxysmal afib with RVR, put on diltiazem drip with successful conversion to sinus rhythm. Started Coreg. 9/1: Abdominal CT notable only for significant stool burden. BM x2. Rate well-controlled on Coreg.  Assessment and Plan:  Suzanne [name] is a 81 y.o. female presenting with two weeks of progressive shortness of breath and imaging concerning for left-sided effusion and an abnormal soft tissue mass at the left pulmonary hilum; patient and family deferring further work up and requesting palliative care. PMH is significant for CHF, CAD, COPD, HTN, DM, aortic aneurysm, and peripheral vascular disease s/p R leg AKA.  Shortness of Breath, Suspicious Lung Mass with Effusion: Patient without improvement in dyspnea overnight. Satting well on 2L [place] continuously. - Palliative Care consulted, to see patient today - Continue home Symbicort - Continue Duoneb q6h and Albuterol q2h PRN - On 2L O2 at present and sating well, home O2 2.5L. Will titrate up as necessary.  Paroxysmal Atrial Fibrillation with RVR: Patient sufficiently rate-controlled overnight on Coreg 6.25 mg BID; continues to be in NSR. Did have a few episodes of a fib up 110 but resolved spontaneously.  - Appreciate Cardiology recommendations - Continue Coreg 6.25 mg BID, titrate as appropriate with goal HR <100  Chest Pain: Difficulty explaining  palpitations and chest pressure. - Will obtain EKG and troponins today to make sure pain is not cardiac in nature - Continue home pepcid and protonix  Constipation: Patient with two BMs yesterday. Abdominal CT notable for stool burden with possible muscle spasm. - Dulcolax enema PRN  Lower Back Pain: Likely secondary to immobilization. No bony lesions noted on previous imaging. - Baclofen 5 mg once - Heating pads, OOB as tolerated  EKG Abnormality, Acute Systolic CHF: Patient and family wish to defer workup of new LBBB and ECHO notable for HFrEF (EF 35-40%), and rather will be pursuing palliative care. - Appreciate cardiology recommendations - Coreg as above - Continue home Lasix 40 mg PO daily - Continue home ASA 325 mg - Will consider hydralazine or nitrate as BP allows  UTI: Urine culture with >100,000 CFU e. Coli, pan sensitive. She has received 2 days of IV Ceftriaxone and 1 day Keflex. - Transition to PO Keflex to complete 5 days of antibiotics (8/30-9/3)  Hypokalemia: Resolved.  DM: On Lantus 4U qhs at home. A1c 7.3 this admission. - Continue home Lantus - SSI  HLD: On Crestor 20 mg daily at home. - Continue home Crestor  CKD Stage III: Creatinine on admission 1.59, which appears to be near her baseline.  - Continue to monitor  FEN/GI: Regular Diet PPx: Heparin per pharmacy   Disposition: Discharge pending palliative care consult.  Subjective:  Overnight, patient with continued shortness of breath that is made worse with exertion. Endorses occasional palpitations overnight; some chest pressure, though she has difficulty describing this. Also with new-onset back pain throughout the day that she attributes to lying back in bed. She is anxious to return to her home and to  sit in her wheelchair. Received Dulcolax suppository x1 yesterday with subsequent large BM, also with a second BM later in the evening. Still feels constipated with abdominal bloating. Denies nausea and  vomiting.  Objective: Temp:  [97.6 F (36.4 C)-98.1 F (36.7 C)] 98.1 F (36.7 C) (09/02 0704) Pulse Rate:  [79-99] 79 (09/02 0704) Resp:  [18-20] 18 (09/02 0704) BP: (113-115)/(53-65) 115/53 mmHg (09/02 0704) SpO2:  [93 %-100 %] 98 % (09/02 0704)  Physical Exam: General: Patient appears tired today, no acute distress. Sitting up comfortably in bed preparing to eat breakfast. Cardiovascular: Regular rate and rhythm. 3/6 holosystolic murmur throughout. Radial pulses 2+ bilaterally. Respiratory: Normal work of breathing at rest on 2L [place]. Decreased breath sounds over the left lung diffusely. No wheezes appreciated. Mild crackles over the left lung base. Abdomen: Soft and non-distended. +BS. Previously palpated abdominal hard distension in the LLQ no longer palpable. Extremities: Warm and well-perfused. R AKA. No L calf tenderness.  Laboratory: No new labs overnight.  Imaging:   Ct Abdomen Pelvis Wo Contrast  10/19/2014   CLINICAL DATA:  Chest burning. Constipation with abnormal stools. Pleural effusion. Subsequent encounter.  EXAM: CT ABDOMEN AND PELVIS WITHOUT CONTRAST  TECHNIQUE: Multidetector CT imaging of the abdomen and pelvis was performed following the standard protocol without IV contrast.  COMPARISON:  CT abdomen and pelvis 06/29/2009. CT abdomen 04/20/2007. CT chest 10/17/2014.  FINDINGS: There is partial visualization of a left pleural effusion which is at least moderate in size. The appearance is not changed compared to the prior chest CT. Very small right pleural effusion is also again seen. Small to moderate pericardial effusion is identified, unchanged. There is cardiomegaly. Calcific aortic and coronary atherosclerosis is noted.  The patient is status post cholecystectomy. The liver, adrenal glands and pancreas are unremarkable. Bilateral renal cysts are unchanged. A few calcifications are seen in the spleen consistent with old granulomatous disease. There is aortoiliac  atherosclerosis with an aortoiliac graft in place. No aneurysm.  The patient is status post hysterectomy. There is a segment of narrowing of the proximal ascending colon which has an appearance most suggestive of spasm. Contrast material is present throughout the colon and the colon is otherwise unremarkable. The stomach and small bowel appear normal. No lymphadenopathy or fluid is identified.  No lytic or sclerotic bony lesions seen. Remote T12 superior endplate compression fractures noted.  IMPRESSION: Segmental narrowing of the proximal ascending colon has an appearance most suggestive of spasm although it cannot be definitively characterized.  Atherosclerotic vascular disease.  No change in a left pleural effusion and left basilar airspace disease.  No change in a small to moderate pericardial effusion.   Electronically Signed   By: Inge Rise M.D.   On: 10/19/2014 13:57    Orion Crook, Med Student 10/20/2014, 7:22 AM MS4, [place] Intern pager: [phone], text pages welcome  RESIDENT ADDENDUM  I have separately seen and examined the patient. I have discussed the findings and exam with the medical student and agree with the above note, which I have edited appropriately. I helped develop the management plan that is described in the student's note, and I agree with the content.  Additionally I have outlined my exam and assessment/plan below:   PE General: Lying in bed in NAD. Non-toxic Eyes: Conjunctivae non-injected.  Neck: Supple, no LAD Cardiovascular: RRR. 2/6 systolic murmur. 1+ DP pulse on the left. Respiratory: No increased WOB at rest. End expiratory wheezing throughout. Decreased breath sounds on  the L compared to the R. Crackles in the left in the base up to mid lung.  Abdomen:+bowel sounds. Soft NT/ND. MSK: R AKA site well healed. Violaceous changes to the L LE.   Suzanne [name] is a 81 y.o. female presenting with SOB, large left-sided effusion and an  abnormal soft tissue mass at the left pulmonary hilum. PMH is significant for CHF, COPD, HTN, DM, aortic aneurysm, and PAD s/p R leg AKA.  Shortness of Breath, L pleural effusion, Possible mass in L pulmonary hilum, COPD: SOB appears to have improved some, currently on 2L [place] satting 96-98% at rest.  - Pulmonology consulted and performed a bedside U/S, felt neither a bronchoscopy or thoracentesis would be beneficial.  - Continue home Symbicort - Scheduled Duoneb q6h and Albuterol q2h PRN - Continue home Lasix 40 mg PO daily - O2 prn (uses O2 at home) - palliative consult to see pt today  Chest Pain: New symptoms again. EKG with mild ST elevation in the anteroseptal leads with new LBBB. Likely GERD give location this AM vs MSK vs lung etiology, but consider cardiac etiology with EKG changes. Repeat ECHO with an EF 10-07%, grade 1 diastolic dysfunction, and moderate pericardial effusion with no evidence of tamponade.  - STAT EKG - trend troponins - ASA  - Continue home pepcid and protonix  Atrial fibrillation: New onset on 8/31. Reverted back to NSR on dilt drip, currently on coreg and continues to be in NSR.  - in the future try to avoid diltiazem given h/o decreased EF  - per cardiology, if rhythm becomes an issue, could consider Amiodarone for a palliative basis (cocnerns over ILD).   Constipation, possible mass in LLQ: Abdominal CT w/o mass, only spasm and stool - Dulcolax enema PRN  Urinary tract infection: difficult to assess if this is asymptomatic bacteruria vs UTI as patient not able to give a good history. S/p ceftriaxone in the ED which was continued yesterday when urine culture resulted back to >100,000 cfu of Ecoli - continue Keflex today.   Hypokalemia: stable - Continue to monitor  DM: On Lantus 4U qhs at home. - Continue home Lantus - SSI  HLD: On Crestor 20 mg daily at home. - Continue home Crestor  CKD Stage III: At baseline - Avoid nephrotoxic agents -  Continue to monitor  FEN/GI: reg diet PPx: Heparin per pharmacy  Archie Patten, MD PGY-2,  [facility] 10/20/2014  9:57 AM

## 2014-10-20 NOTE — Progress Notes (Signed)
Patient: Suzanne [name] / Admit Date: 10/17/2014 / Date of Encounter: 10/20/2014, 8:30 AM   Subjective: Feeling "OK" this morning. She is not sure if she had any symptoms with last night's arrhythmia. In retrospect she thinks she may have felt increased SOB.   Objective: Telemetry: NSR this AM but had paroxysms of atrial fib last night on telemetry intermittently, HR up to the 120s. Physical Exam: Blood pressure 115/53, pulse 79, temperature 98.1 F (36.7 C), temperature source Oral, resp. rate 18, height '5\' 2"'$  (1.575 m), weight 119 lb 11.2 oz (54.296 kg), SpO2 98 %. General: Thin frail appearing elderly WF in no acute distress. Head: Normocephalic, atraumatic, sclera non-icteric, no xanthomas, nares are without discharge. Neck: JVP not elevated. Lungs: Diffusely diminished. No wheezes, rales, rhonchi. Breathing is unlabored. Heart: RRR S1 S2 without murmurs, rubs, or gallops.  Abdomen: Soft, non-tender, non-distended with normoactive bowel sounds. No rebound/guarding. Extremities: No clubbing or cyanosis. No edema LLE. S/p R AKA. Warm. Neuro: Alert and oriented X 3. Moves all extremities spontaneously. Somewhat hard of hearing. Psych: Responds to questions appropriately with a normal affect.   Intake/Output Summary (Last 24 hours) at 10/20/14 0830 Last data filed at 10/20/14 0211  Gross per 24 hour  Intake    236 ml  Output    400 ml  Net   -164 ml    Inpatient Medications:  . aspirin EC  325 mg Oral Daily  . baclofen  5 mg Oral Once  . budesonide-formoterol  2 puff Inhalation BID  . carvedilol  6.25 mg Oral BID WC  . cephALEXin  250 mg Oral Q12H  . famotidine  10 mg Oral Daily  . ferrous sulfate  325 mg Oral Q breakfast  . furosemide  40 mg Oral Daily  . heparin subcutaneous  5,000 Units Subcutaneous 3 times per day  . insulin aspart  0-9 Units Subcutaneous TID WC  . insulin glargine  4 Units Subcutaneous QHS  . ipratropium-albuterol  3 mL Nebulization Q6H  .  pantoprazole  40 mg Oral Daily  . rosuvastatin  20 mg Oral Daily   Infusions:    Labs:  Recent Labs  10/18/14 0606 10/18/14 1610  NA 138 141  K 3.2* 3.6  CL 97* 100*  CO2 29 29  GLUCOSE 142* 98  BUN 27* 27*  CREATININE 1.65* 1.75*  CALCIUM 8.6* 9.0  MG  --  1.8   No results for input(s): AST, ALT, ALKPHOS, BILITOT, PROT, ALBUMIN in the last 72 hours.  Recent Labs  10/17/14 1402 10/18/14 0606  WBC 9.7 7.8  HGB 9.9* 9.5*  HCT 30.5* 30.7*  MCV 93.8 93.9  PLT 308 312    Recent Labs  10/18/14 0606 10/18/14 1610 10/18/14 2058 10/19/14 0400  TROPONINI 0.05* 0.05* 0.06* 0.06*   Invalid input(s): POCBNP  Recent Labs  10/18/14 0859  HGBA1C 7.3*     Radiology/Studies:  Ct Abdomen Pelvis Wo Contrast  10/19/2014   CLINICAL DATA:  Chest burning. Constipation with abnormal stools. Pleural effusion. Subsequent encounter.  EXAM: CT ABDOMEN AND PELVIS WITHOUT CONTRAST  TECHNIQUE: Multidetector CT imaging of the abdomen and pelvis was performed following the standard protocol without IV contrast.  COMPARISON:  CT abdomen and pelvis 06/29/2009. CT abdomen 04/20/2007. CT chest 10/17/2014.  FINDINGS: There is partial visualization of a left pleural effusion which is at least moderate in size. The appearance is not changed compared to the prior chest CT. Very small right pleural effusion is also again seen.  Small to moderate pericardial effusion is identified, unchanged. There is cardiomegaly. Calcific aortic and coronary atherosclerosis is noted.  The patient is status post cholecystectomy. The liver, adrenal glands and pancreas are unremarkable. Bilateral renal cysts are unchanged. A few calcifications are seen in the spleen consistent with old granulomatous disease. There is aortoiliac atherosclerosis with an aortoiliac graft in place. No aneurysm.  The patient is status post hysterectomy. There is a segment of narrowing of the proximal ascending colon which has an appearance most  suggestive of spasm. Contrast material is present throughout the colon and the colon is otherwise unremarkable. The stomach and small bowel appear normal. No lymphadenopathy or fluid is identified.  No lytic or sclerotic bony lesions seen. Remote T12 superior endplate compression fractures noted.  IMPRESSION: Segmental narrowing of the proximal ascending colon has an appearance most suggestive of spasm although it cannot be definitively characterized.  Atherosclerotic vascular disease.  No change in a left pleural effusion and left basilar airspace disease.  No change in a small to moderate pericardial effusion.   Electronically Signed   By: Inge Rise M.D.   On: 10/19/2014 13:57   Dg Chest 2 View  10/17/2014   CLINICAL DATA:  Chest pain with cough  EXAM: CHEST  2 VIEW  COMPARISON:  09/08/2013  FINDINGS: Cardiac enlargement. Moderately large left pleural effusion with left lower lobe collapse. Right lung is clear. No right pleural effusion. No definite edema. Vascularity appears normal.  Chronic compression fracture approximately L1 unchanged.  IMPRESSION: Moderately large left effusion with left lower lobe collapse. This could be due to underlying tumor or infection. Consider chest CT for further evaluation. Thoracentesis may also be helpful.   Electronically Signed   By: Franchot Gallo M.D.   On: 10/17/2014 14:07   Ct Chest Wo Contrast  10/17/2014   CLINICAL DATA:  LEFT pleural effusion  EXAM: CT CHEST WITHOUT CONTRAST  TECHNIQUE: Multidetector CT imaging of the chest was performed following the standard protocol without IV contrast. Sagittal and coronal MPR images reconstructed from axial data set.  COMPARISON:  03/15/2009; correlation chest radiograph 10/17/2014  FINDINGS: Scattered atherosclerotic calcifications aorta, proximal great vessels, and coronary arteries.  Aneurysmal dilatation ascending thoracic aorta 4.1 x 4.1 cm image 26.  Small pericardial effusion.  Upper normal caliber main pulmonary  artery.  Moderate LEFT pleural effusion.  Hila poorly assessed due to lack of IV contrast but abnormal soft tissue is seen at the LEFT hilum and AP window extending into the subcarinal region either representing tumor adenopathy.  This appearance represents an interval change since the prior CT.  Overall of the area of abnormal soft tissue measures up to 6.8 x 5.0 x 6.5 cm.  No definite pleural nodularity identified.  Visualized upper abdomen unremarkable.  Emphysematous changes greatest at apices.  Complete atelectasis of LEFT lower lobe with occlusion of LEFT lower lobe bronchus at its origin.  Scattered tracheobronchial cartilaginous calcification.  6 mm RIGHT lung base nodule image 35 stable since 2011.  No infiltrate or pneumothorax.  Mild superior endplate compression fracture of a lower thoracic vertebra unchanged since 2011.  Degenerative disc disease changes lower thoracic spine.  IMPRESSION: Abnormal soft tissue at the LEFT pulmonary hilum extending into the mediastinum and LEFT lower lobe with occlusion of the LEFT lower lobe bronchus at its origin, high suspicious for tumor though portions this may represent adenopathy.  Consider bronchoscopy.  Associated moderate LEFT pleural effusion with atelectasis of LEFT lower lobe.  Small pericardial effusion  with extensive atherosclerotic disease changes and mild aneurysmal dilatation of the ascending thoracic aorta, recommendation below.  Recommend annual imaging followup by CTA or MRA. This recommendation follows 2010 ACCF/AHA/AATS/ACR/ASA/SCA/SCAI/SIR/STS/SVM Guidelines for the Diagnosis and Management of Patients with Thoracic Aortic Disease. Circulation. 2010; 121: T597-C163   Electronically Signed   By: Lavonia Dana M.D.   On: 10/17/2014 16:35     Assessment and Plan  30F with mild CAD by cath 09/2008, previous diastolic CHF (now with new LV dysfunction), severe COPD (on home O2 nightly), CKD Stage 4, HTN, HLD,GERD, DM and PVD (left femoral popliteal  bypass graft 04/2013; s/p R AKA 09/2013, in wheelchair, renal artery stenosis) who presented to [facility] 10/17/14 with chest burning. She was found to have large pleural effusion and large left hilar mass extending into the mediastinum and LLL with complete LLL collapse, which pulm feels is likely cancer. Also noted to be in AF RVR around 1pm on 10/18/14. Note previously on Coumadin for PAD but has not taken any meds in years besides daily ASA '325mg'$  daily.  1. New onset atrial fibrillation with RVR -> spont converted to NSR, but paroxysms of AF noted on telemetry since then - at increased risk of cardioembolic events but with advanced age, frailty, decreased mobility, transferring in and out of wheelchair at home, and new lung mass concerning for malignancy, would be very hesitant to start on anticoagulation besides her '325mg'$  ASA she had been taking PTA - mostly in NSR since starting carvedilol, but had breakthrough PAF interspersed with NSR HR 110-120s on tele last night - will discuss addition of oral amiodarone with MD. BP does not particularly tolerate aggressive rate control titration.  2. Abnormal troponin in setting of mild CAD in 2010 and above comorbidities - one episode of atypical chest pain last week that was relieved with ginger ale and belching; no recurrence of pain since - troponin trend relatively flat, likely in setting of AF RVR and CKD - In light of new lung mass that is probably CA and patients request for no futher w/u or treatment of mass and advanced age and frail state, would not proceed with further cardiac w/u of CP  3. New LV dysfunction by echocardiogram - 2D Echo 10/18/14: mild LVH, EF 35-40%, grade 1 DD, mildly calcified AV annulus, mod calcified MV annulus, mod pericardial effusion - continue new Coreg and home Lasix; not on ACEI/ARB right now due to CKD and little room in BP to advance HR controlling medicines if needed  4. Large left pulmonary hilum mass on  CT, also with large left pleural effusion  - pulm signed off as patient does not want any chemo/radiation for her lung mass, therefore no role for diagnostic bx - daughter reports thoracentesis was cancelled - further per IM - I see palliative consult has been placed which I believe is very appropriate  5. Moderate pericardial effusion by echo 10/18/14 - no evidence of tamponade - in setting of hilar mass, malignant effusion is of concern. Patient does not wish anything further be done about lung mass so will not proceed with diagnostic pericardiocentesis since she does not want treatment for probable lung CA at this time.  Signed, Melina Copa PA-C Pager: [phone]   Patient seen and examined. Agree with assessment and plan. Currently sinus rhythm. Episode of recurrent PAF last night. Will try initially to slightly increase coreg to 9.375 mg bid as see if tolerates rather than initiate amiodarone with lung issues.  Troy Sine, MD, [facility] 10/20/2014 11:22 AM

## 2014-10-20 NOTE — Care Management Important Message (Signed)
Important Message  Patient Details  Name: KEEGHAN MCINTIRE MRN: [id number] Date of Birth: 03-10-1921   Medicare Important Message Given:  Yes-second notification given    Nathen May 10/20/2014, 10:35 AMImportant Message  Patient Details  Name: SHAQUAYA WUELLNER MRN: [id number] Date of Birth: 09/12/1921   Medicare Important Message Given:  Yes-second notification given    Nathen May 10/20/2014, 10:34 AM

## 2014-10-20 NOTE — Care Management Note (Signed)
Case Management Note Marvetta Gibbons RN, BSN Unit 2W-Case Manager [phone]  Patient Details  Name: RUQAYYAH LUTE MRN: [id number] Date of Birth: 1921/07/02  Subjective/Objective:    Pt admitted with Pleural effusion/SOB                Action/Plan: PTA pt lived at home alone, per daughterBarnett Applebaum, mostly in w/c- PC mtg has been order- plan to meet later this afternoon-- NCM to f/u after PC mtg to see what decisions are made regarding d/c plans. Daughter not sure at this time if her mother can return home.   Expected Discharge Date:                  Expected Discharge Plan:  [place]  In-House Referral:  Hospice / Palliative Care  Discharge planning Services  CM Consult  Post Acute Care Choice:    Choice offered to:     DME Arranged:    DME Agency:     HH Arranged:    HH Agency:     Status of Service:  In process, will continue to follow  Medicare Important Message Given:  Yes-second notification given Date Medicare IM Given:    Medicare IM give by:    Date Additional Medicare IM Given:    Additional Medicare Important Message give by:     If discussed at [place] of Stay Meetings, dates discussed:    Additional Comments:  Dawayne Patricia, RN 10/20/2014, 11:16 AM

## 2014-10-20 NOTE — Consult Note (Signed)
Consultation Note Date: 10/20/2014   Patient Name: Suzanne [name]  DOB: [birth year]  MRN: [id number]  Age / Sex: 81 y.o., female   PCP: Darlyne Russian, MD Referring Physician: Kinnie Feil, MD  Reason for Consultation: Establishing goals of care  Palliative Care Assessment and Plan Summary of Established Goals of Care and Medical Treatment Preferences   Clinical Assessment/Narrative: Patient is a 81 year old female with a history of diastolic heart failure, hypertension,  severe COPD on home oxygen, diabetes, who presented to the hospital on August 30 with a 2 week history of increasing shortness of breath. She was found to have a moderate size left pleural effusion and a large left hilar pulmonary mass extending into the mediastinum and left lower lobe with complete collapse of the left lower lobe. Patient also has a history of stage IV chronic kidney disease. Since being in the hospital she has developed a new onset of atrial fibrillation with RVR. Patient has decided against further workup and treatment. Thoracentesis was canceled. Prior to this patient had been living alone with the help of her daughter coming in to check on her and help her with meals. Patient is a right above-the-knee amputee and has been able to toilet herself and pivot to a wheelchair independently. She is now too short of breath to manage this. Daughter is very tearful when she thinks about her mother's prognosis of probable cancer. Patient states "it is what it is".  Contacts/Participants in Discussion: Primary Decision Maker: Patient at this point can make her own decisions   HCPOA: yes  Daughter [employer] power of attorney and present for discussion  Code Status/Advance Care Planning:  DO NOT RESUSCITATE  Transfer to hospice inpatient facility when maximum medical management has been achieved in terms of cardio pulmonary management  Symptom Management:   Pain: Patient reports since getting her  leg amputated she has not had any further right leg pain or back pain.  Shortness of breath: Introduce the idea of opioids to manage shortness of breath in addition to targeted pulmonary treatments  Palliative Prophylaxis: Restart MiraLAX  Additional Recommendations (Limitations, Scope, Preferences):  n/a Psycho-social/Spiritual:   Support System: Yes  Desire for further Chaplaincy support:no  Prognosis: 2-4 weeks barring an acute event. Patient certainly meets hospice criteria for in-home care, but likly meets inpatient criteria secondary to high risk of acute cardiopulmonary failure secondary to  new diagnosis of lung cancer with complete complete collapse of left lower lobe lung, new onset of atrial fibrillation with RVR , stage IV chronic kidney disease,  COPD 02 dependant, and above the knee right amputation  Discharge Planning:  Hospice facility       Chief Complaint/History of Present Illness: Patient is a 81 year old female with a history of diastolic heart failure stage IV chronic kidney disease, hypertension, severe COPD on home oxygen, admitted with a 2 week history of worsening shortness of breath. She was found to have a moderate size left pleural effusion and new finding of large left hilar pulmonary mass extending into the mediastinum and left lower lobe with complete left lower lobe collapse.  Primary Diagnoses  Present on Admission:  . Pleural effusion . CAD (coronary artery disease)  Palliative Review of Systems: Patient denies pain but she is verbalizing shortness of breath I have reviewed the medical record, interviewed the patient and family, and examined the patient. The following aspects are pertinent.  Past Medical History  Diagnosis Date  . PVD (peripheral vascular disease)  a. PTA & stent right superficial artery PTA & left common iliac artery stenting. Right fem-pop bypass graft. b. Per Dr. Kennon Holter note: h/o aortobifemoral bypass grafting remotely as  well as fem-peroneal bypass grafting.  . Mixed hyperlipidemia   . Compression fx, thoracic spine     T12  . Ischium fracture   . Memory loss, short term   . Pulmonary nodule   . Chronic diastolic CHF (congestive heart failure) 09-2008    a. EF 40% by cath 2010. b. Improved - last echo 2015 with normal EF.  [name] Anemia   . Detached retina 02-2005    R EYE  . Fracture 02-2007    L5 AND PELVIS  . Left ventricular dysfunction     a. EF 40% by cath 2010. b. Improved - last echo 2015 with normal EF.   [name] AAA (abdominal aortic aneurysm)     Repair unknown date  . Critical lower limb ischemia   . Chronic respiratory failure     a. On home O2.  [name] Hypertension   . Warfarin anticoagulation     DC summary from 2010 reports she is "on chronic  Coumadin for vascular disease." This is listed on her med list going back to 2006 by records available. Teaching service H&P remotely says she has h/o DVT but I cannot find any evidence of this. Admission H/P 03/2013 suggests history of atrial fib but all ECGs appear sinus and there is no mention of this previously.  [name] COPD (chronic obstructive pulmonary disease)   . Type II or unspecified type diabetes mellitus without mention of complication, not stated as uncontrolled   . Complication of anesthesia     hard to wake up, bp problems   . Pneumonia     hx  . GERD (gastroesophageal reflux disease)   . Arthritis   . CAD (coronary artery disease) 10/12/2008    a. 30% LAD,30% CX & 40% tandem mid stenosis in the AV groove, 50-60% RCA  . Renal artery stenosis     a. 1-59% by doppler in 2014.; CKD stage III (Dr. Erling Cruz)  . On home oxygen therapy     "2.5L O2 at sleep or w/increased activity" (09/27/2013)  . History of right above knee amputation     performed by Dr. Gae Gallop August 2014  . Pleural effusion 09/2014  . Shortness of breath dyspnea   . UTI (urinary tract infection)   . PAF (paroxysmal atrial fibrillation) 10/19/2014   Social History    Social History  . Marital Status: Widowed    Spouse Name: N/A  . Number of Children: N/A  . Years of Education: N/A   Social History Main Topics  . Smoking status: Former Smoker -- 2.00 packs/day for 40 years    Types: Cigarettes    Quit date: 02/16/1997  . Smokeless tobacco: Never Used  . Alcohol Use: No  . Drug Use: No  . Sexual Activity: No   Other Topics Concern  . None   Social History Narrative   Lives by self, cares for self, drives.     Family History  Problem Relation Age of Onset  . Tuberculosis Mother   . Cancer Father    Scheduled Meds: . aspirin EC  325 mg Oral Daily  . baclofen  5 mg Oral Once  . baclofen  5 mg Oral Once  . bisacodyl  10 mg Rectal Once  . budesonide-formoterol  2 puff Inhalation BID  . carvedilol  9.375  mg Oral BID WC  . cephALEXin  250 mg Oral Q12H  . famotidine  10 mg Oral Daily  . ferrous sulfate  325 mg Oral Q breakfast  . furosemide  40 mg Oral Daily  . heparin subcutaneous  5,000 Units Subcutaneous 3 times per day  . insulin aspart  0-9 Units Subcutaneous TID WC  . insulin glargine  4 Units Subcutaneous QHS  . ipratropium-albuterol  3 mL Nebulization Q6H  . pantoprazole  40 mg Oral Daily  . rosuvastatin  20 mg Oral Daily   Continuous Infusions:  PRN Meds:.acetaminophen, albuterol, bisacodyl Medications Prior to Admission:  Prior to Admission medications   Medication Sig Start Date End Date Taking? Authorizing Provider  acetaminophen (TYLENOL) 500 MG tablet Take 1,000 mg by mouth every 4 (four) hours as needed for moderate pain.    Yes Historical Provider, MD  albuterol (PROVENTIL) (2.5 MG/3ML) 0.083% nebulizer solution Take 2.5 mg by nebulization 3 (three) times daily as needed for wheezing or shortness of breath.    Yes Historical Provider, MD  aspirin EC 325 MG EC tablet Take 1 tablet (325 mg total) by mouth daily. 04/05/13  Yes Dayna N Dunn, PA-C  budesonide-formoterol (SYMBICORT) 160-4.5 MCG/ACT inhaler Inhale 2 puffs  into the lungs 2 (two) times daily. 06/29/14  Yes Darlyne Russian, MD  Calcium Carbonate-Vitamin D (CALCIUM 600+D) 600-400 MG-UNIT per tablet Use bid Patient taking differently: Take 1 tablet by mouth 2 (two) times daily. Use bid 12/16/13  Yes Darlyne Russian, MD  CRESTOR 20 MG tablet TAKE 1 TABLET DAILY 10/03/14  Yes Darlyne Russian, MD  esomeprazole (NEXIUM) 40 MG capsule Take 1 capsule (40 mg total) by mouth daily before breakfast. 12/16/13  Yes Darlyne Russian, MD  ferrous sulfate 325 (65 FE) MG tablet Take 1 tablet (325 mg total) by mouth daily with breakfast. 12/16/13  Yes Darlyne Russian, MD  furosemide (LASIX) 40 MG tablet Take 1 tablet (40 mg total) by mouth daily. 12/16/13  Yes Darlyne Russian, MD  Insulin Glargine (LANTUS SOLOSTAR) 100 UNIT/ML Solostar Pen Inject 8 Units into the skin daily. 06/29/14  Yes Darlyne Russian, MD  Ipratropium-Albuterol (COMBIVENT RESPIMAT) 20-100 MCG/ACT AERS respimat Inhale 2 puffs into the lungs every 6 (six) hours as needed for wheezing or shortness of breath. 12/16/13  Yes Darlyne Russian, MD  lidocaine (LIDODERM) 5 % Place 1 patch onto the skin daily. Remove & Discard patch within 12 hours or as directed by MD 06/29/14  Yes Darlyne Russian, MD  Omega-3 Fatty Acids (FISH OIL) 1200 MG CAPS Take 1 capsule (1,200 mg total) by mouth 2 (two) times daily. 06/29/14  Yes Darlyne Russian, MD  ranitidine (ZANTAC) 150 MG tablet Take 1 tablet (150 mg total) by mouth 2 (two) times daily. 12/16/13  Yes Darlyne Russian, MD  Alcohol Swabs (ALCOHOL PREP) 70 % PADS To use before injecting lantus 12/16/13   Darlyne Russian, MD  Insulin Pen Needle (PEN NEEDLES 3/16") 31G X 5 MM MISC To use once daily with LANTUS pens 12/16/13   Darlyne Russian, MD   Allergies  Allergen Reactions  . Codeine Other (See Comments)    REACTION: hallucinations  . Hydrochlorothiazide Other (See Comments)    REACTION: hypotension  . Pregabalin Other (See Comments)    REACTION: unknown  . Other Rash    Adhesives and bandages  gives pt a rash.   CBC:    Component Value Date/Time  WBC 7.8 10/18/2014 0606   WBC 11.4* 01/25/2013 1714   HGB 9.5* 10/18/2014 0606   HGB 10.4* 01/25/2013 1714   HCT 30.7* 10/18/2014 0606   HCT 34.8* 01/25/2013 1714   PLT 312 10/18/2014 0606   MCV 93.9 10/18/2014 0606   MCV 101.3* 01/25/2013 1714   NEUTROABS 4.9 06/29/2014 1107   LYMPHSABS 1.7 06/29/2014 1107   MONOABS 0.5 06/29/2014 1107   EOSABS 0.2 06/29/2014 1107   BASOSABS 0.1 06/29/2014 1107   Comprehensive Metabolic Panel:    Component Value Date/Time   NA 141 10/18/2014 1610   NA 145 08/01/2011   K 3.6 10/18/2014 1610   CL 100* 10/18/2014 1610   CO2 29 10/18/2014 1610   BUN 27* 10/18/2014 1610   BUN 37* 08/01/2011   CREATININE 1.75* 10/18/2014 1610   CREATININE 1.96* 06/29/2014 1107   CREATININE 1.5* 08/01/2011   GLUCOSE 98 10/18/2014 1610   CALCIUM 9.0 10/18/2014 1610   AST 21 12/16/2013 1355   ALT 10 12/16/2013 1355   ALKPHOS 59 12/16/2013 1355   BILITOT 0.3 12/16/2013 1355   PROT 6.5 12/16/2013 1355   ALBUMIN 4.1 12/16/2013 1355    Physical Exam: Vital Signs: BP 115/53 mmHg  Pulse 79  Temp(Src) 98.1 F (36.7 C) (Oral)  Resp 18  Ht '5\' 2"'$  (1.575 m)  Wt 54.296 kg (119 lb 11.2 oz)  BMI 21.89 kg/m2  SpO2 98% SpO2: SpO2: 98 % O2 Device: O2 Device: Nasal Cannula O2 Flow Rate: O2 Flow Rate (L/min): 2 L/min Intake/output summary:  Intake/Output Summary (Last 24 hours) at 10/20/14 1345 Last data filed at 10/20/14 0905  Gross per 24 hour  Intake    476 ml  Output    400 ml  Net     76 ml   LBM: Last BM Date: 10/19/14 Baseline Weight: Weight: 54.296 kg (119 lb 11.2 oz) Most recent weight: Weight: 54.296 kg (119 lb 11.2 oz)  Exam Findings:  General: Patient is a frail elderly female with mild work of breathing observed at rest Respiratory: Mild work of breathing observed at rest in the bed  Neuro: She is hard of hearing. She is oriented to person place and situation         Palliative  Performance Scale: 30%              Additional Data Reviewed: Recent Labs     10/17/14  1402  10/18/14  0606  10/18/14  1610  WBC  9.7  7.8   --   HGB  9.9*  9.5*   --   PLT  308  312   --   NA  136  138  141  BUN  29*  27*  27*  CREATININE  1.59*  1.65*  1.75*     Time In: 4:30 PM Time Out: 6 PM Time Total: 90 minutes Greater than 50%  of this time was spent counseling and coordinating care related to the above assessment and plan.  Signed by: Dory Horn, NP  Dory Horn, NP  10/20/2014, 1:45 PM  Please contact Palliative Medicine Team phone at [phone] for questions and concerns.

## 2014-10-21 LAB — BASIC METABOLIC PANEL
ANION GAP: 12 (ref 5–15)
BUN: 40 mg/dL — ABNORMAL HIGH (ref 6–20)
CHLORIDE: 96 mmol/L — AB (ref 101–111)
CO2: 27 mmol/L (ref 22–32)
Calcium: 8.7 mg/dL — ABNORMAL LOW (ref 8.9–10.3)
Creatinine, Ser: 1.61 mg/dL — ABNORMAL HIGH (ref 0.44–1.00)
GFR calc non Af Amer: 27 mL/min — ABNORMAL LOW (ref >60)
GFR, EST AFRICAN AMERICAN: 31 mL/min — AB (ref >60)
Glucose, Bld: 168 mg/dL — ABNORMAL HIGH (ref 65–99)
POTASSIUM: 3.9 mmol/L (ref 3.5–5.1)
Sodium: 135 mmol/L (ref 135–145)

## 2014-10-21 LAB — GLUCOSE, CAPILLARY
Glucose-Capillary: 123 mg/dL — ABNORMAL HIGH (ref 65–99)
Glucose-Capillary: 180 mg/dL — ABNORMAL HIGH (ref 65–99)
Glucose-Capillary: 155 mg/dL — ABNORMAL HIGH (ref 65–99)

## 2014-10-21 LAB — CBC
HEMATOCRIT: 30.6 % — AB (ref 36.0–46.0)
HEMOGLOBIN: 9.5 g/dL — AB (ref 12.0–15.0)
MCH: 29 pg (ref 26.0–34.0)
MCHC: 31 g/dL (ref 30.0–36.0)
MCV: 93.3 fL (ref 78.0–100.0)
Platelets: 314 10*3/uL (ref 150–400)
RBC: 3.28 MIL/uL — AB (ref 3.87–5.11)
RDW: 12.7 % (ref 11.5–15.5)
WBC: 8.6 10*3/uL (ref 4.0–10.5)

## 2014-10-21 NOTE — Progress Notes (Signed)
   Subjective: No CP  Breathing comfortably in bed   Objective: Filed Vitals:   10/20/14 2147 10/21/14 0203 10/21/14 0512 10/21/14 0859  BP:   112/53   Pulse: 78 76 71 76  Temp:   97.8 F (36.6 C)   TempSrc:   Oral   Resp: '18 16 18 18  '$ Height:      Weight:      SpO2: 98% 97% 98% 99%   Weight change:   Intake/Output Summary (Last 24 hours) at 10/21/14 0950 Last data filed at 10/21/14 0700  Gross per 24 hour  Intake    300 ml  Output    950 ml  Net   -650 ml    General: Alert, awake, oriented x3, in no acute distress Neck:  JVP is normal Heart: Regular rate and rhythm, without murmurs, rubs, gallops.  Lungs: Clear to auscultation.  No rales or wheezes. Exemities:  No edema.   Neuro: Grossly intact, nonfocal.  Tele:  SR    Lab Results: Results for orders placed or performed during the hospital encounter of 10/17/14 (from the past 24 hour(s))  Glucose, capillary     Status: Abnormal   Collection Time: 10/20/14 11:21 AM  Result Value Ref Range   Glucose-Capillary 149 (H) 65 - 99 mg/dL  Troponin I (q 6hr x 3)     Status: None   Collection Time: 10/20/14 11:30 AM  Result Value Ref Range   Troponin I 0.03 <0.031 ng/mL  Glucose, capillary     Status: Abnormal   Collection Time: 10/20/14  4:31 PM  Result Value Ref Range   Glucose-Capillary 153 (H) 65 - 99 mg/dL  Troponin I (q 6hr x 3)     Status: None   Collection Time: 10/20/14  5:03 PM  Result Value Ref Range   Troponin I 0.03 <0.031 ng/mL  Glucose, capillary     Status: Abnormal   Collection Time: 10/20/14  9:04 PM  Result Value Ref Range   Glucose-Capillary 145 (H) 65 - 99 mg/dL  Troponin I (q 6hr x 3)     Status: None   Collection Time: 10/20/14 10:26 PM  Result Value Ref Range   Troponin I 0.03 <0.031 ng/mL  Glucose, capillary     Status: Abnormal   Collection Time: 10/21/14  6:33 AM  Result Value Ref Range   Glucose-Capillary 123 (H) 65 - 99 mg/dL   Comment 1 Notify RN     Studies/Results: No results  found.  Medications: Reviewed  '@PROBHOSP'$ @  1  PAF  Currently in SR  Continue meds  Would not anticoagulation  Keep on ASA  2.  Chronic systolic CHF  Volume status looks good  ON coreg and lasix  Not on ACE /Arb  Follow BP    3  L hiar mass with pulm effusion.  NO plan for Rx. Palliative care  4.  MOd pericardial effusion.  May be related to 3.   No signs of hemodynamic compromise  FOllow  NO other recommendations.   .   LOS: 4 days   Suzanne [name] 10/21/2014, 9:50 AM

## 2014-10-21 NOTE — Progress Notes (Signed)
Family Medicine Teaching Service Daily Progress Note Intern Pager: [phone]  Patient name: Suzanne [name] Medical record number: [id number] Date of birth: July 21, 1921 Age: 81 y.o. Gender: female  Primary Care Provider: Jenny Reichmann, MD Consultants: Palliative Care, Cardiology, Pulmonology Code Status: DNR  Pt Overview and Major Events to Date:  8/30: Admitted with SOB. CXR with left effusion with left lower lobe collapse; CT notable for abnormal soft tissue at the left pulmonary hilum extending into the mediastinum. 8/31: Continued constipation with hard induration in LLQ. New-onset paroxysmal afib with RVR, put on diltiazem drip with successful conversion to sinus rhythm. Started Coreg. 9/1: Abdominal CT notable only for significant stool burden. BM x2. Rate well-controlled on Coreg. 9/3: Awaiting placement at hospice inpatient   Assessment and Plan:  Suzanne [name] is a 81 y.o. female presenting with two weeks of progressive shortness of breath and imaging concerning for left-sided effusion and an abnormal soft tissue mass at the left pulmonary hilum; patient and family deferring further work up and requesting palliative care. PMH is significant for CHF, CAD, COPD, HTN, DM, aortic aneurysm, and peripheral vascular disease s/p R leg AKA.  Shortness of Breath, Suspicious Lung Mass with Effusion: Patient without improvement in dyspnea overnight. Satting well on 2L [place] continuously. - Palliative Care consulted: Recs = hospice inpatient - Continue home Symbicort - Continue Duoneb q6h and Albuterol q2h PRN - On 2L O2 at present and sating well, home O2 2.5L.   Paroxysmal Atrial Fibrillation with RVR: Patient sufficiently rate-controlled overnight continues to be in NSR. Did have a few episodes of a fib up 110 but resolved spontaneously.  - Appreciate Cardiology recommendations - Continue Coreg 9.375 mg BID  Chest Pain: Difficulty explaining palpitations and chest pressure. - Will obtain EKG  and troponins today to make sure pain is not cardiac in nature - Continue home pepcid and protonix  Constipation: Patient with two BMs yesterday. Abdominal CT notable for stool burden with possible muscle spasm. - Dulcolax enema PRN  Lower Back Pain: Likely secondary to immobilization. No bony lesions noted on previous imaging. - Heating pads, OOB as tolerated  EKG Abnormality, Acute Systolic CHF: Patient and family wish to defer workup of new LBBB and ECHO notable for HFrEF (EF 35-40%), and rather will be pursuing palliative care. - Appreciate cardiology recommendations - Coreg as above - Continue home Lasix 40 mg PO daily - Continue home ASA 325 mg  UTI: Urine culture with >100,000 CFU e. Coli, pan sensitive. She has received 2 days of IV Ceftriaxone and 1 day Keflex. - PO Keflex to complete 5 days of antibiotics (8/30-9/3)  Hypokalemia: Resolved.  DM: On Lantus 4U qhs at home. A1c 7.3 this admission. - discontinue Lantus and SSI; continue to monitor CBGs; If BG increase will restart lantus w/o SSI - CBGs qam  HLD: On Crestor 20 mg daily at home. - Discontinue Crestor  CKD Stage III: Creatinine on admission 1.59, which appears to be near her baseline.  - Continue to monitor  FEN/GI: Regular Diet PPx: Heparin per pharmacy   Disposition: Discharge pending palliative care consult.  Subjective:  Overnight, patient with continued shortness of breath that is made worse with exertion. Endorses occasional palpitations overnight; some chest pressure, though she has difficulty describing this. Also with new-onset back pain throughout the day that she attributes to lying back in bed. She is anxious to return to her home and to sit in her wheelchair. Received Dulcolax suppository x1 yesterday with subsequent large  BM, also with a second BM later in the evening. Still feels constipated with abdominal bloating. Denies nausea and vomiting.  Objective: Temp:  [97.4 F (36.3 C)-97.8 F (36.6  C)] 97.8 F (36.6 C) (09/03 0512) Pulse Rate:  [71-80] 76 (09/03 0859) Resp:  [16-19] 18 (09/03 0859) BP: (112-113)/(53-54) 112/53 mmHg (09/03 0512) SpO2:  [97 %-100 %] 99 % (09/03 0859)  Physical Exam: General: Lying in bed in NAD. Non-toxic Eyes: Conjunctivae non-injected.  Neck: Supple, no LAD Cardiovascular: RRR. 2/6 systolic murmur. 1+ DP pulse on the left. Respiratory: No increased WOB at rest. End expiratory wheezing throughout. Decreased breath sounds on the L compared to the R. Crackles in the left in the base up to mid lung.  Abdomen:+bowel sounds. Soft NT/ND. MSK: R AKA site well healed. Violaceous changes to the L LE.   Laboratory:  Results for orders placed or performed during the hospital encounter of 10/17/14 (from the past 24 hour(s))  Glucose, capillary     Status: Abnormal   Collection Time: 10/20/14  4:31 PM  Result Value Ref Range   Glucose-Capillary 153 (H) 65 - 99 mg/dL  Troponin I (q 6hr x 3)     Status: None   Collection Time: 10/20/14  5:03 PM  Result Value Ref Range   Troponin I 0.03 <0.031 ng/mL  Glucose, capillary     Status: Abnormal   Collection Time: 10/20/14  9:04 PM  Result Value Ref Range   Glucose-Capillary 145 (H) 65 - 99 mg/dL  Troponin I (q 6hr x 3)     Status: None   Collection Time: 10/20/14 10:26 PM  Result Value Ref Range   Troponin I 0.03 <0.031 ng/mL  Glucose, capillary     Status: Abnormal   Collection Time: 10/21/14  6:33 AM  Result Value Ref Range   Glucose-Capillary 123 (H) 65 - 99 mg/dL   Comment 1 Notify RN   Glucose, capillary     Status: Abnormal   Collection Time: 10/21/14 11:37 AM  Result Value Ref Range   Glucose-Capillary 180 (H) 65 - 99 mg/dL   Comment 1 Notify RN    Imaging:   Olam Idler, MD PGY-3,  [facility] 10/21/2014  12:13 PM

## 2014-10-21 NOTE — Progress Notes (Signed)
Ms. Brogden is very upset that she is receiving so many medications. She wants her heparin and sliding scale insulin discontinued.  She would also like to talk to the physician about her desire to discontinue all other non-esential medications.

## 2014-10-21 NOTE — Progress Notes (Addendum)
Daily Progress Note   Patient Name: Suzanne [name]       Date: 10/21/2014 DOB: 1921-09-13  Age: 81 y.o. MRN#: [id number] Attending Physician: Kinnie Feil, MD Primary Care Physician: Jenny Reichmann, MD Admit Date: 10/17/2014  Reason for Consultation/Follow-up: Establishing goals of care and Inpatient hospice referral  Subjective: Patient appears more confused but still able to participate in assessment. She is eating only bites and sips. She has not been out of bed because of her shortness of breath. She is asking that any non-essential pills be stopped because of difficulty swallowing and associated shortness of breath.  Interval Events: None Length of Stay: 4 days  Current Medications: Scheduled Meds:  . aspirin EC  325 mg Oral Daily  . baclofen  5 mg Oral Once  . bisacodyl  10 mg Rectal Once  . budesonide-formoterol  2 puff Inhalation BID  . carvedilol  9.375 mg Oral BID WC  . cephALEXin  250 mg Oral Q12H  . famotidine  10 mg Oral Daily  . ferrous sulfate  325 mg Oral Q breakfast  . furosemide  40 mg Oral Daily  . heparin subcutaneous  5,000 Units Subcutaneous 3 times per day  . ipratropium-albuterol  3 mL Nebulization Q6H  . pantoprazole  40 mg Oral Daily  . polyethylene glycol  17 g Oral Daily    Continuous Infusions:    PRN Meds: acetaminophen, albuterol, bisacodyl  Palliative Performance Scale: 30 %     Vital Signs: BP 112/53 mmHg  Pulse 65  Temp(Src) 97.8 F (36.6 C) (Oral)  Resp 16  Ht '5\' 2"'$  (1.575 m)  Wt 54.296 kg (119 lb 11.2 oz)  BMI 21.89 kg/m2  SpO2 99% SpO2: SpO2: 99 % O2 Device: O2 Device: Nasal Cannula O2 Flow Rate: O2 Flow Rate (L/min): 2 L/min  Intake/output summary:  Intake/Output Summary (Last 24 hours) at 10/21/14 1414 Last data filed at 10/21/14 0700  Gross per 24 hour  Intake    300 ml  Output    950 ml  Net   -650 ml   LBM:   Baseline Weight: Weight: 54.296 kg (119 lb 11.2 oz) Most recent weight: Weight: 54.296 kg (119 lb  11.2 oz)  Physical Exam: Gen.: Frail elderly female. She is alert and slightly confused Respiratory: Mild work of breathing noted lying in bed Neuro: Short-term memory deficits noted              Additional Data Reviewed: Recent Labs     10/18/14  1610  NA  141  BUN  27*  CREATININE  1.75*     Problem List:  Patient Active Problem List   Diagnosis Date Noted  . Palliative care encounter   . PAF (paroxysmal atrial fibrillation) 10/19/2014  . Chronic systolic CHF (congestive heart failure) 10/19/2014  . CKD (chronic kidney disease), stage IV 10/19/2014  . Pericardial effusion   . Lung mass   . SOB (shortness of breath)   . Pain in the chest   . Type 2 diabetes mellitus with complication   . Constipation   . UTI (lower urinary tract infection)   . Pleural effusion 10/17/2014  . Diabetes mellitus due to underlying condition with circulatory complication 42/59/5638  . Essential hypertension, benign 11/16/2013  . Atherosclerotic PVD with ulceration 04/13/2013  . Long term (current) use of anticoagulants 05/12/2012  . Cyst, kidney, acquired 02/26/2012  . Kidney stone 02/26/2012  . Chronic kidney disease, stage 3 11/19/2011  . COPD (chronic  obstructive pulmonary disease) 03/18/2011  . Osteoporosis 12/17/2010  . PVD (peripheral vascular disease)   . Mixed hyperlipidemia   . Renal artery stenosis   . Memory loss, short term   . Pulmonary nodule   . CAD (coronary artery disease)   . CHF (congestive heart failure)   . Anemia      Palliative Care Assessment & Plan    Code Status:  DNR  Goals of Care:  Continue DO NOT RESUSCITATE  Goal to go to inpatient hospice wants full medical management achieved  No further workup for new lung mass with associated left pleural effusion and collapse of left lower lobe  No aggressive cardiac workup for new atrial fib with RVR; will continue Coreg  Symptom Management:  Dyspnea: Not improving.Continue with targeted pulmonary  treatments. Will add oxycodone '5mg'$  po q4 prn. Pt is opioid naive  Anxiety: Likely related dyspnea, will add prn ativan   Psycho-social/Spiritual:  Desire for further Chaplaincy support:no   Prognosis:2-4 weeks barring an acute event Discharge Planning: Hospice facility   Care plan was discussed with Dr. Berkley Harvey  Thank you for allowing the Palliative Medicine Team to assist in the care of this patient.   Time In: 1200 Time Out: 1220 Total Time 20 min Prolonged Time Billed  no     Greater than 50%  of this time was spent counseling and coordinating care related to the above assessment and plan.   Dory Horn, NP  10/21/2014, 2:14 PM  Please contact Palliative Medicine Team phone at [phone] for questions and concerns.

## 2014-10-22 LAB — GLUCOSE, CAPILLARY: GLUCOSE-CAPILLARY: 146 mg/dL — AB (ref 65–99)

## 2014-10-22 MED ORDER — IPRATROPIUM-ALBUTEROL 0.5-2.5 (3) MG/3ML IN SOLN
3.0000 mL | Freq: Three times a day (TID) | RESPIRATORY_TRACT | Status: DC
  Administered 2014-10-23 – 2014-10-24 (×3): 3 mL via RESPIRATORY_TRACT
  Filled 2014-10-22 (×4): qty 3

## 2014-10-22 MED ORDER — OXYCODONE HCL 5 MG PO TABS: 5.0000 mg | ORAL_TABLET | ORAL | Status: DC | PRN

## 2014-10-22 MED ORDER — LORAZEPAM 0.5 MG PO TABS
0.5000 mg | ORAL_TABLET | ORAL | Status: DC | PRN
  Administered 2014-10-23: 0.5 mg via ORAL
  Filled 2014-10-22: qty 1

## 2014-10-22 NOTE — Consult Note (Signed)
[facility] Liaison: Received request from [place] 10/22/14 for family interest in [facility]. Chart reviewed. Unfortunately no [employer] availability today or expected tomorrow. Will update CSW if availability changes.   Thank you.  Erling Conte, [place]

## 2014-10-22 NOTE — Social Work (Signed)
CSW seeking Residential Hospice placement. No beds available at [facility] today. Multiple facilities in the area are at capacity. CSW will continue to seek placement.   Christene Lye MSW, [place]

## 2014-10-22 NOTE — Progress Notes (Signed)
Family Medicine Teaching Service Daily Progress Note Intern Pager: [phone]  Patient name: Suzanne [name] Medical record number: [id number] Date of birth: 1921-03-15 Age: 81 y.o. Gender: female  Primary Care Provider: Jenny Reichmann, MD Consultants: Palliative Care, Cardiology, Pulmonology Code Status: DNR  Pt Overview and Major Events to Date:  8/30: Admitted with SOB. CXR with left effusion with left lower lobe collapse; CT notable for abnormal soft tissue at the left pulmonary hilum extending into the mediastinum. 8/31: Continued constipation with hard induration in LLQ. New-onset paroxysmal afib with RVR, put on diltiazem drip with successful conversion to sinus rhythm. Started Coreg. 9/1: Abdominal CT notable only for significant stool burden. BM x2. Rate well-controlled on Coreg. 9/3: Awaiting placement at hospice inpatient   Assessment and Plan:  Suzanne [name] is a 81 y.o. female presenting with two weeks of progressive shortness of breath and imaging concerning for left-sided effusion and an abnormal soft tissue mass at the left pulmonary hilum; patient and family deferring further work up and requesting palliative care. PMH is significant for CHF, CAD, COPD, HTN, DM, aortic aneurysm, and peripheral vascular disease s/p R leg AKA.  Shortness of Breath, Suspicious Lung Mass with Effusion: Patient without improvement in dyspnea overnight. Satting well on 2L [place] continuously. - Palliative Care consulted: Recs = hospice inpatient - Continue home Symbicort - Continue Duoneb q6h and Albuterol q2h PRN - On 2L O2 at present and sating well, home O2 2.5L.   Paroxysmal Atrial Fibrillation with RVR: Patient sufficiently rate-controlled overnight continues to be in NSR. Did have a few episodes of a fib up 110 but resolved spontaneously.  - Appreciate Cardiology recommendations - Continue Coreg 9.375 mg BID  Chest Pain: Resolved - Difficulty explaining palpitations and chest pressure. -  Continue home pepcid and protonix  Constipation: Patient with two BMs yesterday. Abdominal CT notable for stool burden with possible muscle spasm. - Dulcolax enema PRN  Lower Back Pain: Likely secondary to immobilization. No bony lesions noted on previous imaging. - Heating pads, OOB as tolerated  EKG Abnormality, Acute Systolic CHF: Patient and family wish to defer workup of new LBBB and ECHO notable for HFrEF (EF 35-40%), and rather will be pursuing palliative care. - Appreciate cardiology recommendations - Coreg as above - Continue home Lasix 40 mg PO daily - Continue home ASA 325 mg  UTI: Urine culture with >100,000 CFU e. Coli, pan sensitive. She has received 2 days of IV Ceftriaxone and 1 day Keflex. - PO Keflex completed 9/3  Hypokalemia: Resolved.  DM: On Lantus 4U qhs at home. A1c 7.3 this admission. - discontinued Lantus and SSI; continue to monitor CBGs; If BG increase will restart lantus w/o SSI - CBGs qam  HLD: On Crestor 20 mg daily at home. - Discontinued Crestor  CKD Stage III: Creatinine on admission 1.59, which appears to be near her baseline.  - Continue to monitor  FEN/GI: Regular Diet PPx: Heparin per pharmacy   Disposition: Discharge pending inpatient Hospice acceptance. Social Working calling about bed availability   Subjective:  Reports some HA yesterday and "not feeling well" but denies any complaints this morning. Eating better today. Denies CP. Continue to have SOB with ambulation.   Objective: Temp:  [97.4 F (36.3 C)-97.7 F (36.5 C)] 97.5 F (36.4 C) (09/04 0443) Pulse Rate:  [65-70] 68 (09/04 0443) Resp:  [16-18] 18 (09/04 0443) BP: (95-124)/(55-68) 96/57 mmHg (09/04 0443) SpO2:  [92 %-100 %] 98 % (09/04 0846)  Physical Exam: General: Lying  in bed in NAD. Non-toxic Eyes: Conjunctivae non-injected.  Neck: Supple, no LAD Cardiovascular: RRR. 2/6 systolic murmur. 1+ DP pulse on the left. Respiratory: No increased WOB at rest. End  expiratory wheezing throughout. Decreased breath sounds on the L compared to the R. Crackles in the left in the base up to mid lung.  Abdomen:+bowel sounds. Soft NT/ND. MSK: R AKA site well healed. Violaceous changes to the L LE.   Laboratory:  Results for orders placed or performed during the hospital encounter of 10/17/14 (from the past 24 hour(s))  Glucose, capillary     Status: Abnormal   Collection Time: 10/21/14 11:37 AM  Result Value Ref Range   Glucose-Capillary 180 (H) 65 - 99 mg/dL   Comment 1 Notify RN   CBC     Status: Abnormal   Collection Time: 10/21/14  3:02 PM  Result Value Ref Range   WBC 8.6 4.0 - 10.5 K/uL   RBC 3.28 (L) 3.87 - 5.11 MIL/uL   Hemoglobin 9.5 (L) 12.0 - 15.0 g/dL   HCT 30.6 (L) 36.0 - 46.0 %   MCV 93.3 78.0 - 100.0 fL   MCH 29.0 26.0 - 34.0 pg   MCHC 31.0 30.0 - 36.0 g/dL   RDW 12.7 11.5 - 15.5 %   Platelets 314 150 - 400 K/uL  Basic metabolic panel     Status: Abnormal   Collection Time: 10/21/14  3:02 PM  Result Value Ref Range   Sodium 135 135 - 145 mmol/L   Potassium 3.9 3.5 - 5.1 mmol/L   Chloride 96 (L) 101 - 111 mmol/L   CO2 27 22 - 32 mmol/L   Glucose, Bld 168 (H) 65 - 99 mg/dL   BUN 40 (H) 6 - 20 mg/dL   Creatinine, Ser 1.61 (H) 0.44 - 1.00 mg/dL   Calcium 8.7 (L) 8.9 - 10.3 mg/dL   GFR calc non Af Amer 27 (L) >60 mL/min   GFR calc Af Amer 31 (L) >60 mL/min   Anion gap 12 5 - 15  Glucose, capillary     Status: Abnormal   Collection Time: 10/21/14  4:26 PM  Result Value Ref Range   Glucose-Capillary 155 (H) 65 - 99 mg/dL  Glucose, capillary     Status: Abnormal   Collection Time: 10/22/14  6:16 AM  Result Value Ref Range   Glucose-Capillary 146 (H) 65 - 99 mg/dL   Comment 1 Notify RN    Imaging:   Olam Idler, MD PGY-3,  [facility] 10/22/2014  9:17 AM

## 2014-10-22 NOTE — Progress Notes (Signed)
Patient Name: LAKECIA DESCHAMPS Date of Encounter: 10/22/2014  Active Problems:   CAD (coronary artery disease)   Pleural effusion   SOB (shortness of breath)   Pain in the chest   Type 2 diabetes mellitus with complication   Constipation   UTI (lower urinary tract infection)   PAF (paroxysmal atrial fibrillation)   Chronic systolic CHF (congestive heart failure)   CKD (chronic kidney disease), stage IV   Pericardial effusion   Lung mass   Palliative care encounter   Primary Cardiologist: Dr Gwenlyn Found  Patient Profile: 81 yo female w/ CAD, D-CHF, COPD (on O2), CKD 3, HTN, HL, GERD, DM, R AKA. Admitted 08/30 for SOB. Cards seeing for atrial fib, pt has pericardial effusion, EF now 35%. Discharge to in-pt Hospice planned.  SUBJECTIVE: Breathing OK today, gets SOB getting up to [facility]. No chest pain or palpitations.  OBJECTIVE Filed Vitals:   10/21/14 2000 10/22/14 0116 10/22/14 0443 10/22/14 0846  BP:   96/57   Pulse:   68   Temp:   97.5 F (36.4 C)   TempSrc:   Oral   Resp:   18   Height:      Weight:      SpO2: 98% 99% 97% 98%   No intake or output data in the 24 hours ending 10/22/14 0855 Filed Weights   10/17/14 1724  Weight: 119 lb 11.2 oz (54.296 kg)    PHYSICAL EXAM General: Well developed, well nourished, female in no acute distress. Head: Normocephalic, atraumatic.  Neck: Supple without bruits, JVD not elevated. Lungs:  Resp regular and unlabored, decreased BS bases. Heart: RRR, S1, S2, no S3, S4, or murmur; no rub. Abdomen: Soft, non-tender, non-distended, BS + x 4.  Extremities: No clubbing, cyanosis, edema. S/p R AKA Neuro: Alert and oriented X 3. Moves all extremities spontaneously. Psych: Normal affect.  LABS: CBC: Recent Labs  10/21/14 1502  WBC 8.6  HGB 9.5*  HCT 30.6*  MCV 93.3  PLT 659   Basic Metabolic Panel: Recent Labs  10/21/14 1502  NA 135  K 3.9  CL 96*  CO2 27  GLUCOSE 168*  BUN 40*  CREATININE 1.61*  CALCIUM 8.7*    Cardiac Enzymes: Recent Labs  10/20/14 1130 10/20/14 1703 10/20/14 2226  TROPONINI 0.03 0.03 0.03   BNP:  B NATRIURETIC PEPTIDE  Date/Time Value Ref Range Status  10/17/2014 02:02 PM 298.3* 0.0 - 100.0 pg/mL Final   TELE:   SR w/ ?long QT     Current Medications:  . aspirin EC  325 mg Oral Daily  . baclofen  5 mg Oral Once  . bisacodyl  10 mg Rectal Once  . budesonide-formoterol  2 puff Inhalation BID  . carvedilol  9.375 mg Oral BID WC  . famotidine  10 mg Oral Daily  . ferrous sulfate  325 mg Oral Q breakfast  . furosemide  40 mg Oral Daily  . heparin subcutaneous  5,000 Units Subcutaneous 3 times per day  . ipratropium-albuterol  3 mL Nebulization Q6H  . pantoprazole  40 mg Oral Daily  . polyethylene glycol  17 g Oral Daily      ASSESSMENT AND PLAN: 1 PAF:  Currently in SR Continue meds Would not anticoagulation Keep on ASA  2. Chronic systolic and diastolic CHF: Volume status looks good On Coreg and Lasix Not on ACE /ARBdue to poor renal function. Follow BP, SBP 90s-120s, think OK w/ pt small stature. No dose changes, no  meds held so far.   3 L hilar mass with pulm effusion:  No plan for Rx. Palliative care seeing  4. Mod pericardial effusion. May be related to 3. No signs of hemodynamic compromise Follow  Otherwise, per IM/Palliative Care. For d/c to in-patient Hospice. We will see PRN. Active Problems:   CAD (coronary artery disease)   Pleural effusion   SOB (shortness of breath)   Pain in the chest   Type 2 diabetes mellitus with complication   Constipation   UTI (lower urinary tract infection)   PAF (paroxysmal atrial fibrillation)   Chronic systolic CHF (congestive heart failure)   CKD (chronic kidney disease), stage IV   Pericardial effusion   Lung mass   Palliative care encounter   Signed, Rosaria Ferries , PA-C 8:55 AM 10/22/2014 As above, patient seen and examined. She denies chest pain or dyspnea. She remains in sinus  rhythm. Continue low-dose beta blocker. She does not want aggressive evaluation of lung mass or cardiac status. Palliative care consult noted with plans for inpatient hospice. Cardiology will sign off. Please call with questions. Kirk Ruths

## 2014-10-23 LAB — GLUCOSE, CAPILLARY: GLUCOSE-CAPILLARY: 141 mg/dL — AB (ref 65–99)

## 2014-10-23 NOTE — Progress Notes (Signed)
Family Medicine Teaching Service Daily Progress Note Intern Pager: [phone]  Patient name: Suzanne [name] Medical record number: [id number] Date of birth: 11-19-1921 Age: 81 y.o. Gender: female  Primary Care Provider: Jenny Reichmann, MD Consultants: Palliative Care, Cardiology, Pulmonology Code Status: DNR  Pt Overview and Major Events to Date:  8/30: Admitted with SOB. CXR with left effusion with left lower lobe collapse; CT notable for abnormal soft tissue at the left pulmonary hilum extending into the mediastinum. 8/31: Continued constipation with hard induration in LLQ. New-onset paroxysmal afib with RVR, put on diltiazem drip with successful conversion to sinus rhythm. Started Coreg. 9/1: Abdominal CT notable only for significant stool burden. BM x2. Rate well-controlled on Coreg. 9/3: Awaiting placement at hospice inpatient   Assessment and Plan:  Suzanne [name] is a 81 y.o. female presenting with two weeks of progressive shortness of breath and imaging concerning for left-sided effusion and an abnormal soft tissue mass at the left pulmonary hilum; patient and family deferring further work up and requesting palliative care. PMH is significant for CHF, CAD, COPD, HTN, DM, aortic aneurysm, and peripheral vascular disease s/p R leg AKA.  Shortness of Breath, Suspicious Lung Mass with Effusion: No dyspnea overnight, but notes a rattling/vibrating noise with each breath. Sating between 96-100% on 2L [place]. - Palliative Care consulted, recommend hospice inpatient - Continue home Symbicort - Continue Duoneb q6h and Albuterol q2h PRN - On 2L O2 at present and sating well, home O2 2.5L. Will change to humidified O2, give her nose dryness/pain. - If she develops SOB or increased WOB, will consider adding morphine to help decrease respiratory drive.  Paroxysmal Atrial Fibrillation with RVR: Patient sufficiently rate-controlled overnight- HRs ranging from 67-73, in NSR. Did not have any episodes of  A-fib overnight.  - Appreciate cardiology recommendations - Continue Coreg 9.375 mg BID  Chest Pain: Resolved - Difficulty explaining palpitations and chest pressure. - Continue home pepcid and protonix  Constipation: Has not had BM since 9/2. States abdomen is uncomfortable. Abdominal CT notable for stool burden with possible muscle spasm. - Miralax and Dulcolax suppository - If no BM, will give another enema to help with comfort  Lower Back Pain: Likely secondary to immobilization. No bony lesions noted on previous imaging. - Heating pads, OOB as tolerated  EKG Abnormality, Acute Systolic CHF: Patient and family wish to defer workup of new LBBB and ECHO notable for HFrEF (EF 35-40%), and rather will be pursuing palliative care. - Appreciate cardiology recommendations - Coreg as above - Continue home Lasix 40 mg PO daily - Will d/c home ASA, as this is not a necessary comfort medication  UTI, resolved: Urine culture with >100,000 CFU E. coli, pan sensitive. She has been sufficiently treated with IV Ceftriaxone and PO Keflex, which was completed on 9/3  Hypokalemia: Resolved.  DM: On Lantus 4U qhs at home. A1c 7.3 this admission. CBGs this am 141. - discontinued Lantus and SSI; continue to monitor CBGs; If BG increase will restart lantus w/o SSI - Will continue getting qam CBGs  HLD: On Crestor 20 mg daily at home. - Discontinued Crestor  CKD Stage III: Creatinine on admission 1.59, which appears to be near her baseline.  - Continue to monitor  FEN/GI: Regular Diet PPx: Heparin has been d/c'ed  Disposition: Discharge pending inpatient Hospice acceptance. SW calling about bed availability.  Subjective:  States her nose was hurting overnight from the O2. She has not had any chest pain or difficulty breathing overnight, but  she does state that she heard a rattling/vibrating noise in her chest while breathing last night. She has not had a BM since 9/2 and states that her belly is  uncomfortable and feels like she's "blocked up". She would like something to help her have a BM.  Objective: Temp:  [97.6 F (36.4 C)-97.9 F (36.6 C)] 97.9 F (36.6 C) (09/05 0506) Pulse Rate:  [67-73] 67 (09/05 0506) Resp:  [16-18] 16 (09/05 0506) BP: (106-145)/(54-68) 145/68 mmHg (09/05 0506) SpO2:  [96 %-100 %] 100 % (09/05 0506)  Physical Exam: General: Elderly woman lying in bed, pleasant and conversational, in NAD HEENT: [place]/AT, EOMI, MMM Cardiovascular: RRR. 2/6 systolic murmur loudest in upper L sternal border Respiratory: Normal WOB. No wheezes auscultated today. Decreased breath sounds on the L compared to the R. Crackles in the left in the base up to mid lung.  Abdomen: +BS, soft, non-distended, TTP in RLQ and LLQ. MSK: R AKA site well healed.   Laboratory:  Results for orders placed or performed during the hospital encounter of 10/17/14 (from the past 24 hour(s))  Glucose, capillary     Status: Abnormal   Collection Time: 10/23/14  6:14 AM  Result Value Ref Range   Glucose-Capillary 141 (H) 65 - 99 mg/dL   Imaging:   Sela Hua, MD PGY-1,  [facility] 10/23/2014  6:58 AM

## 2014-10-23 NOTE — Discharge Instructions (Signed)
You came in with shortness of breath. We did a CT of your lungs, which showed a suspicious mass in your left lung. After careful consideration, you and your family decided that you did not want to undergo a biopsy of that mass, because you did not want to have surgery if it were found to be cancerous. You and your family decided to pursue comfort care at this time. I have attached some additional information about hospice care below.   Hospice Hospice is a service that is designed to provide people who are terminally ill and their families with medical, spiritual, and psychological support. Its aim is to improve your quality of life by keeping you as alert and comfortable as possible. Hospice is performed by a team of health care professionals and volunteers who:  Help keep you comfortable. Hospice can be provided in your home or in a homelike setting. The hospice staff works with your family and friends to help meet your needs. You will enjoy the support of loved ones by receiving much of your basic care from family and friends.  Provide pain relief and manage your symptoms. The staff supply all necessary medicines and equipment.  Provide companionship when you are alone.  Allow you and your family to rest. They may do light housekeeping, prepare meals, and run errands.  Provide counseling. They will make sure your emotional, spiritual, and social needs and those of your family are being met.  Provide spiritual care. Spiritual care is individualized to meet your needs and your family's needs. It may involve helping you look at what death means to you, say goodbye, or perform a specific religious ceremony or ritual. Hospice teams often include:  A nurse.  A doctor.  Social workers.  Religious leaders (such as a Clinical biochemist).  Trained volunteers. WHEN SHOULD HOSPICE CARE BEGIN? Most people who use hospice are believed to have fewer than 6 months to live. Your family and health care providers  can help you decide when hospice services should begin. If your condition improves, you may discontinue the program. WHAT [place]? Most hospice programs are run by nonprofit, independent organizations. Some are affiliated with hospitals, nursing homes, or home health care agencies. Hospice programs can take place in the home or at a hospice center, hospital, or skilled nursing facility. When choosing a hospice program, ask the following questions:  What services are available to me?  What services are offered to my loved ones?  How involved are my loved ones?  How involved is my health care provider?  Who makes up the hospice care team? How are they trained or screened?  How will my pain and symptoms be managed?  If my circumstances change, can the services be provided in a different setting, such as my home or in the hospital?  Is the program reviewed and licensed by the state or certified in some other way? WHERE CAN I LEARN MORE ABOUT HOSPICE? You can learn about existing hospice programs in your area from your health care providers. You can also read more about hospice online. The websites of the following organizations contain helpful information:  The [facility] and Palliative Care Organization [facility]).  The Hospice Association of America ([place]).  The [place].  The American Cancer Society (ACS).  Hospice Net. Document Released: 05/23/2003 Document Revised: 02/08/2013 Document Reviewed: 12/14/2012 [facility] Patient Information 2015 [place], [place]. This information is not intended to replace advice given to you by your  health care provider. Make sure you discuss any questions you have with your health care provider.

## 2014-10-23 NOTE — Care Management Important Message (Signed)
Important Message  Patient Details  Name: Suzanne [name] MRN: [id number] Date of Birth: 22-May-1921   Medicare Important Message Given:  Yes-third notification given    Loann Quill 10/23/2014, 8:51 AM

## 2014-10-23 NOTE — Progress Notes (Addendum)
4pm [facility] is able to accept the patient tomorrow (9/6)- CSW informed MD- they are agreeable to transfer to hospice tomorrow  11am CSW informed that [facility] cannot admit patient today (9/5) CSW spoke with pt daughter and pt at bedside concerning alternate options- Pt daughter is agreeable [facility] referral but seems to be very upset and confused at this time- referral made to [facility]  Pt daughter inquired about residential hospice placement and how long she could stay there- CSW explained that usually patient would not be there more than a few weeks- pt daughter inquiring where pt would go after that- is not aware of pt prognosis if pt is truly residential hospice appropriate.  CSW left message for palliative team to discuss.  CSW will continue to follow.  Domenica Reamer, [place] Social Worker [phone]

## 2014-10-24 DIAGNOSIS — J948 Other specified pleural conditions: Secondary | ICD-10-CM

## 2014-10-24 DIAGNOSIS — Z515 Encounter for palliative care: Secondary | ICD-10-CM

## 2014-10-24 DIAGNOSIS — J90 Pleural effusion, not elsewhere classified: Secondary | ICD-10-CM | POA: Insufficient documentation

## 2014-10-24 LAB — GLUCOSE, CAPILLARY: Glucose-Capillary: 120 mg/dL — ABNORMAL HIGH (ref 65–99)

## 2014-10-24 MED ORDER — BISACODYL 10 MG RE SUPP: 10.0000 mg | Freq: Every day | RECTAL | Status: AC | PRN

## 2014-10-24 MED ORDER — LORAZEPAM 0.5 MG PO TABS: 0.5000 mg | ORAL_TABLET | ORAL | Status: AC | PRN

## 2014-10-24 MED ORDER — OXYCODONE HCL 5 MG PO TABS: 5.0000 mg | ORAL_TABLET | ORAL | Status: AC | PRN

## 2014-10-24 MED ORDER — CARVEDILOL 3.125 MG PO TABS: 9.3750 mg | ORAL_TABLET | Freq: Two times a day (BID) | ORAL | Status: AC

## 2014-10-24 MED ORDER — POLYETHYLENE GLYCOL 3350 17 G PO PACK: 17.0000 g | PACK | Freq: Every day | ORAL | Status: DC

## 2014-10-24 NOTE — Progress Notes (Signed)
Patient will discharge to [facility] Anticipated discharge date:10/24/14 Family notified: dtr at bedside Transportation by [facility]- called at 9:50am  [place] signing off.  Domenica Reamer, [place] Social Worker [phone]

## 2014-10-24 NOTE — Progress Notes (Signed)
Family Medicine Teaching Service Daily Progress Note Intern Pager: [phone]  Patient name: Suzanne [name] Medical record number: [id number] Date of birth: 04/13/1921 Age: 81 y.o. Gender: female  Primary Care Provider: Jenny Reichmann, MD Consultants: Palliative Care, Cardiology, Pulmonology Code Status: DNR  Pt Overview and Major Events to Date:  8/30: Admitted with SOB. CXR with left effusion with left lower lobe collapse; CT notable for abnormal soft tissue at the left pulmonary hilum extending into the mediastinum. 8/31: Continued constipation with hard induration in LLQ. New-onset paroxysmal afib with RVR, put on diltiazem drip with successful conversion to sinus rhythm. Started Coreg. 9/1: Abdominal CT notable only for significant stool burden. BM x2. Rate well-controlled on Coreg. 9/5: Accepted a bed at [facility]- d/c on 9/6  Assessment and Plan: ADEA GEISEL is a 81 y.o. female presenting with two weeks of progressive shortness of breath and imaging concerning for left-sided effusion and an abnormal soft tissue mass at the left pulmonary hilum; patient and family deferring further work up and requesting palliative care. PMH is significant for CHF, CAD, COPD, HTN, DM, aortic aneurysm, and peripheral vascular disease s/p R leg AKA.  Shortness of Breath, Suspicious Lung Mass with Effusion: No dyspnea overnight. Sating between 97-100% on 2L [place]. - Comfort care - Continue home Symbicort - Continue Duoneb q6h and Albuterol q2h PRN - On 2L O2 at present and sating well, home O2 2.5L. Will change to humidified O2, give her nose dryness/pain. - Oxycodone PRN and Ativan PRN for dyspnea per Palliative Care, appreciate recommendations - d/c today to [facility]  Paroxysmal Atrial Fibrillation with RVR: Patient sufficiently rate-controlled overnight- HRs ranging from 70-75, in NSR. Did not have any episodes of A-fib overnight.  - Appreciate cardiology recommendations -  Continue Coreg 9.375 mg BID   Chest Pain: Resolved - Difficulty explaining palpitations and chest pressure. - Continue home pepcid and protonix  Constipation: BM x 1 yesterday. Feels like she's more bloated today. Abdominal CT notable for stool burden with possible muscle spasm. - Miralax qd and Dulcolax suppository PRN  Lower Back Pain: Likely secondary to immobilization. No bony lesions noted on previous imaging. - Heating pads, OOB as tolerated  EKG Abnormality, Acute Systolic CHF: Patient and family wish to defer workup of new LBBB and ECHO notable for HFrEF (EF 35-40%), and rather will be pursuing palliative care. - Appreciate cardiology recommendations - Coreg as above - Continue home Lasix 40 mg PO daily   UTI, resolved: Urine culture with >100,000 CFU E. coli, pan sensitive. She has been sufficiently treated with IV Ceftriaxone and PO Keflex, which was completed on 9/3.  Hypokalemia: Resolved.  DM: On Lantus 4U qhs at home. A1c 7.3 this admission. CBGs this am 141. - We have stopped Lantus and SSI - Will stop getting qam CBGs   HLD: On Crestor 20 mg daily at home. - Crestor has been discontinued  CKD Stage III: Creatinine on admission 1.59, which appears to be near her baseline.  - Continue to monitor  FEN/GI: Regular Diet PPx: Heparin has been d/c'ed  Disposition: To [facility] today  Subjective:  Pt slept well overnight. No dyspnea. Had a BM yesterday. Has not had any abdominal pain. Feels like she is a little bit more bloated than normal. No other concerns.  Objective: Temp:  [97.6 F (36.4 C)-98.3 F (36.8 C)] 98 F (36.7 C) (09/06 0617) Pulse Rate:  [70-75] 75 (09/06 0617) Resp:  [16] 16 (09/06 0617)  BP: (105-125)/(50-95) 125/95 mmHg (09/06 0617) SpO2:  [97 %-100 %] 100 % (09/06 0617)  Physical Exam: General: Elderly woman lying in bed, pleasant and conversational, in NAD HEENT: [place]/AT, EOMI, MMM Cardiovascular: RRR. 2/6 systolic murmur loudest  in upper L sternal border Respiratory: Normal WOB. No wheezes or crackles auscultated today. Decreased breath sounds on the L compared to the R.  Abdomen: +BS, mildly distended but soft, generalized tenderness to palpation. MSK: R AKA site well healed.   Laboratory:  Results for orders placed or performed during the hospital encounter of 10/17/14 (from the past 24 hour(s))  Glucose, capillary     Status: Abnormal   Collection Time: 10/24/14  6:15 AM  Result Value Ref Range   Glucose-Capillary 120 (H) 65 - 99 mg/dL   Imaging:   Sela Hua, MD PGY-1,  [place] 10/24/2014  6:45 AM

## 2014-10-24 NOTE — Care Management Note (Signed)
Case Management Note Marvetta Gibbons RN, BSN Unit 2W-Case Manager [phone]  Patient Details  Name: Suzanne [name] MRN: [id number] Date of Birth: [birth year]  Subjective/Objective:    Pt admitted with Pleural effusion/SOB                Action/Plan: PTA pt lived at home alone, per daughterBarnett Applebaum, mostly in w/c- PC mtg has been order- plan to meet later this afternoon-- NCM to f/u after PC mtg to see what decisions are made regarding d/c plans. Daughter not sure at this time if her mother can return home.   Expected Discharge Date:       10/24/14           Expected Discharge Plan:  [place]  In-House Referral:  Hospice / Palliative Care, Clinical Social Work  Discharge planning Services  CM Consult  Post Acute Care Choice:    Choice offered to:     DME Arranged:    DME Agency:     HH Arranged:    [place] Agency:     Status of Service:  Completed, signed off  Medicare Important Message Given:  Yes-third notification given Date Medicare IM Given:    Medicare IM give by:    Date Additional Medicare IM Given:    Additional Medicare Important Message give by:     If discussed at [place] of Stay Meetings, dates discussed:    Additional Comments:  10/24/14- pt to d/c to hospice home today- no further CM needs. CSW following  Dawayne Patricia, RN 10/24/2014, 2:17 PM

## 2014-10-24 NOTE — Progress Notes (Signed)
Patient discharged to [facility], via EMS transport.  Patients daughter is at bedside.  Patient appears to be in no immediate distress at this time.  Report called to [facility].  Brita Romp, RN

## 2014-11-09 ENCOUNTER — Telehealth: Payer: Self-pay

## 2014-11-09 NOTE — Telephone Encounter (Signed)
Spoke to [employer] and was able to cancel all medications.

## 2014-11-09 NOTE — Telephone Encounter (Signed)
pts daughter Lauralei Clouse is returning  a call they received on Tuesday 11/07/14 from [place]. Per daughter Dr Everlene Farrier was wanting to follow up on her mother, daughter also advises her mother has been moved to hospice care Daughter can be reached at [phone]

## 2014-11-09 NOTE — Telephone Encounter (Signed)
Pt's mother in hospice facility. Daughter wanted to know if we can call [facility] and have all prescriptions cancelled. I can try to call them and get that done for her. Her hospice facility has taken her off all medications. Hospice gave her a prognosis of 2-4 weeks; pt has been in hospice facility for two weeks.

## 2014-11-21 ENCOUNTER — Encounter: Payer: Self-pay | Admitting: Emergency Medicine

## 2014-12-05 ENCOUNTER — Telehealth: Payer: Self-pay

## 2014-12-05 NOTE — Telephone Encounter (Signed)
I will agreed to be the attending area I would like to hospice providers to be in charge of her medications. [place] the nex 3weeks. I did see Suzanne [name] recently and she knows this.

## 2014-12-05 NOTE — Telephone Encounter (Signed)
Quenton Fetter, RN w/[facility], called to req that Dr Everlene Farrier be the attending doctor for pt as she is being released from IP back into the home. Dr Everlene Farrier, will you agreed to be attending (OK orders, etc) and do you want the Hospice providers to manangement symptom management Rxs and treatments?

## 2014-12-05 NOTE — Telephone Encounter (Signed)
Called Joann/Hospice back and LMOM giving her info from Dr Everlene Farrier below.

## 2014-12-05 NOTE — Telephone Encounter (Signed)
Hospice called today to let us know they were faxing [place] some records on pt, so I will look out for these.

## 2014-12-07 ENCOUNTER — Emergency Department (HOSPITAL_COMMUNITY)

## 2014-12-07 ENCOUNTER — Emergency Department (HOSPITAL_COMMUNITY)
Admission: EM | Admit: 2014-12-07 | Discharge: 2014-12-07 | Disposition: A | Discharge status: Home / Self Care | Attending: Emergency Medicine | Admitting: Emergency Medicine

## 2014-12-07 ENCOUNTER — Encounter (HOSPITAL_COMMUNITY): Payer: Self-pay | Admitting: Emergency Medicine

## 2014-12-07 DIAGNOSIS — Z7901 Long term (current) use of anticoagulants: Secondary | ICD-10-CM | POA: Diagnosis not present

## 2014-12-07 DIAGNOSIS — I5032 Chronic diastolic (congestive) heart failure: Secondary | ICD-10-CM | POA: Insufficient documentation

## 2014-12-07 DIAGNOSIS — Z87440 Personal history of urinary (tract) infections: Secondary | ICD-10-CM | POA: Insufficient documentation

## 2014-12-07 DIAGNOSIS — Z9981 Dependence on supplemental oxygen: Secondary | ICD-10-CM | POA: Diagnosis not present

## 2014-12-07 DIAGNOSIS — Z8669 Personal history of other diseases of the nervous system and sense organs: Secondary | ICD-10-CM | POA: Diagnosis not present

## 2014-12-07 DIAGNOSIS — K922 Gastrointestinal hemorrhage, unspecified: Secondary | ICD-10-CM

## 2014-12-07 DIAGNOSIS — Z79899 Other long term (current) drug therapy: Secondary | ICD-10-CM | POA: Insufficient documentation

## 2014-12-07 DIAGNOSIS — E119 Type 2 diabetes mellitus without complications: Secondary | ICD-10-CM | POA: Insufficient documentation

## 2014-12-07 DIAGNOSIS — Z794 Long term (current) use of insulin: Secondary | ICD-10-CM | POA: Insufficient documentation

## 2014-12-07 DIAGNOSIS — Z89611 Acquired absence of right leg above knee: Secondary | ICD-10-CM | POA: Diagnosis not present

## 2014-12-07 DIAGNOSIS — K5900 Constipation, unspecified: Secondary | ICD-10-CM | POA: Diagnosis not present

## 2014-12-07 DIAGNOSIS — Z8659 Personal history of other mental and behavioral disorders: Secondary | ICD-10-CM | POA: Diagnosis not present

## 2014-12-07 DIAGNOSIS — Z8701 Personal history of pneumonia (recurrent): Secondary | ICD-10-CM | POA: Insufficient documentation

## 2014-12-07 DIAGNOSIS — Z87891 Personal history of nicotine dependence: Secondary | ICD-10-CM | POA: Diagnosis not present

## 2014-12-07 DIAGNOSIS — I2510 Atherosclerotic heart disease of native coronary artery without angina pectoris: Secondary | ICD-10-CM | POA: Diagnosis not present

## 2014-12-07 DIAGNOSIS — K2970 Gastritis, unspecified, without bleeding: Secondary | ICD-10-CM | POA: Diagnosis not present

## 2014-12-07 DIAGNOSIS — R112 Nausea with vomiting, unspecified: Secondary | ICD-10-CM

## 2014-12-07 DIAGNOSIS — Z8781 Personal history of (healed) traumatic fracture: Secondary | ICD-10-CM | POA: Insufficient documentation

## 2014-12-07 DIAGNOSIS — I10 Essential (primary) hypertension: Secondary | ICD-10-CM | POA: Diagnosis not present

## 2014-12-07 DIAGNOSIS — Z862 Personal history of diseases of the blood and blood-forming organs and certain disorders involving the immune mechanism: Secondary | ICD-10-CM | POA: Insufficient documentation

## 2014-12-07 DIAGNOSIS — J441 Chronic obstructive pulmonary disease with (acute) exacerbation: Secondary | ICD-10-CM | POA: Diagnosis not present

## 2014-12-07 DIAGNOSIS — K219 Gastro-esophageal reflux disease without esophagitis: Secondary | ICD-10-CM | POA: Insufficient documentation

## 2014-12-07 LAB — COMPREHENSIVE METABOLIC PANEL
ALT: 22 U/L (ref 14–54)
AST: 26 U/L (ref 15–41)
Albumin: 2.8 g/dL — ABNORMAL LOW (ref 3.5–5.0)
Alkaline Phosphatase: 73 U/L (ref 38–126)
Anion gap: 13 (ref 5–15)
BILIRUBIN TOTAL: 0.3 mg/dL (ref 0.3–1.2)
BUN: 39 mg/dL — AB (ref 6–20)
CO2: 29 mmol/L (ref 22–32)
CREATININE: 1.72 mg/dL — AB (ref 0.44–1.00)
Calcium: 9.3 mg/dL (ref 8.9–10.3)
Chloride: 98 mmol/L — ABNORMAL LOW (ref 101–111)
GFR calc Af Amer: 29 mL/min — ABNORMAL LOW (ref >60)
GFR calc non Af Amer: 25 mL/min — ABNORMAL LOW (ref >60)
Glucose, Bld: 257 mg/dL — ABNORMAL HIGH (ref 65–99)
Potassium: 4.1 mmol/L (ref 3.5–5.1)
Sodium: 140 mmol/L (ref 135–145)
Total Protein: 5.8 g/dL — ABNORMAL LOW (ref 6.5–8.1)

## 2014-12-07 LAB — TYPE AND SCREEN
ABO/RH(D): B POS
Antibody Screen: NEGATIVE

## 2014-12-07 LAB — CBC
HEMATOCRIT: 35.8 % — AB (ref 36.0–46.0)
HEMOGLOBIN: 11 g/dL — AB (ref 12.0–15.0)
MCH: 28.7 pg (ref 26.0–34.0)
MCHC: 30.7 g/dL (ref 30.0–36.0)
MCV: 93.5 fL (ref 78.0–100.0)
Platelets: 210 10*3/uL (ref 150–400)
RBC: 3.83 MIL/uL — AB (ref 3.87–5.11)
RDW: 14.3 % (ref 11.5–15.5)
WBC: 11.6 10*3/uL — ABNORMAL HIGH (ref 4.0–10.5)

## 2014-12-07 LAB — POC OCCULT BLOOD, ED: FECAL OCCULT BLD: POSITIVE — AB

## 2014-12-07 MED ORDER — SIMETHICONE 80 MG PO CHEW: 80.0000 mg | CHEWABLE_TABLET | Freq: Four times a day (QID) | ORAL | Status: AC | PRN

## 2014-12-07 MED ORDER — DICYCLOMINE HCL 10 MG PO CAPS
10.0000 mg | ORAL_CAPSULE | Freq: Once | ORAL | Status: AC
  Administered 2014-12-07: 10 mg via ORAL
  Filled 2014-12-07: qty 1

## 2014-12-07 MED ORDER — RANITIDINE HCL 150 MG PO TABS: 150.0000 mg | ORAL_TABLET | Freq: Two times a day (BID) | ORAL | Status: AC

## 2014-12-07 MED ORDER — SODIUM CHLORIDE 0.9 % IV BOLUS (SEPSIS)
500.0000 mL | Freq: Once | INTRAVENOUS | Status: AC
Start: 2014-12-07 — End: 2014-12-07
  Administered 2014-12-07: 500 mL via INTRAVENOUS

## 2014-12-07 MED ORDER — ONDANSETRON 4 MG PO TBDP
ORAL_TABLET | ORAL | Status: AC
Start: 2014-12-07 — End: ?

## 2014-12-07 MED ORDER — ONDANSETRON HCL 4 MG/2ML IJ SOLN
4.0000 mg | Freq: Once | INTRAMUSCULAR | Status: AC
  Administered 2014-12-07: 4 mg via INTRAVENOUS
  Filled 2014-12-07: qty 2

## 2014-12-07 MED ORDER — DICYCLOMINE HCL 20 MG PO TABS: 20.0000 mg | ORAL_TABLET | Freq: Two times a day (BID) | ORAL | Status: AC | PRN

## 2014-12-07 MED ORDER — SIMETHICONE 40 MG/0.6ML PO SUSP (UNIT DOSE)
40.0000 mg | Freq: Once | ORAL | Status: AC
  Administered 2014-12-07: 40 mg via ORAL
  Filled 2014-12-07: qty 0.6

## 2014-12-07 NOTE — Discharge Instructions (Signed)
Nausea and Vomiting Nausea is a sick feeling that often comes before throwing up (vomiting). Vomiting is a reflex where stomach contents come out of your mouth. Vomiting can cause severe loss of body fluids (dehydration). Children and elderly adults can become dehydrated quickly, especially if they also have diarrhea. Nausea and vomiting are symptoms of a condition or disease. It is important to find the cause of your symptoms. CAUSES   Direct irritation of the stomach lining. This irritation can result from increased acid production (gastroesophageal reflux disease), infection, food poisoning, taking certain medicines (such as nonsteroidal anti-inflammatory drugs), alcohol use, or tobacco use.  Signals from the brain.These signals could be caused by a headache, heat exposure, an inner ear disturbance, increased pressure in the brain from injury, infection, a tumor, or a concussion, pain, emotional stimulus, or metabolic problems.  An obstruction in the gastrointestinal tract (bowel obstruction).  Illnesses such as diabetes, hepatitis, gallbladder problems, appendicitis, kidney problems, cancer, sepsis, atypical symptoms of a heart attack, or eating disorders.  Medical treatments such as chemotherapy and radiation.  Receiving medicine that makes you sleep (general anesthetic) during surgery. DIAGNOSIS Your caregiver may ask for tests to be done if the problems do not improve after a few days. Tests may also be done if symptoms are severe or if the reason for the nausea and vomiting is not clear. Tests may include:  Urine tests.  Blood tests.  Stool tests.  Cultures (to look for evidence of infection).  X-rays or other imaging studies. Test results can help your caregiver make decisions about treatment or the need for additional tests. TREATMENT You need to stay well hydrated. Drink frequently but in small amounts.You may wish to drink water, sports drinks, clear broth, or eat frozen  ice pops or gelatin dessert to help stay hydrated.When you eat, eating slowly may help prevent nausea.There are also some antinausea medicines that may help prevent nausea. HOME CARE INSTRUCTIONS   Take all medicine as directed by your caregiver.  If you do not have an appetite, do not force yourself to eat. However, you must continue to drink fluids.  If you have an appetite, eat a normal diet unless your caregiver tells you differently.  Eat a variety of complex carbohydrates (rice, wheat, potatoes, bread), lean meats, yogurt, fruits, and vegetables.  Avoid high-fat foods because they are more difficult to digest.  Drink enough water and fluids to keep your urine clear or pale yellow.  If you are dehydrated, ask your caregiver for specific rehydration instructions. Signs of dehydration may include:  Severe thirst.  Dry lips and mouth.  Dizziness.  Dark urine.  Decreasing urine frequency and amount.  Confusion.  Rapid breathing or pulse. SEEK IMMEDIATE MEDICAL CARE IF:   You have blood or brown flecks (like coffee grounds) in your vomit.  You have black or bloody stools.  You have a severe headache or stiff neck.  You are confused.  You have severe abdominal pain.  You have chest pain or trouble breathing.  You do not urinate at least once every 8 hours.  You develop cold or clammy skin.  You continue to vomit for longer than 24 to 48 hours.  You have a fever. MAKE SURE YOU:   Understand these instructions.  Will watch your condition.  Will get help right away if you are not doing well or get worse.   This information is not intended to replace advice given to you by your health care provider. Make sure  you discuss any questions you have with your health care provider.   Document Released: 02/03/2005 Document Revised: 04/28/2011 Document Reviewed: 07/03/2010 Elsevier Interactive Patient Education 2016 Elsevier Inc.  Hematemesis Hematemesis is when  you vomit blood. It is a sign of bleeding in the upper part of your digestive tract. This is also called your gastrointestinal (GI) tract. Your upper GI tract includes your mouth, throat, esophagus, stomach, and the first part of your small intestine (duodenum).  Hematemesis is usually caused by bleeding from your esophagus or stomach. You may suddenly vomit bright red blood. You might also vomit old blood. It may look like coffee grounds. You may also have other symptoms, such as:  Stomach pain.  Heartburn.  Black and tarry stool.  HOME CARE INSTRUCTIONS  Watch your hematemesis for any changes. The following actions may help to lessen any discomfort you are feeling:  Take medicines only as directed by your health care provider. Do not take aspirin, ibuprofen, or any other anti-inflammatory medicine without approval from your health care provider.  Rest as needed.  Drink small sips of clear liquids often, as long as you can keep them down. Try to drink enough fluids to keep your urine clear or pale yellow.  Do not drink alcohol.  Do not use any tobacco products, including cigarettes, chewing tobacco, or electronic cigarettes. If you need help quitting, ask your health care provider.  Keep all follow-up visits as directed by your health care provider. This is important. SEEK MEDICAL CARE IF:   The vomiting of blood worsens, or begins again after it has stopped.  You have persistent stomach pain.  You have nausea, indigestion, or heartburn.  You feel weak or dizzy. SEEK IMMEDIATE MEDICAL CARE IF:   You faint or feel extremely weak.  You have a rapid heartbeat.  You are urinating less than normal or not at all.  You have persistent vomiting.  You vomit large amounts of bloody or dark material.  You vomit bright red blood.  You pass large, dark, or bloody stools.  You have chest pain or trouble breathing.   This information is not intended to replace advice given to  you by your health care provider. Make sure you discuss any questions you have with your health care provider.   Document Released: 03/13/2004 Document Revised: 02/24/2014 Document Reviewed: 09/28/2013 Elsevier Interactive Patient Education [employer].

## 2014-12-07 NOTE — ED Notes (Signed)
Reviewed discharge instructions and prescription medications with patient and patient's daughter.  Patient's daughter has own transport with oxygen on the way, will continue to monitor patient until transport arrives.

## 2014-12-07 NOTE — ED Provider Notes (Signed)
CSN: [id number]     Arrival date & time 12/07/14  3557 History   First MD Initiated Contact with Patient 12/07/14 [phone]     Chief Complaint  Patient presents with  . GI Bleeding     (Consider location/radiation/quality/duration/timing/severity/associated sxs/prior Treatment) HPI Patient is on palliative care through hospice. Discharged one month ago given 2-4 weeks survival. Discharged to hospice facility and he was discharged 2 days ago to home. EMS called by family. Patient with nausea and several episodes of emesis. Unable to tolerate meds. One episode of vomiting had bright red blood mixed in. Patient is also had some tarry stools per caretaker. Patient states she is having some upper abdominal pain. She suffers from constipation. She has baseline shortness of breath is on 2 L of home O2. Past Medical History  Diagnosis Date  . PVD (peripheral vascular disease) ([place])     a. PTA & stent right superficial artery PTA & left common iliac artery stenting. Right fem-pop bypass graft. b. Per Dr. Kennon Holter note: h/o aortobifemoral bypass grafting remotely as well as fem-peroneal bypass grafting.  . Mixed hyperlipidemia   . Compression fx, thoracic spine (HCC)     T12  . Ischium fracture ([place])   . Memory loss, short term   . Pulmonary nodule   . Chronic diastolic CHF (congestive heart failure) ([place]) 09-2008    a. EF 40% by cath 2010. b. Improved - last echo 2015 with normal EF.  [name] Anemia   . Detached retina 02-2005    R EYE  . Fracture 02-2007    L5 AND PELVIS  . Left ventricular dysfunction     a. EF 40% by cath 2010. b. Improved - last echo 2015 with normal EF.   [name] AAA (abdominal aortic aneurysm) ([place])     Repair unknown date  . Critical lower limb ischemia   . Chronic respiratory failure ([place])     a. On home O2.  [name] Hypertension   . Warfarin anticoagulation     DC summary from 2010 reports she is "on chronic  Coumadin for vascular disease." This is listed on her med list going back  to 2006 by records available. Teaching service H&P remotely says she has h/o DVT but I cannot find any evidence of this. Admission H/P 03/2013 suggests history of atrial fib but all ECGs appear sinus and there is no mention of this previously.  [name] COPD (chronic obstructive pulmonary disease) ([place])   . Type II or unspecified type diabetes mellitus without mention of complication, not stated as uncontrolled   . Complication of anesthesia     hard to wake up, bp problems   . Pneumonia     hx  . GERD (gastroesophageal reflux disease)   . Arthritis   . CAD (coronary artery disease) 10/12/2008    a. 30% LAD,30% CX & 40% tandem mid stenosis in the AV groove, 50-60% RCA  . Renal artery stenosis (HCC)     a. 1-59% by doppler in 2014.; CKD stage III (Dr. Erling Cruz)  . On home oxygen therapy     "2.5L O2 at sleep or w/increased activity" (09/27/2013)  . History of right above knee amputation [facility])     performed by Dr. Gae Gallop August 2014  . Pleural effusion 09/2014  . Shortness of breath dyspnea   . UTI (urinary tract infection)   . PAF (paroxysmal atrial fibrillation) ([place]) 10/19/2014   Past Surgical History  Procedure Laterality Date  . Abdominal aortic  aneurysm repair  98    "lower left ventricle"  . Aorto-femoral bypass graft Bilateral     S/P aortobifemoral bypass grafting and right fem-peroneal bypass grafting remotely  . Cholecystectomy    . Cataract extraction Right 06-2006  . Vascular surgery    . Cardiac catheterization    . Retinal detachment surgery Right     "put buckle in; it popped & they had to remove it"  . Eye surgery    . Patch angioplasty Left 04/19/2013    Procedure: VEIN PATCH ANGIOPLASTY - LEFT COMMON FEMORAL ARTERY;  Surgeon: Angelia Mould, MD;  Location: [facility];  Service: Vascular;  Laterality: Left;  . Femoral-popliteal bypass graft Left 04/19/2013    Procedure: BYPASS GRAFT FEMORAL- BELOW KNEE POPLITEAL ARTERY WITH VEIN;  Surgeon: Angelia Mould, MD;   Location: [place];  Service: Vascular;  Laterality: Left;  . Fracture surgery      sternum  2008 no surgery  . Above knee leg amputation Right 09/27/2013  . Femoral-peroneal bypass graft    . Aorto-femoral bypass graft    . Patella fracture surgery Right ~ 1987    "shattered; work related injury"  . Total abdominal hysterectomy    . Amputation Right 09/27/2013    Procedure: AMPUTATION ABOVE KNEE-RIGHT;  Surgeon: Angelia Mould, MD;  Location: [facility];  Service: Vascular;  Laterality: Right;  . Lower extremity angiogram Left 04/04/2013    Procedure: LOWER EXTREMITY ANGIOGRAM;  Surgeon: Lorretta Harp, MD;  Location: [facility] CATH LAB;  Service: Cardiovascular;  Laterality: Left;   Family History  Problem Relation Age of Onset  . Tuberculosis Mother   . Cancer Father    Social History  Substance Use Topics  . Smoking status: Former Smoker -- 2.00 packs/day for 40 years    Types: Cigarettes    Quit date: 02/16/1997  . Smokeless tobacco: Never Used  . Alcohol Use: No   OB History    No data available     Review of Systems  Respiratory: Positive for shortness of breath.   Gastrointestinal: Positive for nausea, vomiting, abdominal pain and constipation.  Musculoskeletal: Negative for back pain.  Skin: Negative for rash and wound.  Neurological: Negative for weakness and numbness.  All other systems reviewed and are negative.     Allergies  Codeine; Hydrochlorothiazide; Pregabalin; and Other  Home Medications   Prior to Admission medications   Medication Sig Start Date End Date Taking? Authorizing Provider  acetaminophen (TYLENOL) 325 MG tablet Take 650 mg by mouth every 4 (four) hours as needed for fever.   Yes Historical Provider, MD  albuterol (PROVENTIL) (2.5 MG/3ML) 0.083% nebulizer solution Take 2.5 mg by nebulization every 4 (four) hours as needed for wheezing or shortness of breath.    Yes Historical Provider, MD  carvedilol (COREG) 3.125 MG tablet Take 3 tablets  (9.375 mg total) by mouth 2 (two) times daily with a meal. 10/24/14  Yes Sela Hua, MD  dexamethasone (DECADRON) 4 MG tablet Take 8 mg by mouth daily.   Yes Historical Provider, MD  furosemide (LASIX) 40 MG tablet Take 1 tablet (40 mg total) by mouth daily. 12/16/13  Yes Darlyne Russian, MD  guaiFENesin (MUCINEX) 600 MG 12 hr tablet Take 600 mg by mouth 2 (two) times daily as needed for cough.   Yes Historical Provider, MD  haloperidol (HALDOL) 1 MG tablet Take 0.5-1 mg by mouth every 4 (four) hours as needed for agitation.   Yes Historical  Provider, MD  HYDROmorphone (DILAUDID) 2 MG tablet Take 0.5-1 mg by mouth every 2 (two) hours as needed for severe pain.   Yes Historical Provider, MD  insulin glargine (LANTUS) 100 UNIT/ML injection Inject 12 Units into the skin daily at 12 noon. At noon   Yes Historical Provider, MD  ipratropium-albuterol (DUONEB) 0.5-2.5 (3) MG/3ML SOLN Take 3 mLs by nebulization 4 (four) times daily.   Yes Historical Provider, MD  LORazepam (ATIVAN) 0.5 MG tablet Take 1 tablet (0.5 mg total) by mouth every 4 (four) hours as needed for anxiety. Patient taking differently: Take 0.25-0.5 mg by mouth See admin instructions. Take 1/2 tablet at 8 am and 2 pm then take 1 tablet at bedtime.  May take 1/2 to 1 tablet every 2 hours if needed for anxiety 10/24/14  Yes Sela Hua, MD  morphine (MS CONTIN) 15 MG 12 hr tablet Take 15 mg by mouth every 12 (twelve) hours.   Yes Historical Provider, MD  omeprazole (PRILOSEC) 20 MG capsule Take 20 mg by mouth 2 (two) times daily before a meal.   Yes Historical Provider, MD  bisacodyl (DULCOLAX) 10 MG suppository Place 1 suppository (10 mg total) rectally daily as needed for moderate constipation. Patient not taking: Reported on 12/07/2014 10/24/14   Sela Hua, MD  budesonide-formoterol [facility]) 160-4.5 MCG/ACT inhaler Inhale 2 puffs into the lungs 2 (two) times daily. Patient not taking: Reported on 12/07/2014 06/29/14   Darlyne Russian,  MD  dicyclomine (BENTYL) 20 MG tablet Take 1 tablet (20 mg total) by mouth 2 (two) times daily as needed for spasms. 12/07/14   Julianne Rice, MD  esomeprazole (NEXIUM) 40 MG capsule Take 1 capsule (40 mg total) by mouth daily before breakfast. Patient not taking: Reported on 12/07/2014 12/16/13   Darlyne Russian, MD  Ipratropium-Albuterol (COMBIVENT RESPIMAT) 20-100 MCG/ACT AERS respimat Inhale 2 puffs into the lungs every 6 (six) hours as needed for wheezing or shortness of breath. Patient not taking: Reported on 12/07/2014 12/16/13   Darlyne Russian, MD  lidocaine (LIDODERM) 5 % Place 1 patch onto the skin daily. Remove & Discard patch within 12 hours or as directed by MD Patient not taking: Reported on 12/07/2014 06/29/14   Darlyne Russian, MD  ondansetron (ZOFRAN ODT) 4 MG disintegrating tablet '4mg'$  ODT q4 hours prn nausea/vomit 12/07/14   Julianne Rice, MD  oxyCODONE (OXY IR/ROXICODONE) 5 MG immediate release tablet Take 1 tablet (5 mg total) by mouth every 4 (four) hours as needed for moderate pain (dyspnea). Patient not taking: Reported on 12/07/2014 10/24/14   Sela Hua, MD  ranitidine (ZANTAC) 150 MG tablet Take 1 tablet (150 mg total) by mouth 2 (two) times daily. 12/07/14   Julianne Rice, MD  simethicone (GAS-X) 80 MG chewable tablet Chew 1 tablet (80 mg total) by mouth every 6 (six) hours as needed for flatulence. 12/07/14   Julianne Rice, MD   BP 141/60 mmHg  Pulse 75  Temp(Src) 98 F (36.7 C) (Oral)  Resp 14  Ht '5\' 2"'$  (1.575 m)  Wt 120 lb (54.432 kg)  BMI 21.94 kg/m2  SpO2 100% Physical Exam  Constitutional: She is oriented to person, place, and time. She appears well-developed and well-nourished. No distress.  HENT:  Head: Normocephalic and atraumatic.  Mouth/Throat: Oropharynx is clear and moist.  Eyes: EOM are normal. Pupils are equal, round, and reactive to light.  Neck: Normal range of motion. Neck supple.  Cardiovascular: Normal rate and regular rhythm.  Pulmonary/Chest: No respiratory distress. She has no wheezes. She has rales.  Increased respiratory effort. Decreased breath sounds on the left. Rhonchi present.  Abdominal: Soft. Bowel sounds are normal. She exhibits distension. She exhibits no mass. There is tenderness (mild epigastric tenderness to palpation.). There is no rebound and no guarding.  Musculoskeletal: Normal range of motion. She exhibits no edema or tenderness.  Right lower extremity and amputation  Neurological: She is alert and oriented to person, place, and time.  Skin: Skin is warm and dry. No rash noted. No erythema.  Psychiatric: She has a normal mood and affect. Her behavior is normal.  Nursing note and vitals reviewed.   ED Course  Procedures (including critical care time) Labs Review Labs Reviewed  COMPREHENSIVE METABOLIC PANEL - Abnormal; Notable for the following:    Chloride 98 (*)    Glucose, Bld 257 (*)    BUN 39 (*)    Creatinine, Ser 1.72 (*)    Total Protein 5.8 (*)    Albumin 2.8 (*)    GFR calc non Af Amer 25 (*)    GFR calc Af Amer 29 (*)    All other components within normal limits  CBC - Abnormal; Notable for the following:    WBC 11.6 (*)    RBC 3.83 (*)    Hemoglobin 11.0 (*)    HCT 35.8 (*)    All other components within normal limits  POC OCCULT BLOOD, ED - Abnormal; Notable for the following:    Fecal Occult Bld POSITIVE (*)    All other components within normal limits  TYPE AND SCREEN    Imaging Review Dg Chest 1 View  12/07/2014  CLINICAL DATA:  Tarry stools and emesis. History of LEFT lung collapse, COPD. EXAM: CHEST 1 VIEW COMPARISON:  Chest radiograph October 17, 2014 and CT chest October 17, 2014 FINDINGS: Moderate layering LEFT pleural effusion, similar. Calcified LEFT apical pleural plaques. Moderate cardiomegaly and pulmonary vascular congestion. No pneumothorax. Vascular calcifications in the axilla. Moderate chronic T12 compression fracture. IMPRESSION: Similar  cardiomegaly, pulmonary vascular congestion. Similar moderate LEFT pleural effusion and underlying volume loss. Electronically Signed   By: Elon Alas M.D.   On: 12/07/2014 06:53   Dg Abd 1 View  12/07/2014  CLINICAL DATA:  Tarry emesis and stools.  Vomiting.  On hospice. EXAM: ABDOMEN - 1 VIEW COMPARISON:  CT abdomen and pelvis October 19, 2014 FINDINGS: Gas-filled nondistended small and large bowel. No intra-abdominal mass effect or pathologic calcifications. Surgical clips in the included right abdomen compatible with cholecystectomy. Surgical clips in the inguinal canal scanned paddle bubble with prior vascular access. Punctate calcifications in mid abdomen correspond to known pancreatic calcifications. Broad dextroscoliosis and degenerative change of the lumbar spine. Moderate T12 compression fracture, unchanged. IMPRESSION: Nonspecific bowel gas pattern. Electronically Signed   By: Elon Alas M.D.   On: 12/07/2014 06:50   I have personally reviewed and evaluated these images and lab results as part of my medical decision-making.   EKG Interpretation   Date/Time:  Thursday December 07 2014 05:30:36 EDT Ventricular Rate:  74 PR Interval:  147 QRS Duration: 131 QT Interval:  392 QTC Calculation: 435 R Axis:   -35 Text Interpretation:  Sinus rhythm Multiple premature complexes, vent &  supraven Left bundle branch block Artifact in lead(s) I II III aVR aVL aVF  V2 V4 V5 ED PHYSICIAN INTERPRETATION AVAILABLE IN [facility]  Confirmed by TEST, Record ([id number]) on 12/13/14 7:22:21 AM  MDM   Final diagnoses:  Non-intractable vomiting with nausea, vomiting of unspecified type  Gastrointestinal hemorrhage, unspecified gastritis, unspecified gastrointestinal hemorrhage type   Patient states her nausea and pain is improved. Vital signs are stable in the emergency department on 2 L of oxygen. KUB reveals bowels filled with gas. Patient does have guaiac-positive result but  a stable hemoglobin. Do not believe that further workup is necessary. Discussed with family. Will add on Simethicon, Bentyl and Zofran to the patient's medical regimen. Advised to discontinue laxatives.     Julianne Rice, MD 12/09/14 [phone]

## 2014-12-07 NOTE — ED Notes (Signed)
MD at bedside. 

## 2014-12-07 NOTE — ED Notes (Addendum)
PER EMS: Patient is on hospice (respiratory in nature) since September, coming from home.  EMS called out because patient has had 1 black tarry stool and black, tarry emesis since this 2100 last night.  Patient has collapsed L lung (normal for her) and only c/o pain to sores on bottom and heel at this time.  EMS VS: 86 NSR, 152/70, 95% 2L O2 [place] (on 2L all the time), CBG 231.  Patient A&O per baseline.

## 2014-12-14 ENCOUNTER — Telehealth: Payer: Self-pay | Admitting: Cardiovascular Disease

## 2014-12-14 NOTE — Telephone Encounter (Signed)
She wanted you to know Skie passed away on Dec 22, 2014. Please tell Dr Gwenlyn Found and to thank him for everything.

## 2014-12-14 NOTE — Telephone Encounter (Signed)
Please have a condolence note for me to sign on Tuesday. She was a long time favorite patient and I know her daughter [name]  JJB

## 2014-12-19 DEATH — deceased

## 2015-03-23 IMAGING — CR DG CHEST 2V
3 series · 3 of 3 positions shown · non-contrast
Comparison: PA and lateral chest 11/16/2012.  CT chest 12/27/2009.

CLINICAL DATA: Cough and congestion.

EXAM:
CHEST  2 VIEW

[PA (1 of 2)]
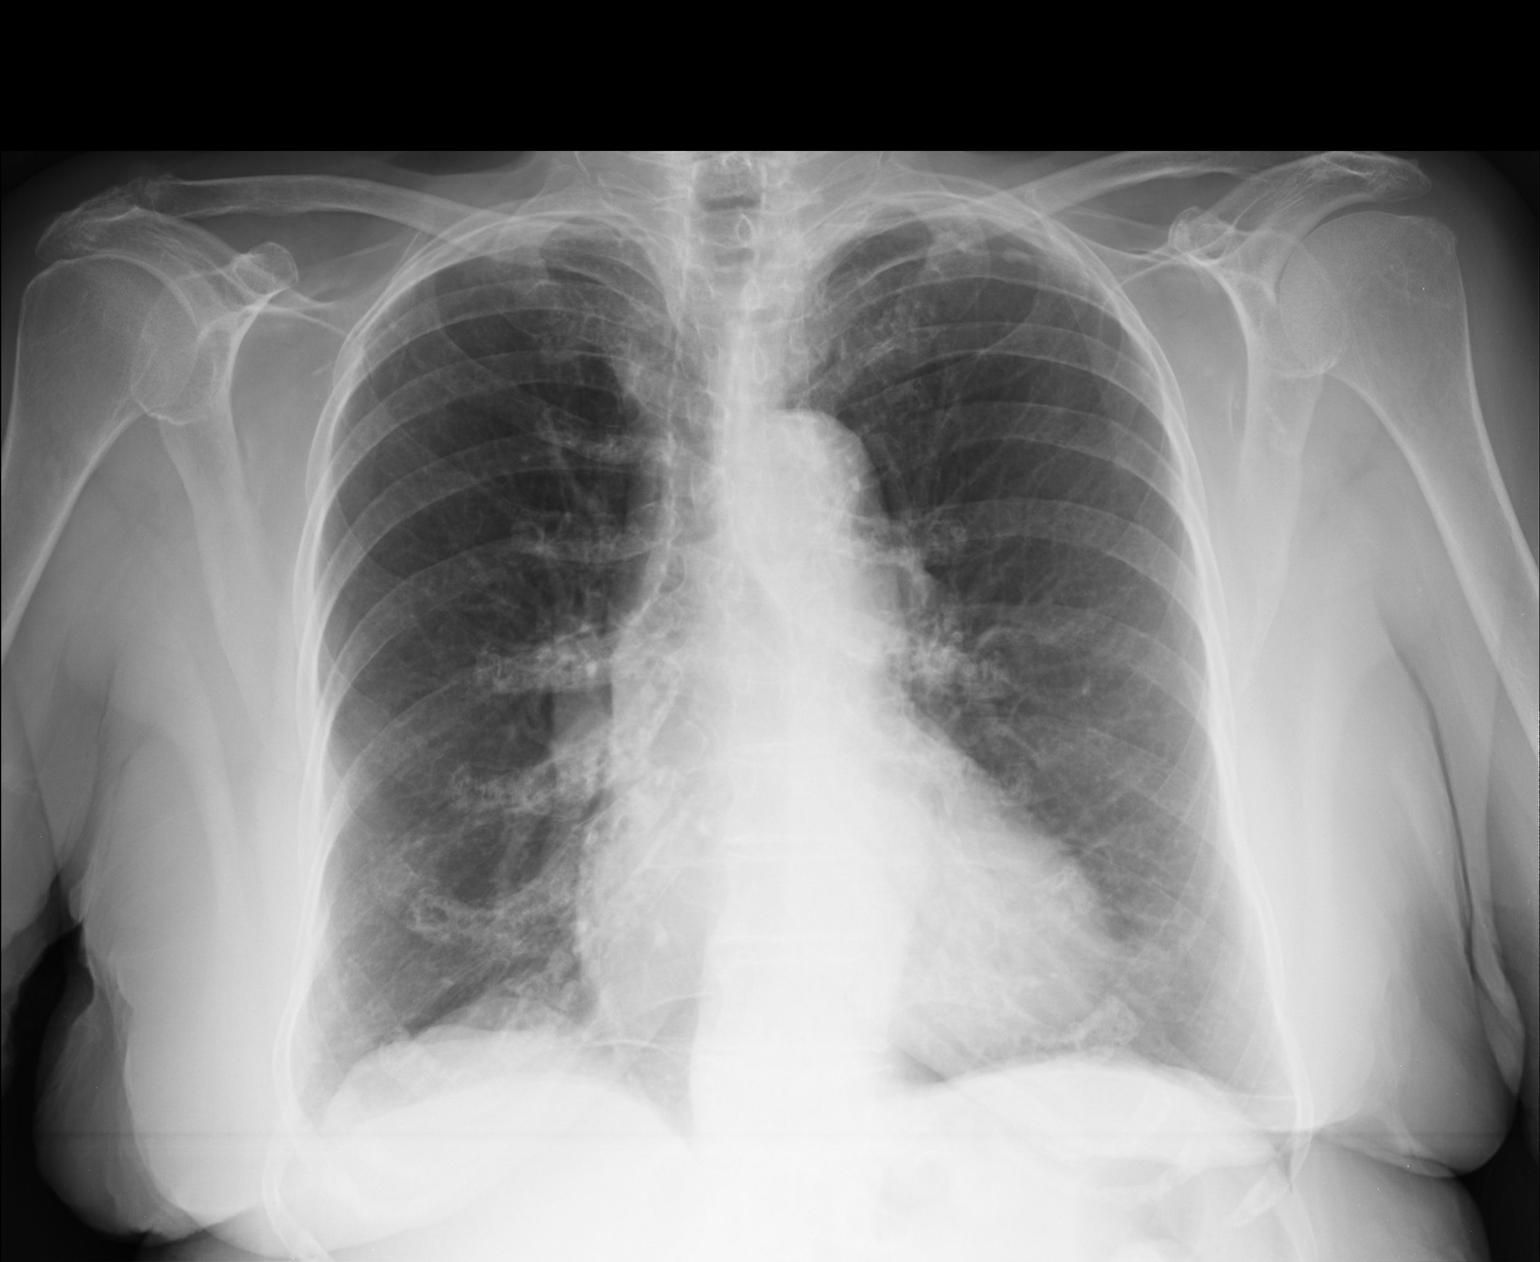

[lateral]
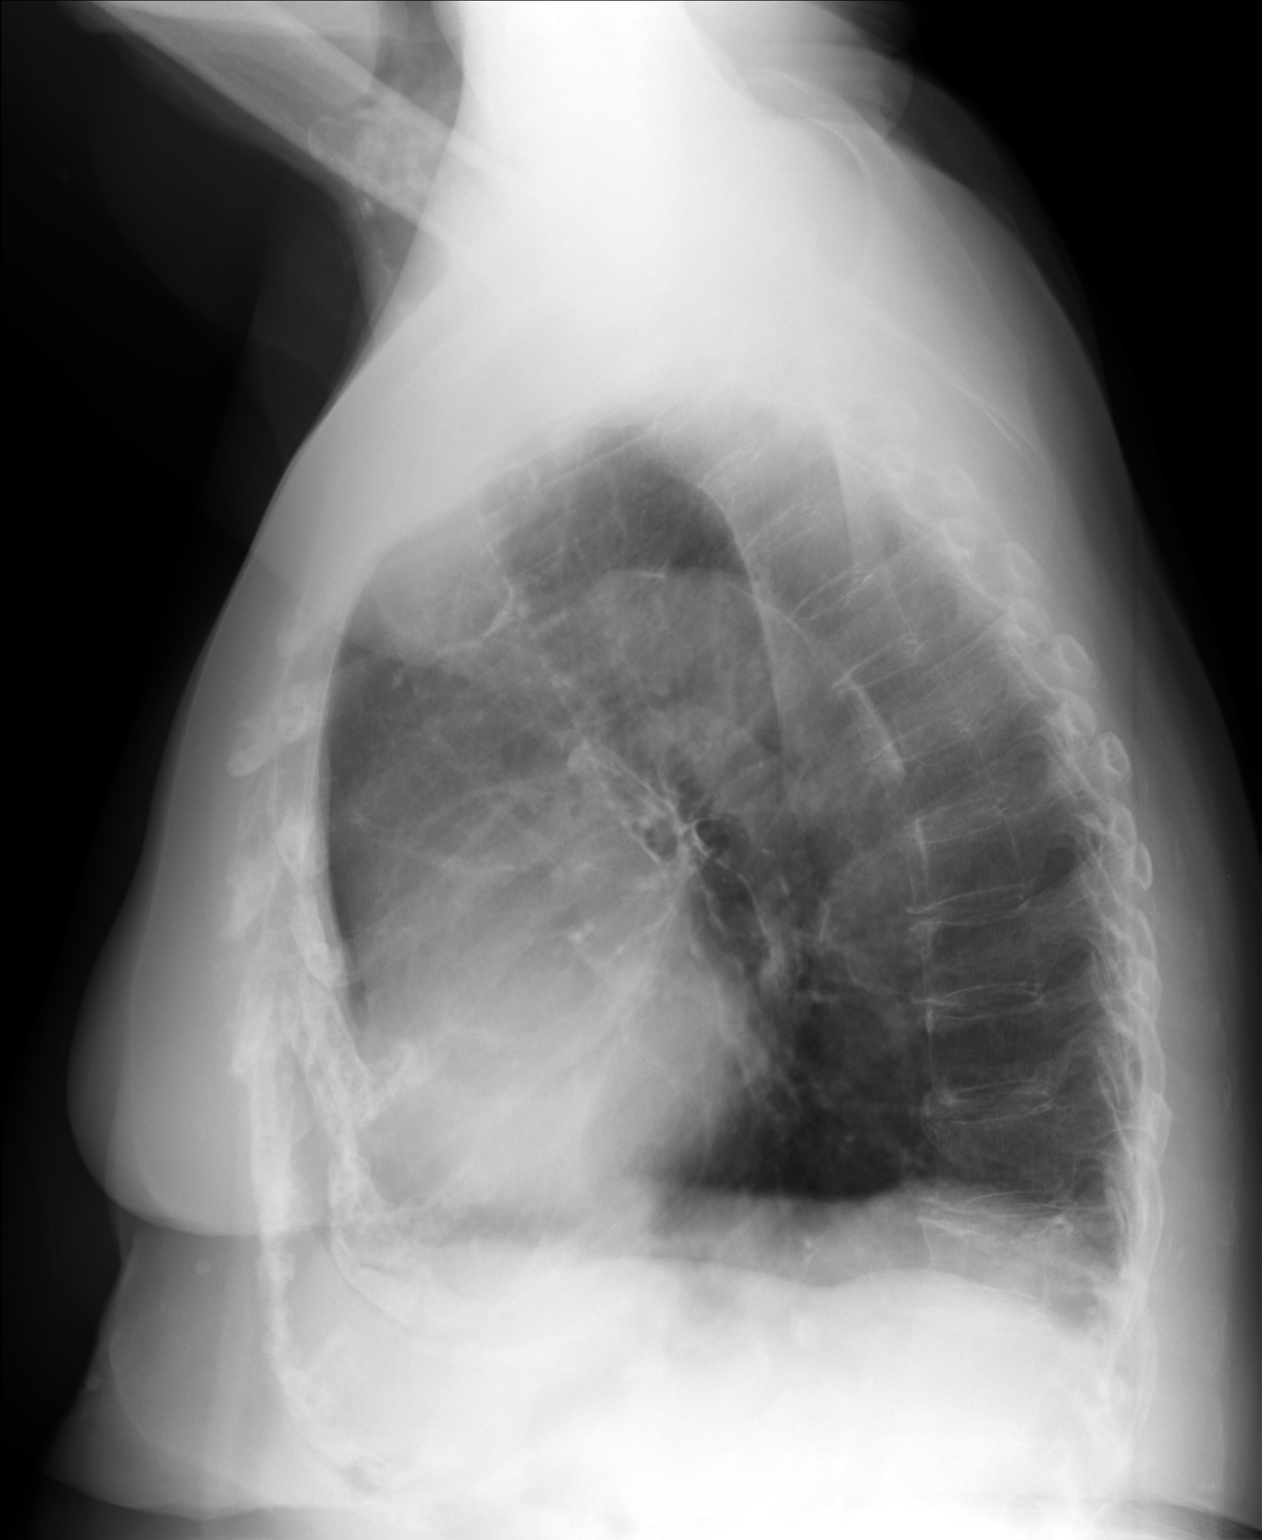

[PA (2 of 2)]
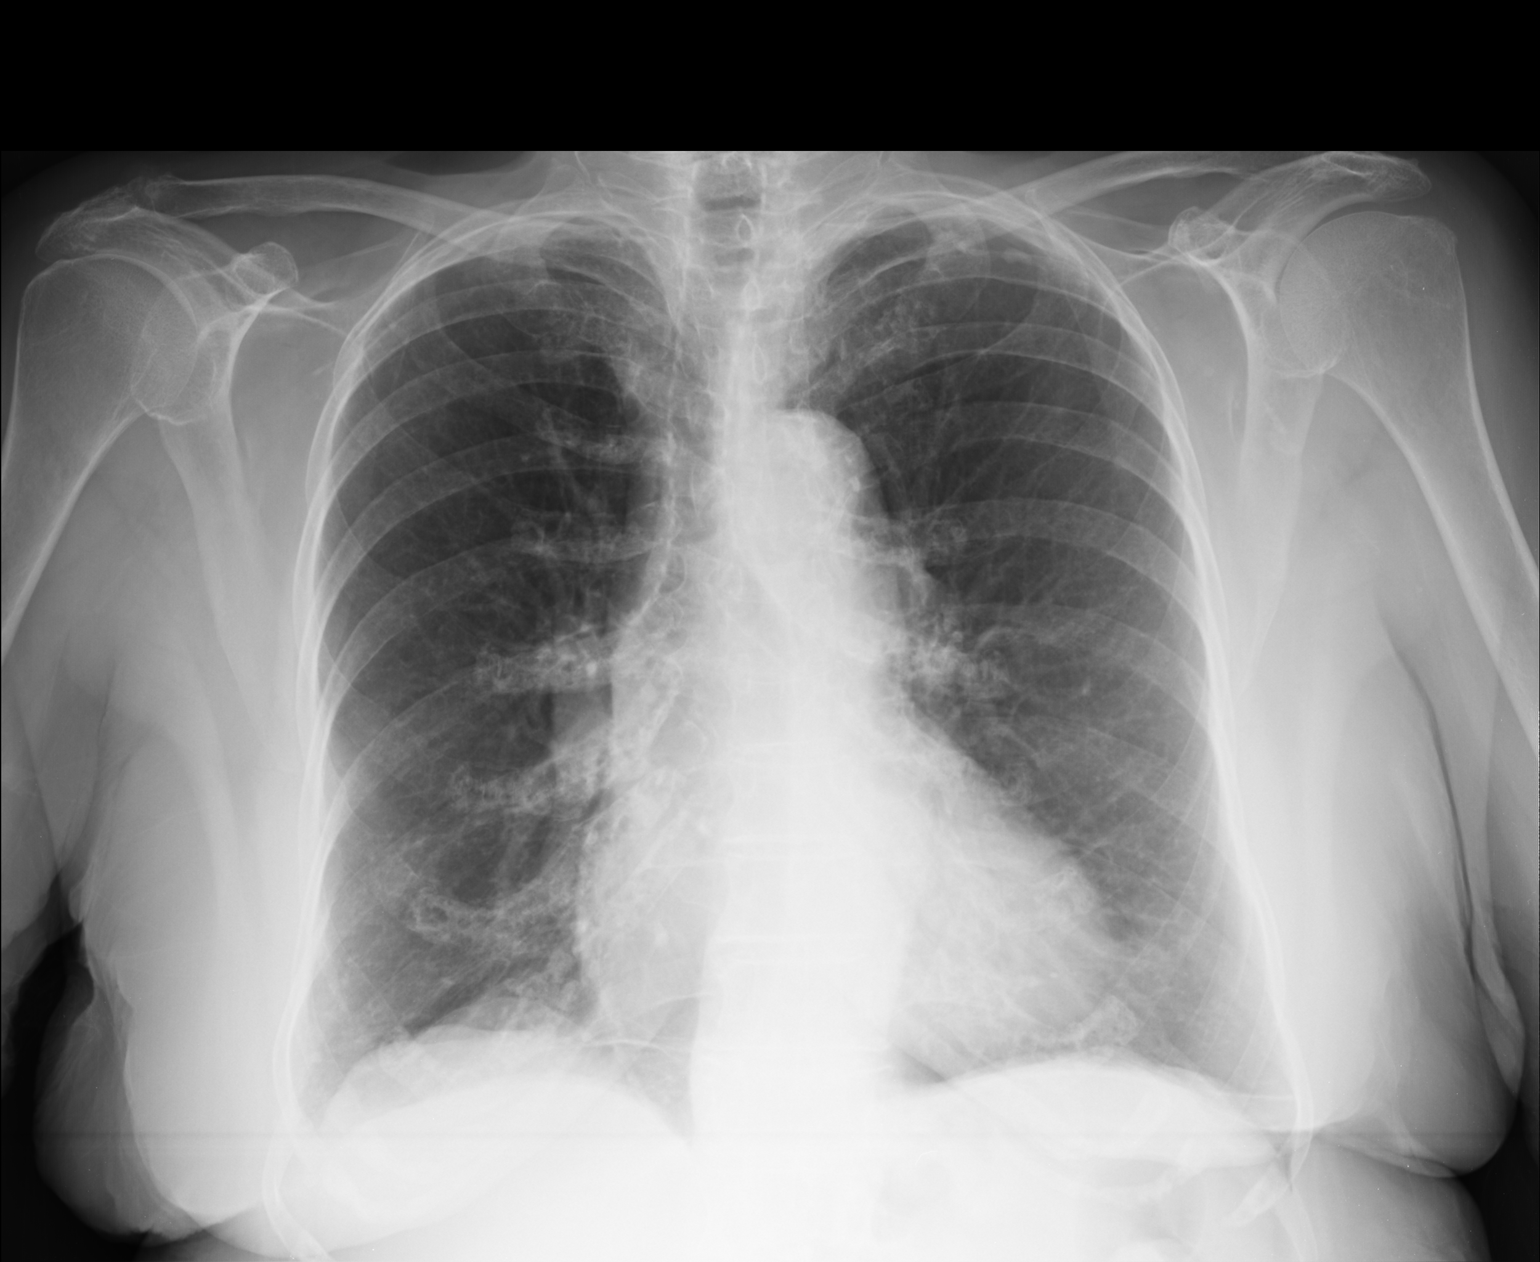

[3 of 3 positions shown; findings below may reference images not displayed]

FINDINGS: The lungs are emphysematous but clear. There is cardiomegaly. No
pneumothorax or pleural fluid. Remote compression fracture
thoracolumbar junction noted.
IMPRESSION: No acute disease.

Emphysema.

Cardiomegaly.

## 2015-04-04 ENCOUNTER — Encounter: Payer: Self-pay | Admitting: Cardiology

## 2015-11-04 IMAGING — CR DG CHEST 2V
2 series · 2 of 2 positions shown · non-contrast
Comparison: 04/19/2013

CLINICAL DATA: Chronic shortness of breath. Hypertension and
diabetes

EXAM:
CHEST  2 VIEW

[w chest pa]
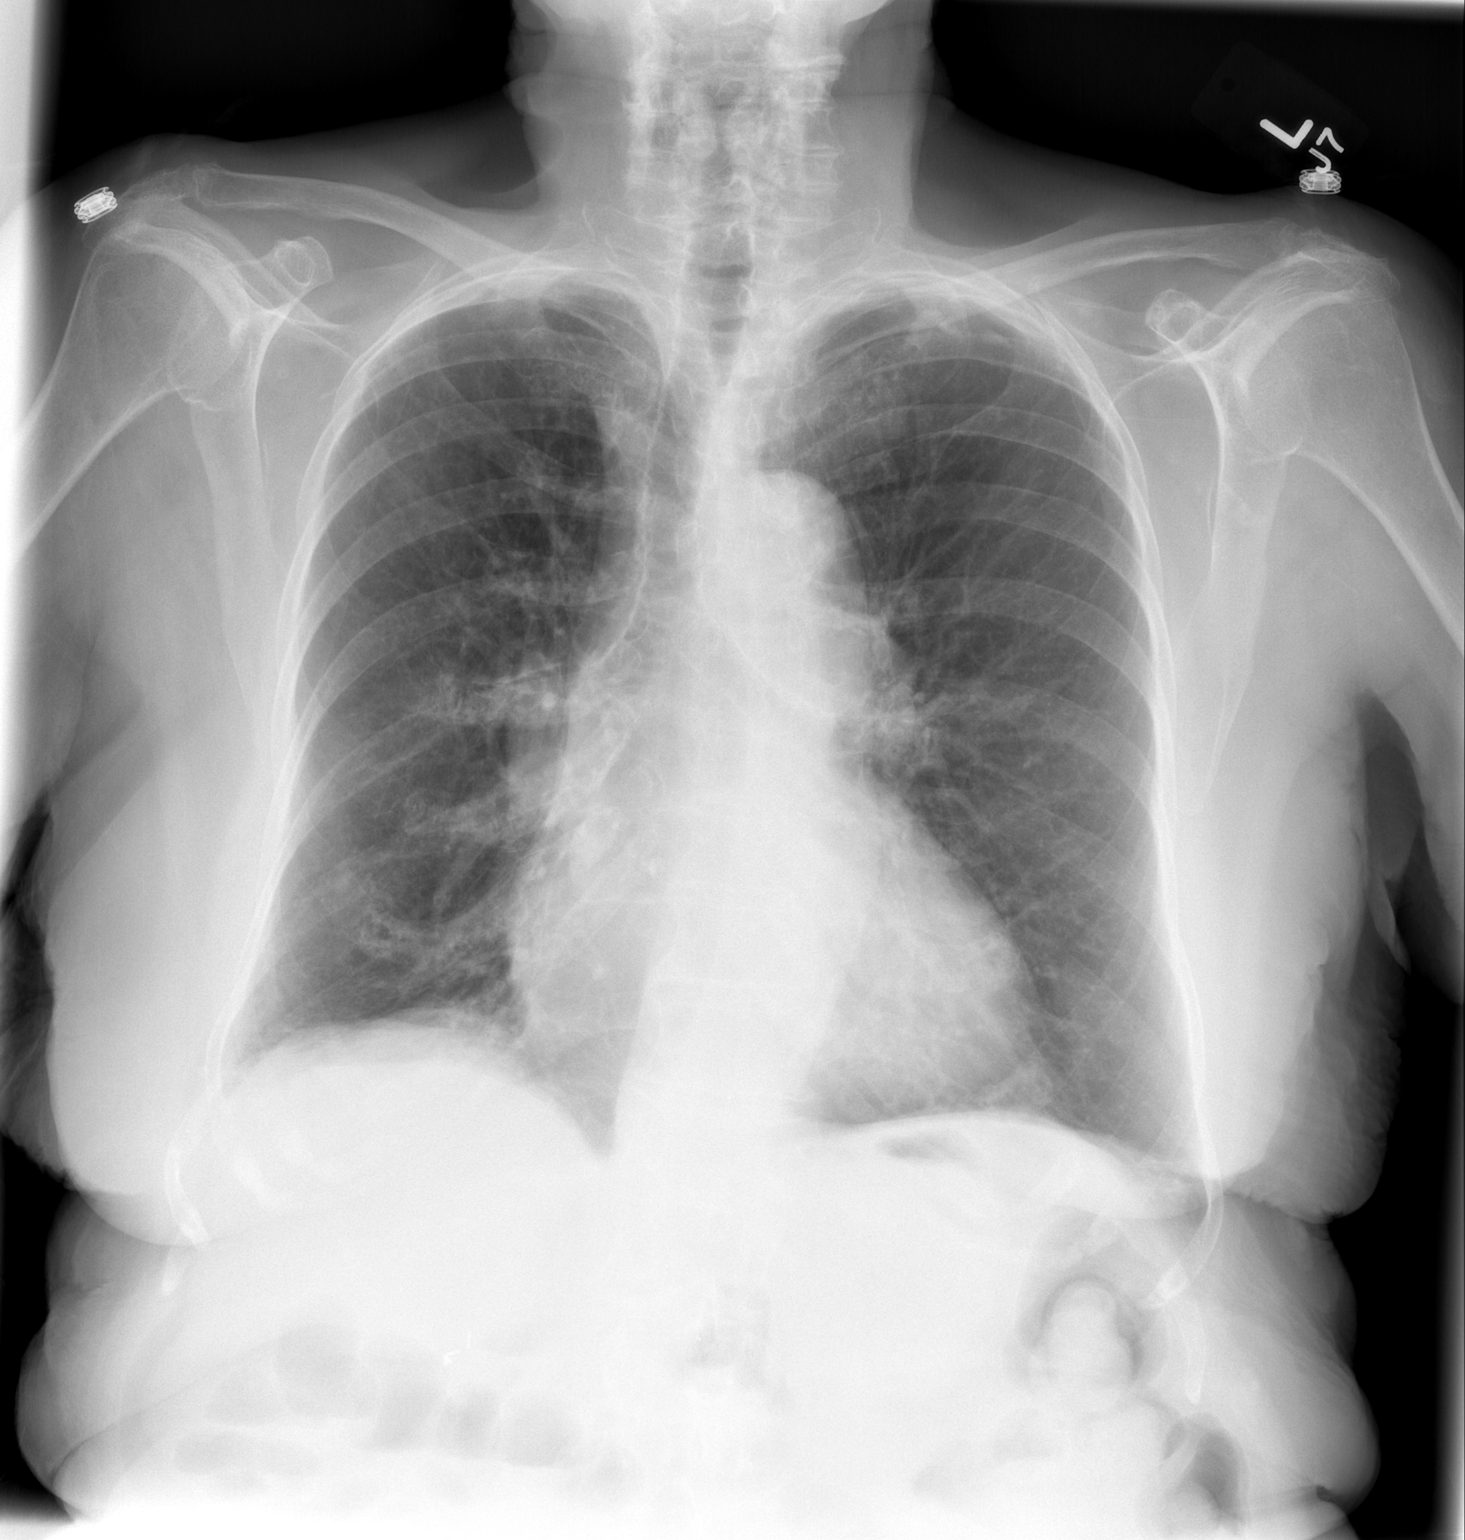

[w chest lat]
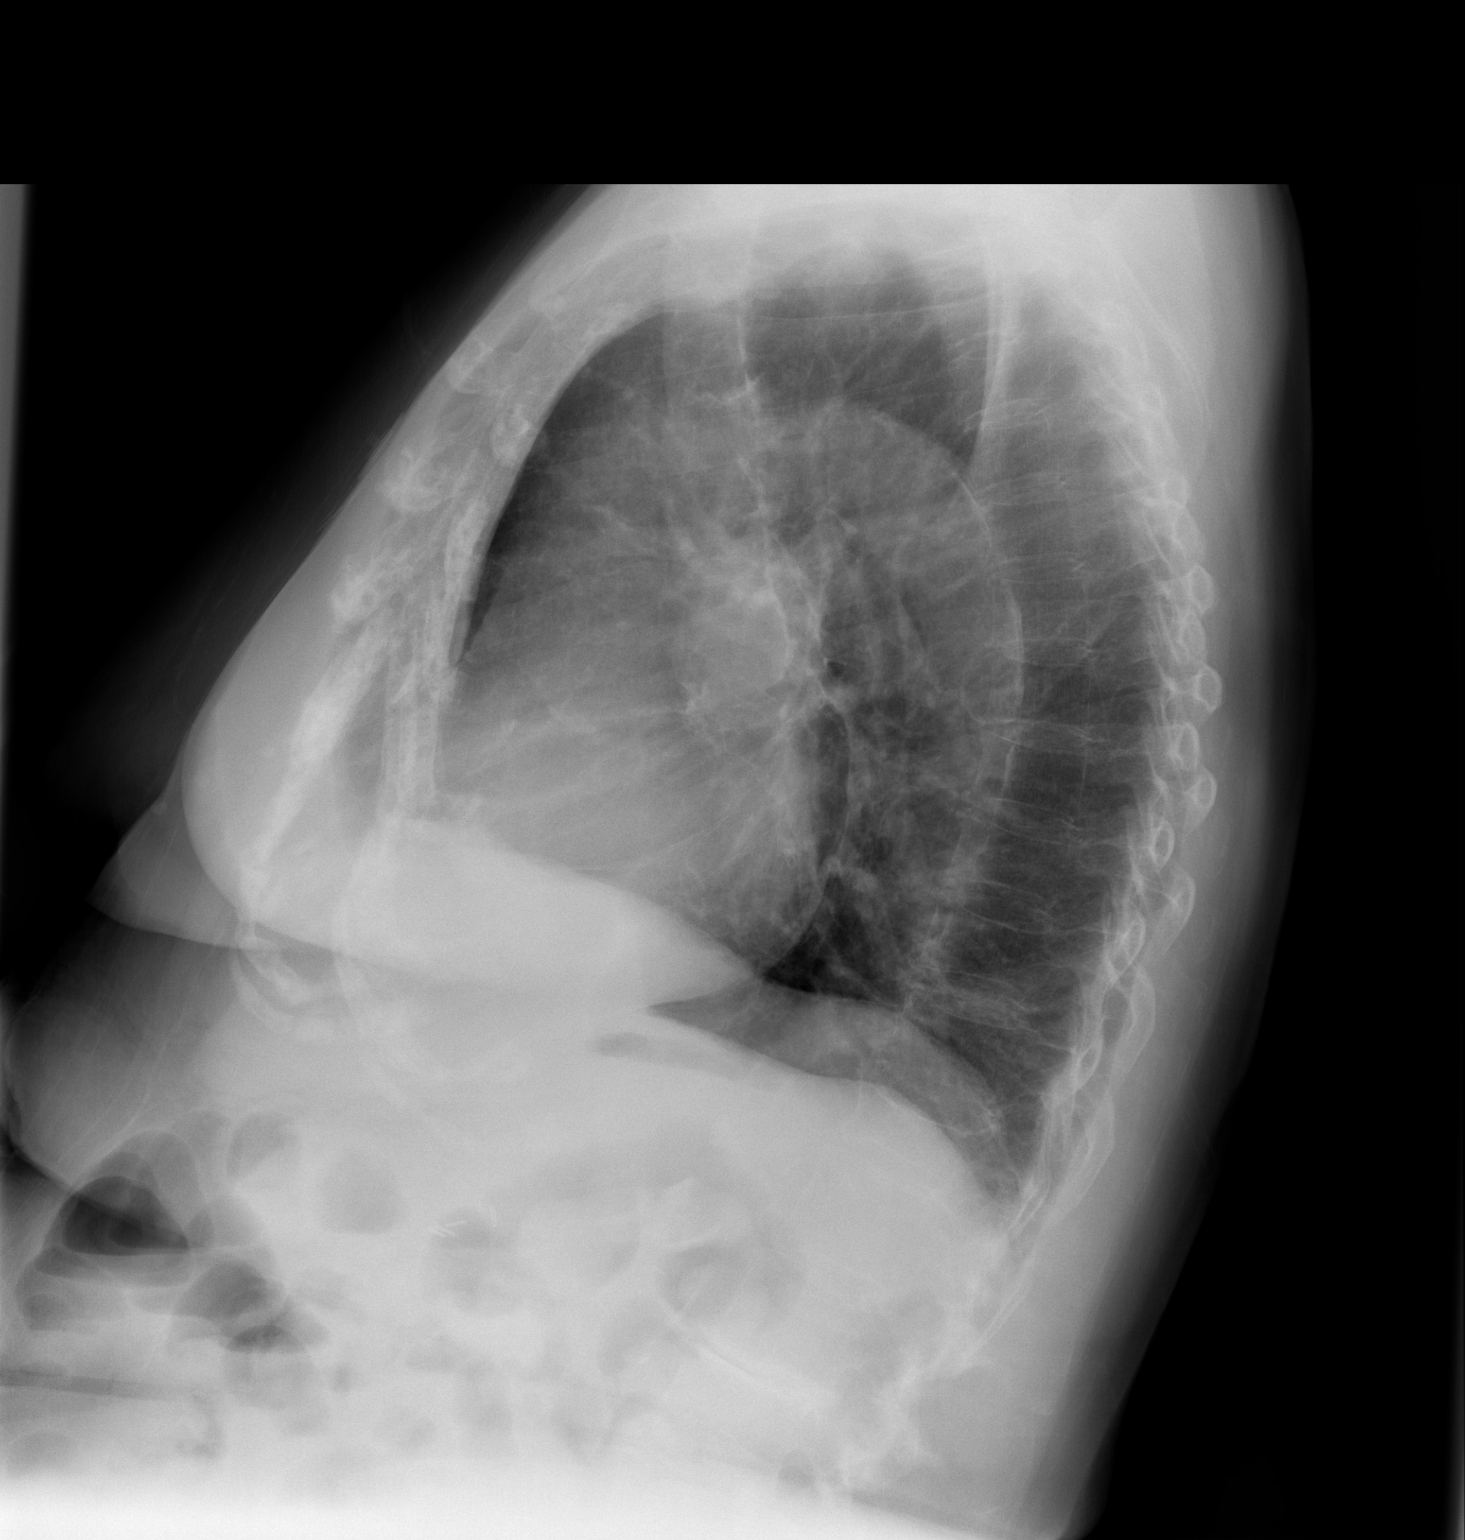

[2 of 2 positions shown; findings below may reference images not displayed]

FINDINGS: The heart size and mediastinal contours are within normal limits.
Calcified atherosclerotic disease involves the thoracic aorta. Both
lungs are clear. Again noted is a lower thoracic spine compression
fracture.
IMPRESSION: No active cardiopulmonary disease.
